# Patient Record
Sex: Male | Born: 1960 | Race: Black or African American | Hispanic: No | Smoking: Current every day smoker
Health system: Southern US, Community
[De-identification: ages and names within clinical notes are randomized; demographics above are authoritative.]

## PROBLEM LIST (undated history)

## (undated) DIAGNOSIS — J449 Chronic obstructive pulmonary disease, unspecified: Secondary | ICD-10-CM

## (undated) DIAGNOSIS — R000 Tachycardia, unspecified: Secondary | ICD-10-CM

## (undated) DIAGNOSIS — I10 Essential (primary) hypertension: Secondary | ICD-10-CM

## (undated) DIAGNOSIS — J189 Pneumonia, unspecified organism: Secondary | ICD-10-CM

## (undated) DIAGNOSIS — M539 Dorsopathy, unspecified: Secondary | ICD-10-CM

## (undated) DIAGNOSIS — K219 Gastro-esophageal reflux disease without esophagitis: Secondary | ICD-10-CM

## (undated) DIAGNOSIS — Z972 Presence of dental prosthetic device (complete) (partial): Secondary | ICD-10-CM

## (undated) DIAGNOSIS — G4730 Sleep apnea, unspecified: Secondary | ICD-10-CM

## (undated) DIAGNOSIS — C61 Malignant neoplasm of prostate: Secondary | ICD-10-CM

## (undated) DIAGNOSIS — N529 Male erectile dysfunction, unspecified: Secondary | ICD-10-CM

## (undated) DIAGNOSIS — M109 Gout, unspecified: Secondary | ICD-10-CM

## (undated) HISTORY — DX: Male erectile dysfunction, unspecified: N52.9

## (undated) HISTORY — DX: Malignant neoplasm of prostate: C61

## (undated) HISTORY — DX: Pneumonia, unspecified organism: J18.9

## (undated) HISTORY — DX: Chronic obstructive pulmonary disease, unspecified: J44.9

## (undated) MED FILL — Dexamethasone Sodium Phosphate Inj 100 MG/10ML: INTRAMUSCULAR | Qty: 1 | Status: AC

---

## 2007-07-24 HISTORY — PX: ABCESS DRAINAGE: SHX399

## 2008-05-04 ENCOUNTER — Emergency Department (HOSPITAL_COMMUNITY): Admission: EM | Admit: 2008-05-04 | Discharge: 2008-05-04 | Payer: Self-pay | Admitting: Emergency Medicine

## 2011-03-16 ENCOUNTER — Ambulatory Visit: Payer: Self-pay

## 2011-05-28 ENCOUNTER — Inpatient Hospital Stay: Payer: Self-pay | Admitting: Internal Medicine

## 2011-05-31 ENCOUNTER — Emergency Department: Payer: Self-pay | Admitting: *Deleted

## 2011-06-18 ENCOUNTER — Ambulatory Visit: Payer: Self-pay | Admitting: "Endocrinology

## 2011-09-06 ENCOUNTER — Emergency Department: Payer: Self-pay | Admitting: Emergency Medicine

## 2012-02-06 ENCOUNTER — Emergency Department: Payer: Self-pay | Admitting: Emergency Medicine

## 2012-10-03 ENCOUNTER — Emergency Department: Payer: Self-pay | Admitting: Emergency Medicine

## 2012-10-03 LAB — BASIC METABOLIC PANEL
Chloride: 106 mmol/L (ref 98–107)
Co2: 27 mmol/L (ref 21–32)
Osmolality: 277 (ref 275–301)
Sodium: 138 mmol/L (ref 136–145)

## 2012-10-03 LAB — URINALYSIS, COMPLETE
Bilirubin,UR: NEGATIVE
Nitrite: NEGATIVE
WBC UR: 13 /HPF (ref 0–5)

## 2012-10-03 LAB — CBC
HCT: 37.4 % — ABNORMAL LOW (ref 40.0–52.0)
HGB: 12.4 g/dL — ABNORMAL LOW (ref 13.0–18.0)
MCH: 25.9 pg — ABNORMAL LOW (ref 26.0–34.0)
RBC: 4.79 10*6/uL (ref 4.40–5.90)
RDW: 15.7 % — ABNORMAL HIGH (ref 11.5–14.5)

## 2012-10-03 LAB — LIPASE, BLOOD: Lipase: 149 U/L (ref 73–393)

## 2013-05-13 ENCOUNTER — Emergency Department: Payer: Self-pay | Admitting: Emergency Medicine

## 2013-05-13 LAB — URINALYSIS, COMPLETE
Bacteria: NONE SEEN
Glucose,UR: NEGATIVE mg/dL (ref 0–75)
Leukocyte Esterase: NEGATIVE
Ph: 5 (ref 4.5–8.0)
Protein: NEGATIVE
RBC,UR: 1 /HPF (ref 0–5)
WBC UR: NONE SEEN /HPF (ref 0–5)

## 2013-06-17 ENCOUNTER — Emergency Department: Payer: Self-pay | Admitting: Emergency Medicine

## 2013-06-17 LAB — CBC WITH DIFFERENTIAL/PLATELET
Eosinophil #: 0.2 10*3/uL (ref 0.0–0.7)
Eosinophil %: 1.9 %
HGB: 12.1 g/dL — ABNORMAL LOW (ref 13.0–18.0)
Lymphocyte #: 2.5 10*3/uL (ref 1.0–3.6)
Lymphocyte %: 25 %
MCV: 80 fL (ref 80–100)
Monocyte #: 0.7 x10 3/mm (ref 0.2–1.0)
Neutrophil #: 6.5 10*3/uL (ref 1.4–6.5)
RBC: 4.6 10*6/uL (ref 4.40–5.90)
WBC: 10 10*3/uL (ref 3.8–10.6)

## 2013-06-17 LAB — URIC ACID: Uric Acid: 6.2 mg/dL (ref 3.5–7.2)

## 2013-10-07 ENCOUNTER — Emergency Department: Payer: Self-pay | Admitting: Emergency Medicine

## 2013-10-09 ENCOUNTER — Emergency Department: Payer: Self-pay | Admitting: Emergency Medicine

## 2014-01-18 ENCOUNTER — Ambulatory Visit: Payer: Self-pay

## 2014-07-23 HISTORY — PX: SEPTOPLASTY: SUR1290

## 2016-12-27 DIAGNOSIS — I10 Essential (primary) hypertension: Secondary | ICD-10-CM | POA: Diagnosis present

## 2016-12-27 DIAGNOSIS — R000 Tachycardia, unspecified: Secondary | ICD-10-CM | POA: Insufficient documentation

## 2017-01-02 DIAGNOSIS — I369 Nonrheumatic tricuspid valve disorder, unspecified: Secondary | ICD-10-CM | POA: Insufficient documentation

## 2017-01-02 DIAGNOSIS — I059 Rheumatic mitral valve disease, unspecified: Secondary | ICD-10-CM | POA: Insufficient documentation

## 2017-01-02 DIAGNOSIS — I272 Pulmonary hypertension, unspecified: Secondary | ICD-10-CM | POA: Insufficient documentation

## 2017-03-13 DIAGNOSIS — G4733 Obstructive sleep apnea (adult) (pediatric): Secondary | ICD-10-CM | POA: Diagnosis present

## 2017-06-07 ENCOUNTER — Other Ambulatory Visit: Payer: Self-pay | Admitting: Otolaryngology

## 2017-06-07 DIAGNOSIS — L989 Disorder of the skin and subcutaneous tissue, unspecified: Secondary | ICD-10-CM

## 2017-06-14 ENCOUNTER — Ambulatory Visit: Payer: Self-pay

## 2017-06-17 ENCOUNTER — Other Ambulatory Visit: Payer: Self-pay | Admitting: Otolaryngology

## 2017-06-17 ENCOUNTER — Ambulatory Visit
Admission: RE | Admit: 2017-06-17 | Discharge: 2017-06-17 | Disposition: A | Discharge status: Home / Self Care | Payer: Medicaid Other | Source: Ambulatory Visit | Attending: Otolaryngology | Admitting: Otolaryngology

## 2017-06-17 ENCOUNTER — Inpatient Hospital Stay: Admission: RE | Admit: 2017-06-17 | Payer: Self-pay | Source: Ambulatory Visit | Admitting: Otolaryngology

## 2017-06-17 DIAGNOSIS — R221 Localized swelling, mass and lump, neck: Secondary | ICD-10-CM | POA: Insufficient documentation

## 2017-06-21 ENCOUNTER — Other Ambulatory Visit: Payer: Self-pay | Admitting: Otolaryngology

## 2017-06-21 ENCOUNTER — Ambulatory Visit
Admission: RE | Admit: 2017-06-21 | Discharge: 2017-06-21 | Disposition: A | Discharge status: Home / Self Care | Payer: Medicaid Other | Source: Ambulatory Visit | Attending: Otolaryngology | Admitting: Otolaryngology

## 2017-06-21 DIAGNOSIS — L728 Other follicular cysts of the skin and subcutaneous tissue: Secondary | ICD-10-CM | POA: Diagnosis not present

## 2017-06-21 DIAGNOSIS — L989 Disorder of the skin and subcutaneous tissue, unspecified: Secondary | ICD-10-CM | POA: Diagnosis not present

## 2017-06-21 MED ORDER — IOPAMIDOL (ISOVUE-300) INJECTION 61%
75.0000 mL | Freq: Once | INTRAVENOUS | Status: AC | PRN
Start: 2017-06-21 — End: 2017-06-21
  Administered 2017-06-21: 75 mL via INTRAVENOUS

## 2017-07-08 ENCOUNTER — Other Ambulatory Visit: Payer: Self-pay

## 2017-07-08 ENCOUNTER — Encounter: Payer: Self-pay | Admitting: Physical Therapy

## 2017-07-08 ENCOUNTER — Ambulatory Visit: Payer: Medicaid Other | Attending: Internal Medicine | Admitting: Physical Therapy

## 2017-07-08 DIAGNOSIS — M2560 Stiffness of unspecified joint, not elsewhere classified: Secondary | ICD-10-CM

## 2017-07-08 DIAGNOSIS — M542 Cervicalgia: Secondary | ICD-10-CM | POA: Diagnosis present

## 2017-07-08 DIAGNOSIS — R293 Abnormal posture: Secondary | ICD-10-CM

## 2017-07-08 DIAGNOSIS — M6281 Muscle weakness (generalized): Secondary | ICD-10-CM | POA: Diagnosis present
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
## 2017-07-15 ENCOUNTER — Encounter: Payer: Medicaid Other | Admitting: Physical Therapy

## 2017-07-18 ENCOUNTER — Other Ambulatory Visit: Payer: Self-pay | Admitting: Internal Medicine

## 2017-07-18 ENCOUNTER — Ambulatory Visit: Payer: Medicaid Other | Admitting: Physical Therapy

## 2017-07-18 ENCOUNTER — Encounter: Payer: Self-pay | Admitting: Physical Therapy

## 2017-07-18 DIAGNOSIS — M542 Cervicalgia: Secondary | ICD-10-CM | POA: Diagnosis not present

## 2017-07-18 DIAGNOSIS — R293 Abnormal posture: Secondary | ICD-10-CM

## 2017-07-18 DIAGNOSIS — M2560 Stiffness of unspecified joint, not elsewhere classified: Secondary | ICD-10-CM

## 2017-07-18 DIAGNOSIS — M6281 Muscle weakness (generalized): Secondary | ICD-10-CM

## 2017-07-21 NOTE — Therapy (Signed)
[facility] [address]. [place], [place], [address] Phone: [phone]   Fax:  [phone]  Physical Therapy Treatment  Patient Details  Name: Clarence Skinner MRN: [id number] Date of Birth: 19-Apr-1961 Referring Provider: Dr. Elijio Miles   Encounter Date: 07/18/2017    Treatment 2 of 4.  Recert 5/46/27.   History reviewed. No pertinent past medical history.  History reviewed. No pertinent surgical history.  There were no vitals filed for this visit.    Pt. reports no neck pain today.  Pt. returns back to MD next week (07/26/17) after having MRI on 07/25/17.          Treatment:  There.ex.:  Reviewed HEP.  Reassessed cervical AROM all planes and posture correction.   Manual tx.:  Supine UT/levator/ rotn. Stretches 4x20 sec. Each.  Prone cervical/thoracic UPA/CPA grade II-III mobs. 2x20 sec. Each level.  STM to upper thoracic/cervical paraspinals. MH to upper back/neck during prone tx. (alternating positions).     No changes to HEP at this time.       Pt. presents with marked increase in cervical ROM (all planes) since initial PT evaluation.  Pt. received an antiinflammatory injection the other day and is on Prednisone (reported per pt.).  Pt. is also scheduled to receive MRI next week and return to MD.  1 more PT visit scheduled prior to imaging.  Pt. instructed to continue with daily stretching/ HEP at this time.      PT Long Term Goals - 07/09/17 1303      PT LONG TERM GOAL #1   Title  Pt. will increase cervical AROM 25% in pain tolerable range to improve pain-free mobility/ upright posture.     Baseline  Pt. presents with decrease cervical AROM:  flexion (26 deg.), ext. (38 deg.), R rotn. (55 deg.), L rotn. (55 deg.), R lateral flex. (32 deg.)- increase R UT pain, L lat. flex. (40 deg.).     Time  4    Period  Weeks    Status  New    Target Date  08/05/17      PT LONG TERM GOAL #2   Title  Pt. will report <3/10  neck pain at work with cervical rotn./ lateral flexion to improve ability to look over shoulder while driving.      Baseline  Increase neck pain with rotn., esp. on L side of neck     Time  4    Period  Weeks    Status  New    Target Date  08/05/17      PT LONG TERM GOAL #3   Title  Pt. will decrease NDI to <30% to improve self-perceived disability.      Baseline  NDI on 12/17: 52% self-perceived severe disability.      Time  4    Period  Weeks    Status  New    Target Date  08/05/17      PT LONG TERM GOAL #4   Title  Pt. will be able to tolerate sitting upright for 10 minutes with no increase c/o neck pain.    Baseline  Pt. reports increase neck pain with sitting unsupported for >1 minute during evaluation    Time  4    Period  Weeks    Status  New    Target Date  08/05/17            Plan - 07/21/17 1347    Clinical Impression Statement  Pt. presents with marked increase in cervical ROM (all planes) since initial PT evaluation.  Pt. received an antiinflammatory injection the other day and is on Prednisone (reported per pt.).  Pt. is also scheduled to receive MRI next week and return to MD.  1 more PT visit scheduled prior to imaging.  Pt. instructed to continue with daily stretching/ HEP at this time.      Clinical Presentation  Evolving    Clinical Decision Making  Moderate    Rehab Potential  Fair    PT Frequency  1x / week    PT Duration  4 weeks    PT Treatment/Interventions  ADLs/Self Care Home Management;Cryotherapy;Electrical Stimulation;Moist Heat;Functional mobility training;Therapeutic activities;Therapeutic exercise;Patient/family education;Neuromuscular re-education;Manual techniques;Dry needling;Passive range of motion    PT Next Visit Plan  Progress HEP.  Send MD progress note.      PT Home Exercise Plan  see handouts       Patient will benefit from skilled therapeutic intervention in order to improve the following deficits and impairments:  Pain, Improper  body mechanics, Postural dysfunction, Decreased mobility, Decreased activity tolerance, Decreased range of motion, Decreased strength, Hypomobility, Impaired flexibility  Visit Diagnosis: Cervicalgia  Joint stiffness  Muscle weakness (generalized)  Abnormal posture     Problem List There are no active problems to display for this patient.  Pura Spice, PT, DPT # [phone] 07/21/2017, 1:53 PM  [place] [facility] [address] [place], [place], [address] Phone: [phone]   Fax:  [phone]  Name: Clarence Skinner MRN: [id number] Date of Birth: 07/29/1960

## 2017-07-22 ENCOUNTER — Encounter: Payer: Self-pay | Admitting: Physical Therapy

## 2017-07-22 ENCOUNTER — Ambulatory Visit: Payer: Medicaid Other | Admitting: Physical Therapy

## 2017-07-22 DIAGNOSIS — M542 Cervicalgia: Secondary | ICD-10-CM | POA: Diagnosis not present

## 2017-07-22 DIAGNOSIS — M2560 Stiffness of unspecified joint, not elsewhere classified: Secondary | ICD-10-CM

## 2017-07-22 DIAGNOSIS — M6281 Muscle weakness (generalized): Secondary | ICD-10-CM

## 2017-07-22 DIAGNOSIS — R293 Abnormal posture: Secondary | ICD-10-CM
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
## 2017-07-25 ENCOUNTER — Ambulatory Visit: Payer: Medicaid Other

## 2017-08-02 ENCOUNTER — Ambulatory Visit
Admission: RE | Admit: 2017-08-02 | Discharge: 2017-08-02 | Disposition: A | Discharge status: Home / Self Care | Payer: Medicaid Other | Source: Ambulatory Visit | Attending: Internal Medicine | Admitting: Internal Medicine

## 2017-08-02 DIAGNOSIS — M47812 Spondylosis without myelopathy or radiculopathy, cervical region: Secondary | ICD-10-CM | POA: Insufficient documentation

## 2017-08-02 DIAGNOSIS — M542 Cervicalgia: Secondary | ICD-10-CM | POA: Diagnosis present

## 2017-08-02 DIAGNOSIS — M50221 Other cervical disc displacement at C4-C5 level: Secondary | ICD-10-CM | POA: Insufficient documentation

## 2017-08-02 DIAGNOSIS — M4802 Spinal stenosis, cervical region: Secondary | ICD-10-CM | POA: Insufficient documentation

## 2017-08-07 ENCOUNTER — Other Ambulatory Visit: Payer: Self-pay

## 2017-08-07 ENCOUNTER — Encounter: Payer: Self-pay | Admitting: *Deleted

## 2017-08-13 NOTE — Discharge Instructions (Signed)
General Anesthesia, Adult, Care After °These instructions provide you with information about caring for yourself after your procedure. Your health care provider may also give you more specific instructions. Your treatment has been planned according to current medical practices, but problems sometimes occur. Call your health care provider if you have any problems or questions after your procedure. °What can I expect after the procedure? °After the procedure, it is common to have: °· Vomiting. °· A sore throat. °· Mental slowness. ° °It is common to feel: °· Nauseous. °· Cold or shivery. °· Sleepy. °· Tired. °· Sore or achy, even in parts of your body where you did not have surgery. ° °Follow these instructions at home: °For at least 24 hours after the procedure: °· Do not: °? Participate in activities where you could fall or become injured. °? Drive. °? Use heavy machinery. °? Drink alcohol. °? Take sleeping pills or medicines that cause drowsiness. °? Make important decisions or sign legal documents. °? Take care of children on your own. °· Rest. °Eating and drinking °· If you vomit, drink water, juice, or soup when you can drink without vomiting. °· Drink enough fluid to keep your urine clear or pale yellow. °· Make sure you have little or no nausea before eating solid foods. °· Follow the diet recommended by your health care provider. °General instructions °· Have a responsible adult stay with you until you are awake and alert. °· Return to your normal activities as told by your health care provider. Ask your health care provider what activities are safe for you. °· Take over-the-counter and prescription medicines only as told by your health care provider. °· If you smoke, do not smoke without supervision. °· Keep all follow-up visits as told by your health care provider. This is important. °Contact a health care provider if: °· You continue to have nausea or vomiting at home, and medicines are not helpful. °· You  cannot drink fluids or start eating again. °· You cannot urinate after 8-12 hours. °· You develop a skin rash. °· You have fever. °· You have increasing redness at the site of your procedure. °Get help right away if: °· You have difficulty breathing. °· You have chest pain. °· You have unexpected bleeding. °· You feel that you are having a life-threatening or urgent problem. °This information is not intended to replace advice given to you by your health care provider. Make sure you discuss any questions you have with your health care provider. °Document Released: 10/15/2000 Document Revised: 12/12/2015 Document Reviewed: 06/23/2015 °Elsevier Interactive Patient Education © 2018 [employer] ° °

## 2017-08-14 ENCOUNTER — Encounter
Admission: RE | Disposition: A | Discharge status: Home / Self Care | Payer: Self-pay | Source: Ambulatory Visit | Attending: Otolaryngology

## 2017-08-14 ENCOUNTER — Ambulatory Visit: Payer: Medicaid Other | Admitting: Anesthesiology

## 2017-08-14 ENCOUNTER — Ambulatory Visit
Admission: RE | Admit: 2017-08-14 | Discharge: 2017-08-14 | Disposition: A | Discharge status: Home / Self Care | Payer: Medicaid Other | Source: Ambulatory Visit | Attending: Otolaryngology | Admitting: Otolaryngology

## 2017-08-14 DIAGNOSIS — I10 Essential (primary) hypertension: Secondary | ICD-10-CM | POA: Insufficient documentation

## 2017-08-14 DIAGNOSIS — K219 Gastro-esophageal reflux disease without esophagitis: Secondary | ICD-10-CM | POA: Diagnosis not present

## 2017-08-14 DIAGNOSIS — F17200 Nicotine dependence, unspecified, uncomplicated: Secondary | ICD-10-CM | POA: Diagnosis not present

## 2017-08-14 DIAGNOSIS — D170 Benign lipomatous neoplasm of skin and subcutaneous tissue of head, face and neck: Secondary | ICD-10-CM | POA: Insufficient documentation

## 2017-08-14 DIAGNOSIS — M1990 Unspecified osteoarthritis, unspecified site: Secondary | ICD-10-CM | POA: Diagnosis not present

## 2017-08-14 DIAGNOSIS — J449 Chronic obstructive pulmonary disease, unspecified: Secondary | ICD-10-CM | POA: Diagnosis not present

## 2017-08-14 DIAGNOSIS — M109 Gout, unspecified: Secondary | ICD-10-CM | POA: Insufficient documentation

## 2017-08-14 DIAGNOSIS — R220 Localized swelling, mass and lump, head: Secondary | ICD-10-CM | POA: Diagnosis present

## 2017-08-14 DIAGNOSIS — G4733 Obstructive sleep apnea (adult) (pediatric): Secondary | ICD-10-CM | POA: Diagnosis not present

## 2017-08-14 HISTORY — DX: Presence of dental prosthetic device (complete) (partial): Z97.2

## 2017-08-14 HISTORY — DX: Gout, unspecified: M10.9

## 2017-08-14 HISTORY — DX: Gastro-esophageal reflux disease without esophagitis: K21.9

## 2017-08-14 HISTORY — PX: MASS EXCISION: SHX2000

## 2017-08-14 HISTORY — DX: Dorsopathy, unspecified: M53.9

## 2017-08-14 HISTORY — DX: Essential (primary) hypertension: I10

## 2017-08-14 HISTORY — DX: Tachycardia, unspecified: R00.0

## 2017-08-14 HISTORY — DX: Sleep apnea, unspecified: G47.30

## 2017-08-14 SURGERY — EXCISION MASS
Anesthesia: General | Laterality: Right | Wound class: Clean

## 2017-08-14 MED ORDER — LIDOCAINE HCL (PF) 1 % IJ SOLN
INTRAMUSCULAR | Status: DC | PRN
  Administered 2017-08-14: 3 mL

## 2017-08-14 MED ORDER — DEXAMETHASONE SODIUM PHOSPHATE 4 MG/ML IJ SOLN
INTRAMUSCULAR | Status: DC | PRN
  Administered 2017-08-14: 10 mg via INTRAVENOUS

## 2017-08-14 MED ORDER — LACTATED RINGERS IV SOLN
INTRAVENOUS | Status: DC
  Administered 2017-08-14: 09:00:00 via INTRAVENOUS

## 2017-08-14 MED ORDER — LACTATED RINGERS IV SOLN: 500.0000 mL | INTRAVENOUS | Status: DC

## 2017-08-14 MED ORDER — IBUPROFEN 100 MG/5ML PO SUSP: 200.0000 mg | Freq: Four times a day (QID) | ORAL | Status: DC | PRN

## 2017-08-14 MED ORDER — SUCCINYLCHOLINE CHLORIDE 20 MG/ML IJ SOLN
INTRAMUSCULAR | Status: DC | PRN
  Administered 2017-08-14: 100 mg via INTRAVENOUS

## 2017-08-14 MED ORDER — ONDANSETRON HCL 4 MG PO TABS: 4.0000 mg | ORAL_TABLET | Freq: Three times a day (TID) | ORAL | 0 refills | Status: DC | PRN

## 2017-08-14 MED ORDER — FENTANYL CITRATE (PF) 100 MCG/2ML IJ SOLN: 25.0000 ug | INTRAMUSCULAR | Status: DC | PRN

## 2017-08-14 MED ORDER — MIDAZOLAM HCL 5 MG/5ML IJ SOLN
INTRAMUSCULAR | Status: DC | PRN
  Administered 2017-08-14: 2 mg via INTRAVENOUS

## 2017-08-14 MED ORDER — FENTANYL CITRATE (PF) 100 MCG/2ML IJ SOLN
INTRAMUSCULAR | Status: DC | PRN
  Administered 2017-08-14: 100 ug via INTRAVENOUS

## 2017-08-14 MED ORDER — LIDOCAINE HCL (CARDIAC) 20 MG/ML IV SOLN
INTRAVENOUS | Status: DC | PRN
  Administered 2017-08-14: 40 mg via INTRAVENOUS

## 2017-08-14 MED ORDER — LIDOCAINE HCL 4 % MT SOLN
OROMUCOSAL | Status: DC | PRN
  Administered 2017-08-14: 4 mL via TOPICAL

## 2017-08-14 MED ORDER — GLYCOPYRROLATE 0.2 MG/ML IJ SOLN
INTRAMUSCULAR | Status: DC | PRN
  Administered 2017-08-14: 0.1 mg via INTRAVENOUS

## 2017-08-14 MED ORDER — ONDANSETRON HCL 4 MG/2ML IJ SOLN
INTRAMUSCULAR | Status: DC | PRN
  Administered 2017-08-14: 4 mg via INTRAVENOUS

## 2017-08-14 MED ORDER — HYDROCODONE-ACETAMINOPHEN 5-325 MG PO TABS: 2.0000 | ORAL_TABLET | Freq: Four times a day (QID) | ORAL | 0 refills | Status: DC | PRN

## 2017-08-14 MED ORDER — BACITRACIN 500 UNIT/GM EX OINT
TOPICAL_OINTMENT | CUTANEOUS | Status: DC | PRN
  Administered 2017-08-14: 1 via TOPICAL

## 2017-08-14 MED ORDER — OXYCODONE HCL 5 MG/5ML PO SOLN: 5.0000 mg | Freq: Once | ORAL | Status: DC | PRN

## 2017-08-14 MED ORDER — IBUPROFEN 200 MG PO TABS: 200.0000 mg | ORAL_TABLET | Freq: Four times a day (QID) | ORAL | Status: DC | PRN

## 2017-08-14 MED ORDER — ONDANSETRON HCL 4 MG/2ML IJ SOLN: 4.0000 mg | Freq: Once | INTRAMUSCULAR | Status: DC | PRN

## 2017-08-14 MED ORDER — OXYCODONE HCL 5 MG PO TABS: 5.0000 mg | ORAL_TABLET | Freq: Once | ORAL | Status: DC | PRN

## 2017-08-14 MED ORDER — ACETAMINOPHEN 10 MG/ML IV SOLN
1000.0000 mg | Freq: Once | INTRAVENOUS | Status: AC
  Administered 2017-08-14: 1000 mg via INTRAVENOUS

## 2017-08-14 MED ORDER — PROPOFOL 10 MG/ML IV BOLUS
INTRAVENOUS | Status: DC | PRN
  Administered 2017-08-14: 150 mg via INTRAVENOUS

## 2017-08-14 SURGICAL SUPPLY — 31 items
BLADE SURG 15 STRL LF DISP TIS (BLADE) IMPLANT
BLADE SURG 15 STRL SS (BLADE)
CORD BIP STRL DISP 12FT (MISCELLANEOUS) IMPLANT
DRAPE HEAD BAR (DRAPES) ×2 IMPLANT
DRESSING TELFA 4X3 1S ST N-ADH (GAUZE/BANDAGES/DRESSINGS) IMPLANT
ELECT CAUTERY BLADE TIP 2.5 (TIP)
ELECT CAUTERY NEEDLE 2.0 MIC (NEEDLE) ×2 IMPLANT
ELECT REM PT RETURN 9FT ADLT (ELECTROSURGICAL) ×2
ELECTRODE CAUTERY BLDE TIP 2.5 (TIP) IMPLANT
ELECTRODE REM PT RTRN 9FT ADLT (ELECTROSURGICAL) ×1 IMPLANT
GAUZE SPONGE 4X4 12PLY STRL (GAUZE/BANDAGES/DRESSINGS) IMPLANT
GLOVE BIO SURGEON STRL SZ7.5 (GLOVE) ×4 IMPLANT
KIT RM TURNOVER STRD PROC AR (KITS) ×2 IMPLANT
LIQUID BAND (GAUZE/BANDAGES/DRESSINGS) IMPLANT
NEEDLE HYPO 25GX1X1/2 BEV (NEEDLE) ×2 IMPLANT
NS IRRIG 500ML POUR BTL (IV SOLUTION) ×2 IMPLANT
PACK DRAPE NASAL/ENT (PACKS) ×2 IMPLANT
PENCIL ELECTRO HAND CTR (MISCELLANEOUS) ×2 IMPLANT
SOL PREP PVP 2OZ (MISCELLANEOUS) ×2
SOLUTION PREP PVP 2OZ (MISCELLANEOUS) ×1 IMPLANT
STRAP BODY AND KNEE 60X3 (MISCELLANEOUS) ×2 IMPLANT
SUCTION FRAZIER TIP 10 FR DISP (SUCTIONS) IMPLANT
SUT PROLENE 4 0 PS 2 18 (SUTURE) ×2 IMPLANT
SUT PROLENE 5 0 P 3 (SUTURE) IMPLANT
SUT SILK 2 0 (SUTURE) ×1
SUT SILK 2-0 18XBRD TIE 12 (SUTURE) ×1 IMPLANT
SUT SILK 3 0 (SUTURE) ×1
SUT SILK 3-0 18XBRD TIE 12 (SUTURE) ×1 IMPLANT
SUT VIC AB 4-0 RB1 27 (SUTURE) ×1
SUT VIC AB 4-0 RB1 27X BRD (SUTURE) ×1 IMPLANT
SYRINGE 10CC LL (SYRINGE) ×2 IMPLANT

## 2017-08-14 NOTE — Anesthesia Preprocedure Evaluation (Signed)
Anesthesia Evaluation  Patient identified by MRN, date of birth, ID band Patient awake    Reviewed: Allergy & Precautions, H&P , NPO status , Patient's Chart, lab work & pertinent test results, reviewed documented beta blocker date and time   Airway Mallampati: II  TM Distance: >3 FB Neck ROM: full    Dental no notable dental hx.    Pulmonary sleep apnea , Current Smoker,    Pulmonary exam normal breath sounds clear to auscultation       Cardiovascular Exercise Tolerance: Good hypertension, negative cardio ROS   Rhythm:regular Rate:Normal     Neuro/Psych negative neurological ROS  negative psych ROS   GI/Hepatic Neg liver ROS, GERD  Medicated and Controlled,  Endo/Other  negative endocrine ROS  Renal/GU negative Renal ROS  negative genitourinary   Musculoskeletal   Abdominal   Peds  Hematology negative hematology ROS (+)   Anesthesia Other Findings   Reproductive/Obstetrics negative OB ROS                             Anesthesia Physical Anesthesia Plan  ASA: II  Anesthesia Plan: General   Post-op Pain Management:    Induction:   PONV Risk Score and Plan: 1 and Ondansetron, Dexamethasone and Midazolam  Airway Management Planned:   Additional Equipment:   Intra-op Plan:   Post-operative Plan:   Informed Consent: I have reviewed the patients History and Physical, chart, labs and discussed the procedure including the risks, benefits and alternatives for the proposed anesthesia with the patient or authorized representative who has indicated his/her understanding and acceptance.     Plan Discussed with: CRNA  Anesthesia Plan Comments:         Anesthesia Quick Evaluation

## 2017-08-14 NOTE — Op Note (Signed)
..  08/14/2017  11:29 AM    Clarence Skinner  [id number]   Pre-Op Dx: right Fronto-tEMPORAL MASS, left SUBMENTAL MASS  Post-op Dx: same  Proc: 1)  Right Fronto-temporal mass excision  2)  Left submental mass excision  Surg: Clarence Skinner  Anes:  GOT  EBL: 24ml  Comp:  none  Findings:  Subgaleal lipoma of right fronto-temporal region, left submental epidermoid inclusion cyst  Procedure:  After the patient was identified in hold and the history and physical and consent was reviewed and updated. The patient was marked in the normal fashion in an existing skin crease of his right fronto-temporal region on top of the patient's mass.  The left submental mass was also marked on the skin pore in an elliptical fashion. The patient was next taken to the operating room and placed in a supine position. General endotracheal anesthesia was induced in the normal fashion. The patient's right fronto-temporal region was nexk injected with 2cc's of 1% lidocaine with 1:100,000 Epinephrine and 76ml in the submental region on the left. The patient was next prepped and draped in a sterile normal fashion.  At this time, a 15 blade scalpel was used to make a skin incision along the previously marked neck crease in the right fronto-temporal region after the proper time out was performed. Dissection was carefully performed through the subcutaneous tissues with combination of sharp and blunt dissection.  The frontalis muscle was encountered and divided bluntly in a vertical fashion.  A subgaleal mass was noted and the galea was incised sharply.  This demonstrated a lipomatous lesion that was circumferentially bluntly dissected with blunt scissors from the underlying periosteum.  This was removed enbloc and sent for permanent pathological evaluation.  Meticulous hemostasis was achieved with bipolar forceps.  The wound was then closed in a multilayered fashion with vicryl for subcutaneous tissues and 4.0 Prolene  in a running locking fashion the cutaneous skin and topped with bacitracin.  At this time, attention was directed to the left submental region.  An elliptical incision was made over the previously marked area with a 15 blade scalpel.  Dissection was carried down through scar tissue to the inclusion cyst.  A combination of scare and cytic structure was removed in conjunction with overlying attached skin with blunt and sharp techniques.  At this time, the wound was copiously irrigated with sterile saline. Meticulous hemostasis with bipolar was obtained. . The wound was then closed in a multilayered fashion with vicryl for subcutaneous tissues and 4.0 Prolene in a running locking fashion the cutaneous skin and topped with bacitracin.   At this time the patient was extubated and taken to PACU in good condition.    Dispo: PACU in good condition.  Plan:  Ice, elevation, analgesia.  Clarence Skinner 08/14/2017 11:29 AM

## 2017-08-14 NOTE — Anesthesia Procedure Notes (Signed)
Procedure Name: Intubation Date/Time: 08/14/2017 10:40 AM Performed by: Mayme Genta, CRNA Pre-anesthesia Checklist: Patient identified, Emergency Drugs available, Suction available, Patient being monitored and Timeout performed Patient Re-evaluated:Patient Re-evaluated prior to induction Oxygen Delivery Method: Circle system utilized Preoxygenation: Pre-oxygenation with 100% oxygen Induction Type: IV induction Ventilation: Mask ventilation without difficulty Laryngoscope Size: Miller and 3 Grade View: Grade I Tube type: Oral Rae Tube size: 7.5 mm Number of attempts: 1 Placement Confirmation: ETT inserted through vocal cords under direct vision,  positive ETCO2 and breath sounds checked- equal and bilateral Tube secured with: Tape Dental Injury: Teeth and Oropharynx as per pre-operative assessment

## 2017-08-14 NOTE — Transfer of Care (Signed)
Immediate Anesthesia Transfer of Care Note  Patient: Clarence Skinner  Procedure(s) Performed: EXCISION RIGHT TEMPORAL MASS SUBMENTAL MASS (Right )  Patient Location: PACU  Anesthesia Type: General  Level of Consciousness: awake, alert  and patient cooperative  Airway and Oxygen Therapy: Patient Spontanous Breathing and Patient connected to supplemental oxygen  Post-op Assessment: Post-op Vital signs reviewed, Patient's Cardiovascular Status Stable, Respiratory Function Stable, Patent Airway and No signs of Nausea or vomiting  Post-op Vital Signs: Reviewed and stable  Complications: No apparent anesthesia complications

## 2017-08-14 NOTE — Anesthesia Postprocedure Evaluation (Signed)
Anesthesia Post Note  Patient: Animal nutritionist  Procedure(s) Performed: EXCISION RIGHT TEMPORAL MASS SUBMENTAL MASS (Right )  Patient location during evaluation: PACU Anesthesia Type: General Level of consciousness: awake and alert Pain management: pain level controlled Vital Signs Assessment: post-procedure vital signs reviewed and stable Respiratory status: spontaneous breathing, nonlabored ventilation, respiratory function stable and patient connected to nasal cannula oxygen Cardiovascular status: blood pressure returned to baseline and stable Postop Assessment: no apparent nausea or vomiting Anesthetic complications: no    DANIEL D KOVACS

## 2017-08-14 NOTE — H&P (Signed)
..  History and Physical paper copy reviewed and updated date of procedure and will be scanned into system.  Patient seen and examined.  

## 2017-08-16 LAB — SURGICAL PATHOLOGY

## 2017-08-27 DIAGNOSIS — M47812 Spondylosis without myelopathy or radiculopathy, cervical region: Secondary | ICD-10-CM | POA: Insufficient documentation

## 2017-08-27 DIAGNOSIS — M542 Cervicalgia: Secondary | ICD-10-CM | POA: Diagnosis present

## 2017-11-06 DIAGNOSIS — N529 Male erectile dysfunction, unspecified: Secondary | ICD-10-CM | POA: Insufficient documentation

## 2017-11-06 DIAGNOSIS — F17200 Nicotine dependence, unspecified, uncomplicated: Secondary | ICD-10-CM | POA: Insufficient documentation

## 2017-11-29 ENCOUNTER — Emergency Department: Payer: Medicaid Other

## 2017-11-29 ENCOUNTER — Emergency Department
Admission: EM | Admit: 2017-11-29 | Discharge: 2017-11-29 | Disposition: A | Discharge status: Home / Self Care | Payer: Medicaid Other | Attending: Emergency Medicine | Admitting: Emergency Medicine

## 2017-11-29 ENCOUNTER — Encounter: Payer: Self-pay | Admitting: Emergency Medicine

## 2017-11-29 ENCOUNTER — Other Ambulatory Visit: Payer: Self-pay

## 2017-11-29 DIAGNOSIS — J189 Pneumonia, unspecified organism: Secondary | ICD-10-CM

## 2017-11-29 DIAGNOSIS — Z79899 Other long term (current) drug therapy: Secondary | ICD-10-CM | POA: Diagnosis not present

## 2017-11-29 DIAGNOSIS — F17210 Nicotine dependence, cigarettes, uncomplicated: Secondary | ICD-10-CM | POA: Insufficient documentation

## 2017-11-29 DIAGNOSIS — R918 Other nonspecific abnormal finding of lung field: Secondary | ICD-10-CM | POA: Diagnosis not present

## 2017-11-29 DIAGNOSIS — J449 Chronic obstructive pulmonary disease, unspecified: Secondary | ICD-10-CM | POA: Insufficient documentation

## 2017-11-29 DIAGNOSIS — I10 Essential (primary) hypertension: Secondary | ICD-10-CM | POA: Insufficient documentation

## 2017-11-29 DIAGNOSIS — J181 Lobar pneumonia, unspecified organism: Secondary | ICD-10-CM | POA: Insufficient documentation

## 2017-11-29 DIAGNOSIS — R0789 Other chest pain: Secondary | ICD-10-CM | POA: Diagnosis present

## 2017-11-29 HISTORY — DX: Pneumonia, unspecified organism: J18.9

## 2017-11-29 LAB — CBC WITH DIFFERENTIAL/PLATELET
Basophils Absolute: 0.1 10*3/uL (ref 0–0.1)
Basophils Relative: 1 %
EOS ABS: 0.2 10*3/uL (ref 0–0.7)
EOS PCT: 2 %
HCT: 40 % (ref 40.0–52.0)
Hemoglobin: 12.8 g/dL — ABNORMAL LOW (ref 13.0–18.0)
LYMPHS ABS: 2.4 10*3/uL (ref 1.0–3.6)
Lymphocytes Relative: 22 %
MCH: 24.2 pg — AB (ref 26.0–34.0)
MCHC: 32.1 g/dL (ref 32.0–36.0)
MCV: 75.6 fL — ABNORMAL LOW (ref 80.0–100.0)
MONOS PCT: 7 %
Monocytes Absolute: 0.8 10*3/uL (ref 0.2–1.0)
Neutro Abs: 7.4 10*3/uL — ABNORMAL HIGH (ref 1.4–6.5)
Neutrophils Relative %: 68 %
PLATELETS: 381 10*3/uL (ref 150–440)
RBC: 5.29 MIL/uL (ref 4.40–5.90)
RDW: 17.9 % — AB (ref 11.5–14.5)
WBC: 10.9 10*3/uL — ABNORMAL HIGH (ref 3.8–10.6)

## 2017-11-29 LAB — BASIC METABOLIC PANEL
Anion gap: 5 (ref 5–15)
BUN: 12 mg/dL (ref 6–20)
CALCIUM: 8.7 mg/dL — AB (ref 8.9–10.3)
CO2: 24 mmol/L (ref 22–32)
CREATININE: 0.97 mg/dL (ref 0.61–1.24)
Chloride: 106 mmol/L (ref 101–111)
GFR calc non Af Amer: >60 mL/min (ref >60)
Glucose, Bld: 101 mg/dL — ABNORMAL HIGH (ref 65–99)
Potassium: 4 mmol/L (ref 3.5–5.1)
Sodium: 135 mmol/L (ref 135–145)

## 2017-11-29 LAB — TROPONIN I: Troponin I: <0.03 ng/mL (ref <0.03)

## 2017-11-29 MED ORDER — IPRATROPIUM-ALBUTEROL 0.5-2.5 (3) MG/3ML IN SOLN
3.0000 mL | Freq: Once | RESPIRATORY_TRACT | Status: AC
  Administered 2017-11-29: 3 mL via RESPIRATORY_TRACT
  Filled 2017-11-29: qty 3

## 2017-11-29 MED ORDER — DOXYCYCLINE HYCLATE 100 MG PO TABS
100.0000 mg | ORAL_TABLET | Freq: Once | ORAL | Status: AC
  Administered 2017-11-29: 100 mg via ORAL

## 2017-11-29 MED ORDER — IOPAMIDOL (ISOVUE-370) INJECTION 76%
75.0000 mL | Freq: Once | INTRAVENOUS | Status: AC | PRN
  Administered 2017-11-29: 75 mL via INTRAVENOUS

## 2017-11-29 MED ORDER — ACETAMINOPHEN 500 MG PO TABS
1000.0000 mg | ORAL_TABLET | Freq: Once | ORAL | Status: AC
  Administered 2017-11-29: 1000 mg via ORAL
  Filled 2017-11-29: qty 2

## 2017-11-29 MED ORDER — METHYLPREDNISOLONE SODIUM SUCC 125 MG IJ SOLR
125.0000 mg | Freq: Once | INTRAMUSCULAR | Status: AC
  Administered 2017-11-29: 125 mg via INTRAVENOUS
  Filled 2017-11-29: qty 2

## 2017-11-29 MED ORDER — DOXYCYCLINE HYCLATE 100 MG PO CAPS: 100.0000 mg | ORAL_CAPSULE | Freq: Two times a day (BID) | ORAL | 0 refills | Status: DC

## 2017-11-29 MED ORDER — DOXYCYCLINE HYCLATE 100 MG PO TABS
ORAL_TABLET | ORAL | Status: AC
  Administered 2017-11-29: 100 mg via ORAL
  Filled 2017-11-29: qty 1

## 2017-11-29 MED ORDER — OXYCODONE-ACETAMINOPHEN 5-325 MG PO TABS
1.0000 | ORAL_TABLET | Freq: Once | ORAL | Status: AC
  Administered 2017-11-29: 1 via ORAL

## 2017-11-29 MED ORDER — OXYCODONE-ACETAMINOPHEN 5-325 MG PO TABS: 1.0000 | ORAL_TABLET | ORAL | 0 refills | Status: DC | PRN

## 2017-11-29 MED ORDER — OXYCODONE-ACETAMINOPHEN 5-325 MG PO TABS
ORAL_TABLET | ORAL | Status: AC
  Administered 2017-11-29: 1 via ORAL
  Filled 2017-11-29: qty 1

## 2017-11-29 NOTE — ED Notes (Signed)
Patient on stretcher in nad.  Says he has not gone to ct yet.  Family member at bedside.

## 2017-11-29 NOTE — ED Triage Notes (Signed)
Patient complaining of left sided chest pain "all the way to my back".  States pain began around midnight last night.  Has had similar pain on the right side in the past.  Patient has a hx of COPD and HTN.  Alert and oriented.  Skin warm and dry.  Ekg performed.

## 2017-11-29 NOTE — Discharge Instructions (Addendum)
As I explained to you, your CT scan showed a mass in your left lung which could be due to pneumonia but also there is a possibility that this might be lung cancer.  Therefore it is very important that you follow-up with the oncologist because if this is cancer you will need treatment before the cancer can spread and become too late to be treated.  Make sure to call Dr. Elroy Channel office today for an appointment within a week.  In the meantime, I will put you on antibiotics in case this is pneumonia.  Please take the antibiotics twice a day for 7 days as prescribed.

## 2017-11-29 NOTE — ED Provider Notes (Signed)
[facility] Emergency Department Provider Note  ____________________________________________  Time seen: Approximately 7:46 AM  I have reviewed the triage vital signs and the nursing notes.   HISTORY  Chief Complaint Chest Pain   HPI Clarence Skinner is a 57 y.o. male with a history of smoking, sleep apnea on CPAP, COPD, and hypertension who presents for evaluation of left-sided chest pain.  Patient reports that the pain started at 11:30 PM last night while he was laying in bed.  The pain is sharp, located in his left upper back radiating across his chest.  He reports that the pain goes away when he sits still with shallow breathing and not moving but it gets significantly worse with deep inspiration or with movement.  He reports no changes in his chronic cough.  No fever or chills.  Patient reports having similar pain in the past in the setting of pneumonia. Pain is associated with SOB. Inhalers have not improved the pain.  Patient denies weakness, numbness, paresthesias of his extremities.  He denies headache.  Patient denies personal family history of blood clots, recent travel immobilization, leg pain or swelling, hemoptysis, exogenous hormones, or history of cancer.  Past Medical History:  Diagnosis Date  . GERD (gastroesophageal reflux disease)   . Gout   . Hypertension   . Multilevel degenerative disc disease   . Sleep apnea    CPAP  . Tachycardia   . Wears dentures    partial upper and lower    Past Surgical History:  Procedure Laterality Date  . ABCESS DRAINAGE  2009   tonsil  . MASS EXCISION Right 08/14/2017   Procedure: EXCISION RIGHT TEMPORAL MASS SUBMENTAL MASS;  Surgeon: Carloyn Manner, MD;  Location: [place];  Service: ENT;  Laterality: Right;  sleep apnea  . SEPTOPLASTY  2016   [place], [place]    Prior to Admission medications   Medication Sig Start Date End Date Taking? Authorizing Provider  albuterol (PROVENTIL  HFA;VENTOLIN HFA) 108 (90 Base) MCG/ACT inhaler Inhale into the lungs every 6 (six) hours as needed for wheezing or shortness of breath.    [provider]  allopurinol (ZYLOPRIM) 300 MG tablet Take 300 mg by mouth daily.    [provider]  amLODipine-benazepril (LOTREL) 10-40 MG capsule Take 1 capsule by mouth daily.    [provider]  Azelastine HCl 137 MCG/SPRAY SOLN Place into the nose daily.    [provider]  baclofen (LIORESAL) 10 MG tablet Take 10 mg by mouth 2 (two) times daily.    [provider]  doxycycline (VIBRAMYCIN) 100 MG capsule Take 1 capsule (100 mg total) by mouth 2 (two) times daily for 7 days. 11/29/17 12/06/17  Rudene Re, MD  fluticasone [facility]) 50 MCG/ACT nasal spray Place into both nostrils daily as needed for allergies or rhinitis.    [provider]  fluticasone (FLOVENT HFA) 220 MCG/ACT inhaler Inhale 1 puff into the lungs daily.    [provider]  furosemide (LASIX) 20 MG tablet Take 20 mg by mouth daily as needed.    [provider]  HYDROcodone-acetaminophen (NORCO) 5-325 MG tablet Take 2 tablets by mouth every 6 (six) hours as needed for moderate pain. 08/14/17   Carloyn Manner, MD  Multiple Vitamin (MULTIVITAMIN) tablet Take 1 tablet by mouth daily.    [provider]  ondansetron (ZOFRAN) 4 MG tablet Take 1 tablet (4 mg total) by mouth every 8 (eight) hours as needed for up to  10 doses for nausea or vomiting. 08/14/17   Carloyn Manner, MD  oxyCODONE-acetaminophen (PERCOCET) 5-325 MG tablet Take 1 tablet by mouth every 4 (four) hours as needed for severe pain. 11/29/17   Alfred Levins, [place], MD  pantoprazole (PROTONIX) 40 MG tablet Take 40 mg by mouth daily.    [provider]  tiotropium (SPIRIVA) 18 MCG inhalation capsule Place 18 mcg into inhaler and inhale daily.    [provider]  tiZANidine (ZANAFLEX) 4 MG tablet Take 4 mg by mouth 3 (three) times  daily as needed for muscle spasms.    [provider]    Allergies Chocolate; Eggs or egg-derived products; and Milk-related compounds  No family history on file.  Social History Social History   Tobacco Use  . Smoking status: Current Every Day Smoker    Packs/day: 0.50    Years: 35.00    Pack years: 17.50    Types: Cigarettes  . Smokeless tobacco: Never Used  . Tobacco comment: since age 64  Substance Use Topics  . Alcohol use: No    Frequency: Never  . Drug use: Not on file    Review of Systems  Constitutional: Negative for fever. Eyes: Negative for visual changes. ENT: Negative for sore throat. Neck: No neck pain  Cardiovascular: + chest pain. Respiratory: + shortness of breath. Gastrointestinal: Negative for abdominal pain, vomiting or diarrhea. Genitourinary: Negative for dysuria. Musculoskeletal: Negative for back pain. Skin: Negative for rash. Neurological: Negative for headaches, weakness or numbness. Psych: No SI or HI  ____________________________________________   PHYSICAL EXAM:  VITAL SIGNS: ED Triage Vitals  Enc Vitals Group     BP 11/29/17 0709 136/77     Pulse Rate 11/29/17 0709 84     Resp 11/29/17 0709 20     Temp 11/29/17 0709 98.3 F (36.8 C)     Temp Source 11/29/17 0709 Oral     SpO2 11/29/17 0709 95 %     Weight 11/29/17 0711 192 lb (87.1 kg)     Height 11/29/17 0711 5' 11.5" (1.816 m)     Head Circumference --      Peak Flow --      Pain Score 11/29/17 0710 8     Pain Loc --      Pain Edu? --      Excl. in [place]? --     Constitutional: Alert and oriented. Well appearing and in no apparent distress. HEENT:      Head: Normocephalic and atraumatic.         Eyes: Conjunctivae are normal. Sclera is non-icteric.       Mouth/Throat: Mucous membranes are moist.       Neck: Supple with no signs of meningismus. Cardiovascular: Regular rate and rhythm. No murmurs, gallops, or rubs. 2+ symmetrical distal pulses are present in all  extremities. No JVD. Respiratory: Normal respiratory effort, patient is splinting, take shallow breaths, decrease air movement bilaterally, no wheezing or crackles.  Gastrointestinal: Soft, non tender, and non distended with positive bowel sounds. No rebound or guarding. Musculoskeletal: Nontender with normal range of motion in all extremities. No edema, cyanosis, or erythema of extremities. Neurologic: Normal speech and language. Face is symmetric. Moving all extremities. No gross focal neurologic deficits are appreciated. Skin: Skin is warm, dry and intact. No rash noted. Psychiatric: Mood and affect are normal. Speech and behavior are normal.  ____________________________________________   LABS (all labs ordered are listed, but only abnormal results are displayed)  Labs Reviewed  BASIC  METABOLIC PANEL - Abnormal; Notable for the following components:      Result Value   Glucose, Bld 101 (*)    Calcium 8.7 (*)    All other components within normal limits  CBC WITH DIFFERENTIAL/PLATELET - Abnormal; Notable for the following components:   WBC 10.9 (*)    Hemoglobin 12.8 (*)    MCV 75.6 (*)    MCH 24.2 (*)    RDW 17.9 (*)    Neutro Abs 7.4 (*)    All other components within normal limits  TROPONIN I   ____________________________________________  EKG  ED ECG REPORT I, Rudene Re, the attending physician, personally viewed and interpreted this ECG.  Normal sinus rhythm, rate of 84, normal intervals, normal axis, no ST elevations or depressions.  Normal EKG. ____________________________________________  RADIOLOGY  I have personally reviewed the images performed during this visit and I agree with the Radiologist's read.   Interpretation by Radiologist:  Dg Chest 2 View  Result Date: 11/29/2017 CLINICAL DATA:  Left-sided chest pain beginning last night. Smoking history. EXAM: CHEST - 2 VIEW COMPARISON:  01/18/2014 FINDINGS: Heart size is normal. There is a 2-2.3 cm  density in the lateral inferior left upper lobe at the same level as the hila. Whereas this could be pneumonia or pulmonary infarction, there is considerable concern for the potential of a mass lesion. Consider close follow-up or CT evaluation. The remainder the chest is clear. Hilar and mediastinal shadows are within normal limits. No effusions. No significant bone finding. IMPRESSION: 2-2.3 cm density in the inferolateral left upper lobe. This area looked normal in 2015. This could be pneumonia, infarction or a mass lesion and either close follow-up or chest CT would be suggested. Electronically Signed   By: Nelson Chimes M.D.   On: 11/29/2017 08:06   Ct Chest W Contrast  Result Date: 11/29/2017 CLINICAL DATA:  Chest pain. EXAM: CT CHEST WITH CONTRAST TECHNIQUE: Multidetector CT imaging of the chest was performed during intravenous contrast administration. CONTRAST:  20mL ISOVUE-370 IOPAMIDOL (ISOVUE-370) INJECTION 76% COMPARISON:  CT scan of October 03, 2012. FINDINGS: Cardiovascular: No significant vascular findings. Normal heart size. No pericardial effusion. Mediastinum/Nodes: No enlarged mediastinal, hilar, or axillary lymph nodes. Thyroid gland, trachea, and esophagus demonstrate no significant findings. Lungs/Pleura: No pneumothorax or pleural effusion is noted. Emphysematous disease is noted in the upper lobes bilaterally. 8 mm irregular density is noted in right upper lobe which was not present on prior exam. 2.6 x 2.0 cm irregular density is noted laterally in the left upper lobe. Parenchymal stranding is noted more posteriorly in the left upper lobe extending to the major fissure, some of which was present on prior exam and may represent scarring. Minimal left posterior basilar subsegmental atelectasis is noted. Upper Abdomen: No acute abnormality. Musculoskeletal: No chest wall abnormality. No acute or significant osseous findings. IMPRESSION: 2.6 x 2.0 cm irregular density is noted laterally in the  left upper lobe with some surrounding stranding. This is concerning for malignancy, although potentially may represent pneumonia. Correlation with clinical and laboratory findings is recommended. Also noted is 8 mm irregular nodule in right upper lobe which is new since prior exam and is concerning for possible malignancy. Based on the above findings, PET scan is recommended for further evaluation. Emphysema (ICD10-J43.9). Electronically Signed   By: Marijo Conception, M.D.   On: 11/29/2017 10:32      ____________________________________________   PROCEDURES  Procedure(s) performed: None Procedures Critical Care performed:  None ____________________________________________  INITIAL IMPRESSION / ASSESSMENT AND PLAN / ED COURSE  57 y.o. male with a history of smoking, sleep apnea on CPAP, COPD, and hypertension who presents for evaluation of sharp pleuritic left upper back/left-sided chest pain since yesterday. Pain is constant and worse with inspiration, movement, and coughing.  Patient is in no respiratory distress however he is splinting, normal work of breathing, normal sats, he does have decreased breath sounds bilaterally with no wheezing or crackles but part of it I believe is due to patient not taking deep breaths.  I do see a nodular infiltrate on the left upper lobe which could be due to pneumonia.  Reading from radiologist pending.  We will give duo nebs, Solu-Medrol, Tylenol.  No signs of sepsis. At this time low clinical suspicion for PE (no risk factors, no tachycardia, tachypnea, hypoxia) or dissection (normotensive, normal mediastinum, equal strong distal pulses x 4, neuro intact).     _________________________ 11:10 AM on 11/29/2017 -----------------------------------------  Chest x-ray was concerning for a 2.3 cm density in the left upper lobe.  Clinically does not seem to me like patient has pneumonia since he has no changes in his chronic cough, no fever or chills therefore  patient was sent for a CT chest with contrast which was concerning for a irregular density again seen on the left upper lobe with some surrounding stranding and also a irregular nodule in the right upper lobe which was new when compared to prior.  Per radiologist these findings are worrisome for malignancy however they could be seen with pneumonia as well.  I had a long discussion with patient and his wife and we decided to start him on antibiotics in case this is PNA and have him see an oncologist in 7 days for further evaluation for possible malignancy.  They both understand these recommendations and the concerning findings seen on CT scan.  Patient is going to be referred to Dr. Janese Banks.   As part of my medical decision making, I reviewed the following data within the [facility] notes reviewed and incorporated, Labs reviewed , EKG interpreted , Old chart reviewed, Radiograph reviewed , Notes from prior ED visits and [place] Controlled Substance Database    Pertinent labs & imaging results that were available during my care of the patient were reviewed by me and considered in my medical decision making (see chart for details).    ____________________________________________   FINAL CLINICAL IMPRESSION(S) / ED DIAGNOSES  Final diagnoses:  Lung mass  Community acquired pneumonia of left upper lobe of lung ([place])      NEW MEDICATIONS STARTED DURING THIS VISIT:  ED Discharge Orders        Ordered    doxycycline (VIBRAMYCIN) 100 MG capsule  2 times daily     11/29/17 1108    oxyCODONE-acetaminophen (PERCOCET) 5-325 MG tablet  Every 4 hours PRN     11/29/17 1108       Note:  This document was prepared using Dragon voice recognition software and may include unintentional dictation errors.    Alfred Levins, [place], MD 11/29/17 [phone]

## 2017-11-29 NOTE — ED Notes (Signed)
Pulse ox is staying at 89-90% while patient resting.  Placed on 2 liters oxygen and ox came right up.

## 2017-11-29 NOTE — ED Notes (Signed)
Pt reports awoke last pm with sharp pain from his upper left back radiating into his left chest. Pt reports pain is intermittent, eases off with rest but gets worse with movement or laying on his right side. Pt also reports painful to take deep breaths. Pt reports pain feels like it did when he had pneumonia before.

## 2017-12-02 DIAGNOSIS — R918 Other nonspecific abnormal finding of lung field: Secondary | ICD-10-CM | POA: Insufficient documentation

## 2017-12-02 NOTE — Progress Notes (Signed)
[facility]  Telephone:(336) [phone] Fax:(336) [phone]  ID: [name] OB: 1960-11-05  MR#: [id number]  DEY#:[id number]  Patient Care Team: Perrin Maltese, MD as PCP - General (Internal Medicine) Telford Nab, RN as Registered Nurse  CHIEF COMPLAINT: Left upper lobe lung mass  INTERVAL HISTORY: Patient is a 57 year old male who recently presented to the emergency room with pleuritic chest pain.  Subsequent CT scan revealed a suspicious left upper lobe lung mass.  He currently feels well and is asymptomatic.  He has no neurologic complaints.  He denies any recent fevers.  He has a good appetite and denies weight loss.  He has no chest pain, shortness of breath, cough, or hemoptysis.  He denies any nausea, vomiting, constipation, or diarrhea.  He has noted occasional blood in his stool.  He has no urinary complaints.  Patient offers no further specific complaints today.  REVIEW OF SYSTEMS:   Review of Systems  Constitutional: Negative.  Negative for fever, malaise/fatigue and weight loss.  Respiratory: Negative.  Negative for cough, hemoptysis and shortness of breath.   Cardiovascular: Negative.  Negative for chest pain and leg swelling.  Gastrointestinal: Positive for blood in stool. Negative for abdominal pain, nausea and vomiting.  Genitourinary: Negative.  Negative for dysuria and flank pain.  Musculoskeletal: Negative for back pain.  Skin: Negative.  Negative for rash.  Neurological: Negative.  Negative for sensory change, focal weakness and weakness.  Psychiatric/Behavioral: The patient is nervous/anxious.     As per HPI. Otherwise, a complete review of systems is negative.  PAST MEDICAL HISTORY: Past Medical History:  Diagnosis Date  . COPD (chronic obstructive pulmonary disease) ([place])   . GERD (gastroesophageal reflux disease)   . Gout   . Hypertension   . Multilevel degenerative disc disease   . Pneumonia 11/29/2017  . Sleep apnea    CPAP  .  Tachycardia   . Wears dentures    partial upper and lower    PAST SURGICAL HISTORY: Past Surgical History:  Procedure Laterality Date  . ABCESS DRAINAGE  2009   tonsil  . MASS EXCISION Right 08/14/2017   Procedure: EXCISION RIGHT TEMPORAL MASS SUBMENTAL MASS;  Surgeon: Carloyn Manner, MD;  Location: [facility];  Service: ENT;  Laterality: Right;  sleep apnea  . SEPTOPLASTY  2016   [place], [place]    FAMILY HISTORY: Family History  Problem Relation Age of Onset  . Anxiety disorder Mother   . Hypertension Sister   . Diabetes Sister   . Other Brother        mva  at age 1  . Asthma Sister   . Gout Brother   . Hypertension Brother     ADVANCED DIRECTIVES (Y/N):  N  HEALTH MAINTENANCE: Social History   Tobacco Use  . Smoking status: Current Every Day Smoker    Packs/day: 1.00    Years: 30.00    Pack years: 30.00    Types: Cigarettes  . Smokeless tobacco: Former Systems developer    Types: Snuff, Sarina Ser    Quit date: 12/05/1984  . Tobacco comment: since age 70  Substance Use Topics  . Alcohol use: No    Frequency: Never  . Drug use: Yes    Comment: recovering alcoholic x 2 y     Colonoscopy:  PAP:  Bone density:  Lipid panel:  Allergies  Allergen Reactions  . Chocolate Itching    (large amounts)  . Eggs Or Egg-Derived Products Itching and Nausea And Vomiting  .  Milk-Related Compounds Nausea And Vomiting    Current Outpatient Medications  Medication Sig Dispense Refill  . albuterol (PROVENTIL HFA;VENTOLIN HFA) 108 (90 Base) MCG/ACT inhaler Inhale into the lungs every 6 (six) hours as needed for wheezing or shortness of breath.    . allopurinol (ZYLOPRIM) 300 MG tablet Take 300 mg by mouth daily.    [name] amLODipine-benazepril (LOTREL) 10-40 MG capsule Take 1 capsule by mouth daily.    . Azelastine HCl 137 MCG/SPRAY SOLN Place into the nose daily.    . baclofen (LIORESAL) 10 MG tablet Take 10 mg by mouth 2 (two) times daily.     . ferrous sulfate 325 (65 FE) MG  tablet Take 325 mg by mouth daily.  3  . fluticasone (FLONASE) 50 MCG/ACT nasal spray Place into both nostrils daily as needed for allergies or rhinitis.    . fluticasone (FLOVENT HFA) 220 MCG/ACT inhaler Inhale 1 puff into the lungs daily.    . furosemide (LASIX) 20 MG tablet Take 20 mg by mouth daily as needed.    . Multiple Vitamin (MULTIVITAMIN) tablet Take 1 tablet by mouth daily.    [name] oxyCODONE-acetaminophen (PERCOCET) 5-325 MG tablet Take 1 tablet by mouth every 4 (four) hours as needed for severe pain. 20 tablet 0  . pantoprazole (PROTONIX) 40 MG tablet Take 40 mg by mouth daily.    [name] tiotropium (SPIRIVA) 18 MCG inhalation capsule Place 18 mcg into inhaler and inhale daily.    [name] doxycycline (VIBRAMYCIN) 100 MG capsule Take 1 capsule (100 mg total) by mouth 2 (two) times daily for 7 days. 14 capsule 0  . HYDROcodone-acetaminophen (NORCO) 5-325 MG tablet Take 2 tablets by mouth every 6 (six) hours as needed for moderate pain. (Patient not taking: Reported on 12/05/2017) 20 tablet 0  . naproxen (NAPROSYN) 500 MG tablet Take 500 mg by mouth daily as needed.  2  . ondansetron (ZOFRAN) 4 MG tablet Take 1 tablet (4 mg total) by mouth every 8 (eight) hours as needed for up to 10 doses for nausea or vomiting. (Patient not taking: Reported on 12/05/2017) 20 tablet 0  . sildenafil (VIAGRA) 50 MG tablet TAKE 1 TABLET 30MIN TO 4 HOURS PRIOR TO SEXUAL ACTIVITY.  1  . tiZANidine (ZANAFLEX) 4 MG tablet Take 4 mg by mouth 3 (three) times daily as needed for muscle spasms.     No current facility-administered medications for this visit.     OBJECTIVE: Vitals:   12/05/17 1129  BP: 116/69  Pulse: 81  Resp: 18  Temp: (!) 96.3 F (35.7 C)     Body mass index is 25.89 kg/m.    ECOG FS:0 - Asymptomatic  General: Well-developed, well-nourished, no acute distress. Eyes: Pink conjunctiva, anicteric sclera. HEENT: Normocephalic, moist mucous membranes, clear oropharnyx. Lungs: Clear to auscultation  bilaterally. Heart: Regular rate and rhythm. No rubs, murmurs, or gallops. Abdomen: Soft, nontender, nondistended. No organomegaly noted, normoactive bowel sounds. Musculoskeletal: No edema, cyanosis, or clubbing. Neuro: Alert, answering all questions appropriately. Cranial nerves grossly intact. Skin: No rashes or petechiae noted. Psych: Normal affect. Lymphatics: No cervical, calvicular, axillary or inguinal LAD.   LAB RESULTS:  Lab Results  Component Value Date   NA 135 11/29/2017   K 4.0 11/29/2017   CL 106 11/29/2017   CO2 24 11/29/2017   GLUCOSE 101 (H) 11/29/2017   BUN 12 11/29/2017   CREATININE 0.97 11/29/2017   CALCIUM 8.7 (L) 11/29/2017   GFRNONAA >60 11/29/2017   GFRAA >60 11/29/2017  Lab Results  Component Value Date   WBC 10.9 (H) 11/29/2017   NEUTROABS 7.4 (H) 11/29/2017   HGB 12.8 (L) 11/29/2017   HCT 40.0 11/29/2017   MCV 75.6 (L) 11/29/2017   PLT 381 11/29/2017     STUDIES: Dg Chest 2 View  Result Date: 11/29/2017 CLINICAL DATA:  Left-sided chest pain beginning last night. Smoking history. EXAM: CHEST - 2 VIEW COMPARISON:  01/18/2014 FINDINGS: Heart size is normal. There is a 2-2.3 cm density in the lateral inferior left upper lobe at the same level as the hila. Whereas this could be pneumonia or pulmonary infarction, there is considerable concern for the potential of a mass lesion. Consider close follow-up or CT evaluation. The remainder the chest is clear. Hilar and mediastinal shadows are within normal limits. No effusions. No significant bone finding. IMPRESSION: 2-2.3 cm density in the inferolateral left upper lobe. This area looked normal in 2015. This could be pneumonia, infarction or a mass lesion and either close follow-up or chest CT would be suggested. Electronically Signed   By: Nelson Chimes M.D.   On: 11/29/2017 08:06   Ct Chest W Contrast  Result Date: 11/29/2017 CLINICAL DATA:  Chest pain. EXAM: CT CHEST WITH CONTRAST TECHNIQUE:  Multidetector CT imaging of the chest was performed during intravenous contrast administration. CONTRAST:  74mL ISOVUE-370 IOPAMIDOL (ISOVUE-370) INJECTION 76% COMPARISON:  CT scan of October 03, 2012. FINDINGS: Cardiovascular: No significant vascular findings. Normal heart size. No pericardial effusion. Mediastinum/Nodes: No enlarged mediastinal, hilar, or axillary lymph nodes. Thyroid gland, trachea, and esophagus demonstrate no significant findings. Lungs/Pleura: No pneumothorax or pleural effusion is noted. Emphysematous disease is noted in the upper lobes bilaterally. 8 mm irregular density is noted in right upper lobe which was not present on prior exam. 2.6 x 2.0 cm irregular density is noted laterally in the left upper lobe. Parenchymal stranding is noted more posteriorly in the left upper lobe extending to the major fissure, some of which was present on prior exam and may represent scarring. Minimal left posterior basilar subsegmental atelectasis is noted. Upper Abdomen: No acute abnormality. Musculoskeletal: No chest wall abnormality. No acute or significant osseous findings. IMPRESSION: 2.6 x 2.0 cm irregular density is noted laterally in the left upper lobe with some surrounding stranding. This is concerning for malignancy, although potentially may represent pneumonia. Correlation with clinical and laboratory findings is recommended. Also noted is 8 mm irregular nodule in right upper lobe which is new since prior exam and is concerning for possible malignancy. Based on the above findings, PET scan is recommended for further evaluation. Emphysema (ICD10-J43.9). Electronically Signed   By: Marijo Conception, M.D.   On: 11/29/2017 10:32    ASSESSMENT: Left upper lobe lung mass  PLAN:   1. Left upper lobe lung mass: CT results from Nov 29, 2017 reviewed independently and reported as above.  Although mass may represent an underlying malignancy, it is suspicious for pneumonia as well.  Patient was given a  prescription of doxycycline for 7 days while in the hospital and will give him an additional 7 days treatment to see if there is resolution of the mass.  Repeat CT scan in 4 weeks.  If it is persistent, will pursue biopsy at that time.  Approximately 60 minutes was spent in discussion of which greater than 50% was consultation.  Patient expressed understanding and was in agreement with this plan. He also understands that He can call clinic at any time with any questions, concerns, or  complaints.   Cancer Staging No matching staging information was found for the patient.  Lloyd Huger, MD   12/07/2017 8:30 AM

## 2017-12-05 ENCOUNTER — Other Ambulatory Visit: Payer: Self-pay

## 2017-12-05 ENCOUNTER — Encounter: Payer: Self-pay | Admitting: Oncology

## 2017-12-05 ENCOUNTER — Inpatient Hospital Stay: Payer: Medicaid Other | Attending: Oncology | Admitting: Oncology

## 2017-12-05 ENCOUNTER — Encounter: Payer: Self-pay | Admitting: *Deleted

## 2017-12-05 DIAGNOSIS — Z79899 Other long term (current) drug therapy: Secondary | ICD-10-CM | POA: Diagnosis not present

## 2017-12-05 DIAGNOSIS — J449 Chronic obstructive pulmonary disease, unspecified: Secondary | ICD-10-CM | POA: Diagnosis not present

## 2017-12-05 DIAGNOSIS — I10 Essential (primary) hypertension: Secondary | ICD-10-CM | POA: Insufficient documentation

## 2017-12-05 DIAGNOSIS — F1011 Alcohol abuse, in remission: Secondary | ICD-10-CM | POA: Diagnosis not present

## 2017-12-05 DIAGNOSIS — Z792 Long term (current) use of antibiotics: Secondary | ICD-10-CM

## 2017-12-05 DIAGNOSIS — K921 Melena: Secondary | ICD-10-CM | POA: Insufficient documentation

## 2017-12-05 DIAGNOSIS — F17210 Nicotine dependence, cigarettes, uncomplicated: Secondary | ICD-10-CM | POA: Diagnosis not present

## 2017-12-05 DIAGNOSIS — R918 Other nonspecific abnormal finding of lung field: Secondary | ICD-10-CM | POA: Diagnosis not present

## 2017-12-05 DIAGNOSIS — K219 Gastro-esophageal reflux disease without esophagitis: Secondary | ICD-10-CM | POA: Diagnosis not present

## 2017-12-05 MED ORDER — DOXYCYCLINE HYCLATE 100 MG PO CAPS: 100.0000 mg | ORAL_CAPSULE | Freq: Two times a day (BID) | ORAL | 0 refills | Status: DC

## 2017-12-05 NOTE — Progress Notes (Signed)
Pt here for new pt evaluation. Pt dx w pneumonia 11/29/17-after going to ER . Stated that daily he has "mushy black stools " x 3 mo  Stated that this am "coughed up black phlegm " x 1

## 2017-12-06 ENCOUNTER — Telehealth: Payer: Self-pay | Admitting: *Deleted

## 2017-12-06 MED ORDER — DOXYCYCLINE HYCLATE 100 MG PO CAPS: 100.0000 mg | ORAL_CAPSULE | Freq: Two times a day (BID) | ORAL | 0 refills | Status: AC

## 2017-12-06 NOTE — Progress Notes (Signed)
  Oncology Nurse Navigator Documentation  Navigator Location: [facility]-Med Onc (12/06/17 0800)   )Navigator Encounter Type: Initial MedOnc (12/06/17 0800)   Abnormal Finding Date: 11/29/17 (12/06/17 0800)                   Treatment Phase: Abnormal Scans (12/06/17 0800) Barriers/Navigation Needs: No barriers at this time (12/06/17 0800)   Interventions: Coordination of Care (12/06/17 0800)   Coordination of Care: Appts;Radiology (12/06/17 0800)        Acuity: Level 1 (12/06/17 0800) Acuity Level 1: Initial guidance, education and coordination as needed;Minimal follow up required (12/06/17 0800)  met with patient during initial med-onc consultation with Dr. Grayland Ormond. All questions answered at the time of visit. Reviewed upcoming appts with patient. Contact info given and encouraged to call with any further questions or needs. Nothing further needed at this time. Pt verbalized understanding.     Time Spent with Patient: 30 (12/06/17 0800)

## 2017-12-06 NOTE — Telephone Encounter (Signed)
Pt's wife called in to report that prescription for doxycycline was not at pharmacy. Rx for doxycycline has been escribed again to [facility] in [place]. Pt's wife notified that prescription should be at pharmacy today. Nothing further needed.

## 2018-01-06 ENCOUNTER — Ambulatory Visit
Admission: RE | Admit: 2018-01-06 | Discharge: 2018-01-06 | Disposition: A | Discharge status: Home / Self Care | Payer: Medicaid Other | Source: Ambulatory Visit | Attending: Oncology | Admitting: Oncology

## 2018-01-06 DIAGNOSIS — R918 Other nonspecific abnormal finding of lung field: Secondary | ICD-10-CM | POA: Insufficient documentation

## 2018-01-06 DIAGNOSIS — J439 Emphysema, unspecified: Secondary | ICD-10-CM | POA: Diagnosis not present

## 2018-01-06 DIAGNOSIS — I700 Atherosclerosis of aorta: Secondary | ICD-10-CM | POA: Insufficient documentation

## 2018-01-06 DIAGNOSIS — Z87891 Personal history of nicotine dependence: Secondary | ICD-10-CM | POA: Diagnosis not present

## 2018-01-06 LAB — POCT I-STAT CREATININE: Creatinine, Ser: 1 mg/dL (ref 0.61–1.24)

## 2018-01-06 MED ORDER — IOPAMIDOL (ISOVUE-300) INJECTION 61%
75.0000 mL | Freq: Once | INTRAVENOUS | Status: AC | PRN
  Administered 2018-01-06: 75 mL via INTRAVENOUS

## 2018-01-06 NOTE — Progress Notes (Signed)
[facility]  Telephone:(336) [phone] Fax:(336) [phone]  ID: [name] OB: 1960-08-18  MR#: [id number]  BZJ#:[id number]  Patient Care Team: Perrin Maltese, MD as PCP - General (Internal Medicine) Telford Nab, RN as Registered Nurse  CHIEF COMPLAINT: Left upper lobe lung mass  INTERVAL HISTORY: Patient returns to clinic today for further evaluation and discussion of his imaging results.  He continues to feel well and remains asymptomatic.  He has no neurologic complaints.  He denies any recent fevers.  He has a good appetite and denies weight loss.  He has no chest pain, shortness of breath, cough, or hemoptysis.  He denies any nausea, vomiting, constipation, or diarrhea.  He denies any melena or hematochezia.  He has no urinary complaints.  Patient feels at his baseline offers no specific complaints today.  REVIEW OF SYSTEMS:   Review of Systems  Constitutional: Negative.  Negative for fever, malaise/fatigue and weight loss.  Respiratory: Negative.  Negative for cough, hemoptysis and shortness of breath.   Cardiovascular: Negative.  Negative for chest pain and leg swelling.  Gastrointestinal: Negative.  Negative for abdominal pain, blood in stool, nausea and vomiting.  Genitourinary: Negative.  Negative for dysuria and flank pain.  Musculoskeletal: Negative.  Negative for back pain.  Skin: Negative.  Negative for rash.  Neurological: Negative.  Negative for sensory change, focal weakness and weakness.  Psychiatric/Behavioral: The patient is nervous/anxious.     As per HPI. Otherwise, a complete review of systems is negative.  PAST MEDICAL HISTORY: Past Medical History:  Diagnosis Date  . COPD (chronic obstructive pulmonary disease) ([place])   . GERD (gastroesophageal reflux disease)   . Gout   . Hypertension   . Multilevel degenerative disc disease   . Pneumonia 11/29/2017  . Sleep apnea    CPAP  . Tachycardia   . Wears dentures    partial upper and  lower    PAST SURGICAL HISTORY: Past Surgical History:  Procedure Laterality Date  . ABCESS DRAINAGE  2009   tonsil  . MASS EXCISION Right 08/14/2017   Procedure: EXCISION RIGHT TEMPORAL MASS SUBMENTAL MASS;  Surgeon: Carloyn Manner, MD;  Location: [facility];  Service: ENT;  Laterality: Right;  sleep apnea  . SEPTOPLASTY  2016   [place], [place]    FAMILY HISTORY: Family History  Problem Relation Age of Onset  . Anxiety disorder Mother   . Hypertension Sister   . Diabetes Sister   . Other Brother        mva  at age 41  . Asthma Sister   . Gout Brother   . Hypertension Brother     ADVANCED DIRECTIVES (Y/N):  N  HEALTH MAINTENANCE: Social History   Tobacco Use  . Smoking status: Current Every Day Smoker    Packs/day: 1.00    Years: 30.00    Pack years: 30.00    Types: Cigarettes  . Smokeless tobacco: Former Systems developer    Types: Snuff, Sarina Ser    Quit date: 12/05/1984  . Tobacco comment: since age 57  Substance Use Topics  . Alcohol use: No    Frequency: Never  . Drug use: Yes    Comment: recovering alcoholic x 2 y     Colonoscopy:  PAP:  Bone density:  Lipid panel:  Allergies  Allergen Reactions  . Chocolate Itching    (large amounts)  . Eggs Or Egg-Derived Products Itching and Nausea And Vomiting  . Milk-Related Compounds Nausea And Vomiting    Current Outpatient  Medications  Medication Sig Dispense Refill  . albuterol (PROVENTIL HFA;VENTOLIN HFA) 108 (90 Base) MCG/ACT inhaler Inhale into the lungs every 6 (six) hours as needed for wheezing or shortness of breath.    . allopurinol (ZYLOPRIM) 300 MG tablet Take 300 mg by mouth daily.    [name] amLODipine-benazepril (LOTREL) 10-40 MG capsule Take 1 capsule by mouth daily.    . Azelastine HCl 137 MCG/SPRAY SOLN Place into the nose daily.    . baclofen (LIORESAL) 10 MG tablet Take 10 mg by mouth 2 (two) times daily.     . ferrous sulfate 325 (65 FE) MG tablet Take 325 mg by mouth daily.  3  . fluticasone  (FLONASE) 50 MCG/ACT nasal spray Place into both nostrils daily as needed for allergies or rhinitis.    . fluticasone (FLOVENT HFA) 220 MCG/ACT inhaler Inhale 1 puff into the lungs daily.    . furosemide (LASIX) 20 MG tablet Take 20 mg by mouth daily as needed.    . Multiple Vitamin (MULTIVITAMIN) tablet Take 1 tablet by mouth daily.    . naproxen (NAPROSYN) 500 MG tablet Take 500 mg by mouth daily as needed.  2  . ondansetron (ZOFRAN) 4 MG tablet Take 1 tablet (4 mg total) by mouth every 8 (eight) hours as needed for up to 10 doses for nausea or vomiting. 20 tablet 0  . oxyCODONE-acetaminophen (PERCOCET) 5-325 MG tablet Take 1 tablet by mouth every 4 (four) hours as needed for severe pain. 20 tablet 0  . pantoprazole (PROTONIX) 40 MG tablet Take 40 mg by mouth daily.    . sildenafil (VIAGRA) 50 MG tablet TAKE 1 TABLET 30MIN TO 4 HOURS PRIOR TO SEXUAL ACTIVITY.  1  . tiotropium (SPIRIVA) 18 MCG inhalation capsule Place 18 mcg into inhaler and inhale daily.    [name] tiZANidine (ZANAFLEX) 4 MG tablet Take 4 mg by mouth 3 (three) times daily as needed for muscle spasms.    [name] HYDROcodone-acetaminophen (NORCO) 5-325 MG tablet Take 2 tablets by mouth every 6 (six) hours as needed for moderate pain. (Patient not taking: Reported on 12/05/2017) 20 tablet 0   No current facility-administered medications for this visit.     OBJECTIVE: Vitals:   01/09/18 1122  BP: 109/74  Pulse: 96  Resp: 20  Temp: (!) 97 F (36.1 C)     Body mass index is 25.71 kg/m.    ECOG FS:0 - Asymptomatic  General: Well-developed, well-nourished, no acute distress. Eyes: Pink conjunctiva, anicteric sclera. Lungs: Clear to auscultation bilaterally. Heart: Regular rate and rhythm. No rubs, murmurs, or gallops. Abdomen: Soft, nontender, nondistended. No organomegaly noted, normoactive bowel sounds. Musculoskeletal: No edema, cyanosis, or clubbing. Neuro: Alert, answering all questions appropriately. Cranial nerves grossly  intact. Skin: No rashes or petechiae noted. Psych: Normal affect.  LAB RESULTS:  Lab Results  Component Value Date   NA 135 11/29/2017   K 4.0 11/29/2017   CL 106 11/29/2017   CO2 24 11/29/2017   GLUCOSE 101 (H) 11/29/2017   BUN 12 11/29/2017   CREATININE 1.00 01/06/2018   CALCIUM 8.7 (L) 11/29/2017   GFRNONAA >60 11/29/2017   GFRAA >60 11/29/2017    Lab Results  Component Value Date   WBC 9.7 01/09/2018   NEUTROABS 5.6 01/09/2018   HGB 12.6 (L) 01/09/2018   HCT 38.5 (L) 01/09/2018   MCV 74.9 (L) 01/09/2018   PLT 354 01/09/2018     STUDIES: Ct Chest W Contrast  Result Date: 01/06/2018 CLINICAL DATA:  Followup pulmonary lesions seen on recent chest CT. EXAM: CT CHEST WITH CONTRAST TECHNIQUE: Multidetector CT imaging of the chest was performed during intravenous contrast administration. CONTRAST:  16mL ISOVUE-300 IOPAMIDOL (ISOVUE-300) INJECTION 61% COMPARISON:  11/29/2017 FINDINGS: Cardiovascular: The heart is normal in size. No pericardial effusion. Stable mild tortuosity of the thoracic aorta. No significant atherosclerotic calcifications and no dissection. The branch vessels are patent. Scattered calcifications. Mediastinum/Nodes: Stable small scattered mediastinal and hilar lymph nodes. No significant interval change. The esophagus is grossly normal. Lungs/Pleura: The 8 mm right upper lobe pulmonary nodule has resolved. The left upper lobe bilobed nodular density has improved. Band of left lower lobe atelectasis has resolved. No acute infiltrates or pulmonary edema. Stable advanced emphysematous changes and pulmonary scarring. Upper Abdomen: No significant upper abdominal findings. Musculoskeletal: No significant findings. IMPRESSION: 1. Subsequent improved CT appearance of the chest. The 8 mm right upper lobe pulmonary nodule has resolved and the left upper lobe bilobed nodular density has improved. It still has some worrisome CT features but it is improvement suggests an  inflammatory or infectious process. Given the patient's underlying emphysema and smoking history I would recommend a followup noncontrast chest CT in 6-8 weeks to reassess this. 2. Stable scattered mediastinal and hilar lymph nodes. No new or progressive findings. Aortic Atherosclerosis (ICD10-I70.0) and Emphysema (ICD10-J43.9). Electronically Signed   By: Marijo Sanes M.D.   On: 01/06/2018 13:27    ASSESSMENT: Left upper lobe lung mass  PLAN:   1. Left upper lobe lung mass: CT scan results from January 06, 2018 reviewed independently and report as above with significant improvement of left upper lobe lesion.  No intervention is needed at this time.  CT scan remains slightly abnormal and recommendation is to continue observation, therefore will repeat CT in 3 months to ensure resolution.  Patient will follow-up 1 to 2 days later. 2.  Blood in stool: Patient does not complain of this today.  Unclear if he has had a colonoscopy.    I spent a total of 20 minutes face-to-face with the patient of which greater than 50% of the visit was spent in counseling and coordination of care as summarized above.  Patient expressed understanding and was in agreement with this plan. He also understands that He can call clinic at any time with any questions, concerns, or complaints.   Cancer Staging No matching staging information was found for the patient.  Lloyd Huger, MD   01/13/2018 7:43 AM

## 2018-01-09 ENCOUNTER — Encounter: Payer: Self-pay | Admitting: Oncology

## 2018-01-09 ENCOUNTER — Inpatient Hospital Stay: Payer: Medicaid Other | Attending: Oncology

## 2018-01-09 ENCOUNTER — Inpatient Hospital Stay (HOSPITAL_BASED_OUTPATIENT_CLINIC_OR_DEPARTMENT_OTHER): Payer: Medicaid Other | Admitting: Oncology

## 2018-01-09 VITALS — BP 109/74 | HR 96 | Temp 97.0°F | Resp 20 | Wt 185.7 lb

## 2018-01-09 DIAGNOSIS — J449 Chronic obstructive pulmonary disease, unspecified: Secondary | ICD-10-CM | POA: Insufficient documentation

## 2018-01-09 DIAGNOSIS — F17210 Nicotine dependence, cigarettes, uncomplicated: Secondary | ICD-10-CM | POA: Insufficient documentation

## 2018-01-09 DIAGNOSIS — F1011 Alcohol abuse, in remission: Secondary | ICD-10-CM | POA: Diagnosis not present

## 2018-01-09 DIAGNOSIS — I10 Essential (primary) hypertension: Secondary | ICD-10-CM | POA: Insufficient documentation

## 2018-01-09 DIAGNOSIS — K219 Gastro-esophageal reflux disease without esophagitis: Secondary | ICD-10-CM | POA: Insufficient documentation

## 2018-01-09 DIAGNOSIS — G4730 Sleep apnea, unspecified: Secondary | ICD-10-CM | POA: Insufficient documentation

## 2018-01-09 DIAGNOSIS — Z9989 Dependence on other enabling machines and devices: Secondary | ICD-10-CM | POA: Insufficient documentation

## 2018-01-09 DIAGNOSIS — R918 Other nonspecific abnormal finding of lung field: Secondary | ICD-10-CM

## 2018-01-09 DIAGNOSIS — Z79899 Other long term (current) drug therapy: Secondary | ICD-10-CM | POA: Insufficient documentation

## 2018-01-09 LAB — CBC WITH DIFFERENTIAL/PLATELET
Basophils Absolute: 0.1 10*3/uL (ref 0–0.1)
Basophils Relative: 1 %
EOS ABS: 0.3 10*3/uL (ref 0–0.7)
Eosinophils Relative: 3 %
HCT: 38.5 % — ABNORMAL LOW (ref 40.0–52.0)
HEMOGLOBIN: 12.6 g/dL — AB (ref 13.0–18.0)
LYMPHS ABS: 3 10*3/uL (ref 1.0–3.6)
LYMPHS PCT: 32 %
MCH: 24.5 pg — AB (ref 26.0–34.0)
MCHC: 32.7 g/dL (ref 32.0–36.0)
MCV: 74.9 fL — AB (ref 80.0–100.0)
MONOS PCT: 7 %
Monocytes Absolute: 0.6 10*3/uL (ref 0.2–1.0)
NEUTROS PCT: 57 %
Neutro Abs: 5.6 10*3/uL (ref 1.4–6.5)
Platelets: 354 10*3/uL (ref 150–440)
RBC: 5.14 MIL/uL (ref 4.40–5.90)
RDW: 17.4 % — ABNORMAL HIGH (ref 11.5–14.5)
WBC: 9.7 10*3/uL (ref 3.8–10.6)

## 2018-01-09 NOTE — Progress Notes (Signed)
Patient here today for CT results. 

## 2018-02-10 ENCOUNTER — Encounter: Payer: Self-pay | Admitting: *Deleted

## 2018-02-11 ENCOUNTER — Encounter
Admission: RE | Disposition: A | Discharge status: Home / Self Care | Payer: Self-pay | Source: Ambulatory Visit | Attending: Internal Medicine

## 2018-02-11 ENCOUNTER — Ambulatory Visit: Payer: Medicaid Other | Admitting: Anesthesiology

## 2018-02-11 ENCOUNTER — Encounter: Payer: Self-pay | Admitting: *Deleted

## 2018-02-11 ENCOUNTER — Ambulatory Visit
Admission: RE | Admit: 2018-02-11 | Discharge: 2018-02-11 | Disposition: A | Discharge status: Home / Self Care | Payer: Medicaid Other | Source: Ambulatory Visit | Attending: Internal Medicine | Admitting: Internal Medicine

## 2018-02-11 DIAGNOSIS — F17200 Nicotine dependence, unspecified, uncomplicated: Secondary | ICD-10-CM | POA: Insufficient documentation

## 2018-02-11 DIAGNOSIS — K449 Diaphragmatic hernia without obstruction or gangrene: Secondary | ICD-10-CM | POA: Diagnosis not present

## 2018-02-11 DIAGNOSIS — K2950 Unspecified chronic gastritis without bleeding: Secondary | ICD-10-CM | POA: Insufficient documentation

## 2018-02-11 DIAGNOSIS — K317 Polyp of stomach and duodenum: Secondary | ICD-10-CM | POA: Insufficient documentation

## 2018-02-11 DIAGNOSIS — G4730 Sleep apnea, unspecified: Secondary | ICD-10-CM | POA: Diagnosis not present

## 2018-02-11 DIAGNOSIS — M109 Gout, unspecified: Secondary | ICD-10-CM | POA: Insufficient documentation

## 2018-02-11 DIAGNOSIS — K921 Melena: Secondary | ICD-10-CM | POA: Diagnosis not present

## 2018-02-11 DIAGNOSIS — Z79899 Other long term (current) drug therapy: Secondary | ICD-10-CM | POA: Diagnosis not present

## 2018-02-11 DIAGNOSIS — K641 Second degree hemorrhoids: Secondary | ICD-10-CM | POA: Diagnosis not present

## 2018-02-11 DIAGNOSIS — D509 Iron deficiency anemia, unspecified: Secondary | ICD-10-CM | POA: Diagnosis present

## 2018-02-11 DIAGNOSIS — K2980 Duodenitis without bleeding: Secondary | ICD-10-CM | POA: Diagnosis not present

## 2018-02-11 DIAGNOSIS — I10 Essential (primary) hypertension: Secondary | ICD-10-CM | POA: Insufficient documentation

## 2018-02-11 DIAGNOSIS — K210 Gastro-esophageal reflux disease with esophagitis: Secondary | ICD-10-CM | POA: Diagnosis not present

## 2018-02-11 HISTORY — PX: ESOPHAGOGASTRODUODENOSCOPY (EGD) WITH PROPOFOL: SHX5813

## 2018-02-11 HISTORY — PX: COLONOSCOPY WITH PROPOFOL: SHX5780

## 2018-02-11 SURGERY — ESOPHAGOGASTRODUODENOSCOPY (EGD) WITH PROPOFOL
Anesthesia: General

## 2018-02-11 MED ORDER — SODIUM CHLORIDE 0.9 % IV SOLN
INTRAVENOUS | Status: DC
  Administered 2018-02-11: 1000 mL via INTRAVENOUS

## 2018-02-11 MED ORDER — LIDOCAINE HCL (PF) 2 % IJ SOLN
INTRAMUSCULAR | Status: DC | PRN
  Administered 2018-02-11: 100 mg via INTRADERMAL

## 2018-02-11 MED ORDER — LIDOCAINE HCL (PF) 1 % IJ SOLN
INTRAMUSCULAR | Status: AC
  Administered 2018-02-11: 0.3 mL
  Filled 2018-02-11: qty 2

## 2018-02-11 MED ORDER — PROPOFOL 500 MG/50ML IV EMUL
INTRAVENOUS | Status: DC | PRN
  Administered 2018-02-11: 175 ug/kg/min via INTRAVENOUS

## 2018-02-11 MED ORDER — PROPOFOL 10 MG/ML IV BOLUS
INTRAVENOUS | Status: DC | PRN
  Administered 2018-02-11: 100 mg via INTRAVENOUS

## 2018-02-11 MED ORDER — PHENYLEPHRINE HCL 10 MG/ML IJ SOLN
INTRAMUSCULAR | Status: DC | PRN
  Administered 2018-02-11 (×3): 100 ug via INTRAVENOUS

## 2018-02-11 NOTE — Op Note (Signed)
[facility] Gastroenterology Patient Name: Clarence Skinner Procedure Date: 02/11/2018 9:53 AM MRN: [id number] Account #: [id number] Date of Birth: [birth year] Admit Type: Outpatient Age: 57 Room: [facility] ENDO ROOM 2 Gender: Male Note Status: Finalized Procedure:            Upper GI endoscopy Indications:          Iron deficiency anemia, Melena Providers:            Benay Pike. Alice Reichert MD, MD Referring MD:         Perrin Maltese, MD (Referring MD) Medicines:            Propofol per Anesthesia Complications:        No immediate complications. Procedure:            Pre-Anesthesia Assessment:                       - The risks and benefits of the procedure and the                        sedation options and risks were discussed with the                        patient. All questions were answered and informed                        consent was obtained.                       - Patient identification and proposed procedure were                        verified prior to the procedure by the nurse. The                        procedure was verified in the procedure room.                       - ASA Grade Assessment: III - A patient with severe                        systemic disease.                       - After reviewing the risks and benefits, the patient                        was deemed in satisfactory condition to undergo the                        procedure.                       After obtaining informed consent, the endoscope was                        passed under direct vision. Throughout the procedure,                        the patient's blood pressure, pulse, and oxygen  saturations were monitored continuously. The Endoscope                        was introduced through the mouth, and advanced to the                        third part of duodenum. The upper GI endoscopy was                        accomplished without difficulty. The patient  tolerated                        the procedure well. Findings:      The Z-line was irregular and was found at the gastroesophageal junction.       Mucosa was biopsied with a cold forceps for histology. One specimen       bottle was sent to pathology.      A 1 cm hiatal hernia was present.      Diffuse moderately erythematous mucosa without bleeding was found in the       gastric antrum. Biopsies were taken with a cold forceps for Helicobacter       pylori testing.      A few 2 to 3 mm sessile polyps with no bleeding were found in the first       portion of the duodenum. Biopsies were taken with a cold forceps for       histology.      Scattered mild mucosal variance characterized by altered texture and       white specks was found in the third portion of the duodenum. Biopsies       were taken with a cold forceps for histology. Impression:           - Z-line irregular, at the gastroesophageal junction.                        Biopsied.                       - 1 cm hiatal hernia.                       - Erythematous mucosa in the antrum. Biopsied.                       - A few duodenal polyps. Biopsied.                       - Mucosal variant in the duodenum. Biopsied. Recommendation:       - Await pathology results.                       - Proceed with colonoscopy Procedure Code(s):    --- Professional ---                       [phone], Esophagogastroduodenoscopy, flexible, transoral;                        with biopsy, single or multiple Diagnosis Code(s):    --- Professional ---                       K92.1,  Melena (includes Hematochezia)                       D50.9, Iron deficiency anemia, unspecified                       K31.7, Polyp of stomach and duodenum                       K31.89, Other diseases of stomach and duodenum                       K44.9, Diaphragmatic hernia without obstruction or                        gangrene                       K22.8, Other specified diseases  of esophagus CPT copyright 2017 American Medical Association. All rights reserved. The codes documented in this report are preliminary and upon coder review may  be revised to meet current compliance requirements. Efrain Sella MD, MD 02/11/2018 10:15:30 AM This report has been signed electronically. Number of Addenda: 0 Note Initiated On: 02/11/2018 9:53 AM      [facility]

## 2018-02-11 NOTE — Anesthesia Post-op Follow-up Note (Signed)
Anesthesia QCDR form completed.        

## 2018-02-11 NOTE — Op Note (Signed)
[facility] Gastroenterology Patient Name: Clarence Skinner Procedure Date: 02/11/2018 9:51 AM MRN: [id number] Account #: [id number] Date of Birth: 09/18/1960 Admit Type: Outpatient Age: 57 Room: [facility] ENDO ROOM 2 Gender: Male Note Status: Finalized Procedure:            Colonoscopy Indications:          Melena, Iron deficiency anemia Providers:            Benay Pike. Alice Reichert MD, MD Referring MD:         Perrin Maltese, MD (Referring MD) Medicines:            Propofol per Anesthesia Complications:        No immediate complications. Procedure:            Pre-Anesthesia Assessment:                       - The risks and benefits of the procedure and the                        sedation options and risks were discussed with the                        patient. All questions were answered and informed                        consent was obtained.                       - Patient identification and proposed procedure were                        verified prior to the procedure by the nurse. The                        procedure was verified in the procedure room.                       - ASA Grade Assessment: III - A patient with severe                        systemic disease.                       - After reviewing the risks and benefits, the patient                        was deemed in satisfactory condition to undergo the                        procedure.                       After obtaining informed consent, the colonoscope was                        passed under direct vision. Throughout the procedure,                        the patient's blood pressure, pulse, and oxygen  saturations were monitored continuously. The                        Colonoscope was introduced through the anus and                        advanced to the the cecum, identified by appendiceal                        orifice and ileocecal valve. The colonoscopy was     performed without difficulty. The patient tolerated the                        procedure well. The quality of the bowel preparation                        was good. The ileocecal valve, appendiceal orifice, and                        rectum were photographed. Findings:      The perianal and digital rectal examinations were normal. Pertinent       negatives include normal sphincter tone and no palpable rectal lesions.      The colon (entire examined portion) appeared normal.      Non-bleeding internal hemorrhoids were found during retroflexion. The       hemorrhoids were Grade II (internal hemorrhoids that prolapse but reduce       spontaneously).      The exam was otherwise without abnormality. Impression:           - The entire examined colon is normal.                       - Non-bleeding internal hemorrhoids.                       - The examination was otherwise normal.                       - No specimens collected. Recommendation:       - Patient has a contact number available for                        emergencies. The signs and symptoms of potential                        delayed complications were discussed with the patient.                        Return to normal activities tomorrow. Written discharge                        instructions were provided to the patient.                       - Await pathology results from EGD, also performed                        today.                       - Patient has a contact number available  for                        emergencies. The signs and symptoms of potential                        delayed complications were discussed with the patient.                        Return to normal activities tomorrow. Written discharge                        instructions were provided to the patient.                       - Resume previous diet.                       - Continue present medications.                       - Repeat colonoscopy in 10 years  for screening purposes.                       - Return to GI office PRN.                       - The findings and recommendations were discussed with                        the patient and their family. Procedure Code(s):    --- Professional ---                       [phone], Colonoscopy, flexible; diagnostic, including                        collection of specimen(s) by brushing or washing, when                        performed (separate procedure) Diagnosis Code(s):    --- Professional ---                       D50.9, Iron deficiency anemia, unspecified                       K92.1, Melena (includes Hematochezia)                       K64.1, Second degree hemorrhoids CPT copyright 2017 American Medical Association. All rights reserved. The codes documented in this report are preliminary and upon coder review may  be revised to meet current compliance requirements. Efrain Sella MD, MD 02/11/2018 10:29:15 AM This report has been signed electronically. Number of Addenda: 0 Note Initiated On: 02/11/2018 9:51 AM Scope Withdrawal Time: 0 hours 4 minutes 15 seconds  Total Procedure Duration: 0 hours 8 minutes 37 seconds       [facility]

## 2018-02-11 NOTE — Transfer of Care (Signed)
Immediate Anesthesia Transfer of Care Note  Patient: Clarence Skinner  Procedure(s) Performed: ESOPHAGOGASTRODUODENOSCOPY (EGD) WITH PROPOFOL (N/A ) COLONOSCOPY WITH PROPOFOL (N/A )  Patient Location: PACU  Anesthesia Type:General  Level of Consciousness: sedated  Airway & Oxygen Therapy: Patient Spontanous Breathing and Patient connected to nasal cannula oxygen  Post-op Assessment: Report given to RN and Post -op Vital signs reviewed and stable  Post vital signs: Reviewed and stable  Last Vitals:  Vitals Value Taken Time  BP 93/68 02/11/2018 10:33 AM  Temp 36.2 C 02/11/2018 10:30 AM  Pulse 68 02/11/2018 10:34 AM  Resp 22 02/11/2018 10:34 AM  SpO2 93 % 02/11/2018 10:34 AM  Vitals shown include unvalidated device data.  Last Pain:  Vitals:   02/11/18 1030  TempSrc: Tympanic  PainSc: 0-No pain      Patients Stated Pain Goal: 0 (30/73/54 3014)  Complications: No apparent anesthesia complications

## 2018-02-11 NOTE — Interval H&P Note (Signed)
History and Physical Interval Note:  02/11/2018 9:51 AM  Clarence Skinner  has presented today for surgery, with the diagnosis of Hx of microcytic hypchronic anemia Melena  The various methods of treatment have been discussed with the patient and family. After consideration of risks, benefits and other options for treatment, the patient has consented to  Procedure(s): ESOPHAGOGASTRODUODENOSCOPY (EGD) WITH PROPOFOL (N/A) COLONOSCOPY WITH PROPOFOL (N/A) as a surgical intervention .  The patient's history has been reviewed, patient examined, no change in status, stable for surgery.  I have reviewed the patient's chart and labs.  Questions were answered to the patient's satisfaction.     [place], [place]

## 2018-02-11 NOTE — Anesthesia Preprocedure Evaluation (Signed)
Anesthesia Evaluation  Patient identified by MRN, date of birth, ID band Patient awake    Reviewed: Allergy & Precautions, NPO status , Patient's Chart, lab work & pertinent test results, reviewed documented beta blocker date and time   Airway Mallampati: III  TM Distance: >3 FB     Dental  (+) Chipped   Pulmonary sleep apnea , pneumonia, resolved, COPD, Current Smoker,           Cardiovascular hypertension, Pt. on medications      Neuro/Psych    GI/Hepatic GERD  ,  Endo/Other    Renal/GU      Musculoskeletal   Abdominal   Peds  Hematology   Anesthesia Other Findings Gout. Lung mass.  Reproductive/Obstetrics                             Anesthesia Physical Anesthesia Plan  ASA: III  Anesthesia Plan: General   Post-op Pain Management:    Induction: Intravenous  PONV Risk Score and Plan:   Airway Management Planned:   Additional Equipment:   Intra-op Plan:   Post-operative Plan:   Informed Consent: I have reviewed the patients History and Physical, chart, labs and discussed the procedure including the risks, benefits and alternatives for the proposed anesthesia with the patient or authorized representative who has indicated his/her understanding and acceptance.     Plan Discussed with: CRNA  Anesthesia Plan Comments:         Anesthesia Quick Evaluation

## 2018-02-11 NOTE — Anesthesia Postprocedure Evaluation (Signed)
Anesthesia Post Note  Patient: Clarence Skinner  Procedure(s) Performed: ESOPHAGOGASTRODUODENOSCOPY (EGD) WITH PROPOFOL (N/A ) COLONOSCOPY WITH PROPOFOL (N/A )  Patient location during evaluation: Endoscopy Anesthesia Type: General Level of consciousness: awake and alert Pain management: pain level controlled Vital Signs Assessment: post-procedure vital signs reviewed and stable Respiratory status: spontaneous breathing, nonlabored ventilation, respiratory function stable and patient connected to nasal cannula oxygen Cardiovascular status: blood pressure returned to baseline and stable Postop Assessment: no apparent nausea or vomiting Anesthetic complications: no     Last Vitals:  Vitals:   02/11/18 1050 02/11/18 1100  BP: 111/71 114/72  Pulse: 77 75  Resp: (!) 27 19  Temp:    SpO2: 99% 97%    Last Pain:  Vitals:   02/11/18 1050  TempSrc:   PainSc: 0-No pain                 Jarl Sellitto S

## 2018-02-11 NOTE — H&P (Signed)
Outpatient short stay form Pre-procedure 02/11/2018 9:48 AM Clarence Kanner K. Clarence Skinner, M.D.  Primary Physician: Lamonte Sakai, M.D.  Reason for visit:  Melena, anemia microcytic  History of present illness:  57 year old male with a history of COPD presents for hyperchromic microcytic anemia. He also has history of melena in his stool. He does take iron supplements, howevever.    Current Facility-Administered Medications:  .  0.9 %  sodium chloride infusion, , Intravenous, Continuous, [place], Benay Pike, MD, Last Rate: 20 mL/hr at 02/11/18 0946, 1,000 mL at 02/11/18 0946  Medications Prior to Admission  Medication Sig Dispense Refill Last Dose  . albuterol (PROVENTIL HFA;VENTOLIN HFA) 108 (90 Base) MCG/ACT inhaler Inhale into the lungs every 6 (six) hours as needed for wheezing or shortness of breath.   02/11/2018 at 600  . allopurinol (ZYLOPRIM) 300 MG tablet Take 300 mg by mouth daily.   02/11/2018 at Unknown time  . amLODipine-benazepril (LOTREL) 10-40 MG capsule Take 1 capsule by mouth daily.   02/11/2018 at Unknown time  . Azelastine HCl 137 MCG/SPRAY SOLN Place into the nose daily.   02/10/2018 at Unknown time  . baclofen (LIORESAL) 10 MG tablet Take 10 mg by mouth 2 (two) times daily.    02/11/2018 at Unknown time  . ferrous sulfate 325 (65 FE) MG tablet Take 325 mg by mouth daily.  3 Past Week at Unknown time  . fluticasone (FLOVENT HFA) 220 MCG/ACT inhaler Inhale 1 puff into the lungs daily.   02/10/2018 at Unknown time  . furosemide (LASIX) 20 MG tablet Take 20 mg by mouth daily as needed.   Past Week at Unknown time  . HYDROcodone-acetaminophen (NORCO) 5-325 MG tablet Take 2 tablets by mouth every 6 (six) hours as needed for moderate pain. 20 tablet 0 Past Week at Unknown time  . Multiple Vitamin (MULTIVITAMIN) tablet Take 1 tablet by mouth daily.   Past Week at Unknown time  . naproxen (NAPROSYN) 500 MG tablet Take 500 mg by mouth daily as needed.  2 02/10/2018 at Unknown time  . ondansetron  (ZOFRAN) 4 MG tablet Take 1 tablet (4 mg total) by mouth every 8 (eight) hours as needed for up to 10 doses for nausea or vomiting. 20 tablet 0 Past Week at Unknown time  . oxyCODONE-acetaminophen (PERCOCET) 5-325 MG tablet Take 1 tablet by mouth every 4 (four) hours as needed for severe pain. 20 tablet 0 Past Week at Unknown time  . pantoprazole (PROTONIX) 40 MG tablet Take 40 mg by mouth daily.   02/11/2018 at Unknown time  . tiotropium (SPIRIVA) 18 MCG inhalation capsule Place 18 mcg into inhaler and inhale daily.   02/11/2018 at Unknown time  . tiZANidine (ZANAFLEX) 4 MG tablet Take 4 mg by mouth 3 (three) times daily as needed for muscle spasms.   Past Week at Unknown time  . fluticasone (FLONASE) 50 MCG/ACT nasal spray Place into both nostrils daily as needed for allergies or rhinitis.   Taking  . sildenafil (VIAGRA) 50 MG tablet TAKE 1 TABLET 30MIN TO 4 HOURS PRIOR TO SEXUAL ACTIVITY.  1 Taking     Allergies  Allergen Reactions  . Chocolate Itching    (large amounts)  . Eggs Or Egg-Derived Products Itching and Nausea And Vomiting  . Milk-Related Compounds Nausea And Vomiting     Past Medical History:  Diagnosis Date  . COPD (chronic obstructive pulmonary disease) ([place])   . GERD (gastroesophageal reflux disease)   . Gout   . Hypertension   .  Multilevel degenerative disc disease   . Pneumonia 11/29/2017  . Sleep apnea    CPAP  . Tachycardia   . Wears dentures    partial upper and lower    Review of systems:  Otherwise negative.    Physical Exam  Gen: Alert, oriented. Appears stated age.  HEENT: [place]/AT. PERRLA. Lungs: CTA, no wheezes. CV: RR nl S1, S2. Abd: soft, benign, no masses. BS+ Ext: No edema. Pulses 2+    Planned procedures: Proceed with EGD and colonoscopy. The patient understands the nature of the planned procedure, indications, risks, alternatives and potential complications including but not limited to bleeding, infection, perforation, damage to internal  organs and possible oversedation/side effects from anesthesia. The patient agrees and gives consent to proceed.  Please refer to procedure notes for findings, recommendations and patient disposition/instructions.     Clarence Skinner, M.D. Gastroenterology 02/11/2018  9:48 AM

## 2018-02-11 NOTE — Anesthesia Procedure Notes (Signed)
Date/Time: 02/11/2018 10:00 AM Performed by: Nelda Marseille, CRNA Pre-anesthesia Checklist: Patient identified, Emergency Drugs available, Suction available, Patient being monitored and Timeout performed Oxygen Delivery Method: Nasal cannula

## 2018-02-12 LAB — SURGICAL PATHOLOGY

## 2018-02-13 ENCOUNTER — Encounter: Payer: Self-pay | Admitting: Internal Medicine

## 2018-04-06 NOTE — Progress Notes (Signed)
[facility]  [phone] [phone]  ID: [name] OB: [birth year]  MR#: [id number]  GNO#:[id number]  Patient Care Team: Perrin Maltese, MD as PCP - General (Internal Medicine)  CHIEF COMPLAINT: Left upper lobe lung mass  INTERVAL HISTORY: Patient returns to clinic today for further evaluation and discussion of his imaging results.  He continues to feel well and remains asymptomatic.  He admits to occasional loose stools.  He has no neurologic complaints.  He denies any recent fevers.  He has a good appetite and denies weight loss.  He has no chest pain, shortness of breath, cough, or hemoptysis.  He denies any nausea, vomiting, constipation, or diarrhea.  He denies any melena or hematochezia.  He has no urinary complaints. Patient feels at his baseline offers no specific complaints today.  REVIEW OF SYSTEMS:   Review of Systems  Constitutional: Negative.  Negative for fever, malaise/fatigue and weight loss.  Respiratory: Negative.  Negative for cough, hemoptysis and shortness of breath.   Cardiovascular: Negative.  Negative for chest pain and leg swelling.  Gastrointestinal: Negative.  Negative for abdominal pain, blood in stool, nausea and vomiting.  Genitourinary: Negative.  Negative for dysuria and flank pain.  Musculoskeletal: Negative.  Negative for back pain.  Skin: Negative.  Negative for rash.  Neurological: Negative.  Negative for sensory change, focal weakness and weakness.  Psychiatric/Behavioral: The patient is nervous/anxious.     As per HPI. Otherwise, a complete review of systems is negative.  PAST MEDICAL HISTORY: Past Medical History:  Diagnosis Date  . COPD (chronic obstructive pulmonary disease) ([place])   . GERD (gastroesophageal reflux disease)   . Gout   . Hypertension   . Multilevel degenerative disc disease   . Pneumonia 11/29/2017  . Sleep apnea    CPAP  . Tachycardia   . Wears dentures    partial upper and  lower    PAST SURGICAL HISTORY: Past Surgical History:  Procedure Laterality Date  . ABCESS DRAINAGE  2009   tonsil  . COLONOSCOPY WITH PROPOFOL N/A 02/11/2018   Procedure: COLONOSCOPY WITH PROPOFOL;  Surgeon: Toledo, Benay Pike, MD;  Location: [facility];  Service: Endoscopy;  Laterality: N/A;  . ESOPHAGOGASTRODUODENOSCOPY (EGD) WITH PROPOFOL N/A 02/11/2018   Procedure: ESOPHAGOGASTRODUODENOSCOPY (EGD) WITH PROPOFOL;  Surgeon: Toledo, Benay Pike, MD;  Location: [facility];  Service: Endoscopy;  Laterality: N/A;  . MASS EXCISION Right 08/14/2017   Procedure: EXCISION RIGHT TEMPORAL MASS SUBMENTAL MASS;  Surgeon: Carloyn Manner, MD;  Location: [facility];  Service: ENT;  Laterality: Right;  sleep apnea  . SEPTOPLASTY  2016   [place]    FAMILY HISTORY: Family History  Problem Relation Age of Onset  . Anxiety disorder Mother   . Hypertension Sister   . Diabetes Sister   . Other Brother        mva  at age 68  . Asthma Sister   . Gout Brother   . Hypertension Brother     ADVANCED DIRECTIVES (Y/N):  N  HEALTH MAINTENANCE: Social History   Tobacco Use  . Smoking status: Current Every Day Smoker    Packs/day: 1.00    Years: 30.00    Pack years: 30.00    Types: Cigarettes  . Smokeless tobacco: Former Systems developer    Types: Snuff, Sarina Ser    Quit date: 12/05/1984  . Tobacco comment: since age 28  Substance Use Topics  . Alcohol use: No    Frequency: Never    Comment:  recovering alcoholic x 2 years  . Drug use: Yes     Colonoscopy:  PAP:  Bone density:  Lipid panel:  Allergies  Allergen Reactions  . Chocolate Itching    (large amounts)  . Eggs Or Egg-Derived Products Itching and Nausea And Vomiting  . Milk-Related Compounds Nausea And Vomiting    Current Outpatient Medications  Medication Sig Dispense Refill  . albuterol (PROVENTIL HFA;VENTOLIN HFA) 108 (90 Base) MCG/ACT inhaler Inhale into the lungs every 6 (six) hours as needed for wheezing or  shortness of breath.    . allopurinol (ZYLOPRIM) 300 MG tablet Take 300 mg by mouth daily.    [name] amLODipine-benazepril (LOTREL) 10-40 MG capsule Take 1 capsule by mouth daily.    . Azelastine HCl 137 MCG/SPRAY SOLN Place into the nose daily.    . baclofen (LIORESAL) 10 MG tablet Take 10 mg by mouth 2 (two) times daily.     . ferrous sulfate 325 (65 FE) MG tablet Take 325 mg by mouth daily.  3  . fluticasone (FLONASE) 50 MCG/ACT nasal spray Place into both nostrils daily as needed for allergies or rhinitis.    . fluticasone (FLOVENT HFA) 220 MCG/ACT inhaler Inhale 1 puff into the lungs daily.    . furosemide (LASIX) 20 MG tablet Take 20 mg by mouth daily as needed.    . Multiple Vitamin (MULTIVITAMIN) tablet Take 1 tablet by mouth daily.    . naproxen (NAPROSYN) 500 MG tablet Take 500 mg by mouth daily as needed.  2  . ondansetron (ZOFRAN) 4 MG tablet Take 1 tablet (4 mg total) by mouth every 8 (eight) hours as needed for up to 10 doses for nausea or vomiting. 20 tablet 0  . pantoprazole (PROTONIX) 40 MG tablet Take 40 mg by mouth daily.    . sildenafil (VIAGRA) 50 MG tablet TAKE 1 TABLET 30MIN TO 4 HOURS PRIOR TO SEXUAL ACTIVITY.  1  . tiotropium (SPIRIVA) 18 MCG inhalation capsule Place 18 mcg into inhaler and inhale daily.    [name] tiZANidine (ZANAFLEX) 4 MG tablet Take 4 mg by mouth 3 (three) times daily as needed for muscle spasms.     No current facility-administered medications for this visit.     OBJECTIVE: There were no vitals filed for this visit.   There is no height or weight on file to calculate BMI.    ECOG FS:0 - Asymptomatic  General: Well-developed, well-nourished, no acute distress. Eyes: Pink conjunctiva, anicteric sclera. HEENT: Normocephalic, moist mucous membranes. Lungs: Clear to auscultation bilaterally. Heart: Regular rate and rhythm. No rubs, murmurs, or gallops. Abdomen: Soft, nontender, nondistended. No organomegaly noted, normoactive bowel sounds. Musculoskeletal:  No edema, cyanosis, or clubbing. Neuro: Alert, answering all questions appropriately. Cranial nerves grossly intact. Skin: No rashes or petechiae noted. Psych: Normal affect.  LAB RESULTS:  Lab Results  Component Value Date   NA 135 11/29/2017   K 4.0 11/29/2017   CL 106 11/29/2017   CO2 24 11/29/2017   GLUCOSE 101 (H) 11/29/2017   BUN 12 11/29/2017   CREATININE 1.00 01/06/2018   CALCIUM 8.7 (L) 11/29/2017   GFRNONAA >60 11/29/2017   GFRAA >60 11/29/2017    Lab Results  Component Value Date   WBC 9.7 01/09/2018   NEUTROABS 5.6 01/09/2018   HGB 12.6 (L) 01/09/2018   HCT 38.5 (L) 01/09/2018   MCV 74.9 (L) 01/09/2018   PLT 354 01/09/2018     STUDIES: Ct Chest Wo Contrast  Result Date: 04/07/2018 CLINICAL DATA:  Follow-up pulmonary nodules. EXAM: CT CHEST WITHOUT CONTRAST TECHNIQUE: Multidetector CT imaging of the chest was performed following the standard protocol without IV contrast. COMPARISON:  01/06/2018 FINDINGS: Cardiovascular: The heart size appears within normal limits. Mild aortic atherosclerosis. Calcification in the LAD coronary artery identified. Mediastinum/Nodes: Normal appearance of the thyroid gland. The trachea appears patent and is midline. Normal appearance of the esophagus. There is no axillary or mediastinal adenopathy. Lungs/Pleura: Moderate changes of emphysema identified. There is been near complete resolution of left upper lobe subpleural nodule with surrounding ground-glass attenuation and scarring. On today's study this measures 3 mm, image 82/3. Previously 11 mm. Focal area of ground-glass attenuation within the right middle lobe corresponds to the previously noted 8 mm solid nodule compatible with resolving inflammation/infection. No new pulmonary nodules identified. Upper Abdomen: No acute findings identified within the upper abdomen. Musculoskeletal: No suspicious bone lesions. IMPRESSION: 1. Continued resolution of right upper lobe and right middle lobe  pulmonary nodules compatible with inflammatory/infectious process. No suspicious pulmonary nodules identified at this time. 2. Aortic Atherosclerosis (ICD10-I70.0) and Emphysema (ICD10-J43.9). 3. Lad coronary artery atherosclerotic calcifications. Electronically Signed   By: Kerby Moors M.D.   On: 04/07/2018 12:26    ASSESSMENT: Left upper lobe lung mass  PLAN:   1. Left upper lobe lung mass: CT scan results from April 07, 2018 reviewed independently and reported as above with no mention of left upper lobe mass as seen on CT scans previously.  Right upper and middle lobe nodules have also improved.  No further intervention is needed at this time.  No further imaging is necessary.  No follow-up has been scheduled.   2.  Blood in stool: Patient had colonoscopy and EGD on February 11, 2018 that were reported as normal.  Continue follow-up with primary care as indicated.    I spent a total of 20 minutes face-to-face with the patient of which greater than 50% of the visit was spent in counseling and coordination of care as detailed above.  Patient expressed understanding and was in agreement with this plan. He also understands that He can call clinic at any time with any questions, concerns, or complaints.    Lloyd Huger, MD   04/10/2018 2:39 PM

## 2018-04-07 ENCOUNTER — Ambulatory Visit
Admission: RE | Admit: 2018-04-07 | Discharge: 2018-04-07 | Disposition: A | Discharge status: Home / Self Care | Payer: Medicaid Other | Source: Ambulatory Visit | Attending: Oncology | Admitting: Oncology

## 2018-04-07 DIAGNOSIS — J439 Emphysema, unspecified: Secondary | ICD-10-CM | POA: Diagnosis not present

## 2018-04-07 DIAGNOSIS — I700 Atherosclerosis of aorta: Secondary | ICD-10-CM | POA: Diagnosis not present

## 2018-04-07 DIAGNOSIS — R918 Other nonspecific abnormal finding of lung field: Secondary | ICD-10-CM | POA: Insufficient documentation

## 2018-04-07 DIAGNOSIS — I2510 Atherosclerotic heart disease of native coronary artery without angina pectoris: Secondary | ICD-10-CM | POA: Diagnosis not present

## 2018-04-10 ENCOUNTER — Encounter: Payer: Self-pay | Admitting: Oncology

## 2018-04-10 ENCOUNTER — Inpatient Hospital Stay: Payer: Medicaid Other | Attending: Oncology | Admitting: Oncology

## 2018-04-10 VITALS — BP 120/75 | HR 97 | Temp 97.5°F | Resp 18 | Wt 183.4 lb

## 2018-04-10 DIAGNOSIS — K921 Melena: Secondary | ICD-10-CM | POA: Insufficient documentation

## 2018-04-10 DIAGNOSIS — R918 Other nonspecific abnormal finding of lung field: Secondary | ICD-10-CM | POA: Diagnosis present

## 2018-04-10 NOTE — Progress Notes (Signed)
Pt in for follow up and results. Denies any concerns.

## 2018-08-11 ENCOUNTER — Ambulatory Visit: Payer: Medicaid Other | Admitting: Urology

## 2018-08-11 ENCOUNTER — Encounter: Payer: Self-pay | Admitting: Urology

## 2018-08-11 VITALS — BP 115/62 | HR 114 | Ht 68.0 in | Wt 175.0 lb

## 2018-08-11 DIAGNOSIS — R972 Elevated prostate specific antigen [PSA]: Secondary | ICD-10-CM

## 2018-08-11 MED ORDER — TADALAFIL 20 MG PO TABS: 20.0000 mg | ORAL_TABLET | Freq: Every day | ORAL | 11 refills | Status: DC | PRN

## 2018-08-11 MED ORDER — DIAZEPAM 5 MG PO TABS: 5.0000 mg | ORAL_TABLET | Freq: Once | ORAL | 0 refills | Status: DC | PRN

## 2018-08-11 NOTE — Patient Instructions (Signed)

## 2018-08-11 NOTE — Progress Notes (Signed)
08/11/2018 2:40 PM   Clarence Skinner May 30, 1961 [id number]  Referring provider: Perrin Maltese, MD [place], [place] [address]  CC: Elevated PSA  HPI: I saw Mr. Clarence Skinner in consultation for elevated PSA from Georgian Co, NP.  He is a 58 year old African-American male with a past medical history notable for COPD, sleep apnea, and hypertension who was recently found to have an elevated PSA of 5.5 in November 2019.  This was rechecked in December 2019 and remained elevated at 8.0.  He is unsure if he has a family history of prostate cancer.  He is noncompliant with his CPAP machine and reports nocturia 2-3 times per night.  He also reports significant erectile dysfunction that has not improved with low-dose sildenafil.  He does take amlodipine for hypertension.  He denies any gross hematuria, flank pain, weight loss, or bone pain.  He has never undergone prior prostate biopsy.  Denies any urinary symptoms during the day.  There are no aggravating or alleviating factors.  Severity is moderate.  He denies prior abdominal surgeries.   PMH: Past Medical History:  Diagnosis Date  . COPD (chronic obstructive pulmonary disease) ([place])   . GERD (gastroesophageal reflux disease)   . Gout   . Hypertension   . Multilevel degenerative disc disease   . Pneumonia 11/29/2017  . Sleep apnea    CPAP  . Tachycardia   . Wears dentures    partial upper and lower    Surgical History: Past Surgical History:  Procedure Laterality Date  . ABCESS DRAINAGE  2009   tonsil  . COLONOSCOPY WITH PROPOFOL N/A 02/11/2018   Procedure: COLONOSCOPY WITH PROPOFOL;  Surgeon: Toledo, Benay Pike, MD;  Location: [facility];  Service: Endoscopy;  Laterality: N/A;  . ESOPHAGOGASTRODUODENOSCOPY (EGD) WITH PROPOFOL N/A 02/11/2018   Procedure: ESOPHAGOGASTRODUODENOSCOPY (EGD) WITH PROPOFOL;  Surgeon: Toledo, Benay Pike, MD;  Location: [facility];  Service: Endoscopy;  Laterality: N/A;  . MASS EXCISION  Right 08/14/2017   Procedure: EXCISION RIGHT TEMPORAL MASS SUBMENTAL MASS;  Surgeon: Carloyn Manner, MD;  Location: [facility];  Service: ENT;  Laterality: Right;  sleep apnea  . SEPTOPLASTY  2016   [place]    Allergies:  Allergies  Allergen Reactions  . Chocolate Itching    (large amounts)  . Eggs Or Egg-Derived Products Itching and Nausea And Vomiting  . Milk-Related Compounds Nausea And Vomiting    Family History: Family History  Problem Relation Age of Onset  . Anxiety disorder Mother   . Hypertension Sister   . Diabetes Sister   . Other Brother        mva  at age 39  . Asthma Sister   . Gout Brother   . Hypertension Brother     Social History:  reports that he has been smoking cigarettes. He has a 30.00 pack-year smoking history. He quit smokeless tobacco use about 33 years ago.  His smokeless tobacco use included snuff and chew. He reports current drug use. He reports that he does not drink alcohol.  ROS: Please see flowsheet from today's date for complete review of systems.  Physical Exam: BP 115/62   Pulse (!) 114   Ht 5\' 8"  (1.727 m)   Wt 175 lb (79.4 kg)   BMI 26.61 kg/m    Constitutional:  Alert and oriented, No acute distress. Cardiovascular: No clubbing, cyanosis, or edema. Respiratory: Normal respiratory effort, no increased work of breathing. GI: Abdomen is soft, nontender, nondistended, no abdominal masses GU:  No CVA tenderness DRE: Deferred to time of biopsy Lymph: No cervical or inguinal lymphadenopathy. Skin: No rashes, bruises or suspicious lesions. Neurologic: Grossly intact, no focal deficits, moving all 4 extremities. Psychiatric: Normal mood and affect.  Laboratory Data: PSA history 06/2018: 8.0 05/2018: 5.5  Pertinent Imaging: None to review  Assessment & Plan:   In summary, the patient is a 58 year old African-American male with unclear family history of prostate cancer found to have an elevated PSA of 8.0.  He  also has erectile dysfunction.  We reviewed the implications of an elevated PSA and the uncertainty surrounding it. In general, a man's PSA increases with age and is produced by both normal and cancerous prostate tissue. The differential diagnosis for elevated PSA includes BPH, prostate cancer, infection, recent intercourse/ejaculation, recent urethroscopic manipulation (foley placement/cystoscopy) or trauma, and prostatitis.   Management of an elevated PSA can include observation or prostate biopsy and we discussed this in detail. Our goal is to detect clinically significant prostate cancers, and manage with either active surveillance, surgery, or radiation for localized disease. Risks of prostate biopsy include bleeding, infection (including life threatening sepsis), pain, and lower urinary symptoms. Hematuria, hematospermia, and blood in the stool are all common after biopsy and can persist up to 4 weeks.   -Trial of tadalafil 20 mg as needed for ED, risks and benefits discussed, good [email or web address] coupon provided -RTC for prostate biopsy  Billey Co, MD  [facility] [address], [place], [place] [address] [phone]

## 2018-08-12 LAB — PSA, TOTAL AND FREE
PSA FREE PCT: 14.2 %
PSA FREE: 1.11 ng/mL
Prostate Specific Ag, Serum: 7.8 ng/mL — ABNORMAL HIGH (ref 0.0–4.0)

## 2018-08-20 ENCOUNTER — Ambulatory Visit (INDEPENDENT_AMBULATORY_CARE_PROVIDER_SITE_OTHER): Payer: Medicaid Other | Admitting: Urology

## 2018-08-20 ENCOUNTER — Other Ambulatory Visit: Payer: Medicaid Other | Admitting: Urology

## 2018-08-20 ENCOUNTER — Other Ambulatory Visit: Payer: Self-pay | Admitting: Urology

## 2018-08-20 ENCOUNTER — Encounter: Payer: Self-pay | Admitting: Urology

## 2018-08-20 VITALS — BP 115/72 | HR 103 | Ht 71.0 in | Wt 173.0 lb

## 2018-08-20 DIAGNOSIS — R972 Elevated prostate specific antigen [PSA]: Secondary | ICD-10-CM

## 2018-08-20 MED ORDER — LEVOFLOXACIN 500 MG PO TABS
500.0000 mg | ORAL_TABLET | Freq: Once | ORAL | Status: AC
  Administered 2018-08-20: 500 mg via ORAL

## 2018-08-20 MED ORDER — GENTAMICIN SULFATE 40 MG/ML IJ SOLN
80.0000 mg | Freq: Once | INTRAMUSCULAR | Status: AC
  Administered 2018-08-20: 80 mg via INTRAMUSCULAR

## 2018-08-20 NOTE — Progress Notes (Signed)
   08/20/18  Indication: Elevated PSA, 8.0  Prostate Biopsy Procedure   Informed consent was obtained, and we discussed the risks of bleeding and infection/sepsis. A time out was performed to ensure correct patient identity.  Pre-Procedure: - Last PSA Level: 8.0 - Gentamicin and levaquin given for antibiotic prophylaxis - DRE with prominent sulcus - Transrectal Ultrasound performed revealing a 27 gm prostate, PSA density 0.30 - Hypoechoic region right base  Procedure: - Prostate block performed using 10 cc 1% lidocaine and biopsies taken from sextant areas, a total of 12 under ultrasound guidance.  Post-Procedure: - Patient tolerated the procedure well - He was counseled to seek immediate medical attention if experiences significant bleeding, fevers, or severe pain - Return in one week to discuss biopsy results  Assessment/ Plan: Will follow up in 1-2 weeks to discuss pathology  Nickolas Madrid, MD 08/20/2018

## 2018-08-26 ENCOUNTER — Other Ambulatory Visit: Payer: Self-pay | Admitting: Urology

## 2018-08-26 LAB — PATHOLOGY REPORT

## 2018-09-08 ENCOUNTER — Ambulatory Visit: Payer: Medicaid Other | Admitting: Urology

## 2018-09-08 ENCOUNTER — Encounter: Payer: Self-pay | Admitting: Urology

## 2018-09-08 VITALS — BP 95/60 | HR 109 | Ht 71.0 in | Wt 172.9 lb

## 2018-09-08 DIAGNOSIS — C61 Malignant neoplasm of prostate: Secondary | ICD-10-CM | POA: Diagnosis not present

## 2018-09-08 NOTE — Patient Instructions (Signed)
Brachytherapy for Prostate Cancer Brachytherapy for prostate cancer is radiation treatment that is placed inside of the prostate (prostate gland). There are several types of brachytherapy:  Low-dose rate (LDR) therapy. This may involve temporary implants or permanent radioactive seed or pellet implants. The radiation does not travel far from the prostate, which means that healthy, noncancerous tissues around the prostate receive only a small dose of radiation. This helps to protect those tissues from injury. This type of treatment may be followed by a course of external beam radiation. ? Temporary low-dose implants are left in the prostate for 1-7 days. The implants are needles, applicators, or thin, plastic tubes (catheters) that contain radioactive material. You will need to stay in the hospital while the implant is in place. ? Permanent low-dose implants (seeds or pellets) are injected into the prostate, and they work for up to one year after they are inserted. They are left in place and are not removed.  High-dose rate (HDR) therapy. This is given through needles, applicators, or catheters that contain radioactive material. The tubes are removed after treatment, and no radiation is left in the prostate. This type of treatment may be followed by a course of external beam radiation. Tell a health care provider about:  Any allergies you have.  All medicines you are taking, including vitamins, herbs, eye drops, creams, and over-the-counter medicines.  Any problems you or family members have had with anesthetic medicines.  Any surgeries you have had.  Any blood disorders you have.  Any medical conditions you have. What are the risks? Generally, this is a safe procedure. However, problems may occur, including:  Inflammation of the rectum.  Problems getting or keeping an erection (erectile dysfunction).  Trouble urinating.  Diarrhea.  Bleeding.  Loss of bowel control. What happens  before the procedure? Staying hydrated Follow instructions from your health care provider about hydration, which may include:  Up to 2 hours before the procedure - you may continue to drink clear liquids, such as water, clear fruit juice, black coffee, and plain tea. Eating and drinking Follow instructions from your health care provider about eating and drinking, which may include:  8 hours before the procedure - stop eating heavy meals or foods such as meat, fried foods, or fatty foods.  6 hours before the procedure - stop eating light meals or foods, such as toast or cereal.  6 hours before the procedure - stop drinking milk or drinks that contain milk.  2 hours before the procedure - stop drinking clear liquids. Medicines  Ask your health care provider about: ? Changing or stopping your regular medicines. This is especially important if you are taking diabetes medicines or blood thinners. ? Taking medicines such as aspirin and ibuprofen. These medicines can thin your blood. Do not take these medicines before your procedure if your health care provider instructs you not to.  You may be given antibiotic medicine to help prevent infection. General instructions  Plan to have someone take you home from the hospital or clinic.  If you will be going home right after the procedure, plan to have someone with you for 24 hours.  You may have imaging tests done, including an ultrasound, CT scan, or MRI.  You may have blood tests done.  You may have a test to check the electrical signals in your heart (electrocardiogram).  You may need to take medicine to clean out your bowel (bowel prep). What happens during the procedure?  To lower your risk of  infection: ? Your health care team will wash or sanitize their hands. ? Your skin will be washed with soap. ? Hair may be removed from the surgical area.  An IV will be inserted into one of your veins.  You will be given one or more of the  following: ? A medicine to help you relax (sedative). ? A medicine to numb the area (local anesthetic). ? A medicine to make you fall asleep (general anesthetic).  You may have a thin, plastic tube (catheter) inserted to drain your bladder.  If you are receiving brachytherapy with implants: ? A needle, applicator, or catheter will be inserted into the prostate. It will be inserted through a body cavity, such as the rectum, or through the tissue between the testicles and the anus (perineum). ? An X-ray, ultrasound, MRI, or CT scan will be used to guide the catheter or applicator toward the prostate. ? Radioactive seeds, wires, or ribbons will be fed through the catheter or applicator. ? If the high-dose method is used: ? The radioactive wires or ribbons will be left in for a few minutes and then removed. ? Once the treatment is finished, the catheter or applicator will be removed. ? If the low-dose method is used, the implant will stay in place for 1-7 days. ? You will remain in the hospital while the implant is in place. ? Once the treatment is finished, the radioactive material and catheter will be removed.  If you are receiving permanent, low-dose brachytherapy: ? Small, radioactive seeds or pellets will be injected into your prostate. This may be done through a catheter, needle, or applicator. ? The catheter or applicator will be removed, leaving the seeds in the prostate. The procedure may vary among health care providers and hospitals. What happens after the procedure?  Your blood pressure, heart rate, breathing rate, and blood oxygen level will be monitored until the medicines you were given have worn off.  Do not drive for 24 hours if you were given a sedative. Summary  Brachytherapy for prostate cancer is radiation treatment placed inside of the prostate (prostate gland).  There are several types of brachytherapy for prostate cancer, including low-dose temporary treatment,  low-dose permanent treatment, and high-dose temporary treatment.  Temporary low-dose implants are left in the prostate for 1-7 days.  Permanent low-dose implants are injected into the prostate and left in place. They work for up to one year after they are inserted.  Permanent high-dose therapy is given through tubes that contain radioactive material. The tubes are removed after treatment, and no radiation is left in the prostate. This information is not intended to replace advice given to you by your health care provider. Make sure you discuss any questions you have with your health care provider. Document Released: 12/17/2005 Document Revised: 07/18/2016 Document Reviewed: 07/18/2016 Elsevier Interactive Patient Education  2019 [employer].   External Beam Radiation Therapy External beam radiation therapy is a type of radiation treatment. This type of radiation therapy can deliver radiation to a fairly large area. This is the most common type of radiation therapy for cancer. This therapy may be done to:  Treat cancer by: ? Destroying cancer cells. Radiation delivered during the treatment damages cancer cells. It may also damage normal cells, but normal cells have the DNA to repair themselves while cancer cells do not. ? Helping with symptoms of your cancer. ? Stopping the growth of any remaining cancer cells after surgery. ? Preventing cancer cells from growing in areas  that do not have cancer (prophylactic radiation therapy).  Treat or shrink a tumor.  Reduce pain (palliative therapy). The amount of radiation you will receive and the length of therapy depend on your medical condition. You should not feel the radiation being delivered or any pain during your therapy. Let your health care provider know about:  Any allergies you have.  All medicines you are taking, including vitamins, herbs, eye drops, creams, and over-the-counter medicines.  Any problems you or family members have  had with anesthetic medicines.  Any blood disorders you have.  Any surgeries you have had.  Any medical conditions you have.  Whether you are pregnant or may be pregnant. What are the risks? Generally, this is a safe procedure. However, radiation therapy can place a person at a higher risk for a second cancer later in life. Most people experience side effects from the therapy. Side effects depend on the amount of radiation and the part of the body that was exposed to radiation. The most common side effects include:  Skin changes.  Hair loss.  Fatigue.  Nausea and vomiting. What happens before the procedure?  There will be a planning session (simulation). During the session: ? Your health care provider will plan exactly where the radiation will be delivered (treatment field). ? You will be positioned for your therapy. The goal is to have a position that can be reproduced for each therapy session. ? Temporary marks may be drawn on your body. Permanent marks may also be drawn on your body in order for you to be positioned the same way for each therapy session. ? A tool that holds a body part in place (immobilization device) may be used to keep the area of treatment in the correct position.  Follow instructions from your health care provider about eating or drinking restrictions.  Ask your health care provider about changing or stopping your regular medicines. This is especially important if you are taking diabetes medicines or blood thinners. What happens during the procedure?   You will either lie on a table or sit in a chair in the position determined for your therapy.  You may have a heavy shield placed on you to protect tissues and organs that are not being treated.  The radiation machine (linear accelerator) will move around you to deliver the radiation in exact doses from different angles. The machine will not touch you. The procedure may vary among health care providers and  hospitals. What happens after the procedure?  You may return to your normal routine including diet, activities, and medicines as told by your health care provider.  You may want to have someone take you home from the hospital or clinic. Summary  External beam radiation therapy is a type of radiation treatment for cancer.  The amount of radiation you will receive and the length of therapy depend on your medical condition.  Most people experience side effects from the therapy. Side effects depend on the amount of radiation and the part of the body that was exposed to radiation. This information is not intended to replace advice given to you by your health care provider. Make sure you discuss any questions you have with your health care provider. Document Released: 11/25/2008 Document Revised: 07/09/2016 Document Reviewed: 07/09/2016 Elsevier Interactive Patient Education  2019 [employer].

## 2018-09-08 NOTE — Progress Notes (Signed)
   09/08/2018 11:43 AM   [name] [id number]  Reason for visit: Discuss prostate biopsy results  HPI: I saw Mr. Devilla back in urology clinic to discuss his prostate biopsy results.  Briefly he is a healthy 58 year old African-American male who underwent prostate biopsy on 08/20/2018 for an elevated PSA of 8.0.  This showed a 27 g prostate with a PSA density of 0.30.  Pathology showed favorable intermediate risk prostate cancer, pathology report attached below.      He denies any significant urinary symptoms.  He does have erectile dysfunction that is responsive to Cialis.  We had a lengthy conversation today about the patient's new diagnosis of prostate cancer.  We reviewed the risk classifications per the AUA guidelines including very low risk, low risk, intermediate risk, and high risk disease, and the need for additional staging imaging with CT and bone scan in patients with unfavorable intermediate risk and high risk disease.  I explained that his life expectancy, clinical stage, Gleason score, PSA, and other co-morbidities influence treatment strategies.  We discussed the roles of active surveillance, radiation therapy, surgical therapy with robotic prostatectomy, and hormone therapy with androgen deprivation.  We discussed that patients urinary symptoms also impact treatment strategy, as patients with severe lower urinary tract symptoms may have significant worsening or even develop urinary retention after undergoing radiation.  In regards to surgery, we discussed robotic prostatectomy +/- lymphadenectomy at length.  The procedure takes 3 to 4 hours, and patient's typically discharge home on post-op day #1.  A Foley catheter is left in place for 7 to 10 days to allow for healing of the vesicourethral anastomosis.  There is a small risk of bleeding, infection, damage to surrounding structures or bowel, hernia, DVT/PE, or serious cardiac or pulmonary complications.  We  discussed at length post-op side effects including erectile dysfunction, and the importance of pre-operative erectile function on long-term outcomes.  Even with a nerve sparing approach, there is an approximately 25% rate of permanent erectile dysfunction.  We also discussed postop urinary incontinence at length.  We expect patients to have stress incontinence post-operatively that will improve over period of weeks to months.  Less than 10% of men will require a pad at 1 year after surgery.  Patients will need to avoid heavy lifting and strenuous activity for 3 to 4 weeks, but most men return to their baseline activity status by 6 weeks.  In summary, Thurlow Gallaga is a 58 y.o. man with newly diagnosed favorable intermediate risk prostate cancer.  I do feel he would be an excellent candidate for robotic prostatectomy, however the patient is adamantly opposed to any surgical intervention.  I did discuss that he may need to undergo minor procedures in the future in anticipation of radiation in terms of gold seed placement or prostate volume measurement for brachytherapy.  He understands this.  Referral to radiation oncology placed RTC 9 months for PSA  A total of 25 minutes were spent face-to-face with the patient, greater than 50% was spent in patient education, counseling, and coordination of care regarding new diagnosis of favorable intermediate risk prostate cancer and treatment options.  Billey Co, [facility] [address], [place], [place] [address] [phone]

## 2018-09-17 ENCOUNTER — Ambulatory Visit
Admission: RE | Admit: 2018-09-17 | Discharge: 2018-09-17 | Disposition: A | Discharge status: Home / Self Care | Payer: Medicaid Other | Source: Ambulatory Visit | Attending: Radiation Oncology | Admitting: Radiation Oncology

## 2018-09-17 ENCOUNTER — Encounter: Payer: Self-pay | Admitting: Radiation Oncology

## 2018-09-17 ENCOUNTER — Other Ambulatory Visit: Payer: Self-pay

## 2018-09-17 VITALS — BP 119/73 | HR 113 | Temp 96.4°F | Resp 18 | Wt 172.7 lb

## 2018-09-17 DIAGNOSIS — G4730 Sleep apnea, unspecified: Secondary | ICD-10-CM | POA: Insufficient documentation

## 2018-09-17 DIAGNOSIS — J449 Chronic obstructive pulmonary disease, unspecified: Secondary | ICD-10-CM | POA: Insufficient documentation

## 2018-09-17 DIAGNOSIS — I10 Essential (primary) hypertension: Secondary | ICD-10-CM | POA: Diagnosis not present

## 2018-09-17 DIAGNOSIS — F419 Anxiety disorder, unspecified: Secondary | ICD-10-CM | POA: Insufficient documentation

## 2018-09-17 DIAGNOSIS — F17210 Nicotine dependence, cigarettes, uncomplicated: Secondary | ICD-10-CM | POA: Insufficient documentation

## 2018-09-17 DIAGNOSIS — M109 Gout, unspecified: Secondary | ICD-10-CM | POA: Insufficient documentation

## 2018-09-17 DIAGNOSIS — C61 Malignant neoplasm of prostate: Secondary | ICD-10-CM | POA: Diagnosis not present

## 2018-09-17 DIAGNOSIS — R000 Tachycardia, unspecified: Secondary | ICD-10-CM | POA: Diagnosis not present

## 2018-09-17 DIAGNOSIS — N529 Male erectile dysfunction, unspecified: Secondary | ICD-10-CM | POA: Insufficient documentation

## 2018-09-17 DIAGNOSIS — Z79899 Other long term (current) drug therapy: Secondary | ICD-10-CM | POA: Insufficient documentation

## 2018-09-17 DIAGNOSIS — K219 Gastro-esophageal reflux disease without esophagitis: Secondary | ICD-10-CM | POA: Insufficient documentation

## 2018-09-17 NOTE — Consult Note (Signed)
NEW PATIENT EVALUATION  Name: Clarence Skinner  MRN: [id number]  Date:   09/17/2018     DOB: July 19, 1961   This 58 y.o. 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  REFERRING PHYSICIAN: Perrin Maltese, MD  CHIEF COMPLAINT:  Chief Complaint  Patient presents with  . Prostate Cancer    Initial consultation of prostate cancer    DIAGNOSIS: The encounter diagnosis was Malignant neoplasm of prostate ([place]).   PREVIOUS INVESTIGATIONS:  Pathology reports reviewed CT scan of the chest reviewed Clinical notes reviewed   HPI: patient is a 58 year old male who presented with an elevated PSA in the 8 range.Rectal exam was unremarkable he had a 27 g prostate. He underwent transrectal ultrasound-guided biopsy . Pathology showed mostly confined to the left lateral lobe 5 out of 12 cores positive for mostly Gleason 7 (3+4) adenocarcinoma. Patient does have erectile dysfunction. He has no significant frequency urgency has nocturia 2-3. He's been seen by urology and treatment recommended she does have been reviewed.. Patient is adamant about refusing surgery.He is seen today for radiation oncology consultation.  PLANNED TREATMENT REGIMEN: I-125 interstitial implant  PAST MEDICAL HISTORY:  has a past medical history of COPD (chronic obstructive pulmonary disease) ([place]), GERD (gastroesophageal reflux disease), Gout, Hypertension, Multilevel degenerative disc disease, Pneumonia (11/29/2017), Sleep apnea, Tachycardia, and Wears dentures.    PAST SURGICAL HISTORY:  Past Surgical History:  Procedure Laterality Date  . ABCESS DRAINAGE  2009   tonsil  . COLONOSCOPY WITH PROPOFOL N/A 02/11/2018   Procedure: COLONOSCOPY WITH PROPOFOL;  Surgeon: Toledo, Benay Pike, MD;  Location: [facility] ENDOSCOPY;  Service: Endoscopy;  Laterality: N/A;  . ESOPHAGOGASTRODUODENOSCOPY (EGD) WITH  PROPOFOL N/A 02/11/2018   Procedure: ESOPHAGOGASTRODUODENOSCOPY (EGD) WITH PROPOFOL;  Surgeon: Toledo, Benay Pike, MD;  Location: [facility] ENDOSCOPY;  Service: Endoscopy;  Laterality: N/A;  . MASS EXCISION Right 08/14/2017   Procedure: EXCISION RIGHT TEMPORAL MASS SUBMENTAL MASS;  Surgeon: Carloyn Manner, MD;  Location: [facility];  Service: ENT;  Laterality: Right;  sleep apnea  . SEPTOPLASTY  2016   [place], [place]    FAMILY HISTORY: family history includes Anxiety disorder in his mother; Asthma in his sister; Diabetes in his sister; Gout in his brother; Hypertension in his brother and sister; Other in his brother.  SOCIAL HISTORY:  reports that he has been smoking cigarettes. He has a 30.00 pack-year smoking history. He quit smokeless tobacco use about 33 years ago.  His smokeless tobacco use included snuff and chew. He reports current drug use. He reports that he does not drink alcohol.  ALLERGIES: Chocolate; Eggs or egg-derived products; and Milk-related compounds  MEDICATIONS:  Current Outpatient Medications  Medication Sig Dispense Refill  . albuterol (PROVENTIL HFA;VENTOLIN HFA) 108 (90 Base) MCG/ACT inhaler Inhale into the lungs every 6 (six) hours as needed for wheezing or shortness of breath.    . allopurinol (ZYLOPRIM) 300 MG tablet Take 300 mg by mouth daily.    [name] amLODipine-benazepril (LOTREL) 10-40 MG capsule Take 1 capsule by mouth daily.    . Azelastine HCl 137 MCG/SPRAY SOLN Place into the nose daily.    . baclofen (LIORESAL) 10 MG tablet Take 10 mg by mouth 2 (two) times daily.     . diazepam (VALIUM) 5 MG tablet Take 1 tablet (5 mg total) by mouth once as needed for up to 1 dose  for anxiety. Take 30 minutes prior to prostate biopsy 1 tablet 0  . ferrous sulfate 325 (65 FE) MG tablet Take 325 mg by mouth daily.  3  . fluticasone (FLONASE) 50 MCG/ACT nasal spray Place into both nostrils daily as needed for allergies or rhinitis.    . fluticasone (FLOVENT HFA) 220  MCG/ACT inhaler Inhale 1 puff into the lungs daily.    . Multiple Vitamin (MULTIVITAMIN) tablet Take 1 tablet by mouth daily.    . pantoprazole (PROTONIX) 40 MG tablet Take 40 mg by mouth daily.    [name] tiotropium (SPIRIVA) 18 MCG inhalation capsule Place 18 mcg into inhaler and inhale daily.    [name] tiZANidine (ZANAFLEX) 4 MG tablet Take 4 mg by mouth 3 (three) times daily as needed for muscle spasms.     No current facility-administered medications for this encounter.     ECOG PERFORMANCE STATUS:  0 - Asymptomatic  REVIEW OF SYSTEMS:  Patient denies any weight loss, fatigue, weakness, fever, chills or night sweats. Patient denies any loss of vision, blurred vision. Patient denies any ringing  of the ears or hearing loss. No irregular heartbeat. Patient denies heart murmur or history of fainting. Patient denies any chest pain or pain radiating to her upper extremities. Patient denies any shortness of breath, difficulty breathing at night, cough or hemoptysis. Patient denies any swelling in the lower legs. Patient denies any nausea vomiting, vomiting of blood, or coffee ground material in the vomitus. Patient denies any stomach pain. Patient states has had normal bowel movements no significant constipation or diarrhea. Patient denies any dysuria, hematuria or significant nocturia. Patient denies any problems walking, swelling in the joints or loss of balance. Patient denies any skin changes, loss of hair or loss of weight. Patient denies any excessive worrying or anxiety or significant depression. Patient denies any problems with insomnia. Patient denies excessive thirst, polyuria, polydipsia. Patient denies any swollen glands, patient denies easy bruising or easy bleeding. Patient denies any recent infections, allergies or URI. Patient "s visual fields have not changed significantly in recent time.    PHYSICAL EXAM: BP 119/73 (BP Location: Left Arm, Patient Position: Sitting)   Pulse (!) 113   Temp (!)  96.4 F (35.8 C) (Tympanic)   Resp 18   Wt 172 lb 11.7 oz (78.4 kg)   BMI 24.09 kg/m  On rectal exam rectal sphincter tone is good. Prostate is smooth contracted without evidence of nodularity or mass sulcus appears reserved bilaterally.Well-developed well-nourished patient in NAD. HEENT reveals PERLA, EOMI, discs not visualized.  Oral cavity is clear. No oral mucosal lesions are identified. Neck is clear without evidence of cervical or supraclavicular adenopathy. Lungs are clear to A&P. Cardiac examination is essentially unremarkable with regular rate and rhythm without murmur rub or thrill. Abdomen is benign with no organomegaly or masses noted. Motor sensory and DTR levels are equal and symmetric in the upper and lower extremities. Cranial nerves II through XII are grossly intact. Proprioception is intact. No peripheral adenopathy or edema is identified. No motor or sensory levels are noted. Crude visual fields are within normal range.  LABORATORY DATA: pathology reports reviewed    RADIOLOGY RESULTS:no bone scan is indicated based on his PSA   IMPRESSION: stage IIa adenocarcinoma the prostate in14 year old male who refuses surgical intervention.  PLAN: at this time again gone over treatment recommendations including robotic prostatectomy versus external beam radiation therapy as well as prostate implant. Patient is adamant about refusing surgical intervention. I believe his  next best option would be I-125 interstitial implant. Risks and benefits of implant including risks of general anesthesia fatigue increase of lower urinary tract symptoms possible diarrhea and radiation safety precautions all were reviewed with the patient in detail. I've explained to him the process of going through volume study and will make arrangements through urology to have that scheduled. Patient Has my treatment plan well. I would like to take this opportunity to thank you for allowing me to participate in the care of  your patient.Noreene Filbert, MD

## 2018-09-29 ENCOUNTER — Other Ambulatory Visit: Payer: Self-pay | Admitting: Radiology

## 2018-09-29 DIAGNOSIS — C61 Malignant neoplasm of prostate: Secondary | ICD-10-CM

## 2018-10-23 ENCOUNTER — Other Ambulatory Visit: Payer: Self-pay | Admitting: *Deleted

## 2018-10-23 DIAGNOSIS — C61 Malignant neoplasm of prostate: Secondary | ICD-10-CM

## 2018-10-23 MED ORDER — LEUPROLIDE ACETATE (6 MONTH) 45 MG IM KIT: 45.0000 mg | PACK | Freq: Once | INTRAMUSCULAR | Status: DC

## 2018-10-24 ENCOUNTER — Other Ambulatory Visit: Payer: Self-pay

## 2018-10-27 ENCOUNTER — Other Ambulatory Visit: Payer: Self-pay

## 2018-10-27 ENCOUNTER — Inpatient Hospital Stay: Payer: Medicaid Other | Attending: Radiation Oncology

## 2018-10-27 DIAGNOSIS — C61 Malignant neoplasm of prostate: Secondary | ICD-10-CM | POA: Diagnosis present

## 2018-10-27 DIAGNOSIS — R918 Other nonspecific abnormal finding of lung field: Secondary | ICD-10-CM | POA: Insufficient documentation

## 2018-10-27 MED ORDER — LEUPROLIDE ACETATE (6 MONTH) 45 MG IM KIT
45.0000 mg | PACK | Freq: Once | INTRAMUSCULAR | Status: AC
  Administered 2018-10-27: 45 mg via INTRAMUSCULAR
  Filled 2018-10-27: qty 45

## 2018-11-17 ENCOUNTER — Ambulatory Visit: Payer: Medicaid Other | Admitting: Radiation Oncology

## 2018-11-17 ENCOUNTER — Ambulatory Visit: Admit: 2018-11-17 | Payer: Medicaid Other | Admitting: Urology

## 2018-11-17 SURGERY — ULTRASOUND, PROSTATE, FOR VOLUME DETERMINATION
Anesthesia: Choice

## 2018-11-27 ENCOUNTER — Inpatient Hospital Stay: Admission: RE | Admit: 2018-11-27 | Payer: Medicaid Other | Source: Ambulatory Visit

## 2018-11-30 IMAGING — MR MR CERVICAL SPINE W/O CM
5 series · 34 of 48 positions shown · non-contrast
Comparison: Plain film cervical spine 10/09/2013.

CLINICAL DATA: Neck pain and stiffness. Bilateral hand numbness and
weakness. Symptoms are chronic. No known injury.

EXAM:
MRI CERVICAL SPINE WITHOUT CONTRAST
TECHNIQUE: Multiplanar, multisequence MR imaging of the cervical spine was
performed. No intravenous contrast was administered.

[Series 2: T2 · sagittal · 3.0mm · 0.56mm/px · 8 of 13 slices shown (1 of 2)]
[im 1/13]
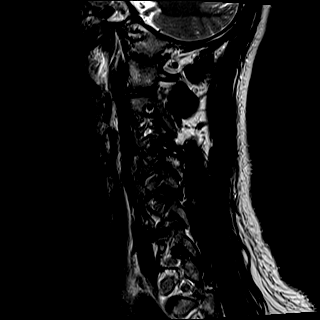
[im 2/13]
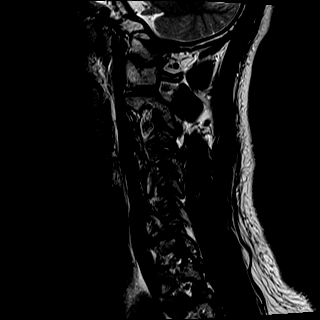
[im 4/13]
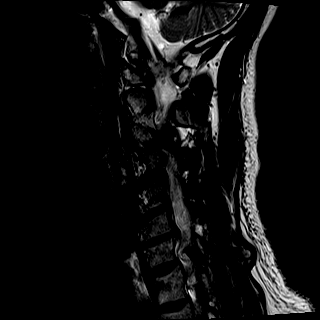
[im 6/13]
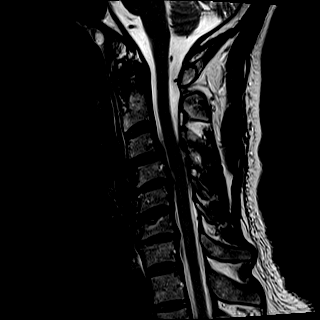
[im 7/13]
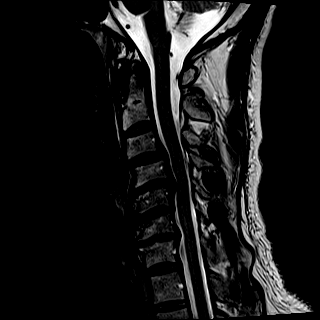
[im 9/13]
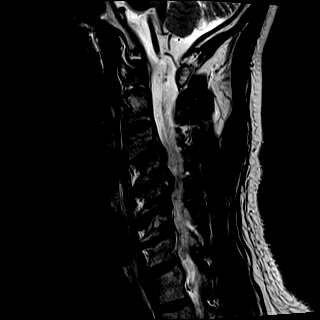
[im 11/13]
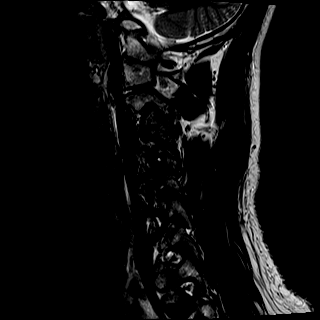
[im 13/13]
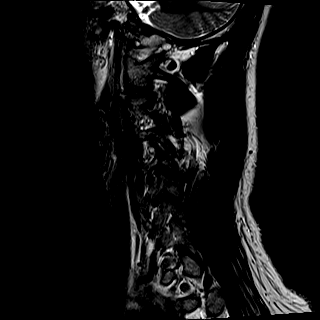

[Series 4: STIR · sagittal · 3.0mm · 0.35mm/px · 7 of 13 slices shown]
[im 1/13]
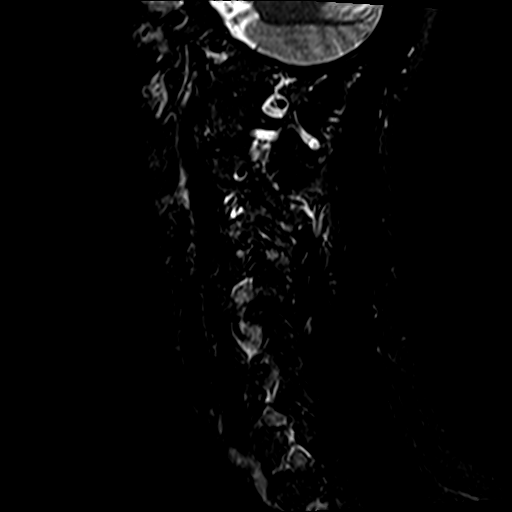
[im 3/13]
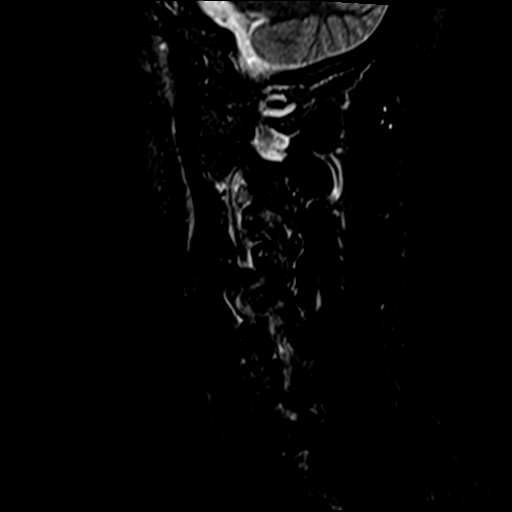
[im 5/13]
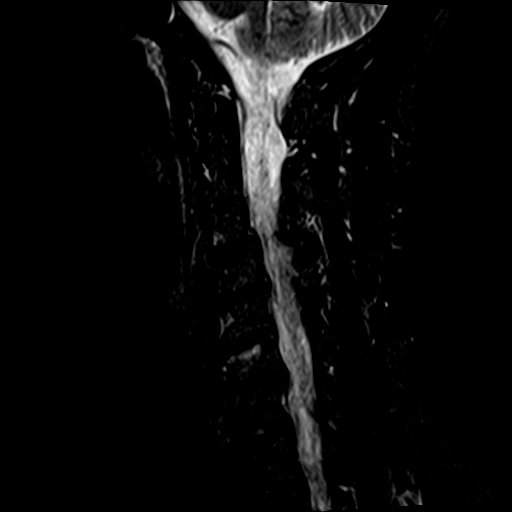
[im 7/13]
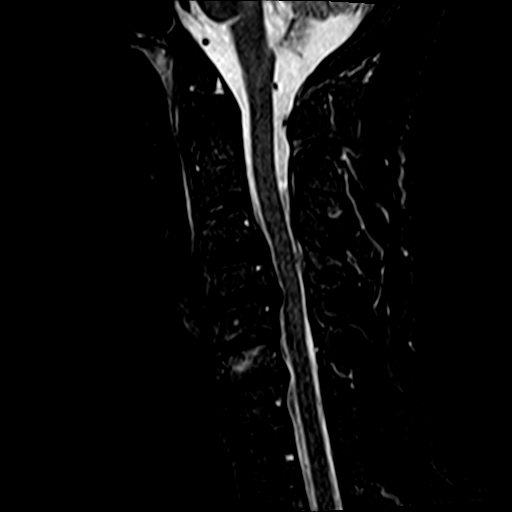
[im 9/13]
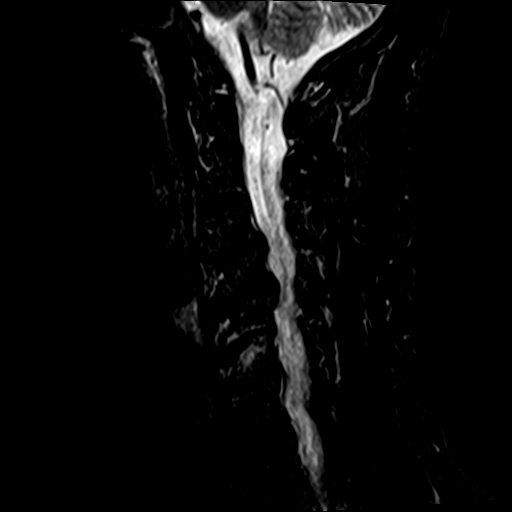
[im 11/13]
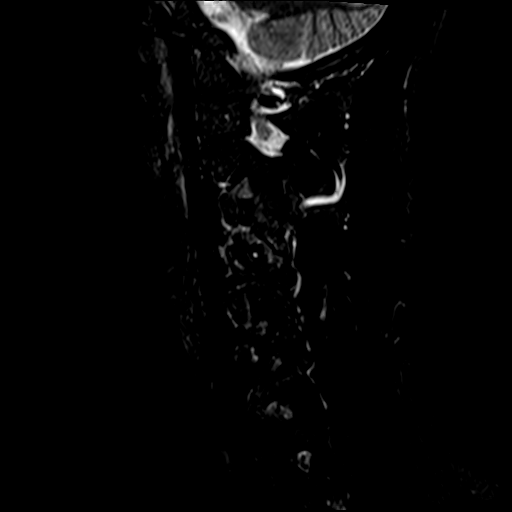
[im 13/13]
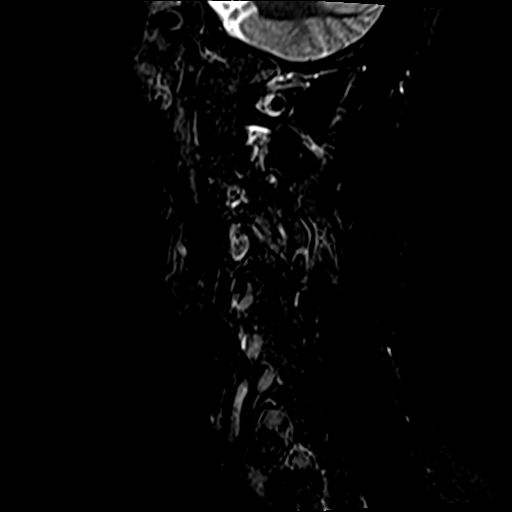

[Series 5: T2 · axial · 3.0mm · 0.62mm/px · z∈[-87,+3]mm · 9 of 25 slices shown (2 of 2)]
[im 1/25]
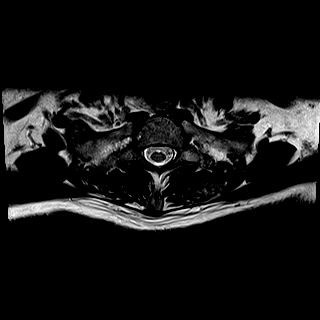
[im 5/25]
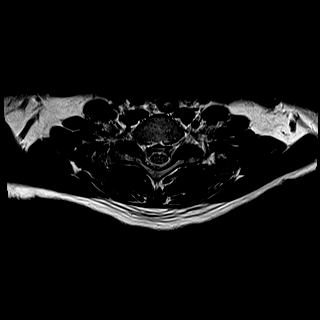
[im 9/25]
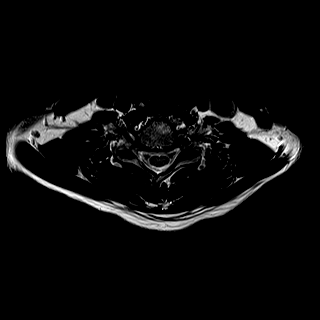
[im 11/25]
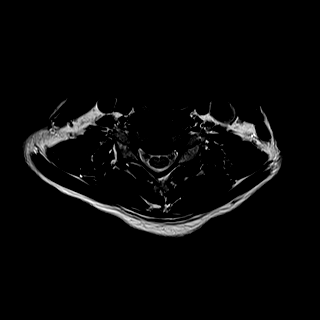
[im 13/25]
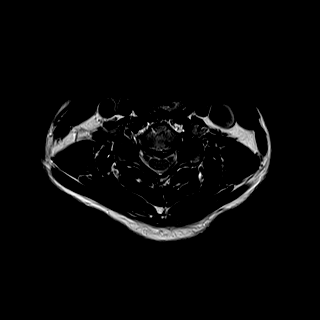
[im 15/25]
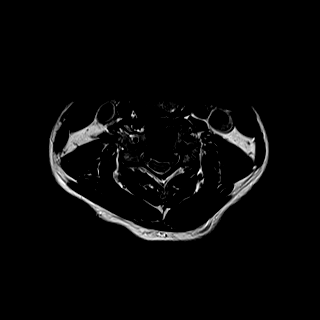
[im 17/25]
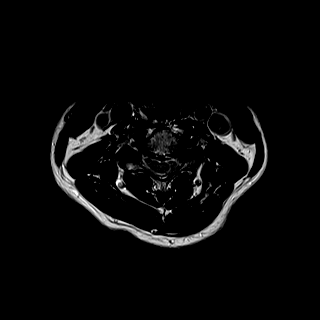
[im 21/25]
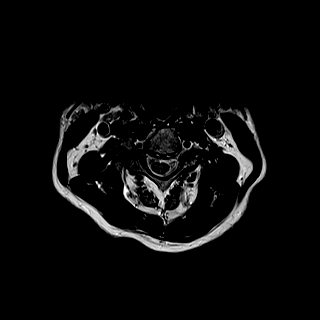
[im 25/25]
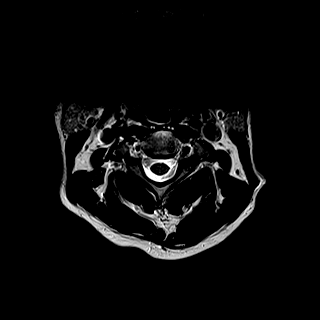

[Series 6: mpgr ax · axial · 3.0mm · 0.39mm/px · z∈[-81,-50]mm · 3 of 25 slices shown]
[im 1/25]
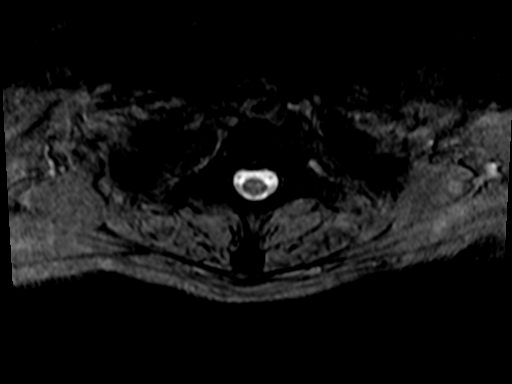
[im 5/25]
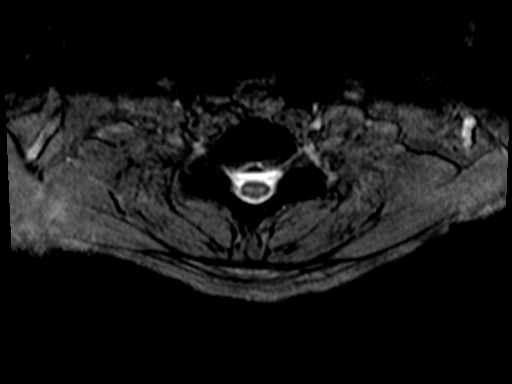
[im 9/25]
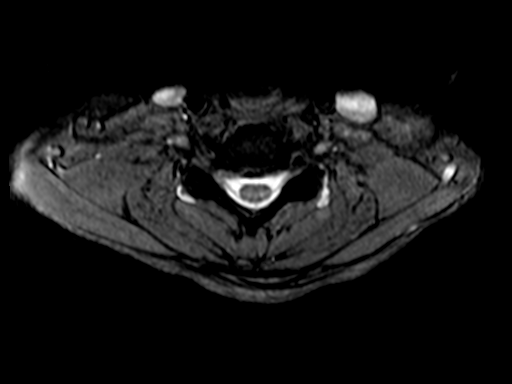

[Series 100: T1 · sagittal · 3.0mm · 0.70mm/px · 7 of 13 slices shown]
[im 1/13]
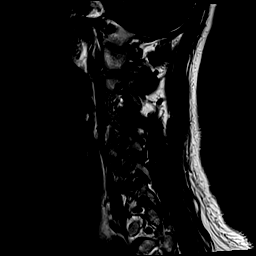
[im 3/13]
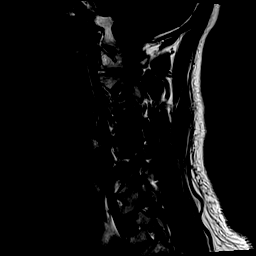
[im 5/13]
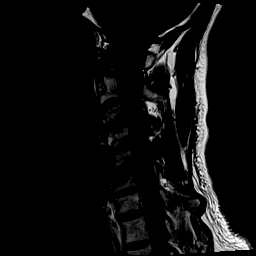
[im 7/13]
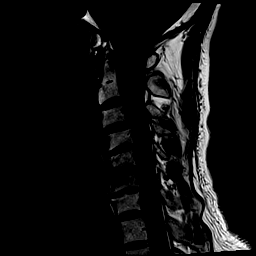
[im 9/13]
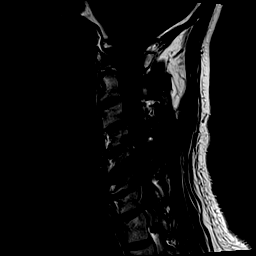
[im 11/13]
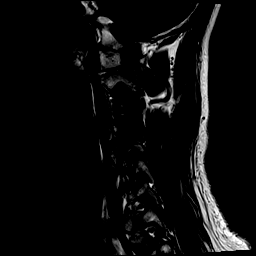
[im 13/13]
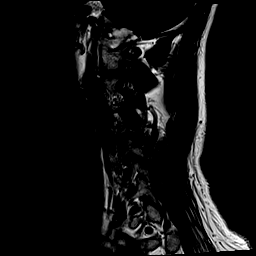

[34 of 48 positions shown; findings below may reference images not displayed]

FINDINGS: Alignment: C4 vertebral body is 0.2 cm retrolisthesis relative to C3
and C5. There straightening of the normal cervical lordosis.

Vertebrae: No fracture or worrisome lesion. Degenerative endplate
signal change is present at C5-6.

Cord: Normal signal throughout.

Posterior Fossa, vertebral arteries, paraspinal tissues: Negative.

Disc levels:

C2-3: Negative.

C3-4: Right worse than left facet degenerative change is present.
The patient has a disc bulge slightly more prominent to the left.
The cord is slightly deflected to the right. Uncovertebral and facet
degeneration cause moderately severe bilateral foraminal narrowing.

C4-5: The patient has a broad-based left paracentral protrusion. The
disc flattens the cord and deflects the cord to the right.
Uncovertebral disease causes severe left and moderately severe right
foraminal narrowing.

C5-6: There is a shallow disc osteophyte complex and some
uncovertebral spurring. The central canal is open. Mild to moderate
foraminal narrowing is worse on the left.

C6-7: The patient has a disc bulge and uncovertebral disease. The
ventral thecal sac is narrowed but not effaced. The central canal is
open. Moderate to moderately severe foraminal narrowing is worse on
the right.

C7-T1: Minimal disc bulge without central canal or foraminal
stenosis. Facet arthropathy on the right noted.
IMPRESSION: Multilevel spondylosis appears worst at C4-5 where there is
flattening of the cord due to a broad-based left paracentral
protrusion. Severe left and moderately severe right foraminal
narrowing is also seen at this level.

Disc bulge to the left at C3-4 slightly deflects the cord to the
right. Uncovertebral and facet degeneration cause moderately severe
bilateral foraminal narrowing at this level.

Moderate to moderately severe bilateral foraminal narrowing at C6-7
is worse on the right. The central canal remains open at this level.

## 2018-12-08 ENCOUNTER — Ambulatory Visit: Admit: 2018-12-08 | Payer: Medicaid Other | Admitting: Urology

## 2018-12-08 SURGERY — INSERTION, RADIATION SOURCE, PROSTATE
Anesthesia: Choice

## 2018-12-09 ENCOUNTER — Other Ambulatory Visit: Payer: Self-pay | Admitting: Radiology

## 2018-12-09 DIAGNOSIS — C61 Malignant neoplasm of prostate: Secondary | ICD-10-CM

## 2018-12-22 ENCOUNTER — Telehealth: Payer: Self-pay | Admitting: Radiology

## 2018-12-22 ENCOUNTER — Other Ambulatory Visit: Payer: Self-pay | Admitting: Radiology

## 2018-12-22 NOTE — Telephone Encounter (Signed)
Notified patient of prostate volume study scheduled 12/29/2018 with Dr Diamantina Providence with arrival to [facility] at 7:00. Advised patient to have nothing to eat or drink after midnight on the previous night. Notified patient of COVID-19 testing to be done in [facility] on 12/25/2018 at 11:30. Notified patient of Prostate Seed Implant scheduled 01/26/2019 with Pre-admission testing appointment followed by COVID-19 testing on 01/22/2019 at 8:00. Instructed patient to have nothing to eat or drink after midnight on the night prior to Seed Implant procedure. Questions answered. Patient expresses understanding of instructions.

## 2018-12-25 ENCOUNTER — Other Ambulatory Visit
Admission: RE | Admit: 2018-12-25 | Discharge: 2018-12-25 | Disposition: A | Discharge status: Home / Self Care | Payer: Medicaid Other | Source: Ambulatory Visit | Attending: Urology | Admitting: Urology

## 2018-12-25 ENCOUNTER — Other Ambulatory Visit: Payer: Self-pay

## 2018-12-25 DIAGNOSIS — Z1159 Encounter for screening for other viral diseases: Secondary | ICD-10-CM | POA: Insufficient documentation

## 2018-12-26 LAB — NOVEL CORONAVIRUS, NAA (HOSP ORDER, SEND-OUT TO REF LAB; TAT 18-24 HRS): SARS-CoV-2, NAA: NOT DETECTED

## 2018-12-28 MED ORDER — CIPROFLOXACIN IN D5W 400 MG/200ML IV SOLN: 400.0000 mg | INTRAVENOUS | Status: DC

## 2018-12-29 ENCOUNTER — Ambulatory Visit
Admission: RE | Admit: 2018-12-29 | Discharge: 2018-12-29 | Disposition: A | Discharge status: Home / Self Care | Payer: Medicaid Other | Source: Ambulatory Visit | Attending: Radiation Oncology | Admitting: Radiation Oncology

## 2018-12-29 ENCOUNTER — Other Ambulatory Visit: Payer: Self-pay

## 2018-12-29 ENCOUNTER — Ambulatory Visit
Admission: RE | Admit: 2018-12-29 | Discharge: 2018-12-29 | Disposition: A | Discharge status: Home / Self Care | Payer: Medicaid Other | Attending: Urology | Admitting: Urology

## 2018-12-29 ENCOUNTER — Encounter
Admission: RE | Disposition: A | Discharge status: Home / Self Care | Payer: Self-pay | Source: Home / Self Care | Attending: Urology

## 2018-12-29 ENCOUNTER — Encounter: Payer: Self-pay | Admitting: Radiation Oncology

## 2018-12-29 VITALS — BP 129/79 | HR 98 | Temp 96.0°F | Resp 18 | Wt 165.5 lb

## 2018-12-29 DIAGNOSIS — C61 Malignant neoplasm of prostate: Secondary | ICD-10-CM | POA: Insufficient documentation

## 2018-12-29 DIAGNOSIS — Z91012 Allergy to eggs: Secondary | ICD-10-CM | POA: Insufficient documentation

## 2018-12-29 DIAGNOSIS — Z79899 Other long term (current) drug therapy: Secondary | ICD-10-CM | POA: Diagnosis not present

## 2018-12-29 DIAGNOSIS — G4730 Sleep apnea, unspecified: Secondary | ICD-10-CM | POA: Diagnosis not present

## 2018-12-29 DIAGNOSIS — J449 Chronic obstructive pulmonary disease, unspecified: Secondary | ICD-10-CM | POA: Diagnosis not present

## 2018-12-29 DIAGNOSIS — I10 Essential (primary) hypertension: Secondary | ICD-10-CM | POA: Insufficient documentation

## 2018-12-29 DIAGNOSIS — Z91018 Allergy to other foods: Secondary | ICD-10-CM | POA: Insufficient documentation

## 2018-12-29 DIAGNOSIS — Z91011 Allergy to milk products: Secondary | ICD-10-CM | POA: Diagnosis not present

## 2018-12-29 DIAGNOSIS — F17210 Nicotine dependence, cigarettes, uncomplicated: Secondary | ICD-10-CM | POA: Diagnosis not present

## 2018-12-29 DIAGNOSIS — Z8249 Family history of ischemic heart disease and other diseases of the circulatory system: Secondary | ICD-10-CM | POA: Insufficient documentation

## 2018-12-29 HISTORY — PX: VOLUME STUDY: SHX6646

## 2018-12-29 SURGERY — ULTRASOUND, PROSTATE, FOR VOLUME DETERMINATION
Anesthesia: Choice

## 2018-12-29 NOTE — H&P (Deleted)
  The note originally documented on this encounter has been moved the the encounter in which it belongs.  

## 2018-12-29 NOTE — Progress Notes (Signed)
Radiation Oncology Volume study note  Name: Clarence Skinner   Date:   12/29/2018 MRN:  [id number] DOB: 12/08/1960    This 58 y.o. male presents to the hospital today for volume study in anticipation of an I-125 interstitial implant.  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  He has been on androgen deprivation therapy with Lupron 6 in anticipation of a volume study and anticipation of an I-125 interstitial implant.  He was seen today and taken to the OR for volume study..  COMPLICATIONS OF TREATMENT: none  FOLLOW UP COMPLIANCE: keeps appointments   PHYSICAL EXAM:  There were no vitals taken for this visit. Well-developed well-nourished patient in NAD. HEENT reveals PERLA, EOMI, discs not visualized.  Oral cavity is clear. No oral mucosal lesions are identified. Neck is clear without evidence of cervical or supraclavicular adenopathy. Lungs are clear to A&P. Cardiac examination is essentially unremarkable with regular rate and rhythm without murmur rub or thrill. Abdomen is benign with no organomegaly or masses noted. Motor sensory and DTR levels are equal and symmetric in the upper and lower extremities. Cranial nerves II through XII are grossly intact. Proprioception is intact. No peripheral adenopathy or edema is identified. No motor or sensory levels are noted. Crude visual fields are within normal range.  RADIOLOGY RESULTS: Ultrasound used for volume study  PLAN: Patient was taken to the cystoscopy suite in the OR. Patient was placed in the low lithotomy position. Foley catheter was placed. Trans-rectal ultrasound probe was inserted into the rectum and prostate seminal vesicles were visualized as well as bladder base. stepping images were performed on a 5 mm increments. Images will be placed in [facility]  treatment planning system to determine seed placement coordinates for eventual I-125 interstitial implant. Images will be reviewed with the physics and dosimetry staff for final quality approval. I personally was present for the volume study and assisted in delineation of contour volumes.  At the end of the procedure Foley catheter was removed, rectal ultrasound probe was removed. Patient tolerated his procedures extremely well with no side effects or complaints. Patient has given appointment for interstitial implant date. Consent was signed today as well as history and physical performed in preparation for his outpatient surgical implant.     Noreene Filbert, MD

## 2018-12-29 NOTE — H&P (Signed)
History and physical in anticipation of I-125 interstitial implant  Name: Clarence Skinner  MRN: [id number]  Date:   12/29/2018     DOB: 21-Nov-1960   This 58 y.o. male patient presents to the clinic for i volume study and history and physical in anticipation of I-125 interstitial implant for adenocarcinoma the prostate  REFERRING PHYSICIAN: Perrin Maltese, MD  CHIEF COMPLAINT:  Chief Complaint  Patient presents with  . Prostate Cancer    Post volume study    DIAGNOSIS: The encounter diagnosis was Malignant neoplasm of prostate ([place]).   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  Patient has been on Lupron therapy in anticipation of a volume study as well as a I-125 interstitial implant with curative intent.  He has been doing well with very little frequency urgency or nocturia.  He was counseled on surgery although has adamantly refused surgery and has opted for I-125 interstitial implant.  Seen today for volume study and history and physical anticipation of implant. PLANNED TREATMENT REGIMEN: I-125 interstitial implant  PAST MEDICAL HISTORY:  has a past medical history of COPD (chronic obstructive pulmonary disease) ([place]), GERD (gastroesophageal reflux disease), Gout, Hypertension, Multilevel degenerative disc disease, Pneumonia (11/29/2017), Sleep apnea, Tachycardia, and Wears dentures.    PAST SURGICAL HISTORY:  Past Surgical History:  Procedure Laterality Date  . ABCESS DRAINAGE  2009   tonsil  . COLONOSCOPY WITH PROPOFOL N/A 02/11/2018   Procedure: COLONOSCOPY WITH PROPOFOL;  Surgeon: Toledo, Benay Pike, MD;  Location: [facility];  Service: Endoscopy;  Laterality: N/A;  . ESOPHAGOGASTRODUODENOSCOPY (EGD) WITH PROPOFOL N/A 02/11/2018   Procedure: ESOPHAGOGASTRODUODENOSCOPY (EGD) WITH PROPOFOL;  Surgeon: Toledo, Benay Pike, MD;  Location:  [facility];  Service: Endoscopy;  Laterality: N/A;  . MASS EXCISION Right 08/14/2017   Procedure: EXCISION RIGHT TEMPORAL MASS SUBMENTAL MASS;  Surgeon: Carloyn Manner, MD;  Location: [employer];  Service: ENT;  Laterality: Right;  sleep apnea  . SEPTOPLASTY  2016   [place]    FAMILY HISTORY: family history includes Anxiety disorder in his mother; Asthma in his sister; Diabetes in his sister; Gout in his brother; Hypertension in his brother and sister; Other in his brother.  SOCIAL HISTORY:  reports that he has been smoking cigarettes. He has a 30.00 pack-year smoking history. He quit smokeless tobacco use about 34 years ago.  His smokeless tobacco use included snuff and chew. He reports current drug use. He reports that he does not drink alcohol.  ALLERGIES: Chocolate; Eggs or egg-derived products; and Milk-related compounds  MEDICATIONS:  No current facility-administered medications for this encounter.    No current outpatient medications on file.   Facility-Administered Medications Ordered in Other Encounters  Medication Dose Route Frequency Provider Last Rate Last Dose  . ciprofloxacin (CIPRO) IVPB 400 mg  400 mg Intravenous 60 min Pre-Op Billey Co, MD        ECOG PERFORMANCE STATUS:  0 - Asymptomatic  REVIEW OF SYSTEMS: Patient denies any weight loss, fatigue, weakness, fever, chills or night sweats. Patient denies any loss of vision, blurred vision. Patient denies any ringing  of the ears or hearing loss. No irregular heartbeat. Patient denies heart murmur or history of fainting. Patient denies any chest pain or pain radiating to her upper extremities. Patient denies any shortness of breath, difficulty breathing at night, cough or hemoptysis. Patient  denies any swelling in the lower legs. Patient denies any nausea vomiting, vomiting of blood, or coffee ground material in the vomitus. Patient denies any stomach pain. Patient states has had normal bowel  movements no significant constipation or diarrhea. Patient denies any dysuria, hematuria or significant nocturia. Patient denies any problems walking, swelling in the joints or loss of balance. Patient denies any skin changes, loss of hair or loss of weight. Patient denies any excessive worrying or anxiety or significant depression. Patient denies any problems with insomnia. Patient denies excessive thirst, polyuria, polydipsia. Patient denies any swollen glands, patient denies easy bruising or easy bleeding. Patient denies any recent infections, allergies or URI. Patient "s visual fields have not changed significantly in recent time.   PHYSICAL EXAM: BP 129/79   Pulse 98   Temp (!) 96 F (35.6 C) (Tympanic)   Resp 18   Wt 165 lb 7.3 oz (75 kg)   BMI 23.08 kg/m  Well-developed well-nourished patient in NAD. HEENT reveals PERLA, EOMI, discs not visualized.  Oral cavity is clear. No oral mucosal lesions are identified. Neck is clear without evidence of cervical or supraclavicular adenopathy. Lungs are clear to A&P. Cardiac examination is essentially unremarkable with regular rate and rhythm without murmur rub or thrill. Abdomen is benign with no organomegaly or masses noted. Motor sensory and DTR levels are equal and symmetric in the upper and lower extremities. Cranial nerves II through XII are grossly intact. Proprioception is intact. No peripheral adenopathy or edema is identified. No motor or sensory levels are noted. Crude visual fields are within normal range.  LABORATORY DATA: Pathology report reviewed    RADIOLOGY RESULTS: Ultrasound use for volume study today   IMPRESSION: Stage IIa adenocarcinoma the prostate  PLAN: Present time patient is doing well in anticipation of I-125 interstitial implant.  Volume study was performed today without significant side effect or complaint.  He is already been scheduled for his implant in approximately 1 month.  We went over risks and benefits of  treatment today including increased lower urinary tract symptoms diarrhea fatigue risks of general anesthesia as well as radiation safety precautions.  He  comprehends my treatment plan well.  I would like to take this opportunity to thank you for allowing me to participate in the care of your patient.Noreene Filbert, MD

## 2018-12-31 ENCOUNTER — Encounter: Payer: Self-pay | Admitting: Urology

## 2019-01-01 ENCOUNTER — Ambulatory Visit: Payer: Medicaid Other | Attending: Radiation Oncology | Admitting: Radiation Oncology

## 2019-01-01 DIAGNOSIS — Z510 Encounter for antineoplastic radiation therapy: Secondary | ICD-10-CM | POA: Insufficient documentation

## 2019-01-01 DIAGNOSIS — C61 Malignant neoplasm of prostate: Secondary | ICD-10-CM | POA: Insufficient documentation

## 2019-01-05 ENCOUNTER — Ambulatory Visit: Payer: Medicaid Other

## 2019-01-05 ENCOUNTER — Ambulatory Visit: Payer: Medicaid Other | Admitting: Radiation Oncology

## 2019-01-22 ENCOUNTER — Other Ambulatory Visit
Admission: RE | Admit: 2019-01-22 | Discharge: 2019-01-22 | Disposition: A | Discharge status: Home / Self Care | Payer: Medicaid Other | Source: Ambulatory Visit | Attending: Urology | Admitting: Urology

## 2019-01-22 ENCOUNTER — Other Ambulatory Visit: Payer: Self-pay

## 2019-01-22 DIAGNOSIS — Z01812 Encounter for preprocedural laboratory examination: Secondary | ICD-10-CM | POA: Insufficient documentation

## 2019-01-22 DIAGNOSIS — Z1159 Encounter for screening for other viral diseases: Secondary | ICD-10-CM | POA: Diagnosis not present

## 2019-01-23 ENCOUNTER — Encounter
Admission: RE | Admit: 2019-01-23 | Discharge: 2019-01-23 | Disposition: A | Discharge status: Home / Self Care | Payer: Medicaid Other | Source: Ambulatory Visit | Attending: Urology | Admitting: Urology

## 2019-01-23 ENCOUNTER — Other Ambulatory Visit: Payer: Self-pay

## 2019-01-23 LAB — SARS CORONAVIRUS 2 (TAT 6-24 HRS): SARS Coronavirus 2: NEGATIVE

## 2019-01-23 NOTE — Patient Instructions (Addendum)
Your procedure is scheduled on: Monday 01/26/19 Report to [facility]. To find out your arrival time please call [phone] between 1PM - 3PM on . (Pt aware of arrival time, 6 am)  Remember: Instructions that are not followed completely may result in serious medical risk, up to and including death, or upon the discretion of your surgeon and anesthesiologist your surgery may need to be rescheduled.     _X__ 1. Do not eat food after midnight the night before your procedure.                 No gum chewing or hard candies. You may drink clear liquids up to 2 hours                 before you are scheduled to arrive for your surgery- DO not drink clear                 liquids within 2 hours of the start of your surgery.                 Clear Liquids include:  water, apple juice without pulp, clear carbohydrate                 drink such as Clearfast or Gatorade, Black Coffee or Tea (Do not add                 anything to coffee or tea). Diabetics water only  __X__2.  On the morning of surgery brush your teeth with toothpaste and water, you                 may rinse your mouth with mouthwash if you wish.  Do not swallow any              toothpaste of mouthwash.     _X__ 3.  No Alcohol for 24 hours before or after surgery.   _X__ 4.  Do Not Smoke or use e-cigarettes For 24 Hours Prior to Your Surgery.                 Do not use any chewable tobacco products for at least 6 hours prior to                 surgery.  ____  5.  Bring all medications with you on the day of surgery if instructed.   __X__  6.  Notify your doctor if there is any change in your medical condition      (cold, fever, infections).     Do not wear jewelry, make-up, hairpins, clips or nail polish. Do not wear lotions, powders, or perfumes.  Do not shave 48 hours prior to surgery. Men may shave face and neck. Do not bring valuables to the hospital.    [facility] is not  responsible for any belongings or valuables.  Contacts, dentures/partials or body piercings may not be worn into surgery. Bring a case for your contacts, glasses or hearing aids, a denture cup will be supplied. Leave your suitcase in the car. After surgery it may be brought to your room. For patients admitted to the hospital, discharge time is determined by your treatment team.   Patients discharged the day of surgery will not be allowed to drive home.   Please read over the following fact sheets that you were given:   MRSA Information  __X__ Take these medicines the morning of  surgery with A SIP OF WATER:    1. allopurinol (ZYLOPRIM)  2. pantoprazole (PROTONIX)   3.   4.  5.  6.  ____ Fleet Enema (as directed)   ____ Use CHG Soap/SAGE wipes as directed  __X__ Use inhalers on the day of surgery  ____ Stop metformin/Janumet/Farxiga 2 days prior to surgery    ____ Take 1/2 of usual insulin dose the night before surgery. No insulin the morning          of surgery.   ____ Stop Blood Thinners Coumadin/Plavix/Xarelto/Pleta/Pradaxa/Eliquis/Effient/Aspirin  on   Or contact your Surgeon, Cardiologist or Medical Doctor regarding  ability to stop your blood thinners  __X__ Stop Anti-inflammatories 7 days before surgery such as Advil, Ibuprofen, Motrin,  BC or Goodies Powder, Naprosyn, Naproxen, Aleve, Aspirin    __X__ Stop all herbal supplements, fish oil or vitamin E until after surgery.    ____ Bring C-Pap to the hospital.     Telephone interview with patient. Instructions given, patient verbalized understanding.

## 2019-01-26 ENCOUNTER — Other Ambulatory Visit: Payer: Self-pay

## 2019-01-26 ENCOUNTER — Ambulatory Visit: Payer: Medicaid Other | Admitting: Anesthesiology

## 2019-01-26 ENCOUNTER — Ambulatory Visit
Admission: RE | Admit: 2019-01-26 | Discharge: 2019-01-26 | Disposition: A | Discharge status: Home / Self Care | Payer: Medicaid Other | Source: Ambulatory Visit | Attending: Radiation Oncology | Admitting: Radiation Oncology

## 2019-01-26 ENCOUNTER — Ambulatory Visit
Admission: RE | Admit: 2019-01-26 | Discharge: 2019-01-26 | Disposition: A | Discharge status: Home / Self Care | Payer: Medicaid Other | Attending: Urology | Admitting: Urology

## 2019-01-26 ENCOUNTER — Encounter: Payer: Self-pay | Admitting: *Deleted

## 2019-01-26 ENCOUNTER — Encounter
Admission: RE | Disposition: A | Discharge status: Home / Self Care | Payer: Self-pay | Source: Home / Self Care | Attending: Urology

## 2019-01-26 DIAGNOSIS — Z8349 Family history of other endocrine, nutritional and metabolic diseases: Secondary | ICD-10-CM | POA: Insufficient documentation

## 2019-01-26 DIAGNOSIS — Z91012 Allergy to eggs: Secondary | ICD-10-CM | POA: Diagnosis not present

## 2019-01-26 DIAGNOSIS — Z8249 Family history of ischemic heart disease and other diseases of the circulatory system: Secondary | ICD-10-CM | POA: Insufficient documentation

## 2019-01-26 DIAGNOSIS — Z91018 Allergy to other foods: Secondary | ICD-10-CM | POA: Diagnosis not present

## 2019-01-26 DIAGNOSIS — G4730 Sleep apnea, unspecified: Secondary | ICD-10-CM | POA: Diagnosis not present

## 2019-01-26 DIAGNOSIS — Z91011 Allergy to milk products: Secondary | ICD-10-CM | POA: Insufficient documentation

## 2019-01-26 DIAGNOSIS — J449 Chronic obstructive pulmonary disease, unspecified: Secondary | ICD-10-CM | POA: Diagnosis not present

## 2019-01-26 DIAGNOSIS — Z818 Family history of other mental and behavioral disorders: Secondary | ICD-10-CM | POA: Diagnosis not present

## 2019-01-26 DIAGNOSIS — M109 Gout, unspecified: Secondary | ICD-10-CM | POA: Insufficient documentation

## 2019-01-26 DIAGNOSIS — K219 Gastro-esophageal reflux disease without esophagitis: Secondary | ICD-10-CM | POA: Insufficient documentation

## 2019-01-26 DIAGNOSIS — I10 Essential (primary) hypertension: Secondary | ICD-10-CM | POA: Insufficient documentation

## 2019-01-26 DIAGNOSIS — Z833 Family history of diabetes mellitus: Secondary | ICD-10-CM | POA: Insufficient documentation

## 2019-01-26 DIAGNOSIS — F17200 Nicotine dependence, unspecified, uncomplicated: Secondary | ICD-10-CM | POA: Insufficient documentation

## 2019-01-26 DIAGNOSIS — Z825 Family history of asthma and other chronic lower respiratory diseases: Secondary | ICD-10-CM | POA: Insufficient documentation

## 2019-01-26 DIAGNOSIS — C61 Malignant neoplasm of prostate: Secondary | ICD-10-CM | POA: Diagnosis present

## 2019-01-26 HISTORY — PX: RADIOACTIVE SEED IMPLANT: SHX5150

## 2019-01-26 LAB — URINE DRUG SCREEN, QUALITATIVE (ARMC ONLY)
Amphetamines, Ur Screen: NOT DETECTED
Barbiturates, Ur Screen: NOT DETECTED
Benzodiazepine, Ur Scrn: NOT DETECTED
Cannabinoid 50 Ng, Ur ~~LOC~~: NOT DETECTED
Cocaine Metabolite,Ur ~~LOC~~: NOT DETECTED
MDMA (Ecstasy)Ur Screen: NOT DETECTED
Methadone Scn, Ur: NOT DETECTED
Opiate, Ur Screen: NOT DETECTED
Phencyclidine (PCP) Ur S: NOT DETECTED
Tricyclic, Ur Screen: NOT DETECTED

## 2019-01-26 SURGERY — INSERTION, RADIATION SOURCE, PROSTATE
Anesthesia: General

## 2019-01-26 MED ORDER — SUGAMMADEX SODIUM 200 MG/2ML IV SOLN
INTRAVENOUS | Status: DC | PRN
  Administered 2019-01-26: 200 mg via INTRAVENOUS

## 2019-01-26 MED ORDER — HYDROCODONE-ACETAMINOPHEN 5-325 MG PO TABS
1.0000 | ORAL_TABLET | ORAL | Status: DC | PRN
  Administered 2019-01-26: 1 via ORAL

## 2019-01-26 MED ORDER — PHENYLEPHRINE HCL (PRESSORS) 10 MG/ML IV SOLN
INTRAVENOUS | Status: AC
  Filled 2019-01-26: qty 1

## 2019-01-26 MED ORDER — FENTANYL CITRATE (PF) 100 MCG/2ML IJ SOLN
INTRAMUSCULAR | Status: AC
  Administered 2019-01-26: 09:00:00 25 ug via INTRAVENOUS
  Filled 2019-01-26: qty 2

## 2019-01-26 MED ORDER — ONDANSETRON HCL 4 MG/2ML IJ SOLN
INTRAMUSCULAR | Status: AC
  Filled 2019-01-26: qty 2

## 2019-01-26 MED ORDER — LACTATED RINGERS IV SOLN
INTRAVENOUS | Status: DC
  Administered 2019-01-26 (×2): via INTRAVENOUS

## 2019-01-26 MED ORDER — MIDAZOLAM HCL 2 MG/2ML IJ SOLN
INTRAMUSCULAR | Status: DC | PRN
  Administered 2019-01-26: 2 mg via INTRAVENOUS

## 2019-01-26 MED ORDER — SUCCINYLCHOLINE CHLORIDE 20 MG/ML IJ SOLN
INTRAMUSCULAR | Status: DC | PRN
  Administered 2019-01-26: 120 mg via INTRAVENOUS

## 2019-01-26 MED ORDER — PROPOFOL 10 MG/ML IV BOLUS
INTRAVENOUS | Status: AC
  Filled 2019-01-26: qty 20

## 2019-01-26 MED ORDER — LIDOCAINE HCL (CARDIAC) PF 100 MG/5ML IV SOSY
PREFILLED_SYRINGE | INTRAVENOUS | Status: DC | PRN
  Administered 2019-01-26: 100 mg via INTRAVENOUS

## 2019-01-26 MED ORDER — PROPOFOL 10 MG/ML IV BOLUS
INTRAVENOUS | Status: DC | PRN
  Administered 2019-01-26: 170 mg via INTRAVENOUS

## 2019-01-26 MED ORDER — CEFAZOLIN SODIUM-DEXTROSE 2-3 GM-%(50ML) IV SOLR
INTRAVENOUS | Status: DC | PRN
  Administered 2019-01-26: 2 g via INTRAVENOUS

## 2019-01-26 MED ORDER — LIDOCAINE HCL (PF) 1 % IJ SOLN
INTRAMUSCULAR | Status: AC
  Filled 2019-01-26: qty 5

## 2019-01-26 MED ORDER — FENTANYL CITRATE (PF) 100 MCG/2ML IJ SOLN
INTRAMUSCULAR | Status: DC | PRN
  Administered 2019-01-26 (×2): 50 ug via INTRAVENOUS
  Administered 2019-01-26: 100 ug via INTRAVENOUS

## 2019-01-26 MED ORDER — MIDAZOLAM HCL 2 MG/2ML IJ SOLN
INTRAMUSCULAR | Status: AC
  Filled 2019-01-26: qty 2

## 2019-01-26 MED ORDER — FLEET ENEMA 7-19 GM/118ML RE ENEM: 1.0000 | ENEMA | Freq: Once | RECTAL | Status: DC

## 2019-01-26 MED ORDER — HYDROCODONE-ACETAMINOPHEN 5-325 MG PO TABS: 1.0000 | ORAL_TABLET | ORAL | 0 refills | Status: AC | PRN

## 2019-01-26 MED ORDER — SUCCINYLCHOLINE CHLORIDE 20 MG/ML IJ SOLN
INTRAMUSCULAR | Status: AC
  Filled 2019-01-26: qty 1

## 2019-01-26 MED ORDER — TAMSULOSIN HCL 0.4 MG PO CAPS: 0.4000 mg | ORAL_CAPSULE | Freq: Every day | ORAL | 6 refills | Status: DC

## 2019-01-26 MED ORDER — ROCURONIUM BROMIDE 100 MG/10ML IV SOLN
INTRAVENOUS | Status: DC | PRN
  Administered 2019-01-26: 30 mg via INTRAVENOUS
  Administered 2019-01-26 (×2): 10 mg via INTRAVENOUS

## 2019-01-26 MED ORDER — ONDANSETRON HCL 4 MG/2ML IJ SOLN: 4.0000 mg | Freq: Once | INTRAMUSCULAR | Status: DC | PRN

## 2019-01-26 MED ORDER — FENTANYL CITRATE (PF) 100 MCG/2ML IJ SOLN
INTRAMUSCULAR | Status: AC
  Filled 2019-01-26: qty 2

## 2019-01-26 MED ORDER — ONDANSETRON HCL 4 MG/2ML IJ SOLN
INTRAMUSCULAR | Status: DC | PRN
  Administered 2019-01-26: 4 mg via INTRAVENOUS

## 2019-01-26 MED ORDER — FENTANYL CITRATE (PF) 100 MCG/2ML IJ SOLN
25.0000 ug | INTRAMUSCULAR | Status: AC | PRN
  Administered 2019-01-26 (×6): 25 ug via INTRAVENOUS

## 2019-01-26 MED ORDER — BACITRACIN 500 UNIT/GM EX OINT
TOPICAL_OINTMENT | CUTANEOUS | Status: DC | PRN
  Administered 2019-01-26: 1 via TOPICAL

## 2019-01-26 MED ORDER — HYDROCODONE-ACETAMINOPHEN 5-325 MG PO TABS
ORAL_TABLET | ORAL | Status: AC
  Filled 2019-01-26: qty 1

## 2019-01-26 MED ORDER — BACITRACIN ZINC 500 UNIT/GM EX OINT
TOPICAL_OINTMENT | CUTANEOUS | Status: AC
  Filled 2019-01-26: qty 28.35

## 2019-01-26 MED ORDER — ROCURONIUM BROMIDE 50 MG/5ML IV SOLN
INTRAVENOUS | Status: AC
  Filled 2019-01-26: qty 1

## 2019-01-26 MED ORDER — CIPROFLOXACIN HCL 500 MG PO TABS: 500.0000 mg | ORAL_TABLET | Freq: Two times a day (BID) | ORAL | 0 refills | Status: AC

## 2019-01-26 MED ORDER — FENTANYL CITRATE (PF) 100 MCG/2ML IJ SOLN
INTRAMUSCULAR | Status: AC
  Administered 2019-01-26: 25 ug via INTRAVENOUS
  Filled 2019-01-26: qty 2

## 2019-01-26 SURGICAL SUPPLY — 23 items
BAG URINE DRAINAGE (UROLOGICAL SUPPLIES) ×3 IMPLANT
BLADE CLIPPER SURG (BLADE) ×3 IMPLANT
CATH FOL 2WAY LX 16X5 (CATHETERS) ×3 IMPLANT
DRAPE INCISE 23X17 IOBAN STRL (DRAPES) ×2
DRAPE INCISE IOBAN 23X17 STRL (DRAPES) ×1 IMPLANT
DRAPE TABLE BACK 80X90 (DRAPES) ×3 IMPLANT
DRAPE UNDER BUTTOCK W/FLU (DRAPES) ×3 IMPLANT
DRSG TELFA 3X8 NADH (GAUZE/BANDAGES/DRESSINGS) ×3 IMPLANT
GLOVE BIO SURGEON STRL SZ 6.5 (GLOVE) ×4 IMPLANT
GLOVE BIO SURGEON STRL SZ7.5 (GLOVE) ×6 IMPLANT
GLOVE BIO SURGEONS STRL SZ 6.5 (GLOVE) ×2
GOWN STRL REUS W/ TWL LRG LVL3 (GOWN DISPOSABLE) ×2 IMPLANT
GOWN STRL REUS W/ TWL XL LVL3 (GOWN DISPOSABLE) ×1 IMPLANT
GOWN STRL REUS W/TWL LRG LVL3 (GOWN DISPOSABLE) ×4
GOWN STRL REUS W/TWL XL LVL3 (GOWN DISPOSABLE) ×2
IV NS 1000ML (IV SOLUTION) ×2
IV NS 1000ML BAXH (IV SOLUTION) ×1 IMPLANT
KIT TURNOVER CYSTO (KITS) ×3 IMPLANT
PACK CYSTO AR (MISCELLANEOUS) ×3 IMPLANT
SET CYSTO W/LG BORE CLAMP LF (SET/KITS/TRAYS/PACK) ×3 IMPLANT
SURGILUBE 2OZ TUBE FLIPTOP (MISCELLANEOUS) ×3 IMPLANT
SYR 10ML LL (SYRINGE) ×3 IMPLANT
WATER STERILE IRR 1000ML POUR (IV SOLUTION) ×3 IMPLANT

## 2019-01-26 NOTE — Op Note (Signed)
Preoperative diagnosis: Adenocarcinoma of the prostate    Postoperative diagnosis: Same    Procedure: I-125 prostate seed implantation, cystoscopy   Surgeon: Nickolas Madrid, MD   Radiation Oncologist: Noreene Filbert, M.D.    Anesthesia: General   Drains: none   Complications: none   Indications: Prostate cancer   Procedure: The patient was brought to operating suite and placement table in the supine position. At this time, a universal timeout protocol was performed, all team members were identified, Venodyne boots are placed, and he was administered IV Ancef in the preoperative period. He was placed in lithotomy position and prepped and draped in usual fashion. The radiation oncology department placed a transrectal ultrasound probe anchoring stand/ grid and aligned with previous imaging from the volume study. Foley catheter was inserted without difficulty.  All needle passage was done with real-time transrectal ultrasound guidance in both the transverse and sagittal plains in order to achieve the desired preplanned position. A total of 25 needles were placed.  74 active seeds were implanted. The Foley catheter was removed and a rigid cystoscopy failed to show any seeds outside the prostate without evidence of trauma to the urethral, prostatic fossa, or bladder.  The bladder was drained.  A fluoroscopic image was then obtained showing excellent distrubution of the brachytherapy seeds.  Each seed was counted and counts were correct.     The patient was then repositioned in the supine position, reversed from anesthesia, and taken to the PACU in stable condition.  Nickolas Madrid, MD 01/26/2019

## 2019-01-26 NOTE — OR Nursing (Signed)
Void 150cc clear amber urine.  PVR 7 ml.

## 2019-01-26 NOTE — Addendum Note (Signed)
Addendum  created 01/26/19 1012 by Rona Ravens, CRNA   Child order released for a procedure order, Clinical Note Signed, Intraprocedure Blocks edited

## 2019-01-26 NOTE — Anesthesia Postprocedure Evaluation (Signed)
Anesthesia Post Note  Patient: Animal nutritionist  Procedure(s) Performed: RADIOACTIVE SEED IMPLANT/BRACHYTHERAPY IMPLANT (N/A )  Patient location during evaluation: PACU Anesthesia Type: General Level of consciousness: awake and alert Pain management: pain level controlled Vital Signs Assessment: post-procedure vital signs reviewed and stable Respiratory status: spontaneous breathing and respiratory function stable Cardiovascular status: stable Anesthetic complications: no     Last Vitals:  Vitals:   01/26/19 0920 01/26/19 0925  BP:  137/84  Pulse: 90 90  Resp: (!) 22 20  Temp:    SpO2: 100% 91%    Last Pain:  Vitals:   01/26/19 0925  TempSrc:   PainSc: 8                  KEPHART,WILLIAM K

## 2019-01-26 NOTE — Discharge Instructions (Signed)

## 2019-01-26 NOTE — H&P (Signed)
UROLOGY H&P UPDATE  Agree with prior H&P dated 12/29/18. 58yo M with intermediate risk prostate cancer, here today for radioactive seed implantation.  Cardiac: RRR Lungs: CTA bilaterally  Laterality: N/A Procedure: Radioactive seed implant  Informed consent obtained, we specifically discussed the risks of bleeding, infection, post-operative pain, LUTS, need for additional procedures, and need for ongoing PSA monitoring.  Billey Co, MD 01/26/2019

## 2019-01-26 NOTE — Progress Notes (Signed)
Radiation Oncology I-125 interstitial implant note  Name: Clarence Skinner   Date:   12/09/2018 MRN:  [id number] DOB: 1961/04/21    This 58 y.o. 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 provider found  HPI: Patient is a 58 year old male presented with an elevated PSA of 8 status post transrectal ultrasound-guided biopsy showing Gleason 7 (3+4) adenocarcinoma the prostate.  He was taken to the OR today for I-125 interstitial implant.  Patient tolerated his procedure well..  COMPLICATIONS OF TREATMENT: none  FOLLOW UP COMPLIANCE: keeps appointments   PHYSICAL EXAM:  BP 135/69   Pulse 87   Temp (!) 97.3 F (36.3 C) (Temporal)   Resp 16   Ht 5\' 10"  (1.778 m)   Wt 175 lb (79.4 kg)   SpO2 97%   BMI 25.11 kg/m  Well-developed well-nourished patient in NAD. HEENT reveals PERLA, EOMI, discs not visualized.  Oral cavity is clear. No oral mucosal lesions are identified. Neck is clear without evidence of cervical or supraclavicular adenopathy. Lungs are clear to A&P. Cardiac examination is essentially unremarkable with regular rate and rhythm without murmur rub or thrill. Abdomen is benign with no organomegaly or masses noted. Motor sensory and DTR levels are equal and symmetric in the upper and lower extremities. Cranial nerves II through XII are grossly intact. Proprioception is intact. No peripheral adenopathy or edema is identified. No motor or sensory levels are noted. Crude visual fields are within normal range.  RADIOLOGY RESULTS: Ultrasound and plain films used for treatment planning and source placement  PLAN: Patient was taken to the operating room and general anesthesia was administered. Legs were immobilized in stirrups and patient was positioned in the exact same proportions as original volume study. Patient was prepped and Foley catheter was  placed. Ultrasound guidance identified the prostate and recreated the original set up as per treatment planning volume study. 25 needles were placed under ultrasound guidance with PVCs delivered to the prostate volume. After completion of procedure cystoscopy was performed by urology and no evidence of seeds in the bladder were noted. Patient tolerated the procedure extremely well. Initial plain film as doublecheck identified 80 seeds in the prostate. Patient has followup appointment in one month for CT scan for quality assurance will be performed.    Noreene Filbert, MD

## 2019-01-26 NOTE — Anesthesia Procedure Notes (Signed)
Procedure Name: Intubation Date/Time: 01/26/2019 8:03 AM Performed by: Rona Ravens, CRNA Pre-anesthesia Checklist: Patient identified, Emergency Drugs available, Suction available, Patient being monitored and Timeout performed Patient Re-evaluated:Patient Re-evaluated prior to induction Oxygen Delivery Method: Circle system utilized Preoxygenation: Pre-oxygenation with 100% oxygen Induction Type: Rapid sequence and IV induction Laryngoscope Size: Mac and 4 Grade View: Grade II Tube type: Oral Tube size: 7.5 mm Number of attempts: 1 Placement Confirmation: ETT inserted through vocal cords under direct vision,  positive ETCO2,  CO2 detector and breath sounds checked- equal and bilateral Secured at: 23 cm Tube secured with: Tape Dental Injury: Teeth and Oropharynx as per pre-operative assessment

## 2019-01-26 NOTE — Anesthesia Post-op Follow-up Note (Signed)
Anesthesia QCDR form completed.        

## 2019-01-26 NOTE — Anesthesia Preprocedure Evaluation (Signed)
Anesthesia Evaluation  Patient identified by MRN, date of birth, ID band Patient awake    Reviewed: Allergy & Precautions, NPO status , Patient's Chart, lab work & pertinent test results  History of Anesthesia Complications Negative for: history of anesthetic complications  Airway Mallampati: II       Dental  (+) Partial Lower, Partial Upper   Pulmonary sleep apnea (not currently using CPAP) , COPD,  COPD inhaler, Current Smoker,           Cardiovascular hypertension, Pt. on medications (-) Past MI and (-) CHF + dysrhythmias (tachycardia) (-) Valvular Problems/Murmurs     Neuro/Psych neg Seizures    GI/Hepatic Neg liver ROS, GERD  Medicated and Controlled,  Endo/Other  neg diabetes  Renal/GU negative Renal ROS     Musculoskeletal   Abdominal   Peds  Hematology   Anesthesia Other Findings   Reproductive/Obstetrics                             Anesthesia Physical Anesthesia Plan  ASA: III  Anesthesia Plan: General   Post-op Pain Management:    Induction: Intravenous  PONV Risk Score and Plan: 1 and Ondansetron  Airway Management Planned: Oral ETT  Additional Equipment:   Intra-op Plan:   Post-operative Plan:   Informed Consent: I have reviewed the patients History and Physical, chart, labs and discussed the procedure including the risks, benefits and alternatives for the proposed anesthesia with the patient or authorized representative who has indicated his/her understanding and acceptance.       Plan Discussed with:   Anesthesia Plan Comments:         Anesthesia Quick Evaluation

## 2019-01-26 NOTE — Transfer of Care (Signed)
Immediate Anesthesia Transfer of Care Note  Patient: Clarence Skinner  Procedure(s) Performed: RADIOACTIVE SEED IMPLANT/BRACHYTHERAPY IMPLANT (N/A )  Patient Location: PACU  Anesthesia Type:General  Level of Consciousness: awake, alert  and oriented  Airway & Oxygen Therapy: Patient Spontanous Breathing and Patient connected to face mask oxygen  Post-op Assessment: Report given to RN and Post -op Vital signs reviewed and stable  Post vital signs: Reviewed and stable  Last Vitals:  Vitals Value Taken Time  BP 139/86 01/26/19 0911  Temp 36.1 C 01/26/19 0911  Pulse 87 01/26/19 0914  Resp 26 01/26/19 0914  SpO2 96 % 01/26/19 0914  Vitals shown include unvalidated device data.  Last Pain:  Vitals:   01/26/19 0911  TempSrc:   PainSc: 0-No pain         Complications: No apparent anesthesia complications

## 2019-01-28 ENCOUNTER — Telehealth: Payer: Self-pay | Admitting: Urology

## 2019-01-28 NOTE — Telephone Encounter (Signed)
Returned call to patient. He just had a bowel movement after calling and leaving message. Advised patient ok to take something by mouth but do not use suppository or enema. Patient verbalized understanding.   Patient reported bumps on his tongue post surgery but states they have gone away. Will advise provider.

## 2019-01-28 NOTE — Telephone Encounter (Signed)
Pt called and states that he has not had a bowel movement since his surgery, he wants to know if he can take a laxative. Please advise.

## 2019-02-09 ENCOUNTER — Telehealth: Payer: Self-pay | Admitting: Urology

## 2019-02-09 NOTE — Telephone Encounter (Signed)
Patient notified and will contact his dentist.

## 2019-02-09 NOTE — Telephone Encounter (Signed)
Pt Clarence Skinner and states that his gums started swelling, he was asking if this was a side effect from the Seed Implant. Please advise.

## 2019-02-20 ENCOUNTER — Other Ambulatory Visit: Payer: Self-pay

## 2019-02-23 ENCOUNTER — Encounter: Payer: Self-pay | Admitting: Radiation Oncology

## 2019-02-23 ENCOUNTER — Ambulatory Visit
Admission: RE | Admit: 2019-02-23 | Discharge: 2019-02-23 | Disposition: A | Discharge status: Home / Self Care | Payer: Medicaid Other | Source: Ambulatory Visit | Attending: Radiation Oncology | Admitting: Radiation Oncology

## 2019-02-23 ENCOUNTER — Other Ambulatory Visit: Payer: Self-pay

## 2019-02-23 VITALS — BP 93/69 | HR 122 | Temp 96.9°F | Resp 20 | Wt 165.7 lb

## 2019-02-23 DIAGNOSIS — C61 Malignant neoplasm of prostate: Secondary | ICD-10-CM | POA: Diagnosis not present

## 2019-02-23 DIAGNOSIS — Z923 Personal history of irradiation: Secondary | ICD-10-CM | POA: Diagnosis not present

## 2019-02-23 DIAGNOSIS — Z510 Encounter for antineoplastic radiation therapy: Secondary | ICD-10-CM | POA: Insufficient documentation

## 2019-02-23 NOTE — Progress Notes (Signed)
Radiation Oncology Follow up Note  Name: Clarence Skinner   Date:   02/23/2019 MRN:  [id number] DOB: 04-Nov-1960    This 58 y.o. 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  REFERRING PROVIDER: Perrin Maltese, MD  HPI: Patient is a 58 year old male now at 1 month having completed I-125 interstitial implant for a Gleason 7 (3+4) adenocarcinoma the prostate presenting with a PSA of 8 seen today in routine follow-up he is doing well.  He specifically denies any increased lower urinary tract symptoms urgency frequency or diarrhea.  He had a CT scan on preliminary review shows excellent source placement..  COMPLICATIONS OF TREATMENT: none  FOLLOW UP COMPLIANCE: keeps appointments   PHYSICAL EXAM:  BP 93/69 (BP Location: Left Arm, Patient Position: Sitting)   Pulse (!) 122   Temp (!) 96.9 F (36.1 C) (Tympanic)   Resp 20   Wt 165 lb 11.2 oz (75.2 kg)   BMI 23.78 kg/m  Well-developed well-nourished patient in NAD. HEENT reveals PERLA, EOMI, discs not visualized.  Oral cavity is clear. No oral mucosal lesions are identified. Neck is clear without evidence of cervical or supraclavicular adenopathy. Lungs are clear to A&P. Cardiac examination is essentially unremarkable with regular rate and rhythm without murmur rub or thrill. Abdomen is benign with no organomegaly or masses noted. Motor sensory and DTR levels are equal and symmetric in the upper and lower extremities. Cranial nerves II through XII are grossly intact. Proprioception is intact. No peripheral adenopathy or edema is identified. No motor or sensory levels are noted. Crude visual fields are within normal range.  RADIOLOGY RESULTS: CT scan for QA of seed placement reviewed  PLAN: Present time patient is doing well very low side effect profile from his I-125 interstitial implant.  I have asked to see him  back in 3 months and we will check a PSA prior to that visit.  Patient comprehends my treatment plan well.  I would like to take this opportunity to thank you for allowing me to participate in the care of your patient.Noreene Filbert, MD

## 2019-03-09 DIAGNOSIS — C61 Malignant neoplasm of prostate: Secondary | ICD-10-CM | POA: Diagnosis not present

## 2019-03-09 DIAGNOSIS — Z510 Encounter for antineoplastic radiation therapy: Secondary | ICD-10-CM | POA: Diagnosis not present

## 2019-03-10 DIAGNOSIS — Z510 Encounter for antineoplastic radiation therapy: Secondary | ICD-10-CM | POA: Diagnosis not present

## 2019-03-31 DIAGNOSIS — C61 Malignant neoplasm of prostate: Secondary | ICD-10-CM | POA: Insufficient documentation

## 2019-05-06 IMAGING — CT CT CHEST W/ CM
2 of 4 series · 15 of 36 positions shown, 18 images · IV contrast (iopamidol)
Comparison: 11/29/2017

CLINICAL DATA: Followup pulmonary lesions seen on recent chest CT.

EXAM:
CT CHEST WITH CONTRAST
TECHNIQUE: Multidetector CT imaging of the chest was performed during
intravenous contrast administration.
CONTRAST:  75mL [id number] IOPAMIDOL (EEWH70-9TT) INJECTION 61%

[Series 2: axial chest · axial · 0.64mm/px · z∈[-1253,-945]mm · 12 of 182 slices shown, 15 images]
[im 14/182  mediastinal]
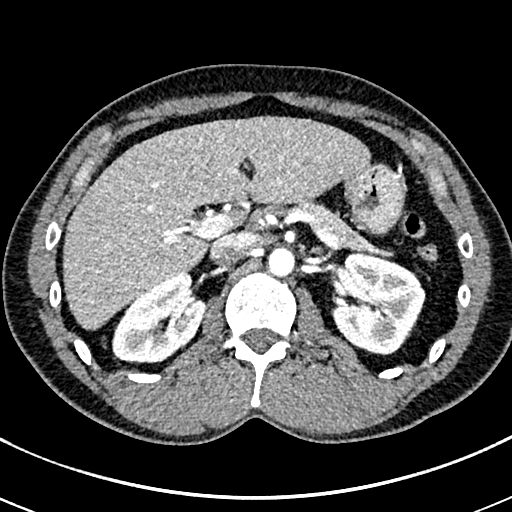
[im 14/182  lung]
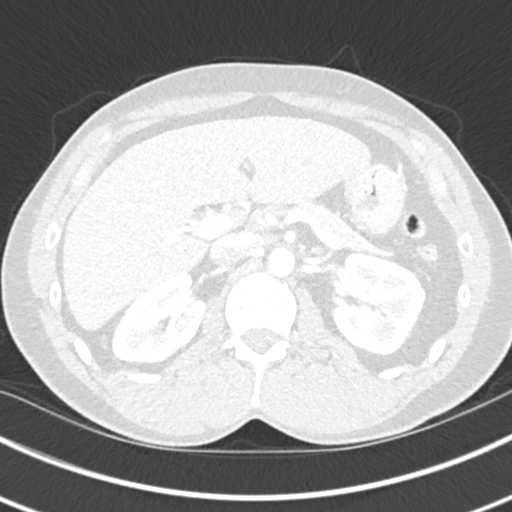
[im 28/182  lung]
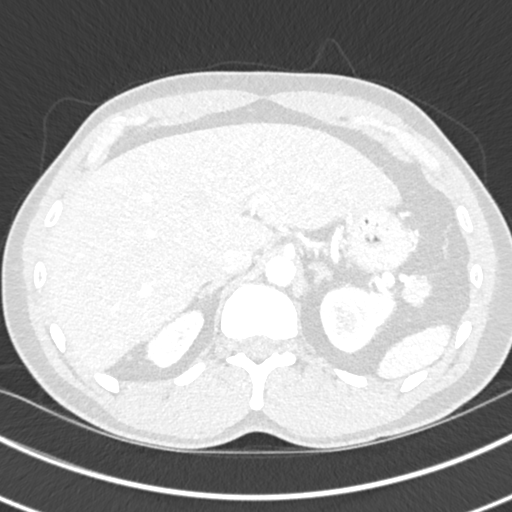
[im 42/182  lung]
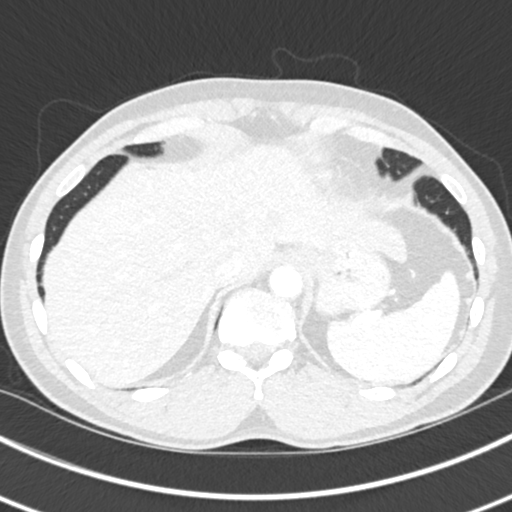
[im 56/182  lung]
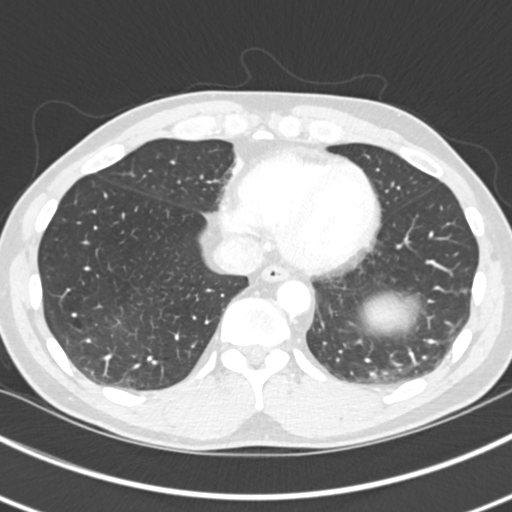
[im 70/182  mediastinal]
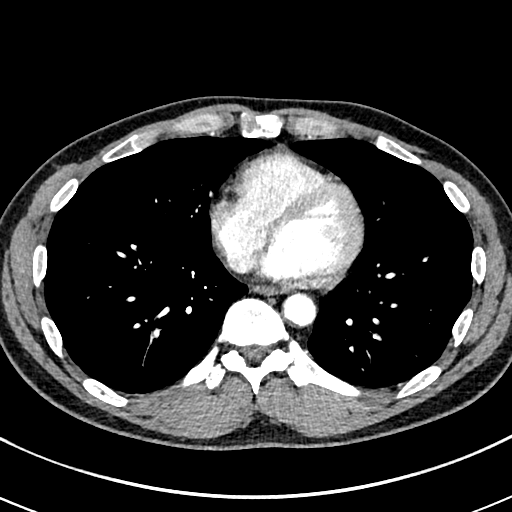
[im 70/182  lung]
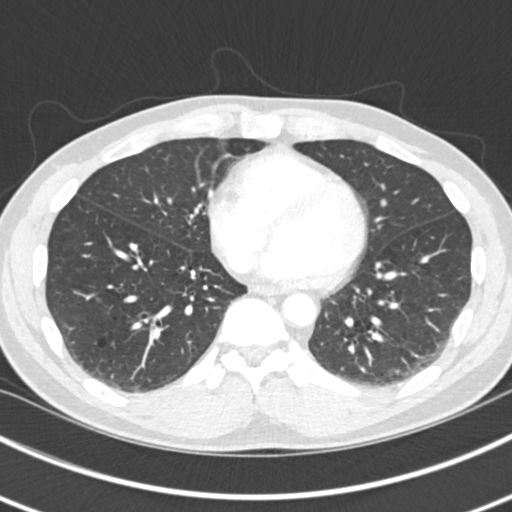
[im 84/182  lung]
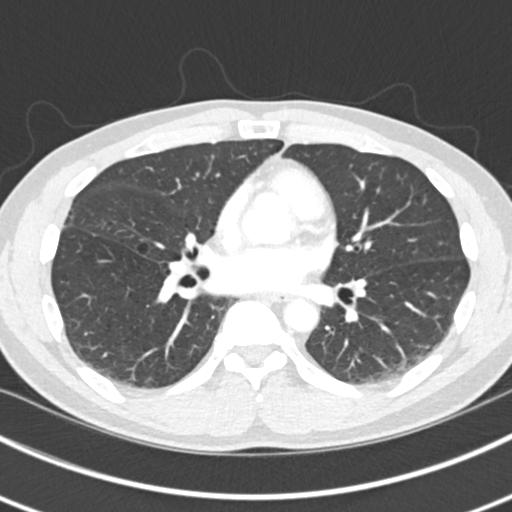
[im 98/182  lung]
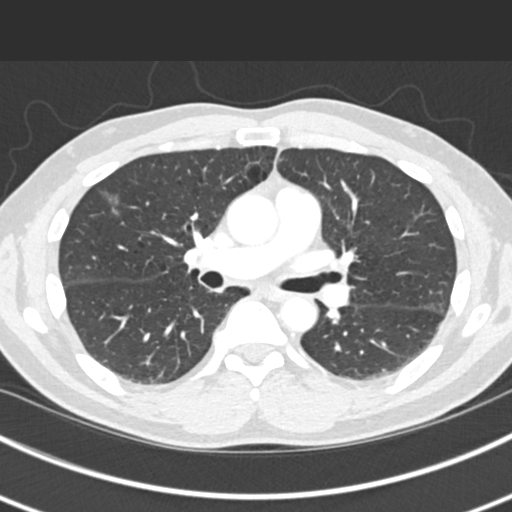
[im 112/182  lung]
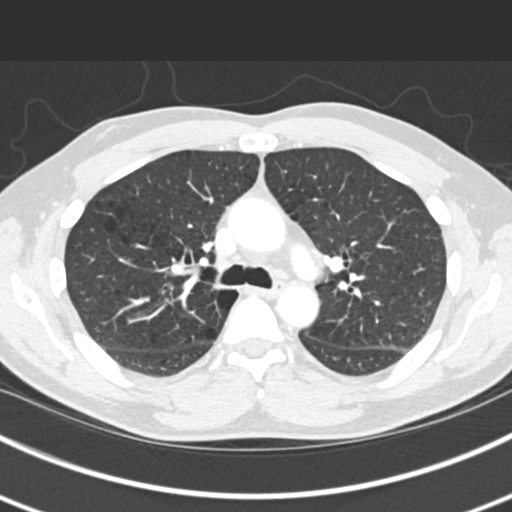
[im 126/182  mediastinal]
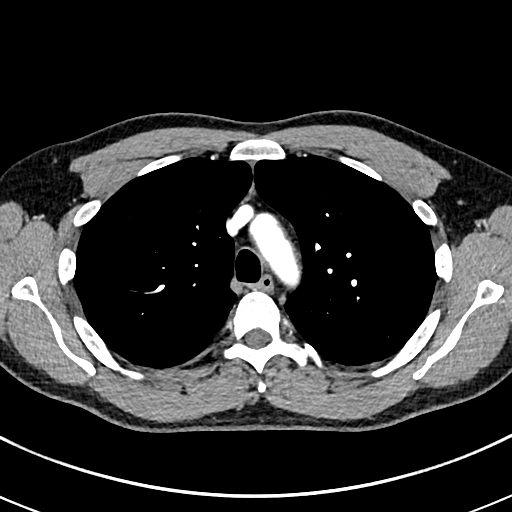
[im 126/182  lung]
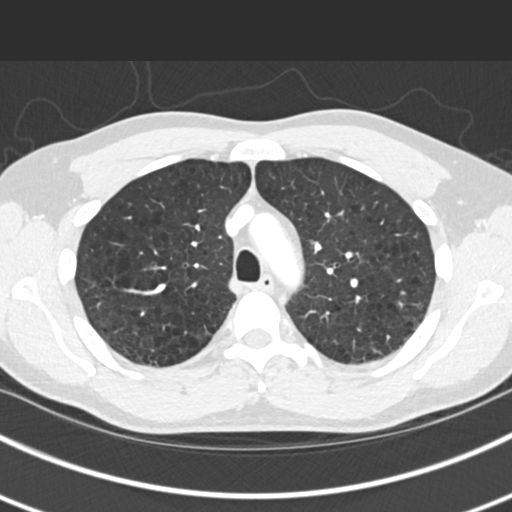
[im 140/182  lung]
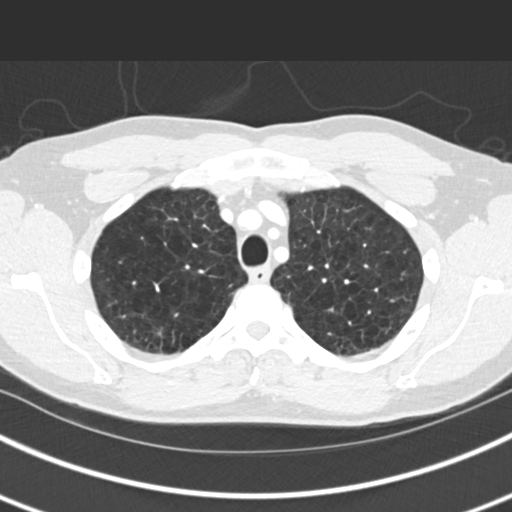
[im 154/182  lung]
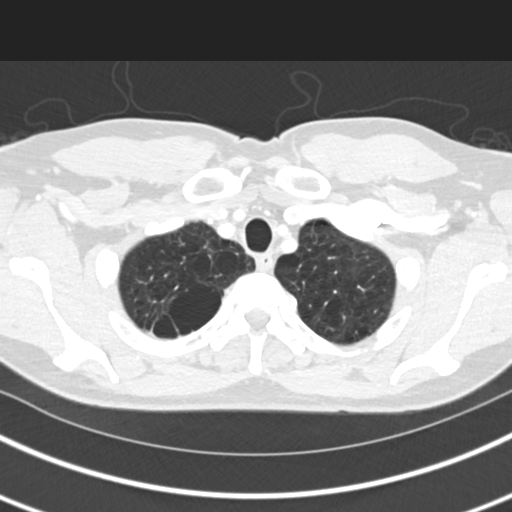
[im 168/182  lung]
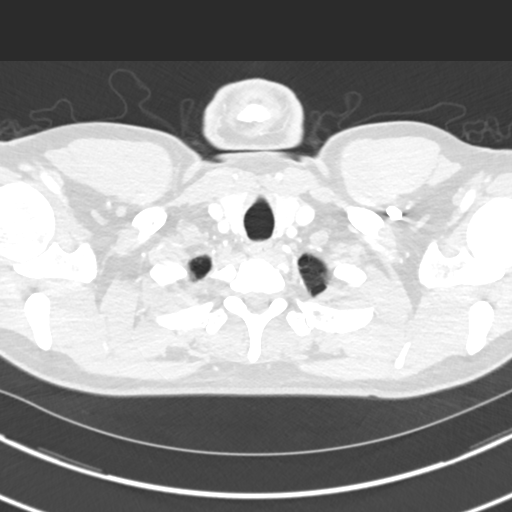

[Series 4: coronal chest · coronal · 0.68mm/px · 3 of 126 slices shown]
[im 26/126  lung]
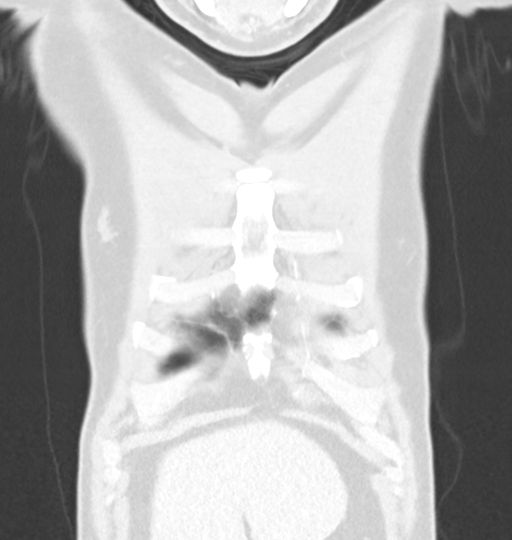
[im 51/126  lung]
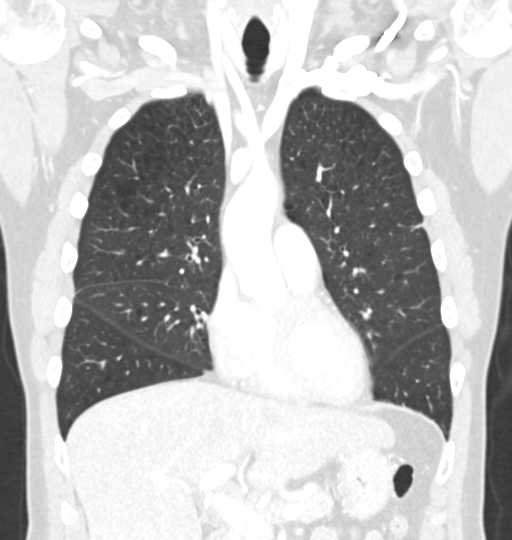
[im 76/126  lung]
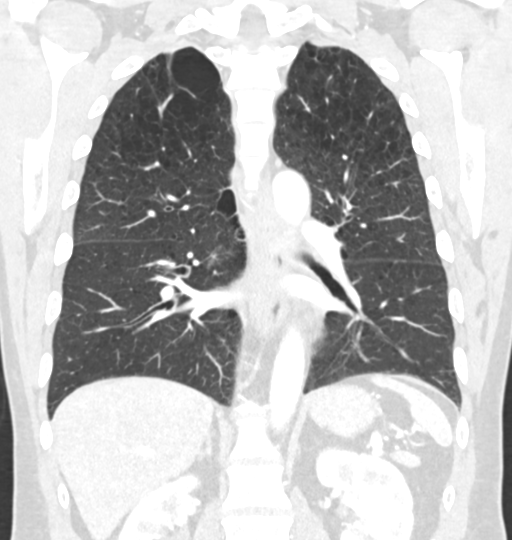

[15 of 36 positions shown; findings below may reference images not displayed]

FINDINGS: Cardiovascular: The heart is normal in size. No pericardial
effusion. Stable mild tortuosity of the thoracic aorta. No
significant atherosclerotic calcifications and no dissection. The
branch vessels are patent. Scattered calcifications.

Mediastinum/Nodes: Stable small scattered mediastinal and hilar
lymph nodes. No significant interval change. The esophagus is
grossly normal.

Lungs/Pleura: The 8 mm right upper lobe pulmonary nodule has
resolved. The left upper lobe bilobed nodular density has improved.
Band of left lower lobe atelectasis has resolved. No acute
infiltrates or pulmonary edema. Stable advanced emphysematous
changes and pulmonary scarring.

Upper Abdomen: No significant upper abdominal findings.

Musculoskeletal: No significant findings.
IMPRESSION: 1. Subsequent improved CT appearance of the chest. The 8 mm right
upper lobe pulmonary nodule has resolved and the left upper lobe
bilobed nodular density has improved. It still has some worrisome CT
features but it is improvement suggests an inflammatory or
infectious process. Given the patient's underlying emphysema and
smoking history I would recommend a followup noncontrast chest CT in
6-8 weeks to reassess this.
2. Stable scattered mediastinal and hilar lymph nodes. No new or
progressive findings.

Aortic Atherosclerosis (JARDF-1QA.A) and Emphysema (JARDF-Y6E.R).

## 2019-06-04 ENCOUNTER — Other Ambulatory Visit: Payer: Self-pay

## 2019-06-04 DIAGNOSIS — C61 Malignant neoplasm of prostate: Secondary | ICD-10-CM

## 2019-06-05 ENCOUNTER — Other Ambulatory Visit: Payer: Medicaid Other

## 2019-06-05 ENCOUNTER — Other Ambulatory Visit: Payer: Self-pay

## 2019-06-05 DIAGNOSIS — C61 Malignant neoplasm of prostate: Secondary | ICD-10-CM

## 2019-06-06 LAB — PSA: Prostate Specific Ag, Serum: <0.1 ng/mL (ref 0.0–4.0)

## 2019-06-08 ENCOUNTER — Telehealth: Payer: Self-pay

## 2019-06-08 NOTE — Telephone Encounter (Signed)
-----   Message from Billey Co, MD sent at 06/08/2019  8:18 AM EST ----- Doristine Devoid news, PSA 0 after undergoing radiation for prostate cancer. Keep follow up as scheduled  Nickolas Madrid, MD 06/08/2019

## 2019-06-08 NOTE — Telephone Encounter (Signed)
Patient notified

## 2019-06-10 ENCOUNTER — Ambulatory Visit: Payer: Medicaid Other | Admitting: Urology

## 2019-07-01 ENCOUNTER — Encounter: Payer: Self-pay | Admitting: Urology

## 2019-07-01 ENCOUNTER — Ambulatory Visit: Payer: Medicaid Other | Admitting: Urology

## 2019-07-01 ENCOUNTER — Other Ambulatory Visit: Payer: Self-pay

## 2019-07-01 VITALS — BP 131/81 | HR 99 | Ht 70.0 in | Wt 165.0 lb

## 2019-07-01 DIAGNOSIS — C61 Malignant neoplasm of prostate: Secondary | ICD-10-CM | POA: Diagnosis not present

## 2019-07-01 MED ORDER — TADALAFIL 20 MG PO TABS: 20.0000 mg | ORAL_TABLET | Freq: Every day | ORAL | 11 refills | Status: DC | PRN

## 2019-07-01 MED ORDER — TADALAFIL 20 MG PO TABS: 20.0000 mg | ORAL_TABLET | Freq: Every day | ORAL | 0 refills | Status: DC | PRN

## 2019-07-01 NOTE — Progress Notes (Signed)
   07/01/2019 9:59 AM   Clarence Skinner 1961-05-26 [id number]  Reason for visit: Follow up prostate cancer  HPI: I saw Clarence Skinner for follow-up of prostate cancer.  He is a 58 year old African-American male who was diagnosed with favorable intermediate risk prostate cancer in January 2020 for an elevated PSA of 8.0.  His brachytherapy was slightly delayed secondary to the Covid pandemic, but he ultimately underwent brachytherapy seed implant with myself and Dr. Donella Stade on 01/26/2019.  PSA on 06/05/2019 was undetectable.  He reports he is doing well since his procedure.  He reports he is having some hot flashes.  He never received ADT, so these are of unclear etiology.  He has persistent erectile dysfunction, and has not had medication for this recently.  He was on sildenafil pre-op with only slight improvement in his erections.  He denies any urinary symptoms of weak stream, urgency, frequency, or incontinence.  We had a long conversation about the need for ongoing PSA surveillance at least 10 years after treatment for his favorable intermediate risk prostate cancer.  I am not sure the etiology of his hot flashes, as he did not receive ADT, and I have not seen this after brachytherapy alone.  RTC 6 months with PSA prior Trial of Cialis 20 mg on demand for ED  A total of 25 minutes were spent face-to-face with the patient, greater than 50% was spent in patient education, counseling, and coordination of care regarding prostate cancer, urinary symptoms, ED, and need for long-term surveillance.  Billey Co, [facility] [address], [place], [place] [address] [phone]

## 2019-07-03 ENCOUNTER — Ambulatory Visit: Payer: Medicaid Other | Attending: Radiation Oncology | Admitting: Radiation Oncology

## 2019-09-30 ENCOUNTER — Telehealth: Payer: Self-pay

## 2019-09-30 NOTE — Telephone Encounter (Signed)
Patient called stating that the Cialis 20mg  is not working, he states he has tried even taking daily and it is "not getting him to were he wants to be". Patient was informed that Cialis is 36hours and should not take daily but 1-2 hours prior to intercourse. Patient wants another drug to try since Tadaladil and Sidinafil is not working.

## 2019-11-05 ENCOUNTER — Other Ambulatory Visit: Payer: Self-pay

## 2019-11-05 ENCOUNTER — Ambulatory Visit (INDEPENDENT_AMBULATORY_CARE_PROVIDER_SITE_OTHER): Payer: Medicaid Other | Admitting: Urology

## 2019-11-05 VITALS — BP 112/69 | HR 106 | Ht 71.0 in | Wt 179.0 lb

## 2019-11-05 DIAGNOSIS — C61 Malignant neoplasm of prostate: Secondary | ICD-10-CM

## 2019-11-05 DIAGNOSIS — N529 Male erectile dysfunction, unspecified: Secondary | ICD-10-CM

## 2019-11-05 MED ORDER — TADALAFIL 20 MG PO TABS: 20.0000 mg | ORAL_TABLET | ORAL | 6 refills | Status: DC

## 2019-11-05 NOTE — Patient Instructions (Signed)
Erectile Dysfunction Erectile dysfunction (ED) is the inability to get or keep an erection in order to have sexual intercourse. Erectile dysfunction may include:  Inability to get an erection.  Lack of enough hardness of the erection to allow penetration.  Loss of the erection before sex is finished. What are the causes? This condition may be caused by:  Certain medicines, such as: ? Pain relievers. ? Antihistamines. ? Antidepressants. ? Blood pressure medicines. ? Water pills (diuretics). ? Ulcer medicines. ? Muscle relaxants. ? Drugs.  Excessive drinking.  Psychological causes, such as: ? Anxiety. ? Depression. ? Sadness. ? Exhaustion. ? Performance fear. ? Stress.  Physical causes, such as: ? Artery problems. This may include diabetes, smoking, liver disease, or atherosclerosis. ? High blood pressure. ? Hormonal problems, such as low testosterone. ? Obesity. ? Nerve problems. This may include back or pelvic injuries, diabetes mellitus, multiple sclerosis, or Parkinson disease. What are the signs or symptoms? Symptoms of this condition include:  Inability to get an erection.  Lack of enough hardness of the erection to allow penetration.  Loss of the erection before sex is finished.  Normal erections at some times, but with frequent unsatisfactory episodes.  Low sexual satisfaction in either partner due to erection problems.  A curved penis occurring with erection. The curve may cause pain or the penis may be too curved to allow for intercourse.  Never having nighttime erections. How is this diagnosed? This condition is often diagnosed by:  Performing a physical exam to find other diseases or specific problems with the penis.  Asking you detailed questions about the problem.  Performing blood tests to check for diabetes mellitus or to measure hormone levels.  Performing other tests to check for underlying health conditions.  Performing an ultrasound  exam to check for scarring.  Performing a test to check blood flow to the penis.  Doing a sleep study at home to measure nighttime erections. How is this treated? This condition may be treated by:  Medicine taken by mouth to help you achieve an erection (oral medicine).  Hormone replacement therapy to replace low testosterone levels.  Medicine that is injected into the penis. Your health care provider may instruct you how to give yourself these injections at home.  Vacuum pump. This is a pump with a ring on it. The pump and ring are placed on the penis and used to create pressure that helps the penis become erect.  Penile implant surgery. In this procedure, you may receive: ? An inflatable implant. This consists of cylinders, a pump, and a reservoir. The cylinders can be inflated with a fluid that helps to create an erection, and they can be deflated after intercourse. ? A semi-rigid implant. This consists of two silicone rubber rods. The rods provide some rigidity. They are also flexible, so the penis can both curve downward in its normal position and become straight for sexual intercourse.  Blood vessel surgery, to improve blood flow to the penis. During this procedure, a blood vessel from a different part of the body is placed into the penis to allow blood to flow around (bypass) damaged or blocked blood vessels.  Lifestyle changes, such as exercising more, losing weight, and quitting smoking. Follow these instructions at home: Medicines   Take over-the-counter and prescription medicines only as told by your health care provider. Do not increase the dosage without first discussing it with your health care provider.  If you are using self-injections, perform injections as directed by your   health care provider. Make sure to avoid any veins that are on the surface of the penis. After giving an injection, apply pressure to the injection site for 5 minutes. General  instructions  Exercise regularly, as directed by your health care provider. Work with your health care provider to lose weight, if needed.  Do not use any products that contain nicotine or tobacco, such as cigarettes and e-cigarettes. If you need help quitting, ask your health care provider.  Before using a vacuum pump, read the instructions that come with the pump and discuss any questions with your health care provider.  Keep all follow-up visits as told by your health care provider. This is important. Contact a health care provider if:  You feel nauseous.  You vomit. Get help right away if:  You are taking oral or injectable medicines and you have an erection that lasts longer than 4 hours. If your health care provider is unavailable, go to the nearest emergency room for evaluation. An erection that lasts much longer than 4 hours can result in permanent damage to your penis.  You have severe pain in your groin or abdomen.  You develop redness or severe swelling of your penis.  You have redness spreading up into your groin or lower abdomen.  You are unable to urinate.  You experience chest pain or a rapid heart beat (palpitations) after taking oral medicines. Summary  Erectile dysfunction (ED) is the inability to get or keep an erection during sexual intercourse. This problem can usually be treated successfully.  This condition is diagnosed based on a physical exam, your symptoms, and tests to determine the cause. Treatment varies depending on the cause, and may include medicines, hormone therapy, surgery, or vacuum pump.  You may need follow-up visits to make sure that you are using your medicines or devices correctly.  Get help right away if you are taking or injecting medicines and you have an erection that lasts longer than 4 hours. This information is not intended to replace advice given to you by your health care provider. Make sure you discuss any questions you have with  your health care provider. Document Revised: 06/21/2017 Document Reviewed: 07/25/2016 Elsevier Patient Education  2020 Elsevier Inc.  Tadalafil tablets (Cialis) What is this medicine? TADALAFIL (tah DA la fil) is used to treat erection problems in men. It is also used for enlargement of the prostate gland in men, a condition called benign prostatic hyperplasia or BPH. This medicine improves urine flow and reduces BPH symptoms. This medicine can also treat both erection problems and BPH when they occur together. This medicine may be used for other purposes; ask your health care provider or pharmacist if you have questions. COMMON BRAND NAME(S): Adcirca, ALYQ, Cialis What should I tell my health care provider before I take this medicine? They need to know if you have any of these conditions: bleeding disorders eye or vision problems, including a rare inherited eye disease called retinitis pigmentosa anatomical deformation of the penis, Peyronie's disease, or history of priapism (painful and prolonged erection) heart disease, angina, a history of heart attack, irregular heart beats, or other heart problems high or low blood pressure history of blood diseases, like sickle cell anemia or leukemia history of stomach bleeding kidney disease liver disease stroke an unusual or allergic reaction to tadalafil, other medicines, foods, dyes, or preservatives pregnant or trying to get pregnant breast-feeding How should I use this medicine? Take this medicine by mouth with a glass of water.   Follow the directions on the prescription label. You may take this medicine with or without meals. When this medicine is used for erection problems, your doctor may prescribe it to be taken once daily or as needed. If you are taking the medicine as needed, you may be able to have sexual activity 30 minutes after taking it and for up to 36 hours after taking it. Whether you are taking the medicine as needed or once  daily, you should not take more than one dose per day. If you are taking this medicine for symptoms of benign prostatic hyperplasia (BPH) or to treat both BPH and an erection problem, take the dose once daily at about the same time each day. Do not take your medicine more often than directed. Talk to your pediatrician regarding the use of this medicine in children. Special care may be needed. Overdosage: If you think you have taken too much of this medicine contact a poison control center or emergency room at once. NOTE: This medicine is only for you. Do not share this medicine with others. What if I miss a dose? If you are taking this medicine as needed for erection problems, this does not apply. If you miss a dose while taking this medicine once daily for an erection problem, benign prostatic hyperplasia, or both, take it as soon as you remember, but do not take more than one dose per day. What may interact with this medicine? Do not take this medicine with any of the following medications: nitrates like amyl nitrite, isosorbide dinitrate, isosorbide mononitrate, nitroglycerin other medicines for erectile dysfunction like avanafil, sildenafil, vardenafil other tadalafil products (Adcirca) riociguat This medicine may also interact with the following medications: certain drugs for high blood pressure certain drugs for the treatment of HIV infection or AIDS certain drugs used for fungal or yeast infections, like fluconazole, itraconazole, ketoconazole, and voriconazole certain drugs used for seizures like carbamazepine, phenytoin, and phenobarbital grapefruit juice macrolide antibiotics like clarithromycin, erythromycin, troleandomycin medicines for prostate problems rifabutin, rifampin or rifapentine This list may not describe all possible interactions. Give your health care provider a list of all the medicines, herbs, non-prescription drugs, or dietary supplements you use. Also tell them if you  smoke, drink alcohol, or use illegal drugs. Some items may interact with your medicine. What should I watch for while using this medicine? If you notice any changes in your vision while taking this drug, call your doctor or health care professional as soon as possible. Stop using this medicine and call your health care provider right away if you have a loss of sight in one or both eyes. Contact your doctor or health care professional right away if the erection lasts longer than 4 hours or if it becomes painful. This may be a sign of serious problem and must be treated right away to prevent permanent damage. If you experience symptoms of nausea, dizziness, chest pain or arm pain upon initiation of sexual activity after taking this medicine, you should refrain from further activity and call your doctor or health care professional as soon as possible. Do not drink alcohol to excess (examples, 5 glasses of wine or 5 shots of whiskey) when taking this medicine. When taken in excess, alcohol can increase your chances of getting a headache or getting dizzy, increasing your heart rate or lowering your blood pressure. Using this medicine does not protect you or your partner against HIV infection (the virus that causes AIDS) or other sexually transmitted diseases. What side   effects may I notice from receiving this medicine? Side effects that you should report to your doctor or health care professional as soon as possible: allergic reactions like skin rash, itching or hives, swelling of the face, lips, or tongue breathing problems changes in hearing changes in vision chest pain fast, irregular heartbeat prolonged or painful erection seizures Side effects that usually do not require medical attention (report to your doctor or health care professional if they continue or are bothersome): back pain dizziness flushing headache indigestion muscle aches nausea stuffy or runny nose This list may not describe  all possible side effects. Call your doctor for medical advice about side effects. You may report side effects to FDA at 1-800-FDA-1088. Where should I keep my medicine? Keep out of the reach of children. Store at room temperature between 15 and 30 degrees C (59 and 86 degrees F). Throw away any unused medicine after the expiration date. NOTE: This sheet is a summary. It may not cover all possible information. If you have questions about this medicine, talk to your doctor, pharmacist, or health care provider.  2020 Elsevier/Gold Standard (2013-11-27 13:15:49)  

## 2019-11-05 NOTE — Progress Notes (Signed)
   11/05/2019 4:46 PM   Clarence Skinner 17-Jan-1961 [id number]  Reason for visit: Follow up prostate cancer, ED  HPI: I saw [name] for follow-up of prostate cancer and erectile dysfunction.  He is a 59 year old African-American male who was diagnosed with favorable intermediate risk prostate cancer in January 2020 for an elevated PSA of 8.0.  His brachytherapy was slightly delayed secondary to the Covid pandemic, but he ultimately underwent brachytherapy seed implant with myself and Dr. Donella Stade on 01/26/2019.  PSA on 06/05/2019 was undetectable.  He was scheduled with me originally in June 2021 for next PSA check, however he moved up his appointment to further discuss his erectile dysfunction.  He was having ED before being diagnosed with prostate cancer and undergoing brachytherapy that was refractory to sildenafil.  At our last visit I had recommended a trial of Cialis 20 mg either on demand or every other day.  He has been taking it on demand intermittently, but he thinks he has been taking 10 mg instead of 20.  He does notice some difference in terms of the duration of his erections, but he states he is still unable to maintain the erection as long as he would like.  We had another very long conversation about erectile dysfunction, especially in the setting of brachytherapy.  We again reviewed the treatment algorithm for erectile dysfunction including PDE 5 inhibitors, vacuum erection device, penile injections, or penile prosthesis.  He he would like to trial the 20 mg dose of Cialis every other day to see if this improves his erections.  He is unsure if he would be interested in penile injections in the future.  He would like to have his PSA drawn today so that he does not to come back in 2 months for his scheduled visit which I think is reasonable.  PSA today for prostate cancer surveillance, call with results Trial of Cialis 20 mg every other day for ED RTC 6 months for PSA and symptom  check  Billey Co, MD  [facility] [address], [place], [place] [address] [phone]

## 2019-11-06 ENCOUNTER — Telehealth: Payer: Self-pay

## 2019-11-06 LAB — PSA: Prostate Specific Ag, Serum: 0.3 ng/mL (ref 0.0–4.0)

## 2019-11-06 NOTE — Telephone Encounter (Signed)
-----   Message from Billey Co, MD sent at 11/06/2019  9:26 AM EDT ----- PSA remains very low at 0.3, will continue to follow PSA closely. Keep follow up with Dr. Baruch Gouty soon and  then with me in October  Nickolas Madrid, MD 11/06/2019

## 2019-11-06 NOTE — Telephone Encounter (Signed)
Called pt informed him of the information below. Pt gave verbal understanding. Pt questions if he needs to see Dr. Baruch Gouty as he has not seen him in sometime since his surgery and he would prefer to only see you. He has no appointments schedule with Dr. Baruch Gouty at this time. Please advise.

## 2019-11-07 NOTE — Telephone Encounter (Signed)
That's fine, keep October follow up with me with PSA prior, thanks  Nickolas Madrid, MD 11/07/2019

## 2019-11-09 NOTE — Telephone Encounter (Signed)
Called pt informed him of the information below. Pt gave verbal understanding.  

## 2019-12-17 ENCOUNTER — Other Ambulatory Visit: Payer: Medicaid Other

## 2019-12-24 ENCOUNTER — Ambulatory Visit: Payer: Medicaid Other | Admitting: Urology

## 2020-04-25 ENCOUNTER — Other Ambulatory Visit: Payer: Medicaid Other

## 2020-04-25 ENCOUNTER — Other Ambulatory Visit: Payer: Self-pay

## 2020-04-25 DIAGNOSIS — C61 Malignant neoplasm of prostate: Secondary | ICD-10-CM

## 2020-04-26 LAB — PSA: Prostate Specific Ag, Serum: 0.6 ng/mL (ref 0.0–4.0)

## 2020-05-11 ENCOUNTER — Encounter: Payer: Self-pay | Admitting: Urology

## 2020-05-11 ENCOUNTER — Ambulatory Visit (INDEPENDENT_AMBULATORY_CARE_PROVIDER_SITE_OTHER): Payer: Medicaid Other | Admitting: Urology

## 2020-05-11 ENCOUNTER — Other Ambulatory Visit: Payer: Self-pay

## 2020-05-11 VITALS — BP 120/79 | HR 106 | Ht 71.0 in | Wt 167.0 lb

## 2020-05-11 DIAGNOSIS — C61 Malignant neoplasm of prostate: Secondary | ICD-10-CM

## 2020-05-11 DIAGNOSIS — N529 Male erectile dysfunction, unspecified: Secondary | ICD-10-CM

## 2020-05-11 MED ORDER — TADALAFIL 20 MG PO TABS: 20.0000 mg | ORAL_TABLET | Freq: Every day | ORAL | 11 refills | Status: DC

## 2020-05-11 NOTE — Progress Notes (Signed)
   05/11/2020 10:59 AM   Clarence Skinner 06/26/1961 [id number]  Reason for visit: Follow up prostate cancer, ED, urinary symptoms  HPI: I saw [name] in urology clinic for the above issues.  He is a 59 year old African-American male who was diagnosed with favorable intermediate risk prostate cancer in January 2020 for an elevated PSA of 8.  His brachytherapy was slightly delayed secondary to the Covid pandemic, but he ultimately underwent brachytherapy seed implant with myself and Dr. Baruch Gouty on 01/26/2019.  PSA on 06/05/2019 was undetectable.  PSA in April 2021 was 0.3, and earlier this month was 0.6.  We discussed the need for ongoing close monitoring of the PSA moving forward, and the concept of a PSA bounce after radiation.  He denies any bone pain or weight loss.  He denies any significant urinary symptoms currently aside from occasional urgency if he holds it too long.  He is no longer taking Flomax.  He denies any significant nocturia unless he drinks multiple beers before bed.  He also continues to smoke.  He has had erectile dysfunction even before brachytherapy, and currently is taking Cialis 20 mg every other day.  He is able to achieve erection sufficient for intercourse, but he feels he loses his erection sooner than he would like.  I recommended taking the Cialis 20 mg daily moving forward to see if this helps improve his duration of erections.  We also reviewed penile injections, and he is not interested in trying this in the future.  Finally, we reviewed the relationship between smoking and erectile dysfunction.  Cialis 20 mg daily for ED Continue close PSA monitoring, RTC 6 months with PSA prior   Billey Co, MD  [facility] [address] [place], [place] [address] [phone]

## 2020-06-17 DIAGNOSIS — J449 Chronic obstructive pulmonary disease, unspecified: Secondary | ICD-10-CM | POA: Diagnosis present

## 2020-11-07 ENCOUNTER — Other Ambulatory Visit: Payer: Medicaid Other

## 2020-11-07 ENCOUNTER — Other Ambulatory Visit: Payer: Self-pay

## 2020-11-07 DIAGNOSIS — C61 Malignant neoplasm of prostate: Secondary | ICD-10-CM

## 2020-11-08 LAB — PSA: Prostate Specific Ag, Serum: 0.5 ng/mL (ref 0.0–4.0)

## 2020-11-10 ENCOUNTER — Ambulatory Visit: Payer: Self-pay | Admitting: Urology

## 2020-12-06 DIAGNOSIS — R799 Abnormal finding of blood chemistry, unspecified: Secondary | ICD-10-CM | POA: Diagnosis not present

## 2020-12-06 DIAGNOSIS — E785 Hyperlipidemia, unspecified: Secondary | ICD-10-CM | POA: Diagnosis not present

## 2020-12-06 DIAGNOSIS — I10 Essential (primary) hypertension: Secondary | ICD-10-CM | POA: Diagnosis not present

## 2020-12-06 DIAGNOSIS — J449 Chronic obstructive pulmonary disease, unspecified: Secondary | ICD-10-CM | POA: Diagnosis not present

## 2020-12-06 DIAGNOSIS — E559 Vitamin D deficiency, unspecified: Secondary | ICD-10-CM | POA: Diagnosis not present

## 2020-12-06 DIAGNOSIS — E039 Hypothyroidism, unspecified: Secondary | ICD-10-CM | POA: Diagnosis not present

## 2020-12-08 ENCOUNTER — Encounter: Payer: Self-pay | Admitting: Urology

## 2020-12-08 ENCOUNTER — Other Ambulatory Visit: Payer: Self-pay

## 2020-12-08 ENCOUNTER — Ambulatory Visit (INDEPENDENT_AMBULATORY_CARE_PROVIDER_SITE_OTHER): Payer: Medicaid Other | Admitting: Urology

## 2020-12-08 VITALS — BP 117/66 | HR 88 | Ht 71.0 in | Wt 168.0 lb

## 2020-12-08 DIAGNOSIS — N529 Male erectile dysfunction, unspecified: Secondary | ICD-10-CM

## 2020-12-08 DIAGNOSIS — C61 Malignant neoplasm of prostate: Secondary | ICD-10-CM

## 2020-12-08 MED ORDER — TADALAFIL 20 MG PO TABS: 20.0000 mg | ORAL_TABLET | Freq: Every day | ORAL | 11 refills | Status: DC

## 2020-12-08 NOTE — Progress Notes (Signed)
   12/08/2020 10:19 AM   [name] November 08, 1960 [id number]  Reason for visit: Follow up prostate cancer, ED, urinary symptoms  HPI: I saw Mr. Clarence Skinner in urology clinic for the above issues.  He is a 60 year old African-American male who was diagnosed with favorable intermediate risk prostate cancer in January 2020 for an elevated PSA of 8.  His brachytherapy was slightly delayed secondary to the Covid pandemic, but he ultimately underwent brachytherapy seed implant with myself and Dr. Baruch Gouty on 01/26/2019.  PSA on 06/05/2019 was undetectable.  PSA in April 2021 was 0.3, and 0.6 in October 2021.  We reviewed the concept of a PSA bounce previously, and fortunately PSA is stabilized and is most recently 0.5 in April 2022.  He denies any bone pain or weight loss.  His urinary symptoms have stabilized and he really denies any urinary complaints today and is no longer taking Flomax.  He is taking Cialis 20 mg daily for ED with good results.  Prescription changed to [facility] to continue good Rx price  Cialis 20 mg daily for ED Continue PSA surveillance after prostate cancer, RTC 1 year with PSA prior   Billey Co, MD  [facility] [address] [place], [place] [address] [phone]

## 2020-12-26 ENCOUNTER — Inpatient Hospital Stay: Payer: Medicaid Other

## 2020-12-26 ENCOUNTER — Inpatient Hospital Stay: Payer: Medicaid Other | Admitting: Nurse Practitioner

## 2020-12-27 ENCOUNTER — Inpatient Hospital Stay: Payer: Medicaid Other | Attending: Nurse Practitioner | Admitting: Oncology

## 2020-12-27 ENCOUNTER — Inpatient Hospital Stay: Payer: Medicaid Other

## 2021-03-06 DIAGNOSIS — G4730 Sleep apnea, unspecified: Secondary | ICD-10-CM | POA: Diagnosis not present

## 2021-03-06 DIAGNOSIS — J449 Chronic obstructive pulmonary disease, unspecified: Secondary | ICD-10-CM | POA: Diagnosis not present

## 2021-03-06 DIAGNOSIS — F17210 Nicotine dependence, cigarettes, uncomplicated: Secondary | ICD-10-CM | POA: Diagnosis not present

## 2021-03-06 DIAGNOSIS — I340 Nonrheumatic mitral (valve) insufficiency: Secondary | ICD-10-CM | POA: Diagnosis not present

## 2021-03-06 DIAGNOSIS — R0602 Shortness of breath: Secondary | ICD-10-CM | POA: Diagnosis not present

## 2021-03-06 DIAGNOSIS — E782 Mixed hyperlipidemia: Secondary | ICD-10-CM | POA: Diagnosis not present

## 2021-03-06 DIAGNOSIS — I2510 Atherosclerotic heart disease of native coronary artery without angina pectoris: Secondary | ICD-10-CM | POA: Diagnosis not present

## 2021-03-06 DIAGNOSIS — I10 Essential (primary) hypertension: Secondary | ICD-10-CM | POA: Diagnosis not present

## 2021-04-06 DIAGNOSIS — R7302 Impaired glucose tolerance (oral): Secondary | ICD-10-CM | POA: Diagnosis not present

## 2021-04-06 DIAGNOSIS — E785 Hyperlipidemia, unspecified: Secondary | ICD-10-CM | POA: Diagnosis not present

## 2021-04-06 DIAGNOSIS — Z23 Encounter for immunization: Secondary | ICD-10-CM | POA: Diagnosis not present

## 2021-04-06 DIAGNOSIS — I10 Essential (primary) hypertension: Secondary | ICD-10-CM | POA: Diagnosis not present

## 2021-04-06 DIAGNOSIS — E559 Vitamin D deficiency, unspecified: Secondary | ICD-10-CM | POA: Diagnosis not present

## 2021-04-17 DIAGNOSIS — Z23 Encounter for immunization: Secondary | ICD-10-CM | POA: Diagnosis not present

## 2021-05-08 DIAGNOSIS — Z23 Encounter for immunization: Secondary | ICD-10-CM | POA: Diagnosis not present

## 2021-05-22 DIAGNOSIS — Z23 Encounter for immunization: Secondary | ICD-10-CM | POA: Diagnosis not present

## 2021-06-22 DIAGNOSIS — Z419 Encounter for procedure for purposes other than remedying health state, unspecified: Secondary | ICD-10-CM | POA: Diagnosis not present

## 2021-07-23 DIAGNOSIS — Z419 Encounter for procedure for purposes other than remedying health state, unspecified: Secondary | ICD-10-CM | POA: Diagnosis not present

## 2021-08-07 DIAGNOSIS — J449 Chronic obstructive pulmonary disease, unspecified: Secondary | ICD-10-CM | POA: Diagnosis not present

## 2021-08-07 DIAGNOSIS — I10 Essential (primary) hypertension: Secondary | ICD-10-CM | POA: Diagnosis not present

## 2021-08-07 DIAGNOSIS — E782 Mixed hyperlipidemia: Secondary | ICD-10-CM | POA: Diagnosis not present

## 2021-08-07 DIAGNOSIS — E559 Vitamin D deficiency, unspecified: Secondary | ICD-10-CM | POA: Diagnosis not present

## 2021-08-07 DIAGNOSIS — E785 Hyperlipidemia, unspecified: Secondary | ICD-10-CM | POA: Diagnosis not present

## 2021-08-07 DIAGNOSIS — R7303 Prediabetes: Secondary | ICD-10-CM | POA: Diagnosis not present

## 2021-08-23 DIAGNOSIS — Z419 Encounter for procedure for purposes other than remedying health state, unspecified: Secondary | ICD-10-CM | POA: Diagnosis not present

## 2021-09-20 DIAGNOSIS — Z419 Encounter for procedure for purposes other than remedying health state, unspecified: Secondary | ICD-10-CM | POA: Diagnosis not present

## 2021-10-21 DIAGNOSIS — Z419 Encounter for procedure for purposes other than remedying health state, unspecified: Secondary | ICD-10-CM | POA: Diagnosis not present

## 2021-11-20 DIAGNOSIS — Z419 Encounter for procedure for purposes other than remedying health state, unspecified: Secondary | ICD-10-CM | POA: Diagnosis not present

## 2021-12-07 DIAGNOSIS — E7800 Pure hypercholesterolemia, unspecified: Secondary | ICD-10-CM | POA: Diagnosis not present

## 2021-12-07 DIAGNOSIS — E559 Vitamin D deficiency, unspecified: Secondary | ICD-10-CM | POA: Diagnosis not present

## 2021-12-07 DIAGNOSIS — C61 Malignant neoplasm of prostate: Secondary | ICD-10-CM | POA: Diagnosis not present

## 2021-12-07 DIAGNOSIS — J069 Acute upper respiratory infection, unspecified: Secondary | ICD-10-CM | POA: Diagnosis not present

## 2021-12-07 DIAGNOSIS — R7303 Prediabetes: Secondary | ICD-10-CM | POA: Diagnosis not present

## 2021-12-07 DIAGNOSIS — J302 Other seasonal allergic rhinitis: Secondary | ICD-10-CM | POA: Diagnosis not present

## 2021-12-07 DIAGNOSIS — E785 Hyperlipidemia, unspecified: Secondary | ICD-10-CM | POA: Diagnosis not present

## 2021-12-07 DIAGNOSIS — G4733 Obstructive sleep apnea (adult) (pediatric): Secondary | ICD-10-CM | POA: Diagnosis not present

## 2021-12-07 DIAGNOSIS — I10 Essential (primary) hypertension: Secondary | ICD-10-CM | POA: Diagnosis not present

## 2021-12-07 DIAGNOSIS — J449 Chronic obstructive pulmonary disease, unspecified: Secondary | ICD-10-CM | POA: Diagnosis not present

## 2021-12-08 ENCOUNTER — Other Ambulatory Visit: Payer: Medicaid Other

## 2021-12-08 DIAGNOSIS — C61 Malignant neoplasm of prostate: Secondary | ICD-10-CM

## 2021-12-09 LAB — PSA: Prostate Specific Ag, Serum: 0.3 ng/mL (ref 0.0–4.0)

## 2021-12-14 ENCOUNTER — Ambulatory Visit: Payer: Self-pay | Admitting: Urology

## 2021-12-20 ENCOUNTER — Telehealth: Payer: Self-pay | Admitting: Urology

## 2021-12-20 ENCOUNTER — Other Ambulatory Visit: Payer: Self-pay | Admitting: Urology

## 2021-12-20 DIAGNOSIS — N529 Male erectile dysfunction, unspecified: Secondary | ICD-10-CM

## 2021-12-20 NOTE — Telephone Encounter (Signed)
tadalafil (CIALIS) 20 MG tablet  [facility]

## 2021-12-20 NOTE — Telephone Encounter (Signed)
tadalafil (CIALIS) 20 MG tablet  [facility] on [facility] [address]

## 2021-12-21 DIAGNOSIS — Z419 Encounter for procedure for purposes other than remedying health state, unspecified: Secondary | ICD-10-CM | POA: Diagnosis not present

## 2021-12-27 ENCOUNTER — Ambulatory Visit (INDEPENDENT_AMBULATORY_CARE_PROVIDER_SITE_OTHER): Payer: Medicaid Other | Admitting: Urology

## 2021-12-27 ENCOUNTER — Encounter: Payer: Self-pay | Admitting: Urology

## 2021-12-27 VITALS — BP 122/75 | HR 94 | Ht 71.0 in | Wt 171.0 lb

## 2021-12-27 DIAGNOSIS — Z8546 Personal history of malignant neoplasm of prostate: Secondary | ICD-10-CM | POA: Diagnosis not present

## 2021-12-27 DIAGNOSIS — N529 Male erectile dysfunction, unspecified: Secondary | ICD-10-CM

## 2021-12-27 DIAGNOSIS — C61 Malignant neoplasm of prostate: Secondary | ICD-10-CM

## 2021-12-27 MED ORDER — TADALAFIL 20 MG PO TABS: 20.0000 mg | ORAL_TABLET | Freq: Every day | ORAL | 11 refills | Status: DC

## 2021-12-27 NOTE — Patient Instructions (Signed)
You can try a penile ring along with the Cialis to help with erections.  This was placed over the base of the penis after an erection is obtained, to keep blood flow in the penis and maintain the erection.  This should be removed immediately after sexual activity.  This can be purchased online at [email or web address], or at [employer] stores locally.  Erectile Dysfunction Erectile dysfunction (ED) is the inability to get or keep an erection in order to have sexual intercourse. ED is considered a symptom of an underlying disorder and is not considered a disease. ED may include: Inability to get an erection. Lack of enough hardness of the erection to allow penetration. Loss of erection before sex is finished. What are the causes? This condition may be caused by: Physical causes, such as: Artery problems. This may include heart disease, high blood pressure, atherosclerosis, and diabetes. Hormonal problems, such as low testosterone. Obesity. Nerve problems. This may include back or pelvic injuries, multiple sclerosis, Parkinson's disease, spinal cord injury, and stroke. Certain medicines, such as: Pain relievers. Antidepressants. Blood pressure medicines and water pills (diuretics). Cancer medicines. Antihistamines. Muscle relaxants. Lifestyle factors, such as: Use of drugs such as marijuana, cocaine, or opioids. Excessive use of alcohol. Smoking. Lack of physical activity or exercise. Psychological causes, such as: Anxiety or stress. Sadness or depression. Exhaustion. Fear about sexual performance. Guilt. What are the signs or symptoms? Symptoms of this condition include: Inability to get an erection. Lack of enough hardness of the erection to allow penetration. Loss of the erection before sex is finished. Sometimes having normal erections, but with frequent unsatisfactory episodes. Low sexual satisfaction in either partner due to erection problems. A curved penis occurring with erection.  The curve may cause pain, or the penis may be too curved to allow for intercourse. Never having nighttime or morning erections. How is this diagnosed? This condition is often diagnosed by: Performing a physical exam to find other diseases or specific problems with the penis. Asking you detailed questions about the problem. Doing tests, such as: Blood tests to check for diabetes mellitus or high cholesterol, or to measure hormone levels. Other tests to check for underlying health conditions. An ultrasound exam to check for scarring. A test to check blood flow to the penis. Doing a sleep study at home to measure nighttime erections. How is this treated? This condition may be treated by: Medicines, such as: Medicine taken by mouth to help you achieve an erection (oral medicine). Hormone replacement therapy to replace low testosterone levels. Medicine that is injected into the penis. Your health care provider may instruct you how to give yourself these injections at home. Medicine that is delivered with a short applicator tube. The tube is inserted into the opening at the tip of the penis, which is the opening of the urethra. A tiny pellet of medicine is put in the urethra. The pellet dissolves and enhances erectile function. This is also called MUSE (medicated urethral system for erections) therapy. Vacuum pump. This is a pump with a ring on it. The pump and ring are placed on the penis and used to create pressure that helps the penis become erect. Penile implant surgery. In this procedure, you may receive: An inflatable implant. This consists of cylinders, a pump, and a reservoir. The cylinders can be inflated with a fluid that helps to create an erection, and they can be deflated after intercourse. A semi-rigid implant. This consists of two silicone rubber rods. The rods provide  some rigidity. They are also flexible, so the penis can both curve downward in its normal position and become straight  for sexual intercourse. Blood vessel surgery to improve blood flow to the penis. During this procedure, a blood vessel from a different part of the body is placed into the penis to allow blood to flow around (bypass) damaged or blocked blood vessels. Lifestyle changes, such as exercising more, losing weight, and quitting smoking. Follow these instructions at home: Medicines  Take over-the-counter and prescription medicines only as told by your health care provider. Do not increase the dosage without first discussing it with your health care provider. If you are using self-injections, do injections as directed by your health care provider. Make sure you avoid any veins that are on the surface of the penis. After giving an injection, apply pressure to the injection site for 5 minutes. Talk to your health care provider about how to prevent headaches while taking ED medicines. These medicines may cause a sudden headache due to the increase in blood flow in your body. General instructions Exercise regularly, as directed by your health care provider. Work with your health care provider to lose weight, if needed. Do not use any products that contain nicotine or tobacco. These products include cigarettes, chewing tobacco, and vaping devices, such as e-cigarettes. If you need help quitting, ask your health care provider. Before using a vacuum pump, read the instructions that come with the pump and discuss any questions with your health care provider. Keep all follow-up visits. This is important. Contact a health care provider if: You feel nauseous. You are vomiting. You get sudden headaches while taking ED medicines. You have any concerns about your sexual health. Get help right away if: You are taking oral or injectable medicines and you have an erection that lasts longer than 4 hours. If your health care provider is unavailable, go to the nearest emergency room for evaluation. An erection that lasts  much longer than 4 hours can result in permanent damage to your penis. You have severe pain in your groin or abdomen. You develop redness or severe swelling of your penis. You have redness spreading at your groin or lower abdomen. You are unable to urinate. You experience chest pain or a rapid heartbeat (palpitations) after taking oral medicines. These symptoms may represent a serious problem that is an emergency. Do not wait to see if the symptoms will go away. Get medical help right away. Call your local emergency services (911 in the [place] Do not drive yourself to the hospital. Summary Erectile dysfunction (ED) is the inability to get or keep an erection during sexual intercourse. This condition is diagnosed based on a physical exam, your symptoms, and tests to determine the cause. Treatment varies depending on the cause and may include medicines, hormone therapy, surgery, or a vacuum pump. You may need follow-up visits to make sure that you are using your medicines or devices correctly. Get help right away if you are taking or injecting medicines and you have an erection that lasts longer than 4 hours. This information is not intended to replace advice given to you by your health care provider. Make sure you discuss any questions you have with your health care provider. Document Revised: 10/05/2020 Document Reviewed: 10/05/2020 Elsevier Patient Education  [employer].

## 2021-12-27 NOTE — Progress Notes (Signed)
   12/27/2021 3:21 PM   Clarence Skinner Dec 20, 1960 [id number]  Reason for visit: Follow up prostate cancer, ED   HPI: I saw Clarence Skinner in urology clinic for the above issues.  He is a 61 year old African-American male who was diagnosed with favorable intermediate risk prostate cancer in January 2020 for an elevated PSA of 8.  His brachytherapy was slightly delayed secondary to the Covid pandemic, but he ultimately underwent brachytherapy seed implant with myself and Dr. Baruch Gouty on 01/26/2019.  PSA on 06/05/2019 was undetectable.  PSA in April 2021 was 0.3, and 0.6 in October 2021.  We reviewed the concept of a PSA bounce previously, and PSA is stabilized at 0.5 in April 2022.  Most recent PSA remains stable and low at 0.3.   He was previously on Flomax for urinary symptoms, but those have resolved for about 18 months and no longer needing any medications.  Denies any urinary complaints today.  He is taking Cialis 20 mg daily for ED with moderate results, and some persistent difficulty maintaining erections.  I recommended trying a penile ring with erections to help with maintaining.  I also offered him a trial of sildenafil, but he would like to stay with the Cialis.  He is not interested in penile injections.  Could consider checking testosterone in the future, but with his young age and prostate cancer within the last few years would ideally avoid testosterone replacement.  Cialis 20 mg daily for ED, refilled, consider penile ring to help with maintaining erections Continue PSA surveillance after prostate cancer, RTC 1 year with PSA prior   Billey Co, MD  [facility] [address] [place], [place] [address] [phone]

## 2022-01-02 DIAGNOSIS — M25522 Pain in left elbow: Secondary | ICD-10-CM | POA: Diagnosis not present

## 2022-01-02 DIAGNOSIS — E782 Mixed hyperlipidemia: Secondary | ICD-10-CM | POA: Diagnosis not present

## 2022-01-02 DIAGNOSIS — M19022 Primary osteoarthritis, left elbow: Secondary | ICD-10-CM | POA: Diagnosis not present

## 2022-01-02 DIAGNOSIS — J449 Chronic obstructive pulmonary disease, unspecified: Secondary | ICD-10-CM | POA: Diagnosis not present

## 2022-01-02 DIAGNOSIS — M24122 Other articular cartilage disorders, left elbow: Secondary | ICD-10-CM | POA: Diagnosis not present

## 2022-01-02 DIAGNOSIS — M25422 Effusion, left elbow: Secondary | ICD-10-CM | POA: Diagnosis not present

## 2022-01-02 DIAGNOSIS — M109 Gout, unspecified: Secondary | ICD-10-CM | POA: Diagnosis not present

## 2022-01-10 DIAGNOSIS — M25522 Pain in left elbow: Secondary | ICD-10-CM | POA: Diagnosis not present

## 2022-01-20 DIAGNOSIS — Z419 Encounter for procedure for purposes other than remedying health state, unspecified: Secondary | ICD-10-CM | POA: Diagnosis not present

## 2022-01-26 DIAGNOSIS — M25522 Pain in left elbow: Secondary | ICD-10-CM | POA: Diagnosis not present

## 2022-02-05 DIAGNOSIS — J449 Chronic obstructive pulmonary disease, unspecified: Secondary | ICD-10-CM | POA: Diagnosis not present

## 2022-02-05 DIAGNOSIS — E782 Mixed hyperlipidemia: Secondary | ICD-10-CM | POA: Diagnosis not present

## 2022-02-05 DIAGNOSIS — I341 Nonrheumatic mitral (valve) prolapse: Secondary | ICD-10-CM | POA: Diagnosis not present

## 2022-02-05 DIAGNOSIS — E860 Dehydration: Secondary | ICD-10-CM | POA: Diagnosis not present

## 2022-02-05 DIAGNOSIS — I10 Essential (primary) hypertension: Secondary | ICD-10-CM | POA: Diagnosis not present

## 2022-02-05 DIAGNOSIS — R002 Palpitations: Secondary | ICD-10-CM | POA: Diagnosis not present

## 2022-02-05 DIAGNOSIS — R55 Syncope and collapse: Secondary | ICD-10-CM | POA: Diagnosis not present

## 2022-02-05 DIAGNOSIS — G4733 Obstructive sleep apnea (adult) (pediatric): Secondary | ICD-10-CM | POA: Diagnosis not present

## 2022-02-13 ENCOUNTER — Emergency Department
Admission: EM | Admit: 2022-02-13 | Discharge: 2022-02-13 | Disposition: A | Discharge status: Home / Self Care | Payer: Medicaid Other | Attending: Emergency Medicine | Admitting: Emergency Medicine

## 2022-02-13 ENCOUNTER — Other Ambulatory Visit: Payer: Self-pay

## 2022-02-13 ENCOUNTER — Emergency Department: Payer: Medicaid Other

## 2022-02-13 DIAGNOSIS — Z743 Need for continuous supervision: Secondary | ICD-10-CM | POA: Diagnosis not present

## 2022-02-13 DIAGNOSIS — J449 Chronic obstructive pulmonary disease, unspecified: Secondary | ICD-10-CM | POA: Insufficient documentation

## 2022-02-13 DIAGNOSIS — R42 Dizziness and giddiness: Secondary | ICD-10-CM | POA: Insufficient documentation

## 2022-02-13 DIAGNOSIS — I10 Essential (primary) hypertension: Secondary | ICD-10-CM | POA: Diagnosis not present

## 2022-02-13 DIAGNOSIS — I499 Cardiac arrhythmia, unspecified: Secondary | ICD-10-CM | POA: Diagnosis not present

## 2022-02-13 DIAGNOSIS — R079 Chest pain, unspecified: Secondary | ICD-10-CM | POA: Diagnosis not present

## 2022-02-13 DIAGNOSIS — R001 Bradycardia, unspecified: Secondary | ICD-10-CM

## 2022-02-13 DIAGNOSIS — H9202 Otalgia, left ear: Secondary | ICD-10-CM | POA: Diagnosis not present

## 2022-02-13 DIAGNOSIS — Z8546 Personal history of malignant neoplasm of prostate: Secondary | ICD-10-CM | POA: Diagnosis not present

## 2022-02-13 LAB — CBC WITH DIFFERENTIAL/PLATELET
Abs Immature Granulocytes: 0.03 10*3/uL (ref 0.00–0.07)
Basophils Absolute: 0.1 10*3/uL (ref 0.0–0.1)
Basophils Relative: 1 %
Eosinophils Absolute: 0.1 10*3/uL (ref 0.0–0.5)
Eosinophils Relative: 2 %
HCT: 38.1 % — ABNORMAL LOW (ref 39.0–52.0)
Hemoglobin: 12.2 g/dL — ABNORMAL LOW (ref 13.0–17.0)
Immature Granulocytes: 0 %
Lymphocytes Relative: 33 %
Lymphs Abs: 2.7 10*3/uL (ref 0.7–4.0)
MCH: 24.7 pg — ABNORMAL LOW (ref 26.0–34.0)
MCHC: 32 g/dL (ref 30.0–36.0)
MCV: 77.3 fL — ABNORMAL LOW (ref 80.0–100.0)
Monocytes Absolute: 0.6 10*3/uL (ref 0.1–1.0)
Monocytes Relative: 8 %
Neutro Abs: 4.5 10*3/uL (ref 1.7–7.7)
Neutrophils Relative %: 56 %
Platelets: 260 10*3/uL (ref 150–400)
RBC: 4.93 MIL/uL (ref 4.22–5.81)
RDW: 15.2 % (ref 11.5–15.5)
WBC: 8.1 10*3/uL (ref 4.0–10.5)
nRBC: 0 % (ref 0.0–0.2)

## 2022-02-13 LAB — TROPONIN I (HIGH SENSITIVITY): Troponin I (High Sensitivity): 3 ng/L (ref <18)

## 2022-02-13 LAB — COMPREHENSIVE METABOLIC PANEL
ALT: 11 U/L (ref 0–44)
AST: 17 U/L (ref 15–41)
Albumin: 3.8 g/dL (ref 3.5–5.0)
Alkaline Phosphatase: 68 U/L (ref 38–126)
Anion gap: 6 (ref 5–15)
BUN: 13 mg/dL (ref 6–20)
CO2: 20 mmol/L — ABNORMAL LOW (ref 22–32)
Calcium: 8.5 mg/dL — ABNORMAL LOW (ref 8.9–10.3)
Chloride: 109 mmol/L (ref 98–111)
Creatinine, Ser: 1.09 mg/dL (ref 0.61–1.24)
GFR, Estimated: >60 mL/min (ref >60)
Glucose, Bld: 96 mg/dL (ref 70–99)
Potassium: 3.6 mmol/L (ref 3.5–5.1)
Sodium: 135 mmol/L (ref 135–145)
Total Bilirubin: 0.3 mg/dL (ref 0.3–1.2)
Total Protein: 6.9 g/dL (ref 6.5–8.1)

## 2022-02-13 LAB — BRAIN NATRIURETIC PEPTIDE: B Natriuretic Peptide: 18.4 pg/mL (ref 0.0–100.0)

## 2022-02-13 MED ORDER — GUAIFENESIN 100 MG/5ML PO LIQD
5.0000 mL | Freq: Once | ORAL | Status: AC
  Administered 2022-02-13: 5 mL via ORAL
  Filled 2022-02-13: qty 10

## 2022-02-13 MED ORDER — CIPROFLOXACIN-DEXAMETHASONE 0.3-0.1 % OT SUSP
4.0000 [drp] | Freq: Once | OTIC | Status: AC
  Administered 2022-02-13: 4 [drp] via OTIC
  Filled 2022-02-13: qty 7.5

## 2022-02-13 NOTE — ED Provider Notes (Signed)
[facility] Provider Note   Event Date/Time   First MD Initiated Contact with Patient 02/13/22 2011     (approximate) History  Dizziness (Pt presents to the ED due to dizziness. Pt has hx of bradycardia. Has a follow-up with cardiology next week.  Pt states "its just getting bad". A&Ox4. Denies N/V/D)  HPI Clarence Skinner is a 61 y.o. male with a history of prostate cancer, COPD, and hypertension who presents via EMS due to multiple episodes of the lightheadedness over the past few days.  Patient also states that he has been having bilateral sore throat with swelling underneath the right mandible as well as pain in the left ear.  EMS found patient to be bradycardic upon their arrival with sinus rhythm in the 30s.  Patient states that these lightheadedness episodes usually start after a coughing episode and resolved spontaneously in the next 5-10 minutes. ROS: Patient currently denies any vision changes, tinnitus, difficulty speaking, facial droop, chest pain, shortness of breath, abdominal pain, nausea/vomiting/diarrhea, dysuria, or weakness/numbness/paresthesias in any extremity   Physical Exam  Triage Vital Signs: ED Triage Vitals  Enc Vitals Group     BP 02/13/22 2008 104/82     Pulse Rate 02/13/22 2008 (!) 57     Resp 02/13/22 2008 16     Temp 02/13/22 2008 98.2 F (36.8 C)     Temp Source 02/13/22 2008 Oral     SpO2 02/13/22 2008 98 %     Weight 02/13/22 2011 173 lb (78.5 kg)     Height 02/13/22 2011 '5\' 11"'$  (1.803 m)     Head Circumference --      Peak Flow --      Pain Score 02/13/22 2010 0     Pain Loc --      Pain Edu? --      Excl. in [place]? --    Most recent vital signs: Vitals:   02/13/22 2200 02/13/22 2230  BP: 109/62 108/66  Pulse: (!) 52 (!) 55  Resp: (!) 21 13  Temp:  97.9 F (36.6 C)  SpO2: 96% 99%   General: Awake, oriented x4. CV:  Good peripheral perfusion.  Resp:  Normal effort.  Abd:  No distention.  Other:  Middle-aged  African-American male laying in bed in no acute distress.  Left tympanic membrane mildly erythematous and erythematous posterior oropharynx. ED Results / Procedures / Treatments  Labs (all labs ordered are listed, but only abnormal results are displayed) Labs Reviewed  COMPREHENSIVE METABOLIC PANEL - Abnormal; Notable for the following components:      Result Value   CO2 20 (*)    Calcium 8.5 (*)    All other components within normal limits  CBC WITH DIFFERENTIAL/PLATELET - Abnormal; Notable for the following components:   Hemoglobin 12.2 (*)    HCT 38.1 (*)    MCV 77.3 (*)    MCH 24.7 (*)    All other components within normal limits  BRAIN NATRIURETIC PEPTIDE  TROPONIN I (HIGH SENSITIVITY)  TROPONIN I (HIGH SENSITIVITY)   EKG ED ECG REPORT I, Naaman Plummer, the attending physician, personally viewed and interpreted this ECG. Date: 02/13/2022 EKG Time: 2023 Rate: 61 Rhythm: normal sinus rhythm QRS Axis: normal Intervals: normal ST/T Wave abnormalities: normal Narrative Interpretation: no evidence of acute ischemia RADIOLOGY ED MD interpretation: One-view portable chest x-ray interpreted by me shows no evidence of acute abnormalities including no pneumonia, pneumothorax, or widened mediastinum -Agree with radiology assessment Official radiology  report(s): DG Chest Port 1 View  Result Date: 02/13/2022 CLINICAL DATA:  Chest pain bradycardia EXAM: PORTABLE CHEST 1 VIEW COMPARISON:  05/28/2011, 11/29/2017 FINDINGS: The heart size and mediastinal contours are within normal limits. Both lungs are clear. The visualized skeletal structures are unremarkable. IMPRESSION: No active disease. Electronically Signed   By: Donavan Foil M.D.   On: 02/13/2022 20:41   PROCEDURES: Critical Care performed: No Procedures MEDICATIONS ORDERED IN ED: Medications  guaiFENesin (ROBITUSSIN) 100 MG/5ML liquid 5 mL (5 mLs Oral Given 02/13/22 2252)  ciprofloxacin-dexamethasone (CIPRODEX) 0.3-0.1 % OTIC  (EAR) suspension 4 drop (4 drops Left EAR Given 02/13/22 2253)   IMPRESSION / MDM / ASSESSMENT AND PLAN / ED COURSE  I reviewed the triage vital signs and the nursing notes.                             The patient is on the cardiac monitor to evaluate for evidence of arrhythmia and/or significant heart rate changes. Patient's presentation is most consistent with acute presentation with potential threat to life or bodily function. The patient is suffering from bradycardia without concerning signs of instability on exam such as altered mental status, hypotension, evidence of cardiac end organ dysfunction, or acute heart failure.  Ddx: sick sinus syndrome, vasovagal, unstable heart block (ekg with no signs of Mobitz II, complete heart block), right coronary artery myocardial infarction (neg trop, non STEMI, no chest pain), infection (afebrile, no leukocytosis, no recent illness), hypothyroidism, hyperkalemia, hypoglycemia, dehydration, or intoxication (beta blockade, calcium channel blockade, clonidine, digoxin, opiates, alcohol or other). Workup: CBC, CMP, Trop, EKG, telemetry Results: CBC negative for any acute abnormalities CMP no evidence of acute abnormalities Trop Neg x1 EKG interpreted by me and shows sinus bradycardia  Ptient remained asymptomatic throughout ED course.  Consults: I spoke to Dr. Harrell Gave on-call in cardiology who did not see any concerning signs on EKG for ischemia at this time as well as no evidence of third-degree heart block.  Patient is not on any beta blocking medications. The patient has been reexamined and is ready to be discharged.  All diagnostic results have been reviewed and discussed with the patient/family.  Care plan has been outlined and the patient/family understands all current diagnoses, results, and treatment plans.  There are no new complaints, changes, or physical findings at this time.  All questions have been addressed and answered.  Patient was  instructed to, and agrees to follow-up with their primary care physician as well as return to the emergency department if any new or worsening symptoms develop.  Dispo: Discharge home with PCP/cardiology follow-up   FINAL CLINICAL IMPRESSION(S) / ED DIAGNOSES   Final diagnoses:  Bradycardia  Vagal bradycardia  Left ear pain   Rx / DC Orders   ED Discharge Orders     None      Note:  This document was prepared using Dragon voice recognition software and may include unintentional dictation errors.   Naaman Plummer, MD 02/13/22 [phone]

## 2022-02-13 NOTE — ED Notes (Signed)
Pharmacy contacted requesting they send ordered drops prior to pt discharge

## 2022-02-14 DIAGNOSIS — I10 Essential (primary) hypertension: Secondary | ICD-10-CM | POA: Diagnosis not present

## 2022-02-15 DIAGNOSIS — E782 Mixed hyperlipidemia: Secondary | ICD-10-CM | POA: Diagnosis not present

## 2022-02-15 DIAGNOSIS — I10 Essential (primary) hypertension: Secondary | ICD-10-CM | POA: Diagnosis not present

## 2022-02-15 DIAGNOSIS — R55 Syncope and collapse: Secondary | ICD-10-CM | POA: Diagnosis not present

## 2022-02-15 DIAGNOSIS — R002 Palpitations: Secondary | ICD-10-CM | POA: Diagnosis not present

## 2022-02-20 DIAGNOSIS — Z419 Encounter for procedure for purposes other than remedying health state, unspecified: Secondary | ICD-10-CM | POA: Diagnosis not present

## 2022-02-21 DIAGNOSIS — R55 Syncope and collapse: Secondary | ICD-10-CM | POA: Diagnosis not present

## 2022-02-23 ENCOUNTER — Ambulatory Visit (INDEPENDENT_AMBULATORY_CARE_PROVIDER_SITE_OTHER): Payer: Medicaid Other | Admitting: Cardiology

## 2022-02-23 ENCOUNTER — Encounter: Payer: Self-pay | Admitting: Cardiology

## 2022-02-23 VITALS — BP 156/64 | HR 60 | Ht 71.0 in | Wt 174.0 lb

## 2022-02-23 DIAGNOSIS — I10 Essential (primary) hypertension: Secondary | ICD-10-CM

## 2022-02-23 DIAGNOSIS — R55 Syncope and collapse: Secondary | ICD-10-CM

## 2022-02-23 DIAGNOSIS — I2510 Atherosclerotic heart disease of native coronary artery without angina pectoris: Secondary | ICD-10-CM | POA: Diagnosis not present

## 2022-02-23 DIAGNOSIS — I2584 Coronary atherosclerosis due to calcified coronary lesion: Secondary | ICD-10-CM

## 2022-02-23 DIAGNOSIS — F17200 Nicotine dependence, unspecified, uncomplicated: Secondary | ICD-10-CM | POA: Diagnosis not present

## 2022-02-23 MED ORDER — ASPIRIN 81 MG PO TBEC: 81.0000 mg | DELAYED_RELEASE_TABLET | Freq: Every day | ORAL | 3 refills | Status: DC

## 2022-02-23 MED ORDER — ROSUVASTATIN CALCIUM 20 MG PO TABS: 20.0000 mg | ORAL_TABLET | Freq: Every day | ORAL | 3 refills | Status: DC

## 2022-02-23 NOTE — Progress Notes (Signed)
Cardiology Office Note:    Date:  02/23/2022   ID:  Clarence Skinner, DOB [birth year], MRN [id number]  PCP:  Perrin Maltese, MD   [facility] Providers Cardiologist:  None     Referring MD: Perrin Maltese, MD   Chief Complaint  Patient presents with   New Patient (Initial Visit)    Referred by PCP for a recent ED visit. Patient reports that he had a vasovagal episode and his HR went down to the 30s. Meds reviewed verbally with patient.      History of Present Illness:    Clarence Skinner is a 61 y.o. male with a hx of hypertension, current smoker x30+ years, COPD, OSA who presents due to bradycardia and presyncope  Patient's endorses coughing spells ongoing over the past 3 weeks.  Coughing spells usually lead to headaches, dizziness, presyncope and syncope once.  Denies chest pain, current smoker x30+ years.  Last episode occurred a week ago, patient called EMS, noted to be bradycardic with heart rate in the 30s per EMS personnel.  He was taken to the ED where work-up was unrevealing, heart rates improved in the ED to 50s.  Has appointment with ENT upcoming.  Has a cardiac monitor x7 days placed by primary care provider.  Past Medical History:  Diagnosis Date   COPD (chronic obstructive pulmonary disease) (HCC)    GERD (gastroesophageal reflux disease)    Gout    Hypertension    Multilevel degenerative disc disease    Pneumonia 11/29/2017   Sleep apnea    CPAP   Tachycardia    Wears dentures    partial upper and lower    Past Surgical History:  Procedure Laterality Date   ABCESS DRAINAGE  2009   tonsil   COLONOSCOPY WITH PROPOFOL N/A 02/11/2018   Procedure: COLONOSCOPY WITH PROPOFOL;  Surgeon: Toledo, Benay Pike, MD;  Location: [facility];  Service: Endoscopy;  Laterality: N/A;   ESOPHAGOGASTRODUODENOSCOPY (EGD) WITH PROPOFOL N/A 02/11/2018   Procedure: ESOPHAGOGASTRODUODENOSCOPY (EGD) WITH PROPOFOL;  Surgeon: Toledo, Benay Pike, MD;  Location: [facility];   Service: Endoscopy;  Laterality: N/A;   MASS EXCISION Right 08/14/2017   Procedure: EXCISION RIGHT TEMPORAL MASS SUBMENTAL MASS;  Surgeon: Carloyn Manner, MD;  Location: [place];  Service: ENT;  Laterality: Right;  sleep apnea   RADIOACTIVE SEED IMPLANT N/A 01/26/2019   Procedure: RADIOACTIVE SEED IMPLANT/BRACHYTHERAPY IMPLANT;  Surgeon: Billey Co, MD;  Location: [facility];  Service: Urology;  Laterality: N/A;   SEPTOPLASTY  2016   [place]   VOLUME STUDY N/A 12/29/2018   Procedure: VOLUME STUDY;  Surgeon: Billey Co, MD;  Location: [facility];  Service: Urology;  Laterality: N/A;    Current Medications: Current Meds  Medication Sig   albuterol (PROVENTIL HFA;VENTOLIN HFA) 108 (90 Base) MCG/ACT inhaler Inhale into the lungs every 6 (six) hours as needed for wheezing or shortness of breath.   allopurinol (ZYLOPRIM) 300 MG tablet Take 300 mg by mouth daily.   amLODipine-benazepril (LOTREL) 10-40 MG capsule Take 1 capsule by mouth daily.   aspirin EC 81 MG tablet Take 1 tablet (81 mg total) by mouth daily. Swallow whole.   Azelastine HCl 137 MCG/SPRAY SOLN Place 1 spray into the nose daily as needed (allergies).    ferrous sulfate 325 (65 FE) MG tablet Take 325 mg by mouth daily.   fluticasone (FLONASE) 50 MCG/ACT nasal spray Place into both nostrils daily as needed for allergies or rhinitis.   Fluticasone-Umeclidin-Vilant (  TRELEGY ELLIPTA) 100-62.5-25 MCG/ACT AEPB Trelegy Ellipta 100-62.5-25 MCG/INH Inhalation Aerosol Powder Breath Activated QTY: 90 each Days: 90 Refills: 1  Written: 11/02/20 Patient Instructions: One puff once a day   ibuprofen (ADVIL) 800 MG tablet SMARTSIG:1 Tablet(s) By Mouth Every 12 Hours PRN   loratadine (CLARITIN) 10 MG tablet Take 10 mg by mouth daily.   meloxicam (MOBIC) 7.5 MG tablet Take 7.5 mg by mouth daily as needed for pain.   pantoprazole (PROTONIX) 40 MG tablet Take 40 mg by mouth daily.   rosuvastatin (CRESTOR) 20 MG tablet Take 1  tablet (20 mg total) by mouth daily.   tadalafil (CIALIS) 20 MG tablet Take 1 tablet (20 mg total) by mouth daily.   Vitamin D, Ergocalciferol, (DRISDOL) 1.25 MG (50000 UNIT) CAPS capsule Take 50,000 Units by mouth once a week.     Allergies:   Chocolate, Eggs or egg-derived products, Milk (cow), Benadryl [diphenhydramine], and Milk-related compounds   Social History   Socioeconomic History   Marital status: Significant Other    Spouse name: Not on file   Number of children: Not on file   Years of education: Not on file   Highest education level: Not on file  Occupational History   Not on file  Tobacco Use   Smoking status: Every Day    Packs/day: 1.00    Years: 30.00    Total pack years: 30.00    Types: Cigarettes   Smokeless tobacco: Former    Types: Snuff, Chew    Quit date: 12/05/1984   Tobacco comments:    since age 66  Vaping Use   Vaping Use: Never used  Substance and Sexual Activity   Alcohol use: Yes    Alcohol/week: 12.0 standard drinks of alcohol    Types: 12 Cans of beer per week    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  ROS:   Please see the history of present illness.     All other systems reviewed and are negative.  EKGs/Labs/Other Studies Reviewed:    The following studies were reviewed today:   EKG:  EKG is  ordered today.  The ekg ordered today demonstrates normal  sinus rhythm, normal ECG  Recent Labs: 02/13/2022: ALT 11; B Natriuretic Peptide 18.4; BUN 13; Creatinine, Ser 1.09; Hemoglobin 12.2; Platelets 260; Potassium 3.6; Sodium 135  Recent Lipid Panel No results found for: "CHOL", "TRIG", "HDL", "CHOLHDL", "VLDL", "LDLCALC", "LDLDIRECT"   Risk Assessment/Calculations:          Physical Exam:    VS:  BP (!) 156/64 (BP Location: Left Arm, Patient Position: Sitting, Cuff Size: Normal)   Pulse 60   Ht '5\' 11"'$  (1.803 m)   Wt 174 lb (78.9 kg)   SpO2 95%   BMI 24.27 kg/m     Wt Readings from Last 3 Encounters:  02/23/22 174  lb (78.9 kg)  02/13/22 173 lb (78.5 kg)  12/27/21 171 lb (77.6 kg)     GEN:  Well nourished, well developed in no acute distress HEENT: Normal NECK: No JVD; No carotid bruits LYMPHATICS: No lymphadenopathy CARDIAC: RRR, no murmurs, rubs, gallops RESPIRATORY: Decreased breath sounds bilaterally ABDOMEN: Soft, non-tender, non-distended MUSCULOSKELETAL:  No edema; No deformity  SKIN: Warm and dry NEUROLOGIC:  Alert and oriented x 3 PSYCHIATRIC:  Normal affect   ASSESSMENT:    1. Syncope and collapse   2. Coronary artery calcification   3. Primary hypertension   4. Smoking    PLAN:    In order of problems listed above:  Bradycardia, presyncope, possible syncope.  Symptoms appear to be cough induced leading to bradycardia, dizziness.  Likely has cough induced syncope.  We will reach out to primary care provider to obtain cardiac monitor results.  Get echo to evaluate any structural abnormalities.  Smoking cessation advised as this will help reduce cough. LAD calcifications on chest CT.  Start aspirin 81 mg, Crestor 20.  Obtain fasting lipid profile. Hypertension, continue amlodipine, benazepril.  Consider switching benazepril to losartan at follow-up visit if cough persists.  I still think cough is likely secondary to patient smoking/COPD Current smoker, smoking cessation advised.  Follow-up after echo in 6  weeks       Medication Adjustments/Labs and Tests Ordered: Current medicines are reviewed at length with the patient today.  Concerns regarding medicines are outlined above.  Orders Placed This Encounter  Procedures   EKG 12-Lead   ECHOCARDIOGRAM COMPLETE   Meds ordered this encounter  Medications   aspirin EC 81 MG tablet    Sig: Take 1 tablet (81 mg total) by mouth daily. Swallow whole.    Dispense:  90 tablet    Refill:  3   rosuvastatin (CRESTOR) 20 MG tablet    Sig: Take 1 tablet (20 mg total) by mouth daily.    Dispense:  30 tablet    Refill:  3    Patient Instructions  Medication Instructions:   Your physician has recommended you make the following change in your medication:    START taking Rosuvastatin 20 MG once a day.  2.    START taking Aspirin 81 MG once a day. You can buy this over the counter.  *If you need a refill on your cardiac medications before your next appointment, please call your pharmacy*    Testing/Procedures:   Your physician has requested that you have an echocardiogram. Echocardiography is a painless test that uses sound waves to create images of your heart. It provides your doctor with information about the size and shape of your heart and how well your heart's chambers and valves are working. This procedure takes approximately one hour. There are no restrictions for this procedure.   Follow-Up: At [facility], you and your health needs are our priority.  As part of our continuing mission to provide you with exceptional heart care, we have created designated Provider Care Teams.  These Care Teams include your primary Cardiologist (physician) and Advanced Practice Providers (APPs -  Physician Assistants and Nurse Practitioners) who all work together to provide you with the care you need, when you need it.  We recommend signing up for the patient portal called "MyChart".  Sign up information is provided on this After Visit Summary.  MyChart  is used to connect with patients for Virtual Visits (Telemedicine).  Patients are able to view lab/test results, encounter notes,  upcoming appointments, etc.  Non-urgent messages can be sent to your provider as well.   To learn more about what you can do with MyChart, go to [email or web address].    Your next appointment:   Follow up after Echo   The format for your next appointment:   In Person  Provider:   You may see Kate Sable, MD or one of the following Advanced Practice Providers on your designated Care Team:   Murray Hodgkins, NP Christell Faith, PA-C Cadence Kathlen Mody, [place]     Important Information About Sugar         Signed, Kate Sable, MD  02/23/2022 12:38 PM    [place]

## 2022-02-23 NOTE — Patient Instructions (Signed)
Medication Instructions:   Your physician has recommended you make the following change in your medication:    START taking Rosuvastatin 20 MG once a day.  2.    START taking Aspirin 81 MG once a day. You can buy this over the counter.  *If you need a refill on your cardiac medications before your next appointment, please call your pharmacy*    Testing/Procedures:   Your physician has requested that you have an echocardiogram. Echocardiography is a painless test that uses sound waves to create images of your heart. It provides your doctor with information about the size and shape of your heart and how well your heart's chambers and valves are working. This procedure takes approximately one hour. There are no restrictions for this procedure.   Follow-Up: At [facility], you and your health needs are our priority.  As part of our continuing mission to provide you with exceptional heart care, we have created designated Provider Care Teams.  These Care Teams include your primary Cardiologist (physician) and Advanced Practice Providers (APPs -  Physician Assistants and Nurse Practitioners) who all work together to provide you with the care you need, when you need it.  We recommend signing up for the patient portal called "MyChart".  Sign up information is provided on this After Visit Summary.  MyChart is used to connect with patients for Virtual Visits (Telemedicine).  Patients are able to view lab/test results, encounter notes, upcoming appointments, etc.  Non-urgent messages can be sent to your provider as well.   To learn more about what you can do with MyChart, go to [email or web address].    Your next appointment:   Follow up after Echo   The format for your next appointment:   In Person  Provider:   You may see Kate Sable, MD or one of the following Advanced Practice Providers on your designated Care Team:   Murray Hodgkins, NP Christell Faith, PA-C Cadence Kathlen Mody,  [place]     Important Information About Sugar

## 2022-02-26 ENCOUNTER — Emergency Department: Payer: Medicaid Other

## 2022-02-26 ENCOUNTER — Other Ambulatory Visit: Payer: Self-pay

## 2022-02-26 ENCOUNTER — Inpatient Hospital Stay
Admission: EM | Admit: 2022-02-26 | Discharge: 2022-03-01 | DRG: 144 | Disposition: A | Discharge status: Home / Self Care | Payer: Medicaid Other | Attending: Internal Medicine | Admitting: Internal Medicine

## 2022-02-26 DIAGNOSIS — D649 Anemia, unspecified: Secondary | ICD-10-CM | POA: Diagnosis present

## 2022-02-26 DIAGNOSIS — C770 Secondary and unspecified malignant neoplasm of lymph nodes of head, face and neck: Secondary | ICD-10-CM | POA: Diagnosis present

## 2022-02-26 DIAGNOSIS — I959 Hypotension, unspecified: Secondary | ICD-10-CM | POA: Diagnosis not present

## 2022-02-26 DIAGNOSIS — R55 Syncope and collapse: Secondary | ICD-10-CM | POA: Diagnosis not present

## 2022-02-26 DIAGNOSIS — R531 Weakness: Secondary | ICD-10-CM | POA: Diagnosis not present

## 2022-02-26 DIAGNOSIS — Z818 Family history of other mental and behavioral disorders: Secondary | ICD-10-CM

## 2022-02-26 DIAGNOSIS — Z743 Need for continuous supervision: Secondary | ICD-10-CM | POA: Diagnosis not present

## 2022-02-26 DIAGNOSIS — Z888 Allergy status to other drugs, medicaments and biological substances status: Secondary | ICD-10-CM

## 2022-02-26 DIAGNOSIS — M542 Cervicalgia: Secondary | ICD-10-CM | POA: Diagnosis present

## 2022-02-26 DIAGNOSIS — R221 Localized swelling, mass and lump, neck: Secondary | ICD-10-CM

## 2022-02-26 DIAGNOSIS — E876 Hypokalemia: Secondary | ICD-10-CM | POA: Diagnosis present

## 2022-02-26 DIAGNOSIS — J449 Chronic obstructive pulmonary disease, unspecified: Secondary | ICD-10-CM | POA: Diagnosis present

## 2022-02-26 DIAGNOSIS — M109 Gout, unspecified: Secondary | ICD-10-CM | POA: Diagnosis present

## 2022-02-26 DIAGNOSIS — K219 Gastro-esophageal reflux disease without esophagitis: Secondary | ICD-10-CM | POA: Diagnosis present

## 2022-02-26 DIAGNOSIS — D509 Iron deficiency anemia, unspecified: Secondary | ICD-10-CM | POA: Diagnosis present

## 2022-02-26 DIAGNOSIS — Z7982 Long term (current) use of aspirin: Secondary | ICD-10-CM

## 2022-02-26 DIAGNOSIS — I10 Essential (primary) hypertension: Secondary | ICD-10-CM | POA: Diagnosis present

## 2022-02-26 DIAGNOSIS — I6523 Occlusion and stenosis of bilateral carotid arteries: Secondary | ICD-10-CM | POA: Diagnosis not present

## 2022-02-26 DIAGNOSIS — Z7951 Long term (current) use of inhaled steroids: Secondary | ICD-10-CM

## 2022-02-26 DIAGNOSIS — Z825 Family history of asthma and other chronic lower respiratory diseases: Secondary | ICD-10-CM

## 2022-02-26 DIAGNOSIS — I499 Cardiac arrhythmia, unspecified: Secondary | ICD-10-CM | POA: Diagnosis not present

## 2022-02-26 DIAGNOSIS — J358 Other chronic diseases of tonsils and adenoids: Secondary | ICD-10-CM | POA: Diagnosis present

## 2022-02-26 DIAGNOSIS — Z833 Family history of diabetes mellitus: Secondary | ICD-10-CM

## 2022-02-26 DIAGNOSIS — Z79899 Other long term (current) drug therapy: Secondary | ICD-10-CM

## 2022-02-26 DIAGNOSIS — Z8249 Family history of ischemic heart disease and other diseases of the circulatory system: Secondary | ICD-10-CM

## 2022-02-26 DIAGNOSIS — Z8546 Personal history of malignant neoplasm of prostate: Secondary | ICD-10-CM

## 2022-02-26 DIAGNOSIS — R001 Bradycardia, unspecified: Secondary | ICD-10-CM | POA: Diagnosis present

## 2022-02-26 DIAGNOSIS — R112 Nausea with vomiting, unspecified: Secondary | ICD-10-CM | POA: Diagnosis present

## 2022-02-26 DIAGNOSIS — E663 Overweight: Secondary | ICD-10-CM | POA: Diagnosis present

## 2022-02-26 DIAGNOSIS — M47812 Spondylosis without myelopathy or radiculopathy, cervical region: Secondary | ICD-10-CM | POA: Diagnosis not present

## 2022-02-26 DIAGNOSIS — I672 Cerebral atherosclerosis: Secondary | ICD-10-CM | POA: Diagnosis not present

## 2022-02-26 DIAGNOSIS — G4733 Obstructive sleep apnea (adult) (pediatric): Secondary | ICD-10-CM | POA: Diagnosis present

## 2022-02-26 DIAGNOSIS — J3489 Other specified disorders of nose and nasal sinuses: Secondary | ICD-10-CM | POA: Diagnosis not present

## 2022-02-26 DIAGNOSIS — C099 Malignant neoplasm of tonsil, unspecified: Principal | ICD-10-CM | POA: Diagnosis present

## 2022-02-26 DIAGNOSIS — F17210 Nicotine dependence, cigarettes, uncomplicated: Secondary | ICD-10-CM | POA: Diagnosis present

## 2022-02-26 LAB — CBC WITH DIFFERENTIAL/PLATELET
Abs Immature Granulocytes: 0.03 10*3/uL (ref 0.00–0.07)
Basophils Absolute: 0.1 10*3/uL (ref 0.0–0.1)
Basophils Relative: 2 %
Eosinophils Absolute: 0.1 10*3/uL (ref 0.0–0.5)
Eosinophils Relative: 2 %
HCT: 39.2 % (ref 39.0–52.0)
Hemoglobin: 12.7 g/dL — ABNORMAL LOW (ref 13.0–17.0)
Immature Granulocytes: 0 %
Lymphocytes Relative: 38 %
Lymphs Abs: 2.6 10*3/uL (ref 0.7–4.0)
MCH: 24.3 pg — ABNORMAL LOW (ref 26.0–34.0)
MCHC: 32.4 g/dL (ref 30.0–36.0)
MCV: 75 fL — ABNORMAL LOW (ref 80.0–100.0)
Monocytes Absolute: 0.5 10*3/uL (ref 0.1–1.0)
Monocytes Relative: 7 %
Neutro Abs: 3.6 10*3/uL (ref 1.7–7.7)
Neutrophils Relative %: 51 %
Platelets: 316 10*3/uL (ref 150–400)
RBC: 5.23 MIL/uL (ref 4.22–5.81)
RDW: 15.2 % (ref 11.5–15.5)
WBC: 6.9 10*3/uL (ref 4.0–10.5)
nRBC: 0 % (ref 0.0–0.2)

## 2022-02-26 LAB — LACTIC ACID, PLASMA
Lactic Acid, Venous: 3.2 mmol/L (ref 0.5–1.9)
Lactic Acid, Venous: 1.9 mmol/L (ref 0.5–1.9)

## 2022-02-26 LAB — COMPREHENSIVE METABOLIC PANEL
ALT: 9 U/L (ref 0–44)
AST: 15 U/L (ref 15–41)
Albumin: 3.7 g/dL (ref 3.5–5.0)
Alkaline Phosphatase: 54 U/L (ref 38–126)
Anion gap: 10 (ref 5–15)
BUN: 12 mg/dL (ref 6–20)
CO2: 20 mmol/L — ABNORMAL LOW (ref 22–32)
Calcium: 8.1 mg/dL — ABNORMAL LOW (ref 8.9–10.3)
Chloride: 108 mmol/L (ref 98–111)
Creatinine, Ser: 1.26 mg/dL — ABNORMAL HIGH (ref 0.61–1.24)
GFR, Estimated: >60 mL/min (ref >60)
Glucose, Bld: 90 mg/dL (ref 70–99)
Potassium: 3.3 mmol/L — ABNORMAL LOW (ref 3.5–5.1)
Sodium: 138 mmol/L (ref 135–145)
Total Bilirubin: 0.5 mg/dL (ref 0.3–1.2)
Total Protein: 6.6 g/dL (ref 6.5–8.1)

## 2022-02-26 LAB — BRAIN NATRIURETIC PEPTIDE: B Natriuretic Peptide: 14.2 pg/mL (ref 0.0–100.0)

## 2022-02-26 LAB — MAGNESIUM: Magnesium: 1.9 mg/dL (ref 1.7–2.4)

## 2022-02-26 LAB — TROPONIN I (HIGH SENSITIVITY)
Troponin I (High Sensitivity): 3 ng/L (ref <18)
Troponin I (High Sensitivity): 4 ng/L (ref <18)

## 2022-02-26 MED ORDER — SODIUM CHLORIDE 0.9 % IV BOLUS
1000.0000 mL | Freq: Once | INTRAVENOUS | Status: AC
  Administered 2022-02-26: 1000 mL via INTRAVENOUS

## 2022-02-26 MED ORDER — ASPIRIN 81 MG PO TBEC
81.0000 mg | DELAYED_RELEASE_TABLET | Freq: Every day | ORAL | Status: DC
  Administered 2022-02-26 – 2022-03-01 (×4): 81 mg via ORAL
  Filled 2022-02-26 (×4): qty 1

## 2022-02-26 MED ORDER — THIAMINE HCL 100 MG/ML IJ SOLN
100.0000 mg | Freq: Every day | INTRAMUSCULAR | Status: DC
  Administered 2022-02-27 (×2): 100 mg via INTRAVENOUS
  Filled 2022-02-26 (×2): qty 2

## 2022-02-26 MED ORDER — ONDANSETRON HCL 4 MG/2ML IJ SOLN
4.0000 mg | Freq: Once | INTRAMUSCULAR | Status: AC
  Administered 2022-02-26: 4 mg via INTRAVENOUS
  Filled 2022-02-26: qty 2

## 2022-02-26 MED ORDER — SODIUM CHLORIDE 0.9 % IV SOLN: INTRAVENOUS | Status: AC

## 2022-02-26 MED ORDER — ALLOPURINOL 100 MG PO TABS
300.0000 mg | ORAL_TABLET | Freq: Every day | ORAL | Status: DC
  Administered 2022-02-27 – 2022-03-01 (×3): 300 mg via ORAL
  Filled 2022-02-26 (×3): qty 3

## 2022-02-26 MED ORDER — KETOROLAC TROMETHAMINE 30 MG/ML IJ SOLN
15.0000 mg | Freq: Once | INTRAMUSCULAR | Status: AC
  Administered 2022-02-26: 15 mg via INTRAVENOUS
  Filled 2022-02-26: qty 1

## 2022-02-26 MED ORDER — PANTOPRAZOLE SODIUM 40 MG PO TBEC
40.0000 mg | DELAYED_RELEASE_TABLET | Freq: Every day | ORAL | Status: DC
  Administered 2022-02-27 – 2022-03-01 (×3): 40 mg via ORAL
  Filled 2022-02-26 (×3): qty 1

## 2022-02-26 MED ORDER — ALBUTEROL SULFATE (2.5 MG/3ML) 0.083% IN NEBU: 2.5000 mg | INHALATION_SOLUTION | Freq: Four times a day (QID) | RESPIRATORY_TRACT | Status: DC | PRN

## 2022-02-26 MED ORDER — SODIUM CHLORIDE 0.9% FLUSH
3.0000 mL | Freq: Two times a day (BID) | INTRAVENOUS | Status: DC
  Administered 2022-02-27 – 2022-03-01 (×6): 3 mL via INTRAVENOUS

## 2022-02-26 MED ORDER — HYDROCODONE-ACETAMINOPHEN 5-325 MG PO TABS
1.0000 | ORAL_TABLET | ORAL | Status: DC | PRN
  Administered 2022-02-26: 1 via ORAL
  Filled 2022-02-26: qty 1

## 2022-02-26 MED ORDER — MORPHINE SULFATE (PF) 2 MG/ML IV SOLN
2.0000 mg | INTRAVENOUS | Status: DC | PRN
  Administered 2022-02-27 – 2022-03-01 (×6): 2 mg via INTRAVENOUS
  Filled 2022-02-26 (×6): qty 1

## 2022-02-26 MED ORDER — AMLODIPINE BESYLATE 5 MG PO TABS: 5.0000 mg | ORAL_TABLET | Freq: Every day | ORAL | Status: DC

## 2022-02-26 MED ORDER — DEXAMETHASONE SODIUM PHOSPHATE 10 MG/ML IJ SOLN
10.0000 mg | Freq: Once | INTRAMUSCULAR | Status: AC
  Administered 2022-02-26: 10 mg via INTRAVENOUS
  Filled 2022-02-26: qty 1

## 2022-02-26 MED ORDER — FENTANYL CITRATE PF 50 MCG/ML IJ SOSY
50.0000 ug | PREFILLED_SYRINGE | Freq: Once | INTRAMUSCULAR | Status: AC
  Administered 2022-02-26: 50 ug via INTRAVENOUS
  Filled 2022-02-26: qty 1

## 2022-02-26 MED ORDER — IOHEXOL 350 MG/ML SOLN
75.0000 mL | Freq: Once | INTRAVENOUS | Status: AC | PRN
  Administered 2022-02-26: 75 mL via INTRAVENOUS

## 2022-02-26 MED ORDER — ROSUVASTATIN CALCIUM 20 MG PO TABS
20.0000 mg | ORAL_TABLET | Freq: Every day | ORAL | Status: DC
  Administered 2022-02-27 – 2022-03-01 (×3): 20 mg via ORAL
  Filled 2022-02-26 (×3): qty 1

## 2022-02-26 MED ORDER — NICOTINE 21 MG/24HR TD PT24
21.0000 mg | MEDICATED_PATCH | Freq: Every day | TRANSDERMAL | Status: DC
  Filled 2022-02-26 (×2): qty 1

## 2022-02-26 MED ORDER — ACETAMINOPHEN 325 MG PO TABS: 650.0000 mg | ORAL_TABLET | Freq: Four times a day (QID) | ORAL | Status: DC | PRN

## 2022-02-26 MED ORDER — ACETAMINOPHEN 650 MG RE SUPP: 650.0000 mg | Freq: Four times a day (QID) | RECTAL | Status: DC | PRN

## 2022-02-26 NOTE — Progress Notes (Signed)
Pt rude to this RN and Haynes Hoehn, RN primary nurse.  When patient asked admission questions he told this RN it was none of my business and that this RN was being rude.  Pt non-complaint with safety precautions and refusing full orthostatic vitals.  No further needs voiced from patient nor primary RN Pia at this time.

## 2022-02-26 NOTE — ED Triage Notes (Signed)
X 2 syncopal episodes , initially 70/30 bp, heart rate 1010's down to 20's

## 2022-02-26 NOTE — Hospital Course (Addendum)
Pt coming for passing out . Pt was in courtroom , he felt nauseated, diaphoresis and had to vomit in courthouse. He went out and sat down and felt like he was passing out. Pt is poor historian.  This episode  duration unknown. Has been having similar episodes in past - last week similar episodes. Pt gets irate when providing details of episode.  First time it happened was in July 2023 when he was [facility].  Pt has monitor for one week.  Pt has seen Dr.Agbor-Etang. - [facility].  Pt has not seen anybody else for this episode.  Pt denies any other symptoms except for neck pain started about for several years - left side.  ENT appt on next Wednesday for neck LAD.

## 2022-02-26 NOTE — Progress Notes (Signed)
02/26/2022 at 2242:  Pt refused standing orthostatic BP.

## 2022-02-26 NOTE — ED Triage Notes (Signed)
Near syncopal x 2 episodes, no loc,

## 2022-02-26 NOTE — ED Provider Notes (Signed)
[facility] Provider Note    Event Date/Time   First MD Initiated Contact with Patient 02/26/22 1504     (approximate)   History   Loss of Consciousness (X 2 episodes of syncope, brady cardia )   HPI  Clarence Skinner is a 61 y.o. male with past medical history of hypertension, recurrent syncope, here with multiple syncopal episodes today.  The patient is currently being worked up as an outpatient for this with cardiology.  He states that for the last month and a half, he has had increasing right neck pain, as well as recurrent episodes in which he feels lightheaded, dizzy, has some tunnel vision, then passes out.  He feels nauseous and breaks out in a sweat during these.  He has no chest pain during these.  He currently is wearing a cardiac monitor for this prescribed by cardiology.  No history of similar symptoms.  Of note, he states that this all began when he began having right-sided neck pain.  Denies any recent infectious symptoms but he does feel like he may have had an ear infection at the beginning of his symptoms.  No other alleviating factors.  Today, he has had multiple episodes of passing out and reportedly with EMS, had heart rate as low as the 20s.     Physical Exam   Triage Vital Signs: ED Triage Vitals  Enc Vitals Group     BP 02/26/22 1438 104/63     Pulse Rate 02/26/22 1434 85     Resp 02/26/22 1434 17     Temp 02/26/22 1434 98.5 F (36.9 C)     Temp Source 02/26/22 1434 Oral     SpO2 02/26/22 1434 98 %     Weight 02/26/22 1434 187 lb 6.3 oz (85 kg)     Height 02/26/22 1434 '5\' 9"'$  (1.753 m)     Head Circumference --      Peak Flow --      Pain Score --      Pain Loc --      Pain Edu? --      Excl. in [place]? --     Most recent vital signs: Vitals:   02/26/22 2208 02/26/22 2238  BP:  117/63  Pulse:  62  Resp:    Temp: 98.8 F (37.1 C) 98.2 F (36.8 C)  SpO2:  94%     General: Awake, no distress.  CV:  Good peripheral  perfusion.  Bradycardic.  No murmurs Resp:  Normal effort.  Lungs clear bilaterally. Abd:  No distention.  No tenderness Other:  No neurological deficits.  On ENT exam, patient has what appears to be a soft, but enlarged lymph node or mass in the right anterior chain/submandibular area, with surrounding tenderness.  Right tympanic membrane is normal.  Oropharynx is clear without appreciable mass or tonsillar swelling or asymmetry.  Moist mucous membranes.   ED Results / Procedures / Treatments   Labs (all labs ordered are listed, but only abnormal results are displayed) Labs Reviewed  CBC WITH DIFFERENTIAL/PLATELET - Abnormal; Notable for the following components:      Result Value   Hemoglobin 12.7 (*)    MCV 75.0 (*)    MCH 24.3 (*)    All other components within normal limits  COMPREHENSIVE METABOLIC PANEL - Abnormal; Notable for the following components:   Potassium 3.3 (*)    CO2 20 (*)    Creatinine, Ser 1.26 (*)    Calcium  8.1 (*)    All other components within normal limits  LACTIC ACID, PLASMA - Abnormal; Notable for the following components:   Lactic Acid, Venous 3.2 (*)    All other components within normal limits  MAGNESIUM  BRAIN NATRIURETIC PEPTIDE  LACTIC ACID, PLASMA  COMPREHENSIVE METABOLIC PANEL  CBC  HIV ANTIBODY (ROUTINE TESTING W REFLEX)  TROPONIN I (HIGH SENSITIVITY)  TROPONIN I (HIGH SENSITIVITY)     EKG Normal sinus rhythm, ventricular at 77.  PR 147, QRS 94, QTc 457.  No acute ST elevations or depressions.  Likely J-point elevation in the anterior leads.   RADIOLOGY CT Angio Head/Neck: Large soft tissue focus R proximal ICA/carotid bifurcation, soft tissue surrounding mid-to-distal cervical R ICA CXR: Clear   I also independently reviewed and agree with radiologist interpretations.   PROCEDURES:  Critical Care performed: No  .1-3 Lead EKG Interpretation  Performed by: Duffy Bruce, MD Authorized by: Duffy Bruce, MD      Interpretation: abnormal     ECG rate:  50-60   ECG rate assessment: bradycardic     Rhythm: sinus tachycardia     Ectopy: none     Conduction: normal   Comments:     Indication: syncope     MEDICATIONS ORDERED IN ED: Medications  albuterol (PROVENTIL) (2.5 MG/3ML) 0.083% nebulizer solution 2.5 mg (has no administration in time range)  allopurinol (ZYLOPRIM) tablet 300 mg (has no administration in time range)  amLODipine (NORVASC) tablet 5 mg (has no administration in time range)  aspirin EC tablet 81 mg (81 mg Oral Given 02/26/22 2335)  rosuvastatin (CRESTOR) tablet 20 mg (has no administration in time range)  pantoprazole (PROTONIX) EC tablet 40 mg (has no administration in time range)  sodium chloride flush (NS) 0.9 % injection 3 mL (3 mLs Intravenous Given 02/27/22 0105)  0.9 %  sodium chloride infusion ( Intravenous New Bag/Given 02/27/22 0110)  acetaminophen (TYLENOL) tablet 650 mg (has no administration in time range)    Or  acetaminophen (TYLENOL) suppository 650 mg (has no administration in time range)  HYDROcodone-acetaminophen (NORCO/VICODIN) 5-325 MG per tablet 1 tablet (1 tablet Oral Given 02/26/22 2342)  morphine (PF) 2 MG/ML injection 2 mg (has no administration in time range)  nicotine (NICODERM CQ - dosed in mg/24 hours) patch 21 mg (21 mg Transdermal Patient Refused/Not Given 02/26/22 2335)  thiamine (VITAMIN B1) injection 100 mg (100 mg Intravenous Given 02/27/22 0106)  sodium chloride 0.9 % bolus 1,000 mL (0 mLs Intravenous Stopped 02/26/22 1632)  ondansetron (ZOFRAN) injection 4 mg (4 mg Intravenous Given 02/26/22 1535)  fentaNYL (SUBLIMAZE) injection 50 mcg (50 mcg Intravenous Given 02/26/22 1535)  ketorolac (TORADOL) 30 MG/ML injection 15 mg (15 mg Intravenous Given 02/26/22 1535)  iohexol (OMNIPAQUE) 350 MG/ML injection 75 mL (75 mLs Intravenous Contrast Given 02/26/22 1615)  dexamethasone (DECADRON) injection 10 mg (10 mg Intravenous Given 02/26/22 1823)  sodium chloride 0.9 %  bolus 1,000 mL (0 mLs Intravenous Stopped 02/26/22 2208)     IMPRESSION / MDM / ASSESSMENT AND PLAN / ED COURSE  I reviewed the triage vital signs and the nursing notes.                               The patient is on the cardiac monitor to evaluate for evidence of arrhythmia and/or significant heart rate changes.   Ddx:  Differential includes the following, with pertinent life- or limb-threatening emergencies considered:  Recurrent  vasovagal syncope, orthostasis, cardiac arrhythmia/AV block, dysautonomia.   Patient's presentation is most consistent with acute presentation with potential threat to life or bodily function.  MDM:  61 yo M with PMHx COPD here with recurrent syncope. Syncopal episodes are highly c/w vasovagal episodes, and interestingly these correlate with worsening R neck pain that pt states has not been imaged yet. Work-up, imaging reviewed by me. CBC without significant leukocytosis. Renal function with mild elevation in Cr. CT angio head/neck reviewed and is concerning for possible enlarged lymph node vs soft tissue mass over the R internal carotid, which could theoretically exert mass effect on vagal nerve/sympathetic fibers and thus cause his recurrent syncope. He has a smoking history so ENT CA would be a concern. Discussed with Dr. Tami Ribas of ENT who can see pt, but suspect this will more so need radiology and additional imaging for further clarification. Pt updated and in agreement. Will admit for further work-up and monitoring.    MEDICATIONS GIVEN IN ED: Medications  albuterol (PROVENTIL) (2.5 MG/3ML) 0.083% nebulizer solution 2.5 mg (has no administration in time range)  allopurinol (ZYLOPRIM) tablet 300 mg (has no administration in time range)  amLODipine (NORVASC) tablet 5 mg (has no administration in time range)  aspirin EC tablet 81 mg (81 mg Oral Given 02/26/22 2335)  rosuvastatin (CRESTOR) tablet 20 mg (has no administration in time range)  pantoprazole  (PROTONIX) EC tablet 40 mg (has no administration in time range)  sodium chloride flush (NS) 0.9 % injection 3 mL (3 mLs Intravenous Given 02/27/22 0105)  0.9 %  sodium chloride infusion ( Intravenous New Bag/Given 02/27/22 0110)  acetaminophen (TYLENOL) tablet 650 mg (has no administration in time range)    Or  acetaminophen (TYLENOL) suppository 650 mg (has no administration in time range)  HYDROcodone-acetaminophen (NORCO/VICODIN) 5-325 MG per tablet 1 tablet (1 tablet Oral Given 02/26/22 2342)  morphine (PF) 2 MG/ML injection 2 mg (has no administration in time range)  nicotine (NICODERM CQ - dosed in mg/24 hours) patch 21 mg (21 mg Transdermal Patient Refused/Not Given 02/26/22 2335)  thiamine (VITAMIN B1) injection 100 mg (100 mg Intravenous Given 02/27/22 0106)  sodium chloride 0.9 % bolus 1,000 mL (0 mLs Intravenous Stopped 02/26/22 1632)  ondansetron (ZOFRAN) injection 4 mg (4 mg Intravenous Given 02/26/22 1535)  fentaNYL (SUBLIMAZE) injection 50 mcg (50 mcg Intravenous Given 02/26/22 1535)  ketorolac (TORADOL) 30 MG/ML injection 15 mg (15 mg Intravenous Given 02/26/22 1535)  iohexol (OMNIPAQUE) 350 MG/ML injection 75 mL (75 mLs Intravenous Contrast Given 02/26/22 1615)  dexamethasone (DECADRON) injection 10 mg (10 mg Intravenous Given 02/26/22 1823)  sodium chloride 0.9 % bolus 1,000 mL (0 mLs Intravenous Stopped 02/26/22 2208)     Consults:  Discussed case with Dr. Tami Ribas via telephone Hospitalist   EMR reviewed  Reviewed prior ED visits, Cardiology notes, and imaging     FINAL CLINICAL IMPRESSION(S) / ED DIAGNOSES   Final diagnoses:  None     Rx / DC Orders   ED Discharge Orders     None        Note:  This document was prepared using Dragon voice recognition software and may include unintentional dictation errors.   Duffy Bruce, MD 02/27/22 [phone]

## 2022-02-26 NOTE — H&P (Signed)
History and Physical    [name] OVF:[id number] DOB: 11-14-1960 DOA: 02/26/2022  PCP: Mechele Claude, FNP    Patient coming from:  Home    Chief Complaint:  Syncope    HPI:  Clarence Skinner is a 61 y.o. male seen in ed with complaints of : Pt coming for passing out . Pt was in courtroom , he felt nauseated, diaphoresis and had to vomit in courthouse. He went out and sat down and felt like he was passing out. Pt is poor historian.  This episode  duration unknown. Has been having similar episodes in past - last week similar episodes. Pt gets irate when providing details of episode.  First time it happened was in July 2023 when [facility] was [facility].  Pt has monitor for one week.  Pt has seen Dr.Agbor-Etang. - [facility].  Pt has not seen anybody else for this episode.  Pt denies any other symptoms except for neck pain started about for several years - left side.  ENT appt on next Wednesday for neck LAD. Pt has past medical history of tobacco abuse, alcohol use ? ,  COPD, GERD, hypertension, sleep apnea. HPI is difficult to obtain but patient does report melena intermittently when he drinks heavy and when asked about the last time he drank he says he does not remember. EDMD discussed with neurology on-call about imaging and recommendations and was told to obtain an MRI of the neck.  ED Course:   Vitals:   02/26/22 2200 02/26/22 2208 02/26/22 2237 02/26/22 2238  BP: 110/78   117/63  Pulse: (!) 56   62  Resp:      Temp:  98.8 F (37.1 C)  98.2 F (36.8 C)  TempSrc:  Oral  Oral  SpO2: 93%   94%  Weight:   78.7 kg   Height:   '5\' 9"'$  (1.753 m)   In the emergency room patient is alert awake oriented afebrile O2 sats of 98% on 2 L. On initial presentation patient was found to be hypotensive and bradycardic. EKG today shows sinus rhythm 77 with a PR of 147 and QTc of 457 with no ST-T wave changes. Blood work shows hemoglobin of 12.7  MCV of 75, hypokalemia with a potassium of 3.0 creatinine of 1.26 lactic acid of 3.2. CBC shows normal white count of 6.9 hemoglobin 12.7 and platelet counts of 316. >Neuroimaging shows: IMPRESSION: CT head:  1. No evidence of acute intracranial abnormality. 2. Mild patchy and ill-defined hypoattenuation within the cerebral white matter, nonspecific but most often secondary to chronic small vessel ischemia. 3. Mild paranasal sinus mucosal thickening.   CTA neck:  1. 4.3 x 2.5 cm ovoid soft tissue focus at the right level II/III stations (along the posterior aspect of the proximal ICA, carotid bifurcation and distal CCA). This likely reflects an enlarged lymph node. Given the size, this is concerning for nodal metastatic disease, although a reactive/inflammatory lymph node is possible. 2. Abnormal soft tissue surrounding the mid-to-distal cervical right ICA with resultant mild vessel narrowing. Findings are nonspecific, but may reflect a nodal mass or sequela of a vasculopathy. A contrast-enhanced neck CT is recommended for initial further evaluation of this finding, and for initial further evaluation of the finding described in impression #1. 3. Atherosclerotic plaque within the bilateral common carotid and cervical internal carotid arteries without hemodynamically significant stenosis. 4. Vertebral arteries patent within the neck. Mild-to-moderate atherosclerotic narrowing at the origin of  the right vertebral artery. No more than mild atherosclerotic stenosis at the origin of the left vertebral artery. 5. Aortic Atherosclerosis (ICD10-I70.0) and Emphysema (ICD10-J43.9). 6. Cervical spondylosis.   CTA head:  Intracranial atherosclerotic disease without intracranial large vessel occlusion or proximal high-grade arterial stenosis.   Review of Systems:  Review of Systems  All other systems reviewed and are negative.  Past Medical History:  Diagnosis Date   COPD (chronic  obstructive pulmonary disease) (HCC)    GERD (gastroesophageal reflux disease)    Gout    Hypertension    Multilevel degenerative disc disease    Pneumonia 11/29/2017   Sleep apnea    CPAP   Tachycardia    Wears dentures    partial upper and lower    Past Surgical History:  Procedure Laterality Date   ABCESS DRAINAGE  2009   tonsil   COLONOSCOPY WITH PROPOFOL N/A 02/11/2018   Procedure: COLONOSCOPY WITH PROPOFOL;  Surgeon: Toledo, Benay Pike, MD;  Location: [facility];  Service: Endoscopy;  Laterality: N/A;   ESOPHAGOGASTRODUODENOSCOPY (EGD) WITH PROPOFOL N/A 02/11/2018   Procedure: ESOPHAGOGASTRODUODENOSCOPY (EGD) WITH PROPOFOL;  Surgeon: Toledo, Benay Pike, MD;  Location: [facility];  Service: Endoscopy;  Laterality: N/A;   MASS EXCISION Right 08/14/2017   Procedure: EXCISION RIGHT TEMPORAL MASS SUBMENTAL MASS;  Surgeon: Carloyn Manner, MD;  Location: [place];  Service: ENT;  Laterality: Right;  sleep apnea   RADIOACTIVE SEED IMPLANT N/A 01/26/2019   Procedure: RADIOACTIVE SEED IMPLANT/BRACHYTHERAPY IMPLANT;  Surgeon: Billey Co, MD;  Location: [facility];  Service: Urology;  Laterality: N/A;   SEPTOPLASTY  2016   [place], [place]   VOLUME STUDY N/A 12/29/2018   Procedure: VOLUME STUDY;  Surgeon: Billey Co, MD;  Location: [facility];  Service: Urology;  Laterality: N/A;     reports that he has been smoking cigarettes. He has a 30.00 pack-year smoking history. He quit smokeless tobacco use about 37 years ago.  His smokeless tobacco use included snuff and chew. He reports current alcohol use of about 12.0 standard drinks of alcohol per week. He reports that he does not currently use drugs.  Allergies  Allergen Reactions   Chocolate Itching, Nausea Only and Shortness Of Breath    (large amounts) Other reaction(s): Nausea, Vomiting, Shortness of Breath / Dyspnea   Eggs Or Egg-Derived Products Itching, Nausea And Vomiting, Nausea Only and Shortness Of Breath    Milk (Cow) Shortness Of Breath   Benadryl [Diphenhydramine] Other (See Comments)    Altered mental status "makes me crazy and figety"   Milk-Related Compounds Nausea And Vomiting    Family History  Problem Relation Age of Onset   Anxiety disorder Mother    Hypertension Sister    Diabetes Sister    Other Brother        mva  at age 3   Asthma Sister    Gout Brother    Hypertension Brother     Prior to Admission medications   Medication Sig Start Date End Date Taking? Authorizing Provider  albuterol (PROVENTIL HFA;VENTOLIN HFA) 108 (90 Base) MCG/ACT inhaler Inhale into the lungs every 6 (six) hours as needed for wheezing or shortness of breath.    [provider]  allopurinol (ZYLOPRIM) 300 MG tablet Take 300 mg by mouth daily.    [provider]  amLODipine-benazepril (LOTREL) 10-40 MG capsule Take 1 capsule by mouth daily.    [provider]  aspirin EC 81 MG tablet Take 1 tablet (81 mg  total) by mouth daily. Swallow whole. 02/23/22   Kate Sable, MD  Azelastine HCl 137 MCG/SPRAY SOLN Place 1 spray into the nose daily as needed (allergies).     [provider]  ferrous sulfate 325 (65 FE) MG tablet Take 325 mg by mouth daily. 09/30/17   [provider]  fluticasone (FLONASE) 50 MCG/ACT nasal spray Place into both nostrils daily as needed for allergies or rhinitis.    [provider]  Fluticasone-Umeclidin-Vilant (TRELEGY ELLIPTA) 100-62.5-25 MCG/ACT AEPB Trelegy Ellipta 100-62.5-25 MCG/INH Inhalation Aerosol Powder Breath Activated QTY: 90 each Days: 90 Refills: 1  Written: 11/02/20 Patient Instructions: One puff once a day 11/02/20   [provider]  ibuprofen (ADVIL) 800 MG tablet SMARTSIG:1 Tablet(s) By Mouth Every 12 Hours PRN 12/07/21   [provider]  loratadine (CLARITIN) 10 MG tablet Take 10 mg by mouth daily. 12/07/21   [provider]  meloxicam (MOBIC) 7.5 MG tablet Take 7.5 mg by mouth daily  as needed for pain.    [provider]  pantoprazole (PROTONIX) 40 MG tablet Take 40 mg by mouth daily.    [provider]  rosuvastatin (CRESTOR) 20 MG tablet Take 1 tablet (20 mg total) by mouth daily. 02/23/22 05/25/23  Kate Sable, MD  tadalafil (CIALIS) 20 MG tablet Take 1 tablet (20 mg total) by mouth daily. 12/27/21   Billey Co, MD  Vitamin D, Ergocalciferol, (DRISDOL) 1.25 MG (50000 UNIT) CAPS capsule Take 50,000 Units by mouth once a week. 10/11/21   [provider]    Physical Exam: Vitals:   02/26/22 2200 02/26/22 2208 02/26/22 2237 02/26/22 2238  BP: 110/78   117/63  Pulse: (!) 56   62  Resp:      Temp:  98.8 F (37.1 C)  98.2 F (36.8 C)  TempSrc:  Oral  Oral  SpO2: 93%   94%  Weight:   78.7 kg   Height:   '5\' 9"'$  (1.753 m)    Physical Exam Vitals and nursing note reviewed.  Constitutional:      General: He is not in acute distress.    Appearance: Normal appearance. He is not ill-appearing, toxic-appearing or diaphoretic.  HENT:     Head: Normocephalic and atraumatic.     Right Ear: Hearing and external ear normal.     Left Ear: Hearing and external ear normal.     Nose: Nose normal. No nasal deformity.     Mouth/Throat:     Lips: Pink.     Mouth: Mucous membranes are moist.     Tongue: No lesions.     Pharynx: Oropharynx is clear.  Eyes:     Extraocular Movements: Extraocular movements intact.     Pupils: Pupils are equal, round, and reactive to light.  Neck:     Thyroid: No thyroid mass.     Vascular: No carotid bruit.   Cardiovascular:     Rate and Rhythm: Normal rate and regular rhythm.     Pulses: Normal pulses.     Heart sounds: Normal heart sounds.  Pulmonary:     Effort: Pulmonary effort is normal.     Breath sounds: Normal breath sounds.  Abdominal:     General: Bowel sounds are normal. There is no distension.     Palpations: Abdomen is soft. There is no mass.     Tenderness: There is no abdominal tenderness.  There is no guarding.     Hernia: No hernia is present.  Musculoskeletal:  Cervical back: No edema. No pain with movement.     Right lower leg: No edema.     Left lower leg: No edema.  Skin:    General: Skin is warm.  Neurological:     General: No focal deficit present.     Mental Status: He is alert and oriented to person, place, and time.     Cranial Nerves: Cranial nerves 2-12 are intact.     Motor: Motor function is intact.  Psychiatric:        Attention and Perception: Attention normal.        Mood and Affect: Mood normal.        Speech: Speech normal.        Behavior: Behavior normal. Behavior is cooperative.        Cognition and Memory: Cognition normal.      Labs on Admission: I have personally reviewed following labs and imaging studies BMET Recent Labs  Lab 02/26/22 1504  NA 138  K 3.3*  CL 108  CO2 20*  BUN 12  CREATININE 1.26*  GLUCOSE 90   Electrolytes Recent Labs  Lab 02/26/22 1504  CALCIUM 8.1*  MG 1.9   Sepsis Markers Recent Labs  Lab 02/26/22 1505 02/26/22 1700  LATICACIDVEN 3.2* 1.9   ABG No results for input(s): "PHART", "PCO2ART", "PO2ART" in the last 168 hours. Liver Enzymes Recent Labs  Lab 02/26/22 1504  AST 15  ALT 9  ALKPHOS 54  BILITOT 0.5  ALBUMIN 3.7   Cardiac Enzymes No results for input(s): "TROPONINI", "PROBNP" in the last 168 hours. No results found for: "DDIMER" Coag's No results for input(s): "APTT", "INR" in the last 168 hours.  No results found for this or any previous visit (from the past 240 hour(s)).   Current Facility-Administered Medications:    0.9 %  sodium chloride infusion, , Intravenous, Continuous, Para Skeans, MD, Last Rate: 50 mL/hr at 02/27/22 0110, New Bag at 02/27/22 0110   acetaminophen (TYLENOL) tablet 650 mg, 650 mg, Oral, Q6H PRN **OR** acetaminophen (TYLENOL) suppository 650 mg, 650 mg, Rectal, Q6H PRN, Para Skeans, MD   albuterol (PROVENTIL) (2.5 MG/3ML) 0.083% nebulizer solution  2.5 mg, 2.5 mg, Inhalation, Q6H PRN, Para Skeans, MD   allopurinol (ZYLOPRIM) tablet 300 mg, 300 mg, Oral, Daily, Florina Ou V, MD   aspirin EC tablet 81 mg, 81 mg, Oral, Daily, Florina Ou V, MD, 81 mg at 02/26/22 2335   heparin injection 5,000 Units, 5,000 Units, Subcutaneous, Q8H, Rodrick Payson V, MD   morphine (PF) 2 MG/ML injection 2 mg, 2 mg, Intravenous, Q4H PRN, Para Skeans, MD   nicotine (NICODERM CQ - dosed in mg/24 hours) patch 21 mg, 21 mg, Transdermal, Daily, Edward Trevino V, MD   pantoprazole (PROTONIX) EC tablet 40 mg, 40 mg, Oral, Daily, Addi Pak V, MD   rosuvastatin (CRESTOR) tablet 20 mg, 20 mg, Oral, Daily, Carolyn Sylvia V, MD   sodium chloride flush (NS) 0.9 % injection 3 mL, 3 mL, Intravenous, Q12H, Florina Ou V, MD, 3 mL at 02/27/22 0105   thiamine (VITAMIN B1) injection 100 mg, 100 mg, Intravenous, Daily, Florina Ou V, MD, 100 mg at 02/27/22 0106  COVID-19 Labs No results for input(s): "DDIMER", "FERRITIN", "LDH", "CRP" in the last 72 hours. Lab Results  Component Value Date   [place] NEGATIVE 01/22/2019   SARSCOV2NAA NOT DETECTED 12/25/2018    Radiological Exams on Admission: CT ANGIO HEAD NECK W WO CM  Result Date: 02/26/2022  CLINICAL DATA:  Provided history: Dizziness, persistent/recurrent, cardiac or vascular cause suspected; recurrent syncope, neck pain, severe. EXAM: CT ANGIOGRAPHY HEAD AND NECK TECHNIQUE: Multidetector CT imaging of the head and neck was performed using the standard protocol during bolus administration of intravenous contrast. Multiplanar CT image reconstructions and MIPs were obtained to evaluate the vascular anatomy. Carotid stenosis measurements (when applicable) are obtained utilizing NASCET criteria, using the distal internal carotid diameter as the denominator. RADIATION DOSE REDUCTION: This exam was performed according to the departmental dose-optimization program which includes automated exposure control, adjustment of the mA and/or  kV according to patient size and/or use of iterative reconstruction technique. CONTRAST:  52m OMNIPAQUE IOHEXOL 350 MG/ML SOLN COMPARISON:  Head CT 06/18/2011. Report from brain MRI 05/12/1996 (images unavailable). FINDINGS: CT HEAD FINDINGS Brain: Mild generalized parenchymal atrophy. Mild patchy and ill-defined hypoattenuation within the cerebral white matter, nonspecific but most often secondary to chronic small vessel ischemia. There is no acute intracranial hemorrhage. No demarcated cortical infarct. No extra-axial fluid collection. No evidence of an intracranial mass. No midline shift. Vascular: No hyperdense vessel. Atherosclerotic calcifications. Skull: No fracture or aggressive osseous lesion. Sinuses/Orbits: No orbital mass or acute orbital finding. Mild mucosal thickening within the right frontal, bilateral ethmoid and bilateral maxillary sinuses. Review of the MIP images confirms the above findings CTA NECK FINDINGS Aortic arch: Standard aortic branching. Atherosclerotic plaque within the visualized aortic arch and proximal major branch vessels of the neck. Streak and beam hardening artifact arising from a dense left-sided contrast bolus partially obscures the left subclavian artery. Within this limitation, there is no appreciable hemodynamically significant innominate or proximal subclavian artery stenosis. Right carotid system: CCA and ICA patent within the neck. Atherosclerotic plaque within the proximal ICA, resulting in less than 50% stenosis. There is abnormal soft tissue surrounding the mid-to-distal cervical ICA with associated mild narrowing of the ICA at these levels. Left carotid system: CCA and ICA patent within the neck without stenosis. Mild atherosclerotic plaque at the CCA origin and about the carotid bifurcation. Vertebral arteries: Vertebral arteries patent within the neck. Mild to moderate atherosclerotic narrowing at the origin of the right vertebral artery. Calcified plaque at the  origin of the left vertebral artery with no more than mild stenosis. Skeleton: Reversal of the expected cervical lordosis. Cervical spondylosis. No acute fracture or aggressive osseous lesion. Other neck: 4.3 x 2.5 cm ovoid soft tissue focus at the right level II/level III stations (along the posterior aspect of the proximal ICA, carotid bifurcation and distal CCA). This likely reflects an enlarged lymph node (for instance as seen on series 10, image 223) (series 13, image 90). Subcentimeter thyroid nodules, not meeting consensus criteria for ultrasound follow-up based on size. Reference: J Am Coll Radiol. 2015 Feb;12(2): 143-50. Upper chest: Centrilobular and paraseptal emphysema. No consolidation within the imaged lung apices. Review of the MIP images confirms the above findings CTA HEAD FINDINGS Anterior circulation: The intracranial internal carotid arteries are patent. The M1 middle cerebral arteries are patent. No M2 proximal branch occlusion or high-grade proximal stenosis is identified. Atherosclerotic irregularity of the M2 and more distal MCA vessels, bilaterally. The anterior cerebral arteries are patent. No intracranial aneurysm is identified. Posterior circulation: The intracranial vertebral arteries are patent. The basilar artery is patent. The posterior cerebral arteries are patent. A left posterior communicating artery is present. The right posterior communicating artery is diminutive or absent. Venous sinuses: Within the limitations of contrast timing, no convincing thrombus. Anatomic variants: As described. Review of the  MIP images confirms the above findings IMPRESSION: CT head: 1. No evidence of acute intracranial abnormality. 2. Mild patchy and ill-defined hypoattenuation within the cerebral white matter, nonspecific but most often secondary to chronic small vessel ischemia. 3. Mild paranasal sinus mucosal thickening. CTA neck: 1. 4.3 x 2.5 cm ovoid soft tissue focus at the right level II/III  stations (along the posterior aspect of the proximal ICA, carotid bifurcation and distal CCA). This likely reflects an enlarged lymph node. Given the size, this is concerning for nodal metastatic disease, although a reactive/inflammatory lymph node is possible. 2. Abnormal soft tissue surrounding the mid-to-distal cervical right ICA with resultant mild vessel narrowing. Findings are nonspecific, but may reflect a nodal mass or sequela of a vasculopathy. A contrast-enhanced neck CT is recommended for initial further evaluation of this finding, and for initial further evaluation of the finding described in impression #1. 3. Atherosclerotic plaque within the bilateral common carotid and cervical internal carotid arteries without hemodynamically significant stenosis. 4. Vertebral arteries patent within the neck. Mild-to-moderate atherosclerotic narrowing at the origin of the right vertebral artery. No more than mild atherosclerotic stenosis at the origin of the left vertebral artery. 5. Aortic Atherosclerosis (ICD10-I70.0) and Emphysema (ICD10-J43.9). 6. Cervical spondylosis. CTA head: Intracranial atherosclerotic disease without intracranial large vessel occlusion or proximal high-grade arterial stenosis. Electronically Signed   By: Kellie Simmering D.O.   On: 02/26/2022 17:13   DG Chest Portable 1 View  Result Date: 02/26/2022 CLINICAL DATA:  Syncope EXAM: PORTABLE CHEST 1 VIEW COMPARISON:  February 13, 2022, April 07, 2018 FINDINGS: The heart size and mediastinal contours are within normal limits. Both lungs are clear. The visualized skeletal structures are unremarkable. IMPRESSION: No acute cardiopulmonary abnormality. Electronically Signed   By: Beryle Flock M.D.   On: 02/26/2022 15:37    EKG: Independently reviewed.  SR and same a before from august.    Assessment and Plan: * Syncope Unclear if this syncope passing out along with hypotension and bradycardia is secondary to cardiovascular issue.Or  neurological issue .[name] Neurochecks. MRI of the brain noncontrast. 2D echocardiogram with bubble study. Carotid Dopplers.    Anemia    Latest Ref Rng & Units 02/26/2022    3:04 PM 02/13/2022    8:35 PM 01/09/2018   10:58 AM  CBC  WBC 4.0 - 10.5 K/uL 6.9  8.1  9.7   Hemoglobin 13.0 - 17.0 g/dL 12.7  12.2  12.6   Hematocrit 39.0 - 52.0 % 39.2  38.1  38.5   Platelets 150 - 400 K/uL 316  260  354   Patient has mild anemia, and does take NSAIDs. Not certain if there is a component of blood loss or chronic blood loss anemia. We will obtain stool occult type and screen IV PPIs.   Hypotension Vitals:   02/26/22 1438 02/26/22 1529 02/26/22 1606 02/26/22 1714  BP: 104/63 (!) 117/95 108/70 110/72   02/26/22 1824 02/26/22 1900 02/26/22 1930 02/26/22 2000  BP: 115/65 111/71 123/78 122/70   02/26/22 2030 02/26/22 2100 02/26/22 2200 02/26/22 2238  BP: 129/76 125/73 110/78 117/63    Intake/Output Summary (Last 24 hours) at 02/27/2022 0248 Last data filed at 02/26/2022 1632 Gross per 24 hour  Intake 1000 ml  Output --  Net 1000 ml  we will monitor.    Sinus bradycardia Resolved. 2 d echo. Cardiology consult per am team.   Essential hypertension Vitals:   02/26/22 1438 02/26/22 1529 02/26/22 1606 02/26/22 1714  BP: 104/63 (!) 117/95 108/70  110/72   02/26/22 1824 02/26/22 1900 02/26/22 1930 02/26/22 2000  BP: 115/65 111/71 123/78 122/70   02/26/22 2030 02/26/22 2100 02/26/22 2200 02/26/22 2238  BP: 129/76 125/73 110/78 117/63  we will hold amlodipine.    Melena Intermittent melena per patient history with mild anemia, type and cross, IV PPI. GI consult per a.m. team.   Neck pain MRI soft tissue neck with contrast.   Chronic obstructive pulmonary disease (HCC) Stable, as needed albuterol.   Obstructive sleep apnea CPAP per home settings.    DVT prophylaxis:  Heparin   Code Status:  Full code    Family Communication:  Crawford Givens (Significant other)   [phone] (Mobile)   Disposition Plan:  Home    Consults called:  None   Admission status: Inpatient.     Para Skeans MD [facility]  6 PM- 2 AM. Please contact me via secure Chat 6 PM-2 AM. [phone] ( Pager ) To contact the [facility] Attending or Consulting provider [place] or covering provider during after hours [place], for this patient.   Check the care team in [facility] and look for a) attending/consulting [facility] provider listed and b) the [facility] team listed Log into [email or web address] and use [place]'s universal password to access. If you do not have the password, please contact the hospital operator. Locate the [facility] provider you are looking for under [facility] and page to a number that you can be directly reached. If you still have difficulty reaching the provider, please page the [facility] (Director on Call) for the Hospitalists listed on amion for assistance. [email or web address] 02/27/2022, 2:53 AM

## 2022-02-27 ENCOUNTER — Observation Stay: Payer: Medicaid Other

## 2022-02-27 DIAGNOSIS — C770 Secondary and unspecified malignant neoplasm of lymph nodes of head, face and neck: Secondary | ICD-10-CM | POA: Diagnosis not present

## 2022-02-27 DIAGNOSIS — E663 Overweight: Secondary | ICD-10-CM | POA: Diagnosis not present

## 2022-02-27 DIAGNOSIS — R590 Localized enlarged lymph nodes: Secondary | ICD-10-CM | POA: Diagnosis not present

## 2022-02-27 DIAGNOSIS — F17210 Nicotine dependence, cigarettes, uncomplicated: Secondary | ICD-10-CM | POA: Diagnosis not present

## 2022-02-27 DIAGNOSIS — M47812 Spondylosis without myelopathy or radiculopathy, cervical region: Secondary | ICD-10-CM | POA: Diagnosis not present

## 2022-02-27 DIAGNOSIS — I6523 Occlusion and stenosis of bilateral carotid arteries: Secondary | ICD-10-CM | POA: Diagnosis not present

## 2022-02-27 DIAGNOSIS — I10 Essential (primary) hypertension: Secondary | ICD-10-CM | POA: Diagnosis not present

## 2022-02-27 DIAGNOSIS — E876 Hypokalemia: Secondary | ICD-10-CM | POA: Diagnosis not present

## 2022-02-27 DIAGNOSIS — G4733 Obstructive sleep apnea (adult) (pediatric): Secondary | ICD-10-CM | POA: Diagnosis not present

## 2022-02-27 DIAGNOSIS — G9001 Carotid sinus syncope: Secondary | ICD-10-CM | POA: Diagnosis not present

## 2022-02-27 DIAGNOSIS — I959 Hypotension, unspecified: Secondary | ICD-10-CM | POA: Diagnosis not present

## 2022-02-27 DIAGNOSIS — R42 Dizziness and giddiness: Secondary | ICD-10-CM | POA: Diagnosis not present

## 2022-02-27 DIAGNOSIS — R221 Localized swelling, mass and lump, neck: Secondary | ICD-10-CM | POA: Diagnosis not present

## 2022-02-27 DIAGNOSIS — R112 Nausea with vomiting, unspecified: Secondary | ICD-10-CM | POA: Diagnosis present

## 2022-02-27 DIAGNOSIS — Z833 Family history of diabetes mellitus: Secondary | ICD-10-CM | POA: Diagnosis not present

## 2022-02-27 DIAGNOSIS — D649 Anemia, unspecified: Secondary | ICD-10-CM | POA: Diagnosis present

## 2022-02-27 DIAGNOSIS — Z8546 Personal history of malignant neoplasm of prostate: Secondary | ICD-10-CM | POA: Diagnosis not present

## 2022-02-27 DIAGNOSIS — Z8249 Family history of ischemic heart disease and other diseases of the circulatory system: Secondary | ICD-10-CM | POA: Diagnosis not present

## 2022-02-27 DIAGNOSIS — M542 Cervicalgia: Secondary | ICD-10-CM | POA: Diagnosis not present

## 2022-02-27 DIAGNOSIS — C099 Malignant neoplasm of tonsil, unspecified: Secondary | ICD-10-CM | POA: Diagnosis not present

## 2022-02-27 DIAGNOSIS — Z818 Family history of other mental and behavioral disorders: Secondary | ICD-10-CM | POA: Diagnosis not present

## 2022-02-27 DIAGNOSIS — Z79899 Other long term (current) drug therapy: Secondary | ICD-10-CM | POA: Diagnosis not present

## 2022-02-27 DIAGNOSIS — Z888 Allergy status to other drugs, medicaments and biological substances status: Secondary | ICD-10-CM | POA: Diagnosis not present

## 2022-02-27 DIAGNOSIS — J449 Chronic obstructive pulmonary disease, unspecified: Secondary | ICD-10-CM | POA: Diagnosis not present

## 2022-02-27 DIAGNOSIS — J3489 Other specified disorders of nose and nasal sinuses: Secondary | ICD-10-CM | POA: Diagnosis not present

## 2022-02-27 DIAGNOSIS — R55 Syncope and collapse: Secondary | ICD-10-CM | POA: Diagnosis not present

## 2022-02-27 DIAGNOSIS — F17200 Nicotine dependence, unspecified, uncomplicated: Secondary | ICD-10-CM | POA: Diagnosis not present

## 2022-02-27 DIAGNOSIS — D509 Iron deficiency anemia, unspecified: Secondary | ICD-10-CM | POA: Diagnosis not present

## 2022-02-27 DIAGNOSIS — Z7982 Long term (current) use of aspirin: Secondary | ICD-10-CM | POA: Diagnosis not present

## 2022-02-27 DIAGNOSIS — Z825 Family history of asthma and other chronic lower respiratory diseases: Secondary | ICD-10-CM | POA: Diagnosis not present

## 2022-02-27 DIAGNOSIS — Z7951 Long term (current) use of inhaled steroids: Secondary | ICD-10-CM | POA: Diagnosis not present

## 2022-02-27 DIAGNOSIS — M109 Gout, unspecified: Secondary | ICD-10-CM | POA: Diagnosis not present

## 2022-02-27 DIAGNOSIS — R001 Bradycardia, unspecified: Secondary | ICD-10-CM | POA: Diagnosis not present

## 2022-02-27 DIAGNOSIS — K219 Gastro-esophageal reflux disease without esophagitis: Secondary | ICD-10-CM | POA: Diagnosis not present

## 2022-02-27 DIAGNOSIS — I672 Cerebral atherosclerosis: Secondary | ICD-10-CM | POA: Diagnosis not present

## 2022-02-27 DIAGNOSIS — J351 Hypertrophy of tonsils: Secondary | ICD-10-CM | POA: Diagnosis not present

## 2022-02-27 DIAGNOSIS — J358 Other chronic diseases of tonsils and adenoids: Secondary | ICD-10-CM

## 2022-02-27 DIAGNOSIS — C090 Malignant neoplasm of tonsillar fossa: Secondary | ICD-10-CM | POA: Diagnosis not present

## 2022-02-27 LAB — URINALYSIS, COMPLETE (UACMP) WITH MICROSCOPIC
Bacteria, UA: NONE SEEN
Bilirubin Urine: NEGATIVE
Glucose, UA: NEGATIVE mg/dL
Ketones, ur: NEGATIVE mg/dL
Leukocytes,Ua: NEGATIVE
Nitrite: NEGATIVE
Protein, ur: NEGATIVE mg/dL
Specific Gravity, Urine: 1.027 (ref 1.005–1.030)
Squamous Epithelial / HPF: NONE SEEN (ref 0–5)
pH: 5 (ref 5.0–8.0)

## 2022-02-27 LAB — CBC
HCT: 37.9 % — ABNORMAL LOW (ref 39.0–52.0)
Hemoglobin: 12.1 g/dL — ABNORMAL LOW (ref 13.0–17.0)
MCH: 24.1 pg — ABNORMAL LOW (ref 26.0–34.0)
MCHC: 31.9 g/dL (ref 30.0–36.0)
MCV: 75.5 fL — ABNORMAL LOW (ref 80.0–100.0)
Platelets: 263 10*3/uL (ref 150–400)
RBC: 5.02 MIL/uL (ref 4.22–5.81)
RDW: 15 % (ref 11.5–15.5)
WBC: 6.4 10*3/uL (ref 4.0–10.5)
nRBC: 0 % (ref 0.0–0.2)

## 2022-02-27 LAB — URINE DRUG SCREEN, QUALITATIVE (ARMC ONLY)
Amphetamines, Ur Screen: NOT DETECTED
Barbiturates, Ur Screen: NOT DETECTED
Benzodiazepine, Ur Scrn: NOT DETECTED
Cannabinoid 50 Ng, Ur ~~LOC~~: NOT DETECTED
Cocaine Metabolite,Ur ~~LOC~~: NOT DETECTED
MDMA (Ecstasy)Ur Screen: NOT DETECTED
Methadone Scn, Ur: NOT DETECTED
Opiate, Ur Screen: NOT DETECTED
Phencyclidine (PCP) Ur S: NOT DETECTED
Tricyclic, Ur Screen: NOT DETECTED

## 2022-02-27 LAB — COMPREHENSIVE METABOLIC PANEL
ALT: 10 U/L (ref 0–44)
AST: 11 U/L — ABNORMAL LOW (ref 15–41)
Albumin: 3.4 g/dL — ABNORMAL LOW (ref 3.5–5.0)
Alkaline Phosphatase: 56 U/L (ref 38–126)
Anion gap: 6 (ref 5–15)
BUN: 11 mg/dL (ref 6–20)
CO2: 19 mmol/L — ABNORMAL LOW (ref 22–32)
Calcium: 7.6 mg/dL — ABNORMAL LOW (ref 8.9–10.3)
Chloride: 112 mmol/L — ABNORMAL HIGH (ref 98–111)
Creatinine, Ser: 0.87 mg/dL (ref 0.61–1.24)
GFR, Estimated: >60 mL/min (ref >60)
Glucose, Bld: 111 mg/dL — ABNORMAL HIGH (ref 70–99)
Potassium: 4 mmol/L (ref 3.5–5.1)
Sodium: 137 mmol/L (ref 135–145)
Total Bilirubin: 0.5 mg/dL (ref 0.3–1.2)
Total Protein: 6.1 g/dL — ABNORMAL LOW (ref 6.5–8.1)

## 2022-02-27 LAB — ETHANOL: Alcohol, Ethyl (B): <10 mg/dL (ref <10)

## 2022-02-27 LAB — HIV ANTIBODY (ROUTINE TESTING W REFLEX): HIV Screen 4th Generation wRfx: NONREACTIVE

## 2022-02-27 LAB — TYPE AND SCREEN
ABO/RH(D): O POS
Antibody Screen: NEGATIVE

## 2022-02-27 MED ORDER — GADOBUTROL 1 MMOL/ML IV SOLN
7.0000 mL | Freq: Once | INTRAVENOUS | Status: AC | PRN
  Administered 2022-02-27: 7 mL via INTRAVENOUS

## 2022-02-27 MED ORDER — HEPARIN SODIUM (PORCINE) 5000 UNIT/ML IJ SOLN
5000.0000 [IU] | Freq: Three times a day (TID) | INTRAMUSCULAR | Status: DC
  Administered 2022-02-27 – 2022-03-01 (×7): 5000 [IU] via SUBCUTANEOUS
  Filled 2022-02-27 (×7): qty 1

## 2022-02-27 NOTE — Assessment & Plan Note (Addendum)
Vitals:   02/26/22 1438 02/26/22 1529 02/26/22 1606 02/26/22 1714  BP: 104/63 (!) 117/95 108/70 110/72   02/26/22 1824 02/26/22 1900 02/26/22 1930 02/26/22 2000  BP: 115/65 111/71 123/78 122/70   02/26/22 2030 02/26/22 2100 02/26/22 2200 02/26/22 2238  BP: 129/76 125/73 110/78 117/63  we will hold amlodipine.

## 2022-02-27 NOTE — Progress Notes (Signed)
02/27/2022 at 0653:  Routine MRI of brain and neck ordered by Florina Ou, MD around 0300. Pt transported to MRI at 0600. MRI tech contacted this RN and stated that MRI can not be performed d/t cardiac monitor. Charge RN consulted by this RN on whether cardiac monitor should be removed for scan or should cardiac monitor stay in place. MRI tech advised by Charge RN and this RN to keep monitor in place. Pt decides to remove cardiac monitor and states that he "has the number to cardiology. We can call and have a new one sent over." Pt currently has [employer] cardiac monitor. Monitor placed by cardiology on 02/23/2022. Per cardiology note, monitor is to stay in place for 7 days. This RN will relay the previously stated to day shift RN.

## 2022-02-27 NOTE — Assessment & Plan Note (Addendum)
Secondary to hypotension and bradycardia.  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

## 2022-02-27 NOTE — Assessment & Plan Note (Addendum)
Patient has had a formal diagnosis and sleep study.  He has CPAP at home, but has not been able to get the machine to work properly, as he states the CPAP machine at home makes him cough.  Setting up CPAP here.  Asking for referral for pulmonary as outpatient

## 2022-02-27 NOTE — Assessment & Plan Note (Signed)
Resolved. 2 d echo. Cardiology consult per am team.

## 2022-02-27 NOTE — Assessment & Plan Note (Signed)
Intermittent melena per patient history with mild anemia, type and cross, IV PPI. GI consult per a.m. team.

## 2022-02-27 NOTE — Assessment & Plan Note (Signed)
MRI soft tissue neck with contrast.

## 2022-02-27 NOTE — Consult Note (Signed)
Clarence Skinner, Clarence Skinner [id number] January 20, 1961 Annita Brod, MD  Reason for Consult: Evaluate for possible right tonsil cancer  HPI: Patient is a 61 year old African-American male who had a syncopal episode and came to the emergency room for further evaluation.  He has complained about some soreness in his right throat area for the last month that has been slowly getting worse.  He is also complaining of some neck pain now on the right side and can feel a mass in his right neck.  He was seen by cardiology a week ago and a workup of his heart rhythms was undertaken to look for potential sources of syncope.  He had a CT angiography of his neck which showed no significant occlusion but a mass just behind the carotid bifurcation pushing it forward.  A dedicated MRI scan of the neck was then done which showed 2 masses in the right neck, 1 behind the carotid and the second 1 at the right skull base.  There is also enlargement of the right tonsil suggestive of cancer here with possible right neck mets.  The patient is able to swallow some but is getting more difficult.  He is not short of breath or been hoarse at all.  He is not had any sign of infection in his throat or coughed up any blood.  Allergies:  Allergies  Allergen Reactions   Chocolate Itching, Nausea Only and Shortness Of Breath    (large amounts) Other reaction(s): Nausea, Vomiting, Shortness of Breath / Dyspnea   Eggs Or Egg-Derived Products Itching, Nausea And Vomiting, Nausea Only and Shortness Of Breath   Milk (Cow) Shortness Of Breath   Benadryl [Diphenhydramine] Other (See Comments)    Altered mental status "makes me crazy and figety"   Milk-Related Compounds Nausea And Vomiting    ROS: Review of systems normal other than 12 systems except per HPI.  PMH:  Past Medical History:  Diagnosis Date   COPD (chronic obstructive pulmonary disease) (HCC)    GERD (gastroesophageal reflux disease)    Gout    Hypertension    Multilevel  degenerative disc disease    Pneumonia 11/29/2017   Sleep apnea    CPAP   Tachycardia    Wears dentures    partial upper and lower    FH:  Family History  Problem Relation Age of Onset   Anxiety disorder Mother    Hypertension Sister    Diabetes Sister    Other Brother        mva  at age 14   Asthma Sister    Gout Brother    Hypertension Brother     SH:  Social History   Socioeconomic History   Marital status: Significant Other    Spouse name: Not on file   Number of children: Not on file   Years of education: Not on file   Highest education level: Not on file  Occupational History   Not on file  Tobacco Use   Smoking status: Every Day    Packs/day: 1.00    Years: 30.00    Total pack years: 30.00    Types: Cigarettes   Smokeless tobacco: Former    Types: Snuff, Chew    Quit date: 12/05/1984   Tobacco comments:    since age 36  Vaping Use   Vaping Use: Never used  Substance and Sexual Activity   Alcohol use: Yes    Alcohol/week: 12.0 standard drinks of alcohol    Types: 12 Cans of beer  per week    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   Drug use: Not Currently   Sexual activity: Yes  Other Topics Concern   Not on file  Social History Narrative   Not on file   Social Determinants of Health   Financial Resource Strain: Not on file  Food Insecurity: Not on file  Transportation Needs: Not on file  Physical Activity: Not on file  Stress: Not on file  Social Connections: Not on file  Intimate Partner Violence: Not on file    PSH:  Past Surgical History:  Procedure Laterality Date   ABCESS DRAINAGE  2009   tonsil   COLONOSCOPY WITH PROPOFOL N/A 02/11/2018   Procedure: COLONOSCOPY WITH PROPOFOL;  Surgeon: Toledo, Benay Pike, MD;  Location: [facility];  Service: Endoscopy;  Laterality: N/A;   ESOPHAGOGASTRODUODENOSCOPY (EGD) WITH PROPOFOL N/A 02/11/2018   Procedure: ESOPHAGOGASTRODUODENOSCOPY (EGD) WITH PROPOFOL;  Surgeon: Toledo,  Benay Pike, MD;  Location: [facility];  Service: Endoscopy;  Laterality: N/A;   MASS EXCISION Right 08/14/2017   Procedure: EXCISION RIGHT TEMPORAL MASS SUBMENTAL MASS;  Surgeon: Carloyn Manner, MD;  Location: [place];  Service: ENT;  Laterality: Right;  sleep apnea   RADIOACTIVE SEED IMPLANT N/A 01/26/2019   Procedure: RADIOACTIVE SEED IMPLANT/BRACHYTHERAPY IMPLANT;  Surgeon: Clarence Skinner Co, MD;  Location: [facility];  Service: Urology;  Laterality: N/A;   SEPTOPLASTY  2016   [place], [place]   VOLUME STUDY N/A 12/29/2018   Procedure: VOLUME STUDY;  Surgeon: Clarence Skinner Co, MD;  Location: [facility];  Service: Urology;  Laterality: N/A;    Physical  Exam:'s exam shows on his oropharynx his uvula is midline and his palate is not swollen at all.  His right tonsil is 4+ and near touching the midline although it is not red or inflamed.  There is no ulcerations that I can see or lesions on the surface of the tonsil.  The left tonsil is 1+ and not inflamed at all.  He has a small pocket in his left side that suggest tonsillar lithiasis.  His tongue is mobile and not tender at all in his floor mouth and oropharynx is otherwise clear.His right neck has a 2 cm tender node just lateral/posterior to the carotid bulb. No other nodes palpable on either side.   I reviewed the MRI scan and CTA of neck directly.    A/P: He has a very enlarged right tonsil that on MRI appears to have a mass inside it, altho it appears normal, but big, on exam. No obvious cancer to biopsy, without removing the entire tonsil. It seems it would be much simpler to do an U/S guided FNA of right neck node that is easily palpable to get a diagnosis, which can direct treatment. This is advanced cancer, and if surgery is considered it would need to be done at the medical center because of the skull base node. If possible, chemo may shrink this well enough to consider chemo /radiation to control. Hopefully FNA can be set up while  he is here in the hospital.    Elon Alas [facility] 02/27/2022 6:28 PM

## 2022-02-27 NOTE — Assessment & Plan Note (Signed)
Vitals:   02/26/22 1438 02/26/22 1529 02/26/22 1606 02/26/22 1714  BP: 104/63 (!) 117/95 108/70 110/72   02/26/22 1824 02/26/22 1900 02/26/22 1930 02/26/22 2000  BP: 115/65 111/71 123/78 122/70   02/26/22 2030 02/26/22 2100 02/26/22 2200 02/26/22 2238  BP: 129/76 125/73 110/78 117/63    Intake/Output Summary (Last 24 hours) at 02/27/2022 0248 Last data filed at 02/26/2022 1632 Gross per 24 hour  Intake 1000 ml  Output --  Net 1000 ml  we will monitor.

## 2022-02-27 NOTE — Assessment & Plan Note (Addendum)
Noted microcytic anemia.  Patient has had a previous colonoscopy.  Suspect this may be related to chronic disease, perhaps his tonsillar carcinoma.  Iron studies pending.

## 2022-02-27 NOTE — Progress Notes (Signed)
[facility] Progress Note  Patient: Clarence Skinner    BHA:[id number]  DOA: 02/26/2022    Date of Service: the patient was seen and examined on 02/27/2022  Brief hospital course: 61 year old male with past medical history of COPD, obstructive sleep apnea and hypertension presented to the emergency room on 8/7 after a syncopal event.  Patient has had a number of episodes over the past month and was seen by cardiology as outpatient on 8/4 and at that time, had cardiac monitor placed.  In the emergency room, lab work noteworthy for a lactic acid level of 3.2, hemoglobin of 12.7 with an MCV of 75 and other work-up revealed CT scan of the head noted a mild patchy and ill-defined hypoattenuation within the cerebral white matter nonspecific but often secondary to chronic small vessel ischemia and CTA of the neck which noted a 4 x 2.5 soft tissue focus felt to be an enlarged lymph node.  Patient brought in for further work-up.   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  ENT consulted and after evaluation of patient, recommended ultrasound-guided biopsy by interventional radiology.  It is suspected that metastases by carotid may be causing his bradycardia/syncope.  Assessment and Plan: Assessment and Plan: * Syncope Secondary to hypotension and bradycardia.  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   Tonsillar mass As above.  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  ENT consulted and recommended ultrasound-guided biopsy by interventional radiology  Essential hypertension Vitals:   02/26/22 1438 02/26/22  1529 02/26/22 1606 02/26/22 1714  BP: 104/63 (!) 117/95 108/70 110/72   02/26/22 1824 02/26/22 1900 02/26/22 1930 02/26/22 2000  BP: 115/65 111/71 123/78 122/70   02/26/22 2030 02/26/22 2100 02/26/22 2200 02/26/22 2238  BP: 129/76 125/73 110/78 117/63  we will hold amlodipine.    Obstructive sleep apnea Patient has had a formal diagnosis and sleep study.  He has CPAP at home, but has not been able to get the machine to work properly, as he states the CPAP machine at home makes him cough.  Setting up CPAP here.  Asking for referral for pulmonary as outpatient  Anemia Noted microcytic anemia.  Patient has had a previous colonoscopy.  Suspect this may be related to chronic disease, perhaps his tonsillar carcinoma.  Iron studies pending.  Chronic obstructive pulmonary disease (HCC) Stable, as needed albuterol.   Overweight (BMI 25.0-29.9) Patient denies any issues with weight loss.  Meets criteria BMI greater than 25.       Body mass index is 26.76 kg/m.        Consultants: ENT Case discussed with neurology Interventional radiology  Procedures: IR consulted for neck node biopsy Echocardiogram: Results pending  Antimicrobials: None  Code Status: Full code   Subjective: Patient complains of being a little tired.  Denies any dizziness or lightheadedness currently.  Objective: Vital signs were reviewed and unremarkable. Vitals:   02/27/22 0832 02/27/22 1622  BP: 116/64 125/68  Pulse: (!) 54 64  Resp: 18 18  Temp: 98.6 F (37 C) 98.5 F (36.9 C)  SpO2: 92% 92%    Intake/Output Summary (Last 24 hours) at 02/27/2022  [place] filed at 02/27/2022 1649 Gross per 24 hour  Intake 658.39 ml  Output 1100 ml  Net -441.61 ml   Filed Weights   02/26/22 1434 02/26/22 2237 02/27/22 0355  Weight: 85 kg 78.7 kg 82.2 kg   Body mass index is 26.76 kg/m.  Exam:  General: Alert and oriented x3, no acute distress HEENT: Normocephalic, atraumatic, mucous membranes  are slightly dry.  Palpable node lateral to right ICA Cardiovascular: Regular rhythm, borderline bradycardia Respiratory: Clear to auscultation bilateral Abdomen: Soft, nontender, nondistended, positive bowel sounds Musculoskeletal: No clubbing or cyanosis or Skin: No skin breaks, tears or Psychiatry: Appropriate, no evidence of psychoses Neurology: No focal deficits  Data Reviewed: Noted hemoglobin of 12 with MCV of 75  Disposition:  Status is: Inpatient Remains inpatient appropriate because: Work-up for tonsillar mass    Anticipated discharge date: Work-up for tonsillar mass  Family Communication: Declined for me to call anyone DVT Prophylaxis: heparin injection 5,000 Units Start: 02/27/22 0600 SCDs Start: 02/26/22 2106    Author: Annita Brod ,MD 02/27/2022 7:17 PM  To reach On-call, see care teams to locate the attending and reach out via [email or web address]. Between 7PM-7AM, please contact night-coverage If you still have difficulty reaching the attending provider, please page the [facility] (Director on Call) for [facility] on amion for assistance.

## 2022-02-27 NOTE — Assessment & Plan Note (Signed)
Patient denies any issues with weight loss.  Meets criteria BMI greater than 25.

## 2022-02-27 NOTE — Assessment & Plan Note (Signed)
As above.  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  ENT consulted and recommended ultrasound-guided biopsy by interventional radiology

## 2022-02-27 NOTE — Assessment & Plan Note (Signed)
Stable, as needed albuterol.

## 2022-02-28 ENCOUNTER — Inpatient Hospital Stay: Payer: Medicaid Other

## 2022-02-28 ENCOUNTER — Inpatient Hospital Stay
Admit: 2022-02-28 | Discharge: 2022-02-28 | Disposition: A | Discharge status: Home / Self Care | Payer: Medicaid Other | Attending: Internal Medicine | Admitting: Internal Medicine

## 2022-02-28 DIAGNOSIS — R590 Localized enlarged lymph nodes: Secondary | ICD-10-CM | POA: Diagnosis not present

## 2022-02-28 DIAGNOSIS — R221 Localized swelling, mass and lump, neck: Secondary | ICD-10-CM

## 2022-02-28 DIAGNOSIS — D509 Iron deficiency anemia, unspecified: Secondary | ICD-10-CM | POA: Diagnosis not present

## 2022-02-28 DIAGNOSIS — J358 Other chronic diseases of tonsils and adenoids: Secondary | ICD-10-CM | POA: Diagnosis not present

## 2022-02-28 DIAGNOSIS — C770 Secondary and unspecified malignant neoplasm of lymph nodes of head, face and neck: Secondary | ICD-10-CM | POA: Diagnosis not present

## 2022-02-28 DIAGNOSIS — R55 Syncope and collapse: Secondary | ICD-10-CM | POA: Diagnosis not present

## 2022-02-28 LAB — ECHOCARDIOGRAM COMPLETE BUBBLE STUDY
AR max vel: 2.02 cm2
AV Area VTI: 2.36 cm2
AV Area mean vel: 2.2 cm2
AV Mean grad: 2 mmHg
AV Peak grad: 3.3 mmHg
Ao pk vel: 0.91 m/s
Area-P 1/2: 2.82 cm2
S' Lateral: 3.2 cm

## 2022-02-28 MED ORDER — THIAMINE MONONITRATE 100 MG PO TABS
100.0000 mg | ORAL_TABLET | Freq: Every day | ORAL | Status: DC
  Administered 2022-02-28 – 2022-03-01 (×2): 100 mg via ORAL
  Filled 2022-02-28 (×3): qty 1

## 2022-02-28 NOTE — Progress Notes (Signed)
*  PRELIMINARY RESULTS* Echocardiogram 2D Echocardiogram has been performed.  Clarence Skinner 02/28/2022, 7:46 AM

## 2022-02-28 NOTE — Progress Notes (Signed)
02/28/2022 at 0621:  Pt ambulated around nurse's station x2. Pt tolerated well. No syncopal episodes reported by pt. This RN will advance pt from high fall risk to moderate fall risk.

## 2022-02-28 NOTE — Plan of Care (Signed)

## 2022-02-28 NOTE — Procedures (Signed)
Interventional Radiology Procedure Note  Procedure: US guided biopsy of right neck lymph node  Indication: Neck mass  Findings: Please refer to procedural dictation for full description.  Complications: None  EBL: < 10 mL  Miachel Roux, MD [phone]

## 2022-02-28 NOTE — Progress Notes (Addendum)
PROGRESS NOTE    Borden Thune  AXK:[id number] DOB: 10-06-1960 DOA: 02/26/2022 PCP: Mechele Claude, FNP    Principal Problem:   Syncope Active Problems:   Tonsillar mass   Essential hypertension   Obstructive sleep apnea   Chronic obstructive pulmonary disease (HCC)   Anemia   Overweight (BMI 25.0-29.9)  Assessment and Plan: Syncope: likely secondary to hypotension & bradycardia. 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 S/p US guided biopsy of right neck lymph node 02/28/22. Pathology pending   HTN: holding home dose of amlodipine-benazepril   OSA: CPAP qhs   Microcytic anemia: possibly secondary to tonsillar carcinoma. No need for a transfusion currently   COPD: w/o exacerbation. Continue on bronchodilators    Overweight: BMI 27.4. Would benefit from weight loss     DVT prophylaxis: heparin  Code Status: full  Family Communication: Disposition Plan: likely d/c back home   Level of care: Telemetry Medical  Status is: Inpatient Remains inpatient appropriate because: severity of illness  Consultants:  ENT Onco   Procedures:   Antimicrobials:   Subjective: Pt c/o pain in his neck   Objective: Vitals:   02/28/22 0100 02/28/22 0426 02/28/22 0442 02/28/22 0800  BP: 107/60 (!) 102/52  121/68  Pulse: 72 62  (!) 51  Resp: '18 16  17  '$ Temp: 98.2 F (36.8 C) 98.2 F (36.8 C)  97.7 F (36.5 C)  TempSrc: Oral Oral    SpO2: 93% 93%  98%  Weight:   84.2 kg   Height:        Intake/Output Summary (Last 24 hours) at 02/28/2022 0823 Last data filed at 02/27/2022 2006 Gross per 24 hour  Intake 767.12 ml  Output 1350 ml  Net -582.88 ml   Filed Weights   02/26/22 2237 02/27/22 0355 02/28/22 0442  Weight: 78.7 kg 82.2 kg 84.2 kg    Examination:  General exam: Appears calm and  comfortable  Respiratory system: Clear to auscultation. Respiratory effort normal. Cardiovascular system: S1 & S2 +. No rubs, gallops or clicks.  Gastrointestinal system: Abdomen is nondistended, soft and nontender. Normal bowel sounds heard. Central nervous system: Alert and oriented. Moves all extremities  Psychiatry: Judgement and insight appear normal. Mood & affect appropriate.     Data Reviewed: I have personally reviewed following labs and imaging studies  CBC: Recent Labs  Lab 02/26/22 1504 02/27/22 0446  WBC 6.9 6.4  NEUTROABS 3.6  --   HGB 12.7* 12.1*  HCT 39.2 37.9*  MCV 75.0* 75.5*  PLT 316 544   Basic Metabolic Panel: Recent Labs  Lab 02/26/22 1504 02/27/22 0446  NA 138 137  K 3.3* 4.0  CL 108 112*  CO2 20* 19*  GLUCOSE 90 111*  BUN 12 11  CREATININE 1.26* 0.87  CALCIUM 8.1* 7.6*  MG 1.9  --    GFR: Estimated Creatinine Clearance: 91.4 mL/min (by C-G formula based on SCr of 0.87 mg/dL). Liver Function Tests: Recent Labs  Lab 02/26/22 1504 02/27/22 0446  AST 15 11*  ALT 9 10  ALKPHOS 54 56  BILITOT 0.5 0.5  PROT 6.6 6.1*  ALBUMIN 3.7 3.4*   No results for input(s): "LIPASE", "AMYLASE" in the last 168 hours. No results for input(s): "AMMONIA" in the last 168 hours. Coagulation Profile: No results for input(s): "INR", "PROTIME" in the last 168  hours. Cardiac Enzymes: No results for input(s): "CKTOTAL", "CKMB", "CKMBINDEX", "TROPONINI" in the last 168 hours. BNP (last 3 results) No results for input(s): "PROBNP" in the last 8760 hours. HbA1C: No results for input(s): "HGBA1C" in the last 72 hours. CBG: No results for input(s): "GLUCAP" in the last 168 hours. Lipid Profile: No results for input(s): "CHOL", "HDL", "LDLCALC", "TRIG", "CHOLHDL", "LDLDIRECT" in the last 72 hours. Thyroid Function Tests: No results for input(s): "TSH", "T4TOTAL", "FREET4", "T3FREE", "THYROIDAB" in the last 72 hours. Anemia Panel: No results for input(s):  "VITAMINB12", "FOLATE", "FERRITIN", "TIBC", "IRON", "RETICCTPCT" in the last 72 hours. Sepsis Labs: Recent Labs  Lab 02/26/22 1505 02/26/22 1700  LATICACIDVEN 3.2* 1.9    No results found for this or any previous visit (from the past 240 hour(s)).       Radiology Studies: US Carotid Bilateral  Result Date: 02/27/2022 CLINICAL DATA:  61 year old male with a history of syncope EXAM: BILATERAL CAROTID DUPLEX ULTRASOUND TECHNIQUE: Pearline Cables scale imaging, color Doppler and duplex ultrasound were performed of bilateral carotid and vertebral arteries in the neck. COMPARISON:  Neuro imaging of the same day and 1 day prior FINDINGS: Criteria: Quantification of carotid stenosis is based on velocity parameters that correlate the residual internal carotid diameter with NASCET-based stenosis levels, using the diameter of the distal internal carotid lumen as the denominator for stenosis measurement. The following velocity measurements were obtained: RIGHT ICA:  Systolic 831 cm/sec, Diastolic not recorded CCA:  57 cm/sec SYSTOLIC ICA/CCA RATIO:  2.0 ECA:  149 cm/sec LEFT ICA:  Systolic 62 cm/sec, Diastolic not recorded CCA:  48 cm/sec SYSTOLIC ICA/CCA RATIO:  1.3 ECA:  118 cm/sec Right Brachial SBP: Not acquired Left Brachial SBP: Not acquired RIGHT CAROTID ARTERY: No significant calcifications of the right common carotid artery. Intermediate waveform maintained. Heterogeneous and partially calcified plaque at the right carotid bifurcation. No significant lumen shadowing. Low resistance waveform of the right ICA. No significant tortuosity. RIGHT VERTEBRAL ARTERY: Antegrade flow with low resistance waveform. LEFT CAROTID ARTERY: No significant calcifications of the left common carotid artery. Intermediate waveform maintained. Heterogeneous and partially calcified plaque at the left carotid bifurcation without significant lumen shadowing. Low resistance waveform of the left ICA. No significant tortuosity. LEFT VERTEBRAL  ARTERY:  Antegrade flow with low resistance waveform. Additional: Incidental imaging of pathologic lymphadenopathy of the right neck, better characterized on recent cross-sectional neuro imaging. IMPRESSION: Color duplex indicates minimal heterogeneous and calcified plaque, with no hemodynamically significant stenosis by duplex criteria in the extracranial cerebrovascular circulation. Pathologic lymphadenopathy of the neck better characterized on recent neuro imaging. Signed, Dulcy Fanny. Nadene Rubins, RPVI [facility] Radiology Electronically Signed   By: Corrie Mckusick D.O.   On: 02/27/2022 14:02   MR NECK SOFT TISSUE ONLY W WO CONTRAST  Result Date: 02/27/2022 CLINICAL DATA:  61 year old male with dizziness. Neck pain. Right neck level 2/3 soft tissue mass on CTA neck yesterday thought to be abnormal lymph nodes. EXAM: MRI OF THE NECK WITH CONTRAST TECHNIQUE: Multiplanar, multisequence MR imaging was performed following the administration of intravenous contrast. CONTRAST:  79m GADAVIST GADOBUTROL 1 MMOL/ML IV SOLN COMPARISON:  CTA head and neck yesterday. Brain MRI today reported separately. FINDINGS: Pharynx and larynx: Larynx is within normal limits. Vallecula and hypopharynx within normal limits. Asymmetric although fairly subtle 15 mm area of masslike T2 hyperintensity and homogeneous enhancement along the posterior and lateral wall of the oropharynx on the right (series 12, image 11 and series 18, image  11). This area has abnormal diffusion on coronal brain DWI today. This is just below the soft palate and uvula which appear to remain normal. The mass is up to 2.4 cm long axis on series 14, image 12. Nasopharynx contour is within normal limits. Midline retropharyngeal space and bilateral parapharyngeal spaces are within normal limits. Salivary glands: Negative except for mild mass effect on the right submandibular gland, see nodal findings below. Thyroid:  Negative. Lymph nodes: Heterogeneous T2 hyperintense and enhancing soft tissue mass posterior to the right carotid space at the junction of the right level 2 B and 3B nodal station is 23 x 34 x 45 mm (AP by transverse by CC). This is inseparable from the posterior carotid space. And a 2nd smaller area of similar asymmetric T2 heterogeneous soft tissue and enhancement just below the skull base (series 12, image 6 and also series 13, image 14) is roughly 16 x 14 x 30 mm (AP by transverse by CC) and mildly narrow due the adjacent right ICA on the CTA yesterday. Burtis Junes this is malignant nodal versus ex nodal disease. No other cervical lymphadenopathy identified. Vascular: Maintained vascular flow voids in the neck. See CTA findings yesterday. Limited intracranial: Brain MRI today reported separately. Visualized orbits: Not included. Mastoids and visualized paranasal sinuses: Well aerated. Skeleton: Cervical spine degeneration including disc disease and degenerative appearing anterolisthesis of C3 on C4 with facet degeneration. No osseous metastatic disease identified. No cervical spine dural thickening. Cervical spinal cord appears to be normal. Upper chest: Better demonstrated on CTA yesterday. 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 Recommend ENT consultation. Electronically Signed   By: Genevie Ann M.D.   On: 02/27/2022 08:32   MR BRAIN WO CONTRAST  Result Date: 02/27/2022 CLINICAL DATA:  61 year old male with syncope, dizziness. EXAM: MRI HEAD WITHOUT CONTRAST TECHNIQUE: Multiplanar, multiecho pulse sequences of the brain and surrounding structures were obtained without intravenous contrast. COMPARISON:  CTA head and neck yesterday. FINDINGS: Brain: No restricted diffusion to suggest acute infarction. No midline shift, mass effect,  evidence of mass lesion, ventriculomegaly, extra-axial collection or acute intracranial hemorrhage. Cervicomedullary junction and pituitary are within normal limits. Patchy, widely scattered bilateral cerebral white matter T2 and FLAIR hyperintensity. This is in a nonspecific pattern. No cortical encephalomalacia identified. In the subcortical white matter of the left superior frontal gyrus posteriorly there is a small T2 heterogeneous 6-7 mm lesion (series 10, image 19) with pronounced hemosiderin on SWI (series 13, image 42). No regional edema or mass effect. No other chronic cerebral blood products. Deep gray nuclei, brainstem and cerebellum are within normal limits. Vascular: Major intracranial vascular flow voids are preserved. Skull and upper cervical spine: Negative visible cervical spine. Visualized bone marrow signal is within normal limits. Sinuses/Orbits: Negative orbits. Paranasal sinuses and mastoids are stable and well aerated. Other: There is is 3 cm segment of abnormal diffusion in the right upper neck just below the skull base (series 7, image 22. And similar asymmetric diffusion along the right posterior pharynx series 7, image 23. See dedicated Neck MRI today reported separately. IMPRESSION: 1. No acute intracranial abnormality. Abnormal right neck soft tissues. See dedicated Neck MRI today reported separately. 2. 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 These are slow flow vascular malformations which are generally asymptomatic.  3. Other moderately advanced but nonspecific cerebral white matter signal changes. Electronically Signed   By: Genevie Ann M.D.   On: 02/27/2022 08:22   CT ANGIO HEAD NECK W WO CM  Result Date: 02/26/2022 CLINICAL DATA:  Provided history: Dizziness, persistent/recurrent, cardiac or vascular cause suspected; recurrent syncope, neck pain, severe. EXAM: CT ANGIOGRAPHY HEAD AND NECK TECHNIQUE:  Multidetector CT imaging of the head and neck was performed using the standard protocol during bolus administration of intravenous contrast. Multiplanar CT image reconstructions and MIPs were obtained to evaluate the vascular anatomy. Carotid stenosis measurements (when applicable) are obtained utilizing NASCET criteria, using the distal internal carotid diameter as the denominator. RADIATION DOSE REDUCTION: This exam was performed according to the departmental dose-optimization program which includes automated exposure control, adjustment of the mA and/or kV according to patient size and/or use of iterative reconstruction technique. CONTRAST:  57m OMNIPAQUE IOHEXOL 350 MG/ML SOLN COMPARISON:  Head CT 06/18/2011. Report from brain MRI 05/12/1996 (images unavailable). FINDINGS: CT HEAD FINDINGS Brain: Mild generalized parenchymal atrophy. Mild patchy and ill-defined hypoattenuation within the cerebral white matter, nonspecific but most often secondary to chronic small vessel ischemia. There is no acute intracranial hemorrhage. No demarcated cortical infarct. No extra-axial fluid collection. No evidence of an intracranial mass. No midline shift. Vascular: No hyperdense vessel. Atherosclerotic calcifications. Skull: No fracture or aggressive osseous lesion. Sinuses/Orbits: No orbital mass or acute orbital finding. Mild mucosal thickening within the right frontal, bilateral ethmoid and bilateral maxillary sinuses. Review of the MIP images confirms the above findings CTA NECK FINDINGS Aortic arch: Standard aortic branching. Atherosclerotic plaque within the visualized aortic arch and proximal major branch vessels of the neck. Streak and beam hardening artifact arising from a dense left-sided contrast bolus partially obscures the left subclavian artery. Within this limitation, there is no appreciable hemodynamically significant innominate or proximal subclavian artery stenosis. Right carotid system: CCA and ICA patent  within the neck. Atherosclerotic plaque within the proximal ICA, resulting in less than 50% stenosis. There is abnormal soft tissue surrounding the mid-to-distal cervical ICA with associated mild narrowing of the ICA at these levels. Left carotid system: CCA and ICA patent within the neck without stenosis. Mild atherosclerotic plaque at the CCA origin and about the carotid bifurcation. Vertebral arteries: Vertebral arteries patent within the neck. Mild to moderate atherosclerotic narrowing at the origin of the right vertebral artery. Calcified plaque at the origin of the left vertebral artery with no more than mild stenosis. Skeleton: Reversal of the expected cervical lordosis. Cervical spondylosis. No acute fracture or aggressive osseous lesion. Other neck: 4.3 x 2.5 cm ovoid soft tissue focus at the right level II/level III stations (along the posterior aspect of the proximal ICA, carotid bifurcation and distal CCA). This likely reflects an enlarged lymph node (for instance as seen on series 10, image 223) (series 13, image 90). Subcentimeter thyroid nodules, not meeting consensus criteria for ultrasound follow-up based on size. Reference: J Am Coll Radiol. 2015 Feb;12(2): 143-50. Upper chest: Centrilobular and paraseptal emphysema. No consolidation within the imaged lung apices. Review of the MIP images confirms the above findings CTA HEAD FINDINGS Anterior circulation: The intracranial internal carotid arteries are patent. The M1 middle cerebral arteries are patent. No M2 proximal branch occlusion or high-grade proximal stenosis is identified. Atherosclerotic irregularity of the M2 and more distal MCA vessels, bilaterally. The anterior cerebral arteries are patent. No intracranial aneurysm is identified. Posterior circulation: The intracranial vertebral arteries are patent. The basilar artery is patent. The posterior  cerebral arteries are patent. A left posterior communicating artery is present. The right  posterior communicating artery is diminutive or absent. Venous sinuses: Within the limitations of contrast timing, no convincing thrombus. Anatomic variants: As described. Review of the MIP images confirms the above findings IMPRESSION: CT head: 1. No evidence of acute intracranial abnormality. 2. Mild patchy and ill-defined hypoattenuation within the cerebral white matter, nonspecific but most often secondary to chronic small vessel ischemia. 3. Mild paranasal sinus mucosal thickening. CTA neck: 1. 4.3 x 2.5 cm ovoid soft tissue focus at the right level II/III stations (along the posterior aspect of the proximal ICA, carotid bifurcation and distal CCA). This likely reflects an enlarged lymph node. Given the size, this is concerning for nodal metastatic disease, although a reactive/inflammatory lymph node is possible. 2. Abnormal soft tissue surrounding the mid-to-distal cervical right ICA with resultant mild vessel narrowing. Findings are nonspecific, but may reflect a nodal mass or sequela of a vasculopathy. A contrast-enhanced neck CT is recommended for initial further evaluation of this finding, and for initial further evaluation of the finding described in impression #1. 3. Atherosclerotic plaque within the bilateral common carotid and cervical internal carotid arteries without hemodynamically significant stenosis. 4. Vertebral arteries patent within the neck. Mild-to-moderate atherosclerotic narrowing at the origin of the right vertebral artery. No more than mild atherosclerotic stenosis at the origin of the left vertebral artery. 5. Aortic Atherosclerosis (ICD10-I70.0) and Emphysema (ICD10-J43.9). 6. Cervical spondylosis. CTA head: Intracranial atherosclerotic disease without intracranial large vessel occlusion or proximal high-grade arterial stenosis. Electronically Signed   By: Kellie Simmering D.O.   On: 02/26/2022 17:13   DG Chest Portable 1 View  Result Date: 02/26/2022 CLINICAL DATA:  Syncope EXAM:  PORTABLE CHEST 1 VIEW COMPARISON:  February 13, 2022, April 07, 2018 FINDINGS: The heart size and mediastinal contours are within normal limits. Both lungs are clear. The visualized skeletal structures are unremarkable. IMPRESSION: No acute cardiopulmonary abnormality. Electronically Signed   By: Beryle Flock M.D.   On: 02/26/2022 15:37        Scheduled Meds:  allopurinol  300 mg Oral Daily   aspirin EC  81 mg Oral Daily   heparin injection (subcutaneous)  5,000 Units Subcutaneous Q8H   nicotine  21 mg Transdermal Daily   pantoprazole  40 mg Oral Daily   rosuvastatin  20 mg Oral Daily   sodium chloride flush  3 mL Intravenous Q12H   thiamine (VITAMIN B1) injection  100 mg Intravenous Daily   Continuous Infusions:   LOS: 1 day    Time spent: 35 mins     Wyvonnia Dusky, MD [facility] Pager [phone]  If 7PM-7AM, please contact night-coverage  02/28/2022, 8:23 AM

## 2022-02-28 NOTE — Consult Note (Signed)
Hematology/Oncology Consult note [phone] [phone]) [phone]      Patient Care Team: Mechele Claude, FNP as PCP - General (Family Medicine)   Name of the patient: Clarence Skinner  [id number]  [birth year]   Date of visit: 02/28/22 REASON FOR COSULTATION:  Tonsillar mass with negative adenopathy History of presenting illness-  61 y.o. male with PMH listed at below who presents to ER for evaluation of syncope.  Patient reports intermittent syncope/near syncope episodes during which he feels diaphoretic, nauseated, first episode was in July 2023 when he was at a doctor's visit. He has also noticed right neck mass for about a months. 02/26/2022 CTA neck showed 1, 4.3 x 2.5 cm ovoid soft tissue focus at the right level II/III stations (along the posterior aspect of the proximal ICA, carotid bifurcation and distal CCA). This likely reflects an enlarged lymph node.  2 Abnormal soft tissue surrounding the mid-to-distal cervical right ICA with resultant mild vessel narrowing 3 Atherosclerotic plaque within the bilateral common carotid and cervical internal carotid arteries without hemodynamically significant stenosis. 4 Vertebral arteries patent within the neck. Mild-to-moderate atherosclerotic narrowing at the origin of the right vertebral artery. No more than mild atherosclerotic stenosis at the origin of the left vertebral artery. 5 Aortic Atherosclerosis and Emphysema 6. Cervical spondylosis  CTA head showed Intracranial atherosclerotic disease without intracranial large vessel occlusion or proximal high-grade arterial stenosis.  02/27/2022 MRI brain without contrast showed  No acute intracranial abnormality. Abnormal right neck soft tissues. See dedicated Neck MRI today reported separately. 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 These are slow flow vascular malformations which are generally  asymptomatic. [name] Other moderately advanced but nonspecific cerebral white matter signal changes. 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 02/27/2022, ultrasound carotid bilateral showed minimal heterogeneous and calcified plaque, with no hemodynamically significant stenosis by duplex criteria in the extracranial cerebrovascular circulation  02/28/2022, status post ultrasound core biopsy.  Preliminary report-squamous cell carcinoma.  Oncology was consulted for further evaluation.  Patient was seen at bedside.  Significant other Danae Chen is at the bedside. Patient drinks alcohol routinely.  Current everyday smoker.  Denies shortness of breath, swallowing difficulty.   Allergies  Allergen Reactions   Chocolate Itching, Nausea Only and Shortness Of Breath    (large amounts) Other reaction(s): Nausea, Vomiting, Shortness of Breath / Dyspnea   Eggs Or Egg-Derived Products Itching, Nausea And Vomiting, Nausea Only and Shortness Of Breath   Milk (Cow) Shortness Of Breath   Benadryl [Diphenhydramine] Other (See Comments)    Altered mental status "makes me crazy and figety"   Milk-Related Compounds Nausea And Vomiting    Patient Active Problem List   Diagnosis Date Noted   Anemia 02/27/2022   Tonsillar mass 02/27/2022   Overweight (BMI 25.0-29.9) 02/27/2022   Syncope 02/26/2022   Chronic obstructive pulmonary disease ([place]) 06/17/2020   Mass of upper lobe of left lung 12/02/2017   Obstructive sleep apnea 03/13/2017   Essential hypertension 12/27/2016     Past Medical History:  Diagnosis Date   COPD (chronic obstructive pulmonary disease) (HCC)    GERD (gastroesophageal reflux disease)    Gout    Hypertension    Multilevel degenerative disc disease    Pneumonia 11/29/2017   Sleep apnea  CPAP   Tachycardia    Wears dentures    partial upper and lower     Past Surgical History:  Procedure Laterality Date   ABCESS DRAINAGE  2009   tonsil   COLONOSCOPY WITH PROPOFOL N/A 02/11/2018   Procedure: COLONOSCOPY WITH PROPOFOL;  Surgeon: Toledo, Benay Pike, MD;  Location: [facility];  Service: Endoscopy;  Laterality: N/A;   ESOPHAGOGASTRODUODENOSCOPY (EGD) WITH PROPOFOL N/A 02/11/2018   Procedure: ESOPHAGOGASTRODUODENOSCOPY (EGD) WITH PROPOFOL;  Surgeon: Toledo, Benay Pike, MD;  Location: [facility];  Service: Endoscopy;  Laterality: N/A;   MASS EXCISION Right 08/14/2017   Procedure: EXCISION RIGHT TEMPORAL MASS SUBMENTAL MASS;  Surgeon: Carloyn Manner, MD;  Location: [facility];  Service: ENT;  Laterality: Right;  sleep apnea   RADIOACTIVE SEED IMPLANT N/A 01/26/2019   Procedure: RADIOACTIVE SEED IMPLANT/BRACHYTHERAPY IMPLANT;  Surgeon: Billey Co, MD;  Location: [facility];  Service: Urology;  Laterality: N/A;   SEPTOPLASTY  2016   [place]   VOLUME STUDY N/A 12/29/2018   Procedure: VOLUME STUDY;  Surgeon: Billey Co, MD;  Location: [facility];  Service: Urology;  Laterality: N/A;    Social History   Socioeconomic History   Marital status: Significant Other    Spouse name: Not on file   Number of children: Not on file   Years of education: Not on file   Highest education level: Not on file  Occupational History   Not on file  Tobacco Use   Smoking status: Every Day    Packs/day: 1.00    Years: 30.00    Total pack years: 30.00    Types: Cigarettes   Smokeless tobacco: Former    Types: Snuff, Chew    Quit date: 12/05/1984   Tobacco comments:    since age 78  Vaping Use   Vaping Use: Never used  Substance and Sexual Activity   Alcohol use: Yes    Alcohol/week: 12.0 standard drinks of alcohol    Types: 12 Cans of beer per week    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   Drug use: Not Currently   Sexual activity: Yes   Other Topics Concern   Not on file  Social History Narrative   Not on file   Social Determinants of Health   Financial Resource Strain: Not on file  Food Insecurity: Not on file  Transportation Needs: Not on file  Physical Activity: Not on file  Stress: Not on file  Social Connections: Not on file  Intimate Partner Violence: Not on file     Family History  Problem Relation Age of Onset   Anxiety disorder Mother    Hypertension Sister    Diabetes Sister    Other Brother        mva  at age 48   Asthma Sister    Gout Brother    Hypertension Brother      Current Facility-Administered Medications:    acetaminophen (TYLENOL) tablet 650 mg, 650 mg, Oral, Q6H PRN **OR** acetaminophen (TYLENOL) suppository 650 mg, 650 mg, Rectal, Q6H PRN, Posey Pronto, Ekta V, MD   albuterol (PROVENTIL) (2.5 MG/3ML) 0.083% nebulizer solution 2.5 mg, 2.5 mg, Inhalation, Q6H PRN, Para Skeans, MD   allopurinol (ZYLOPRIM) tablet 300 mg, 300 mg, Oral, Daily, Florina Ou V, MD, 300 mg at 02/28/22 4098   aspirin EC tablet 81 mg, 81 mg, Oral, Daily, Florina Ou V, MD, 81 mg at 02/28/22 0952   heparin injection 5,000 Units, 5,000  Units, Subcutaneous, Q8H, Florina Ou V, MD, 5,000 Units at 02/28/22 1324   morphine (PF) 2 MG/ML injection 2 mg, 2 mg, Intravenous, Q4H PRN, Florina Ou V, MD, 2 mg at 02/28/22 1442   nicotine (NICODERM CQ - dosed in mg/24 hours) patch 21 mg, 21 mg, Transdermal, Daily, Posey Pronto, Ekta V, MD   pantoprazole (PROTONIX) EC tablet 40 mg, 40 mg, Oral, Daily, Florina Ou V, MD, 40 mg at 02/28/22 4235   rosuvastatin (CRESTOR) tablet 20 mg, 20 mg, Oral, Daily, Florina Ou V, MD, 20 mg at 02/28/22 0952   sodium chloride flush (NS) 0.9 % injection 3 mL, 3 mL, Intravenous, Q12H, Florina Ou V, MD, 3 mL at 02/28/22 0959   thiamine (VITAMIN B1) tablet 100 mg, 100 mg, Oral, Daily, Wyvonnia Dusky, MD, 100 mg at 02/28/22 3614  Review of Systems  Constitutional:  Negative for appetite change, chills,  fatigue, fever and unexpected weight change.  HENT:   Negative for hearing loss and voice change.        Right neck mass  Eyes:  Negative for eye problems and icterus.  Respiratory:  Negative for chest tightness, cough and shortness of breath.   Cardiovascular:  Negative for chest pain and leg swelling.  Gastrointestinal:  Negative for abdominal distention and abdominal pain.  Endocrine: Negative for hot flashes.  Genitourinary:  Negative for difficulty urinating, dysuria and frequency.   Musculoskeletal:  Negative for arthralgias.  Skin:  Negative for itching and rash.  Neurological:  Positive for light-headedness. Negative for numbness.       Intermittent syncope/near syncope episodes  Hematological:  Negative for adenopathy. Does not bruise/bleed easily.  Psychiatric/Behavioral:  Negative for confusion.      Physical exam:  Vitals:   02/28/22 1052 02/28/22 1100 02/28/22 1156 02/28/22 1523  BP: 134/71 134/71 119/73 126/68  Pulse: 61 64 63 62  Resp: '18  17 16  '$ Temp:   (!) 97.5 F (36.4 C) 98.4 F (36.9 C)  TempSrc:   Oral Oral  SpO2: 97%  96% 94%  Weight:      Height:       Physical Exam Constitutional:      General: He is not in acute distress.    Appearance: He is not diaphoretic.  HENT:     Head: Normocephalic and atraumatic.     Nose: Nose normal.     Mouth/Throat:     Pharynx: No oropharyngeal exudate.  Eyes:     General: No scleral icterus.    Pupils: Pupils are equal, round, and reactive to light.  Cardiovascular:     Rate and Rhythm: Normal rate and regular rhythm.     Heart sounds: No murmur heard. Pulmonary:     Effort: Pulmonary effort is normal. No respiratory distress.     Breath sounds: No rales.  Chest:     Chest wall: No tenderness.  Abdominal:     General: There is no distension.     Palpations: Abdomen is soft.     Tenderness: There is no abdominal tenderness.  Musculoskeletal:        General: Normal range of motion.     Cervical back:  Normal range of motion and neck supple.  Lymphadenopathy:     Cervical: Cervical adenopathy present.  Skin:    General: Skin is warm and dry.     Findings: No erythema.  Neurological:     Mental Status: He is alert and oriented to person, place, and time.  Cranial Nerves: No cranial nerve deficit.     Motor: No abnormal muscle tone.  Psychiatric:        Mood and Affect: Mood and affect normal.            Latest Ref Rng & Units 02/27/2022    4:46 AM 02/26/2022    3:04 PM 02/13/2022    8:35 PM  CBC  WBC 4.0 - 10.5 K/uL 6.4  6.9  8.1   Hemoglobin 13.0 - 17.0 g/dL 12.1  12.7  12.2   Hematocrit 39.0 - 52.0 % 37.9  39.2  38.1   Platelets 150 - 400 K/uL 263  316  260       Latest Ref Rng & Units 02/27/2022    4:46 AM 02/26/2022    3:04 PM 02/13/2022    8:35 PM  CMP  Glucose 70 - 99 mg/dL 111  90  96   BUN 6 - 20 mg/dL '11  12  13   '$ Creatinine 0.61 - 1.24 mg/dL 0.87  1.26  1.09   Sodium 135 - 145 mmol/L 137  138  135   Potassium 3.5 - 5.1 mmol/L 4.0  3.3  3.6   Chloride 98 - 111 mmol/L 112  108  109   CO2 22 - 32 mmol/L '19  20  20   '$ Calcium 8.9 - 10.3 mg/dL 7.6  8.1  8.5   Total Protein 6.5 - 8.1 g/dL 6.1  6.6  6.9   Total Bilirubin 0.3 - 1.2 mg/dL 0.5  0.5  0.3   Alkaline Phos 38 - 126 U/L 56  54  68   AST 15 - 41 U/L '11  15  17   '$ ALT 0 - 44 U/L '10  9  11        '$ RADIOGRAPHIC STUDIES: I have personally reviewed the radiological images as listed and agreed with the findings in the report. [place] CORE BIOPSY (LYMPH NODES)  Result Date: 02/28/2022 INDICATION: Tonsillar mass and right neck lymphadenopathy EXAM: Ultrasound-guided biopsy of right neck lymph node MEDICATIONS: None. COMPLICATIONS: None immediate. PROCEDURE: Informed written consent was obtained from the patient after a thorough discussion of the procedural risks, benefits and alternatives. All questions were addressed. Maximal Sterile Barrier Technique was utilized including caps, mask, sterile gowns, sterile gloves,  sterile drape, hand hygiene and skin antiseptic. A timeout was performed prior to the initiation of the procedure. Patient position supine on the ultrasound table. Right neck skin prepped and draped in usual sterile fashion. Following local lidocaine administration, four 18 gauge cores were obtained from the enlarged right neck lymph node utilizing continuous ultrasound guidance. Samples were sent to pathology in sterile saline. Needle removed and hemostasis achieved with 1 minutes of manual compression. Post procedure ultrasound images showed no evidence of significant hemorrhage. IMPRESSION: Ultrasound-guided biopsy of enlarged right neck lymph node. Electronically Signed   By: Miachel Roux M.D.   On: 02/28/2022 15:43   ECHOCARDIOGRAM COMPLETE BUBBLE STUDY  Result Date: 02/28/2022    ECHOCARDIOGRAM REPORT   Patient Name:   [facility] Date of Exam: 02/28/2022 Medical Rec #:  [id number]      Height:       69.0 in Accession #:    [id number]     Weight:       185.6 lb Date of Birth:  08/10/1960      BSA:          2.002 m Patient Age:    18 years  BP:           102/52 mmHg Patient Gender: M              HR:           62 bpm. Exam Location:  [facility] Procedure: 2D Echo, Cardiac Doppler, Color Doppler and Saline Contrast Bubble            Study Indications:     Syncope 780.2 / R55  History:         Patient has no prior history of Echocardiogram examinations.                  COPD; Risk Factors:Hypertension.  Sonographer:     Sherrie Sport Referring Phys:  [place] Diagnosing Phys: Serafina Royals MD  Sonographer Comments: Suboptimal apical window. IMPRESSIONS  1. Left ventricular ejection fraction, by estimation, is 60 to 65%. The left ventricle has normal function. The left ventricle has no regional wall motion abnormalities. Left ventricular diastolic parameters were normal.  2. Right ventricular systolic function is normal. The right ventricular size is normal.  3. The mitral valve is normal in structure.  Mild to moderate mitral valve regurgitation.  4. Tricuspid valve regurgitation is moderate.  5. The aortic valve is normal in structure. Aortic valve regurgitation is not visualized. FINDINGS  Left Ventricle: Left ventricular ejection fraction, by estimation, is 60 to 65%. The left ventricle has normal function. The left ventricle has no regional wall motion abnormalities. The left ventricular internal cavity size was normal in size. There is  no left ventricular hypertrophy. Left ventricular diastolic parameters were normal. Right Ventricle: The right ventricular size is normal. No increase in right ventricular wall thickness. Right ventricular systolic function is normal. Left Atrium: Left atrial size was normal in size. Right Atrium: Right atrial size was normal in size. Pericardium: There is no evidence of pericardial effusion. Mitral Valve: The mitral valve is normal in structure. Mild to moderate mitral valve regurgitation. Tricuspid Valve: The tricuspid valve is normal in structure. Tricuspid valve regurgitation is moderate. Aortic Valve: The aortic valve is normal in structure. Aortic valve regurgitation is not visualized. Aortic valve mean gradient measures 2.0 mmHg. Aortic valve peak gradient measures 3.3 mmHg. Aortic valve area, by VTI measures 2.36 cm. Pulmonic Valve: The pulmonic valve was normal in structure. Pulmonic valve regurgitation is not visualized. Aorta: The aortic root and ascending aorta are structurally normal, with no evidence of dilitation. IAS/Shunts: No atrial level shunt detected by color flow Doppler. Agitated saline contrast was given intravenously to evaluate for intracardiac shunting.  LEFT VENTRICLE PLAX 2D LVIDd:         4.60 cm   Diastology LVIDs:         3.20 cm   LV e' medial:    10.40 cm/s LV PW:         1.10 cm   LV E/e' medial:  4.2 LV IVS:        0.90 cm   LV e' lateral:   11.40 cm/s LVOT diam:     2.00 cm   LV E/e' lateral: 3.9 LV SV:         46 LV SV Index:   23 LVOT  Area:     3.14 cm  RIGHT VENTRICLE RV Basal diam:  3.90 cm RV S prime:     12.90 cm/s TAPSE (M-mode): 2.0 cm LEFT ATRIUM             Index  RIGHT ATRIUM           Index LA diam:        3.00 cm 1.50 cm/m   RA Area:     18.60 cm LA Vol (A2C):   45.3 ml 22.63 ml/m  RA Volume:   55.60 ml  27.78 ml/m LA Vol (A4C):   38.4 ml 19.19 ml/m LA Biplane Vol: 44.3 ml 22.13 ml/m  AORTIC VALVE AV Area (Vmax):    2.02 cm AV Area (Vmean):   2.20 cm AV Area (VTI):     2.36 cm AV Vmax:           91.10 cm/s AV Vmean:          58.750 cm/s AV VTI:            0.194 m AV Peak Grad:      3.3 mmHg AV Mean Grad:      2.0 mmHg LVOT Vmax:         58.70 cm/s LVOT Vmean:        41.100 cm/s LVOT VTI:          0.146 m LVOT/AV VTI ratio: 0.75  AORTA Ao Root diam: 2.90 cm MITRAL VALVE               TRICUSPID VALVE MV Area (PHT): 2.82 cm    TR Peak grad:   27.2 mmHg MV Decel Time: 269 msec    TR Vmax:        261.00 cm/s MV E velocity: 44.10 cm/s MV A velocity: 97.30 cm/s  SHUNTS MV E/A ratio:  0.45        Systemic VTI:  0.15 m                            Systemic Diam: 2.00 cm Serafina Royals MD Electronically signed by Serafina Royals MD Signature Date/Time: 02/28/2022/12:51:18 PM    Final    US Carotid Bilateral  Result Date: 02/27/2022 CLINICAL DATA:  61 year old male with a history of syncope EXAM: BILATERAL CAROTID DUPLEX ULTRASOUND TECHNIQUE: Pearline Cables scale imaging, color Doppler and duplex ultrasound were performed of bilateral carotid and vertebral arteries in the neck. COMPARISON:  Neuro imaging of the same day and 1 day prior FINDINGS: Criteria: Quantification of carotid stenosis is based on velocity parameters that correlate the residual internal carotid diameter with NASCET-based stenosis levels, using the diameter of the distal internal carotid lumen as the denominator for stenosis measurement. The following velocity measurements were obtained: RIGHT ICA:  Systolic 876 cm/sec, Diastolic not recorded CCA:  57 cm/sec SYSTOLIC  ICA/CCA RATIO:  2.0 ECA:  149 cm/sec LEFT ICA:  Systolic 62 cm/sec, Diastolic not recorded CCA:  48 cm/sec SYSTOLIC ICA/CCA RATIO:  1.3 ECA:  118 cm/sec Right Brachial SBP: Not acquired Left Brachial SBP: Not acquired RIGHT CAROTID ARTERY: No significant calcifications of the right common carotid artery. Intermediate waveform maintained. Heterogeneous and partially calcified plaque at the right carotid bifurcation. No significant lumen shadowing. Low resistance waveform of the right ICA. No significant tortuosity. RIGHT VERTEBRAL ARTERY: Antegrade flow with low resistance waveform. LEFT CAROTID ARTERY: No significant calcifications of the left common carotid artery. Intermediate waveform maintained. Heterogeneous and partially calcified plaque at the left carotid bifurcation without significant lumen shadowing. Low resistance waveform of the left ICA. No significant tortuosity. LEFT VERTEBRAL ARTERY:  Antegrade flow with low resistance waveform. Additional: Incidental imaging of pathologic lymphadenopathy of the right neck, better characterized  on recent cross-sectional neuro imaging. IMPRESSION: Color duplex indicates minimal heterogeneous and calcified plaque, with no hemodynamically significant stenosis by duplex criteria in the extracranial cerebrovascular circulation. Pathologic lymphadenopathy of the neck better characterized on recent neuro imaging. Signed, Dulcy Fanny. Nadene Rubins, RPVI [facility] Electronically Signed   By: Corrie Mckusick D.O.   On: 02/27/2022 14:02   MR NECK SOFT TISSUE ONLY W WO CONTRAST  Result Date: 02/27/2022 CLINICAL DATA:  61 year old male with dizziness. Neck pain. Right neck level 2/3 soft tissue mass on CTA neck yesterday thought to be abnormal lymph nodes. EXAM: MRI OF THE NECK WITH CONTRAST TECHNIQUE: Multiplanar, multisequence MR imaging was performed following the administration of intravenous contrast. CONTRAST:   68m GADAVIST GADOBUTROL 1 MMOL/ML IV SOLN COMPARISON:  CTA head and neck yesterday. Brain MRI today reported separately. FINDINGS: Pharynx and larynx: Larynx is within normal limits. Vallecula and hypopharynx within normal limits. Asymmetric although fairly subtle 15 mm area of masslike T2 hyperintensity and homogeneous enhancement along the posterior and lateral wall of the oropharynx on the right (series 12, image 11 and series 18, image 11). This area has abnormal diffusion on coronal brain DWI today. This is just below the soft palate and uvula which appear to remain normal. The mass is up to 2.4 cm long axis on series 14, image 12. Nasopharynx contour is within normal limits. Midline retropharyngeal space and bilateral parapharyngeal spaces are within normal limits. Salivary glands: Negative except for mild mass effect on the right submandibular gland, see nodal findings below. Thyroid: Negative. Lymph nodes: Heterogeneous T2 hyperintense and enhancing soft tissue mass posterior to the right carotid space at the junction of the right level 2 B and 3B nodal station is 23 x 34 x 45 mm (AP by transverse by CC). This is inseparable from the posterior carotid space. And a 2nd smaller area of similar asymmetric T2 heterogeneous soft tissue and enhancement just below the skull base (series 12, image 6 and also series 13, image 14) is roughly 16 x 14 x 30 mm (AP by transverse by CC) and mildly narrow due the adjacent right ICA on the CTA yesterday. FBurtis Junesthis is malignant nodal versus ex nodal disease. No other cervical lymphadenopathy identified. Vascular: Maintained vascular flow voids in the neck. See CTA findings yesterday. Limited intracranial: Brain MRI today reported separately. Visualized orbits: Not included. Mastoids and visualized paranasal sinuses: Well aerated. Skeleton: Cervical spine degeneration including disc disease and degenerative appearing anterolisthesis of C3 on C4 with facet degeneration. No  osseous metastatic disease identified. No cervical spine dural thickening. Cervical spinal cord appears to be normal. Upper chest: Better demonstrated on CTA yesterday. 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 Recommend ENT consultation. Electronically Signed   By: HGenevie AnnM.D.   On: 02/27/2022 08:32   MR BRAIN WO CONTRAST  Result Date: 02/27/2022 CLINICAL DATA:  61year old male with syncope, dizziness. EXAM: MRI HEAD WITHOUT CONTRAST TECHNIQUE: Multiplanar, multiecho pulse sequences of the brain and surrounding structures were obtained without intravenous contrast. COMPARISON:  CTA head and neck yesterday. FINDINGS: Brain: No restricted diffusion to suggest acute infarction. No midline shift, mass effect, evidence of mass lesion, ventriculomegaly, extra-axial collection or acute intracranial hemorrhage. Cervicomedullary junction and pituitary are within normal limits. Patchy, widely scattered bilateral cerebral white matter T2  and FLAIR hyperintensity. This is in a nonspecific pattern. No cortical encephalomalacia identified. In the subcortical white matter of the left superior frontal gyrus posteriorly there is a small T2 heterogeneous 6-7 mm lesion (series 10, image 19) with pronounced hemosiderin on SWI (series 13, image 42). No regional edema or mass effect. No other chronic cerebral blood products. Deep gray nuclei, brainstem and cerebellum are within normal limits. Vascular: Major intracranial vascular flow voids are preserved. Skull and upper cervical spine: Negative visible cervical spine. Visualized bone marrow signal is within normal limits. Sinuses/Orbits: Negative orbits. Paranasal sinuses and mastoids are stable and well aerated. Other: There is is 3 cm segment of abnormal diffusion in the right upper neck just below  the skull base (series 7, image 22. And similar asymmetric diffusion along the right posterior pharynx series 7, image 23. See dedicated Neck MRI today reported separately. IMPRESSION: 1. No acute intracranial abnormality. Abnormal right neck soft tissues. See dedicated Neck MRI today reported separately. 2. 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 These are slow flow vascular malformations which are generally asymptomatic. 3. Other moderately advanced but nonspecific cerebral white matter signal changes. Electronically Signed   By: Genevie Ann M.D.   On: 02/27/2022 08:22   CT ANGIO HEAD NECK W WO CM  Result Date: 02/26/2022 CLINICAL DATA:  Provided history: Dizziness, persistent/recurrent, cardiac or vascular cause suspected; recurrent syncope, neck pain, severe. EXAM: CT ANGIOGRAPHY HEAD AND NECK TECHNIQUE: Multidetector CT imaging of the head and neck was performed using the standard protocol during bolus administration of intravenous contrast. Multiplanar CT image reconstructions and MIPs were obtained to evaluate the vascular anatomy. Carotid stenosis measurements (when applicable) are obtained utilizing NASCET criteria, using the distal internal carotid diameter as the denominator. RADIATION DOSE REDUCTION: This exam was performed according to the departmental dose-optimization program which includes automated exposure control, adjustment of the mA and/or kV according to patient size and/or use of iterative reconstruction technique. CONTRAST:  38m OMNIPAQUE IOHEXOL 350 MG/ML SOLN COMPARISON:  Head CT 06/18/2011. Report from brain MRI 05/12/1996 (images unavailable). FINDINGS: CT HEAD FINDINGS Brain: Mild generalized parenchymal atrophy. Mild patchy and ill-defined hypoattenuation within the cerebral white matter, nonspecific but most often secondary to chronic small vessel ischemia. There is no acute intracranial hemorrhage. No  demarcated cortical infarct. No extra-axial fluid collection. No evidence of an intracranial mass. No midline shift. Vascular: No hyperdense vessel. Atherosclerotic calcifications. Skull: No fracture or aggressive osseous lesion. Sinuses/Orbits: No orbital mass or acute orbital finding. Mild mucosal thickening within the right frontal, bilateral ethmoid and bilateral maxillary sinuses. Review of the MIP images confirms the above findings CTA NECK FINDINGS Aortic arch: Standard aortic branching. Atherosclerotic plaque within the visualized aortic arch and proximal major branch vessels of the neck. Streak and beam hardening artifact arising from a dense left-sided contrast bolus partially obscures the left subclavian artery. Within this limitation, there is no appreciable hemodynamically significant innominate or proximal subclavian artery stenosis. Right carotid system: CCA and ICA patent within the neck. Atherosclerotic plaque within the proximal ICA, resulting in less than 50% stenosis. There is abnormal soft tissue surrounding the mid-to-distal cervical ICA with associated mild narrowing of the ICA at these levels. Left carotid system: CCA and ICA patent within the neck without stenosis. Mild atherosclerotic plaque at the CCA origin and about the carotid bifurcation. Vertebral arteries: Vertebral arteries patent within the neck. Mild to moderate atherosclerotic narrowing at the origin of  the right vertebral artery. Calcified plaque at the origin of the left vertebral artery with no more than mild stenosis. Skeleton: Reversal of the expected cervical lordosis. Cervical spondylosis. No acute fracture or aggressive osseous lesion. Other neck: 4.3 x 2.5 cm ovoid soft tissue focus at the right level II/level III stations (along the posterior aspect of the proximal ICA, carotid bifurcation and distal CCA). This likely reflects an enlarged lymph node (for instance as seen on series 10, image 223) (series 13, image 90).  Subcentimeter thyroid nodules, not meeting consensus criteria for ultrasound follow-up based on size. Reference: J Am Coll Radiol. 2015 Feb;12(2): 143-50. Upper chest: Centrilobular and paraseptal emphysema. No consolidation within the imaged lung apices. Review of the MIP images confirms the above findings CTA HEAD FINDINGS Anterior circulation: The intracranial internal carotid arteries are patent. The M1 middle cerebral arteries are patent. No M2 proximal branch occlusion or high-grade proximal stenosis is identified. Atherosclerotic irregularity of the M2 and more distal MCA vessels, bilaterally. The anterior cerebral arteries are patent. No intracranial aneurysm is identified. Posterior circulation: The intracranial vertebral arteries are patent. The basilar artery is patent. The posterior cerebral arteries are patent. A left posterior communicating artery is present. The right posterior communicating artery is diminutive or absent. Venous sinuses: Within the limitations of contrast timing, no convincing thrombus. Anatomic variants: As described. Review of the MIP images confirms the above findings IMPRESSION: CT head: 1. No evidence of acute intracranial abnormality. 2. Mild patchy and ill-defined hypoattenuation within the cerebral white matter, nonspecific but most often secondary to chronic small vessel ischemia. 3. Mild paranasal sinus mucosal thickening. CTA neck: 1. 4.3 x 2.5 cm ovoid soft tissue focus at the right level II/III stations (along the posterior aspect of the proximal ICA, carotid bifurcation and distal CCA). This likely reflects an enlarged lymph node. Given the size, this is concerning for nodal metastatic disease, although a reactive/inflammatory lymph node is possible. 2. Abnormal soft tissue surrounding the mid-to-distal cervical right ICA with resultant mild vessel narrowing. Findings are nonspecific, but may reflect a nodal mass or sequela of a vasculopathy. A contrast-enhanced neck CT  is recommended for initial further evaluation of this finding, and for initial further evaluation of the finding described in impression #1. 3. Atherosclerotic plaque within the bilateral common carotid and cervical internal carotid arteries without hemodynamically significant stenosis. 4. Vertebral arteries patent within the neck. Mild-to-moderate atherosclerotic narrowing at the origin of the right vertebral artery. No more than mild atherosclerotic stenosis at the origin of the left vertebral artery. 5. Aortic Atherosclerosis (ICD10-I70.0) and Emphysema (ICD10-J43.9). 6. Cervical spondylosis. CTA head: Intracranial atherosclerotic disease without intracranial large vessel occlusion or proximal high-grade arterial stenosis. Electronically Signed   By: Kellie Simmering D.O.   On: 02/26/2022 17:13   DG Chest Portable 1 View  Result Date: 02/26/2022 CLINICAL DATA:  Syncope EXAM: PORTABLE CHEST 1 VIEW COMPARISON:  February 13, 2022, April 07, 2018 FINDINGS: The heart size and mediastinal contours are within normal limits. Both lungs are clear. The visualized skeletal structures are unremarkable. IMPRESSION: No acute cardiopulmonary abnormality. Electronically Signed   By: Beryle Flock M.D.   On: 02/26/2022 15:37   DG Chest Port 1 View  Result Date: 02/13/2022 CLINICAL DATA:  Chest pain bradycardia EXAM: PORTABLE CHEST 1 VIEW COMPARISON:  05/28/2011, 11/29/2017 FINDINGS: The heart size and mediastinal contours are within normal limits. Both lungs are clear. The visualized skeletal structures are unremarkable. IMPRESSION: No active disease. Electronically Signed   By: Maudie Mercury  Francoise Ceo M.D.   On: 02/13/2022 20:41     Assessment and plan-   # Right tonsillar mass with associated right level 2 and level 3 lymph nodes.  Extranodal disease. Status post ultrasound-guided biopsy. 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  Awaiting official results Discussed with patient about clinical suspicion of  head and neck cancer Recommend outpatient PET scan for evaluation of disease extent. If local regional disease, recommend concurrent chemoradiation. If systemic disease, recommend systemic chemotherapy Smoke cessation and alcohol cessation discussed with patient.  # Syncope, intermittent near syncope episodes. Patient has normal LVEF on echocardiogram. ?  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   BP meds were held. Consult radiation oncology Dr. Baruch Gouty for evaluation of need of inpatient radiation.[I will talk to Dr. Baruch Gouty  Thank you for allowing me to participate in the care of this patient.   [name], MD, PhD Hematology Oncology 02/28/2022

## 2022-03-01 ENCOUNTER — Other Ambulatory Visit: Payer: Self-pay | Admitting: Pathology

## 2022-03-01 ENCOUNTER — Ambulatory Visit: Payer: Medicaid Other | Admitting: Radiation Oncology

## 2022-03-01 ENCOUNTER — Telehealth: Payer: Self-pay

## 2022-03-01 ENCOUNTER — Other Ambulatory Visit: Payer: Self-pay

## 2022-03-01 DIAGNOSIS — C099 Malignant neoplasm of tonsil, unspecified: Secondary | ICD-10-CM | POA: Diagnosis not present

## 2022-03-01 DIAGNOSIS — J358 Other chronic diseases of tonsils and adenoids: Secondary | ICD-10-CM

## 2022-03-01 DIAGNOSIS — F17200 Nicotine dependence, unspecified, uncomplicated: Secondary | ICD-10-CM | POA: Diagnosis not present

## 2022-03-01 DIAGNOSIS — R55 Syncope and collapse: Secondary | ICD-10-CM | POA: Diagnosis not present

## 2022-03-01 LAB — BASIC METABOLIC PANEL
Anion gap: 3 — ABNORMAL LOW (ref 5–15)
BUN: 10 mg/dL (ref 6–20)
CO2: 24 mmol/L (ref 22–32)
Calcium: 8.1 mg/dL — ABNORMAL LOW (ref 8.9–10.3)
Chloride: 113 mmol/L — ABNORMAL HIGH (ref 98–111)
Creatinine, Ser: 0.96 mg/dL (ref 0.61–1.24)
GFR, Estimated: >60 mL/min (ref >60)
Glucose, Bld: 93 mg/dL (ref 70–99)
Potassium: 3.5 mmol/L (ref 3.5–5.1)
Sodium: 140 mmol/L (ref 135–145)

## 2022-03-01 LAB — CBC
HCT: 34.7 % — ABNORMAL LOW (ref 39.0–52.0)
Hemoglobin: 11 g/dL — ABNORMAL LOW (ref 13.0–17.0)
MCH: 23.9 pg — ABNORMAL LOW (ref 26.0–34.0)
MCHC: 31.7 g/dL (ref 30.0–36.0)
MCV: 75.3 fL — ABNORMAL LOW (ref 80.0–100.0)
Platelets: 264 10*3/uL (ref 150–400)
RBC: 4.61 MIL/uL (ref 4.22–5.81)
RDW: 15.3 % (ref 11.5–15.5)
WBC: 6.6 10*3/uL (ref 4.0–10.5)
nRBC: 0 % (ref 0.0–0.2)

## 2022-03-01 LAB — IRON AND TIBC
Iron: 103 ug/dL (ref 45–182)
Saturation Ratios: 44 % — ABNORMAL HIGH (ref 17.9–39.5)
TIBC: 232 ug/dL — ABNORMAL LOW (ref 250–450)
UIBC: 129 ug/dL

## 2022-03-01 LAB — SURGICAL PATHOLOGY

## 2022-03-01 LAB — FERRITIN: Ferritin: 114 ng/mL (ref 24–336)

## 2022-03-01 MED ORDER — OXYCODONE-ACETAMINOPHEN 5-325 MG PO TABS: 1.0000 | ORAL_TABLET | Freq: Four times a day (QID) | ORAL | 0 refills | Status: AC | PRN

## 2022-03-01 NOTE — Telephone Encounter (Signed)
Per secure chat from Dr.Yu:  "inpatient consult, seen by me yesterday. he needs a PET asap. please check if we can get it done before 8/17, Dr.chyrstal want that before sim on 8/17. he is still inpatient should go home soon"  [place], please schedule patient for PET stat. Please inform patient of appt. Thanks

## 2022-03-01 NOTE — Consult Note (Signed)
NEW PATIENT EVALUATION  Name: Clarence Skinner  MRN: [id number]  Date:   02/26/2022     DOB: 16-Oct-1960   This 61 y.o. 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  REFERRING PHYSICIAN: No ref. provider found  CHIEF COMPLAINT:  Chief Complaint  Patient presents with   Loss of Consciousness    X 2 episodes of syncope, brady cardia     DIAGNOSIS: The encounter diagnosis was Mass of right side of neck.   PREVIOUS INVESTIGATIONS:  CT scans MRI scans reviewed PET CT scan ordered Pathology report reviewed Clinical notes reviewed  HPI: Patient is a 61 year old male well-known to our department having received I-125 interstitial implant back in 2020 for adenocarcinoma the prostate.  He has been on under excellent biochemical control.  He was recently admitted to [facility] with multiple syncopal episodes.  He was found to have an evaluation of his brain and head and neck region to have a 4.3 x 2.5 cm ovoid soft tissue focus at the right level 2 3 stations largely reflecting enlarged lymph nodes.  He also had abnormal soft tissue surrounding the mid to distal cervical right ICA with resultant mild vessel narrowing.  MRI of his head and neck and brain showed a constellation of findings most compatible with a roughly 2.4 cm right tonsillar carcinoma with associated right level 2 3 nodes.  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  P16 is pending.  He is really having no dysphagia at this time no head and neck pain.  I been asked to evaluate the patient.  PLANNED TREATMENT REGIMEN: Concurrent chemoradiation  PAST MEDICAL HISTORY:  has a past medical history of COPD (chronic obstructive pulmonary disease) ([place]),  GERD (gastroesophageal reflux disease), Gout, Hypertension, Multilevel degenerative disc disease, Pneumonia (11/29/2017), Sleep apnea, Tachycardia, and Wears dentures.    PAST SURGICAL HISTORY:  Past Surgical History:  Procedure Laterality Date   ABCESS DRAINAGE  2009   tonsil   COLONOSCOPY WITH PROPOFOL N/A 02/11/2018   Procedure: COLONOSCOPY WITH PROPOFOL;  Surgeon: Toledo, Benay Pike, MD;  Location: [facility];  Service: Endoscopy;  Laterality: N/A;   ESOPHAGOGASTRODUODENOSCOPY (EGD) WITH PROPOFOL N/A 02/11/2018   Procedure: ESOPHAGOGASTRODUODENOSCOPY (EGD) WITH PROPOFOL;  Surgeon: Toledo, Benay Pike, MD;  Location: [facility] ENDOSCOPY;  Service: Endoscopy;  Laterality: N/A;   MASS EXCISION Right 08/14/2017   Procedure: EXCISION RIGHT TEMPORAL MASS SUBMENTAL MASS;  Surgeon: Carloyn Manner, MD;  Location: [place];  Service: ENT;  Laterality: Right;  sleep apnea   RADIOACTIVE SEED IMPLANT N/A 01/26/2019   Procedure: RADIOACTIVE SEED IMPLANT/BRACHYTHERAPY IMPLANT;  Surgeon: Billey Co, MD;  Location: [facility] ORS;  Service: Urology;  Laterality: N/A;   SEPTOPLASTY  2016   [place], [place]   VOLUME STUDY N/A 12/29/2018   Procedure: VOLUME STUDY;  Surgeon: Billey Co, MD;  Location: [facility] OR[facility];  Service: Urology;  Laterality: N/A;    FAMILY HISTORY: family history includes Anxiety disorder in his mother; Asthma in his sister; Diabetes in his sister; Gout in his brother; Hypertension in his brother and sister; Other in his brother.  SOCIAL HISTORY:  reports that he has been smoking cigarettes. He has a 30.00 pack-year smoking history. He quit smokeless tobacco use about 37 years ago.  His smokeless  tobacco use included snuff and chew. He reports current alcohol use of about 12.0 standard drinks of alcohol per week. He reports that he does not currently use drugs.  ALLERGIES: Chocolate, Eggs or egg-derived products, Milk (cow), Benadryl [diphenhydramine], and Milk-related  compounds  MEDICATIONS:  Current Facility-Administered Medications  Medication Dose Route Frequency Provider Last Rate Last Admin   acetaminophen (TYLENOL) tablet 650 mg  650 mg Oral Q6H PRN Para Skeans, MD       Or   acetaminophen (TYLENOL) suppository 650 mg  650 mg Rectal Q6H PRN Para Skeans, MD       albuterol (PROVENTIL) (2.5 MG/3ML) 0.083% nebulizer solution 2.5 mg  2.5 mg Inhalation Q6H PRN Para Skeans, MD       allopurinol (ZYLOPRIM) tablet 300 mg  300 mg Oral Daily Florina Ou V, MD   300 mg at 03/01/22 4193   aspirin EC tablet 81 mg  81 mg Oral Daily Para Skeans, MD   81 mg at 03/01/22 0942   heparin injection 5,000 Units  5,000 Units Subcutaneous Q8H Florina Ou V, MD   5,000 Units at 03/01/22 0520   morphine (PF) 2 MG/ML injection 2 mg  2 mg Intravenous Q4H PRN Para Skeans, MD   2 mg at 03/01/22 1209   nicotine (NICODERM CQ - dosed in mg/24 hours) patch 21 mg  21 mg Transdermal Daily Para Skeans, MD       pantoprazole (PROTONIX) EC tablet 40 mg  40 mg Oral Daily Florina Ou V, MD   40 mg at 03/01/22 0942   rosuvastatin (CRESTOR) tablet 20 mg  20 mg Oral Daily Florina Ou V, MD   20 mg at 03/01/22 0942   sodium chloride flush (NS) 0.9 % injection 3 mL  3 mL Intravenous Q12H Florina Ou V, MD   3 mL at 03/01/22 0944   thiamine (VITAMIN B1) tablet 100 mg  100 mg Oral Daily Wyvonnia Dusky, MD   100 mg at 03/01/22 0943    ECOG PERFORMANCE STATUS:  0 - Asymptomatic  REVIEW OF SYSTEMS: Syncopal episodes as stated above prostate cancer as stated above Patient denies any weight loss, fatigue, weakness, fever, chills or night sweats. Patient denies any loss of vision, blurred vision. Patient denies any ringing  of the ears or hearing loss. No irregular heartbeat. Patient denies heart murmur or history of fainting. Patient denies any chest pain or pain radiating to her upper extremities. Patient denies any shortness of breath, difficulty breathing at night, cough or  hemoptysis. Patient denies any swelling in the lower legs. Patient denies any nausea vomiting, vomiting of blood, or coffee ground material in the vomitus. Patient denies any stomach pain. Patient states has had normal bowel movements no significant constipation or diarrhea. Patient denies any dysuria, hematuria or significant nocturia. Patient denies any problems walking, swelling in the joints or loss of balance. Patient denies any skin changes, loss of hair or loss of weight. Patient denies any excessive worrying or anxiety or significant depression. Patient denies any problems with insomnia. Patient denies excessive thirst, polyuria, polydipsia. Patient denies any swollen glands, patient denies easy bruising or easy bleeding. Patient denies any recent infections, allergies or URI. Patient "s visual fields have not changed significantly in recent time.   PHYSICAL EXAM: BP 127/65 (BP Location: Right Arm)   Pulse 64   Temp 98.6 F (37 C)   Resp 17   Ht '5\' 9"'$  (1.753 m)  Wt 191 lb 2.2 oz (86.7 kg)   SpO2 95%   BMI 28.23 kg/m  He does have fullness in his right neck.  Also some shotty nodes in his left neck.  No supraclavicular adenopathy.  Well-developed well-nourished patient in NAD. HEENT reveals PERLA, EOMI, discs not visualized.  Oral cavity is clear. No oral mucosal lesions are identified. Neck is clear without evidence of cervical or supraclavicular adenopathy. Lungs are clear to A&P. Cardiac examination is essentially unremarkable with regular rate and rhythm without murmur rub or thrill. Abdomen is benign with no organomegaly or masses noted. Motor sensory and DTR levels are equal and symmetric in the upper and lower extremities. Cranial nerves II through XII are grossly intact. Proprioception is intact. No peripheral adenopathy or edema is identified. No motor or sensory levels are noted. Crude visual fields are within normal range.  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  Based on the fact this is probably locally advanced squamous cell carcinoma the tonsillar region would offer IMRT radiation therapy with concurrent chemotherapy.  Would plan on delivering 70 Gray over 7 weeks.  Would treat also his hypermetabolic nodes to high-dose and remaining lymph nodes in the head and neck region to 54 Gray using IMRT dose painting technique.  Risks and benefits of treatment including loss of taste fatigue alteration of blood counts oral mucositis skin reaction all reviewed in detail with the patient.  He comprehends her treatment plan well.  I have personally given emesis simulation appointment for next week.  Hopefully will have PET CT scan performed by then for help in our treatment field assessment.  Patient comprehends my recommendations well.  I would like to take this opportunity to thank you for allowing me to participate in the care of your patient.Noreene Filbert, MD

## 2022-03-01 NOTE — Discharge Summary (Addendum)
Physician Discharge Summary  Clarence Skinner ZOX:[id number] DOB: 30-Jan-1961 DOA: 02/26/2022  PCP: Mechele Claude, FNP  Admit date: 02/26/2022 Discharge date: 03/01/2022  Admitted From: home  Disposition:  home   Recommendations for Outpatient Follow-up:  Follow up with PCP in 1-2 weeks F/u w/ onco, Dr. Rhona Raider, in 1-2 weeks. F/u w/ radiation oncology as per [place]: no  Equipment/Devices:  Discharge Condition: stable  CODE STATUS: full  Diet recommendation: Heart Healthy  Brief/Interim Summary: HPI was taken from Dr. Florina Ou: Clarence Skinner is a 61 y.o. male seen in ed with complaints of : Pt coming for passing out . Pt was in courtroom , he felt nauseated, diaphoresis and had to vomit in courthouse. He went out and sat down and felt like he was passing out. Pt is poor historian.  This episode  duration unknown. Has been having similar episodes in past - last week similar episodes. Pt gets irate when providing details of episode.  First time it happened was in July 2023 when he was at [facility] for [facility] elbow.  Pt has monitor for one week.  Pt has seen Dr.Agbor-Etang. - [facility].  Pt has not seen anybody else for this episode.  Pt denies any other symptoms except for neck pain started about for several years - left side.  ENT appt on next Wednesday for neck LAD. Pt has past medical history of tobacco abuse, alcohol use ? ,  COPD, GERD, hypertension, sleep apnea. HPI is difficult to obtain but patient does report melena intermittently when he drinks heavy and when asked about the last time he drank he says he does not remember. EDMD discussed with neurology on-call about imaging and recommendations and was told to obtain an MRI of the neck.  As per Dr. Jimmye Norman 8/9-8/10/23: Pt was found to have a tonsillar mass and had US guided lymph node biopsy by IR on 02/28/22. 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 Pt is current  smoker of 1ppd since pt was in his 25s as per pt. Onco and radiation oncology evaluated the pt while inpatient.    Discharge Diagnoses:  Principal Problem:   Syncope Active Problems:   Tonsillar mass   Essential hypertension   Obstructive sleep apnea   Chronic obstructive pulmonary disease (HCC)   Anemia   Overweight (BMI 25.0-29.9)   Mass of right side of neck  Syncope: likely secondary to hypotension & bradycardia. Echo shows EF 81-19%, normal diastolic function, no regional wall motion abnormalities, mild-mod MR, mod TR & no atrial level shunt detected. Resolved    Tonsillar mass: s/p US guided biopsy of right neck lymph node 02/28/22. Prelim path shows squamous cell carcinoma.   Smoker: received smoking cessation counseling x 5 mins. Nicotine patch to prevent w/drawl   HTN: restart home dose of amlodipine-benazepril   OSA: CPAP qhs   Microcytic anemia: possibly secondary to tonsillar carcinoma. No need for a transfusion currently. Iron is WNL   COPD: w/o exacerbation. Continue on bronchodilators    Overweight: BMI 27.4. Would benefit from weight loss   Discharge Instructions  Discharge Instructions     Diet - low sodium heart healthy   Complete by: As directed    Discharge instructions   Complete by: As directed    F/u w/ PCP in 1-2 weeks. F/u w/ oncology, Dr. Rhona Raider, in 1-2 weeks   Increase activity slowly   Complete by: As directed  Allergies as of 03/01/2022       Reactions   Chocolate Itching, Nausea Only, Shortness Of Breath   (large amounts) Other reaction(s): Nausea, Vomiting, Shortness of Breath / Dyspnea   Eggs Or Egg-derived Products Itching, Nausea And Vomiting, Nausea Only, Shortness Of Breath   Milk (cow) Shortness Of Breath   Benadryl [diphenhydramine] Other (See Comments)   Altered mental status "makes me crazy and figety"   Milk-related Compounds Nausea And Vomiting        Medication List     STOP taking these medications     ibuprofen 800 MG tablet Commonly known as: ADVIL       TAKE these medications    albuterol 108 (90 Base) MCG/ACT inhaler Commonly known as: VENTOLIN HFA Inhale into the lungs every 6 (six) hours as needed for wheezing or shortness of breath.   allopurinol 300 MG tablet Commonly known as: ZYLOPRIM Take 300 mg by mouth daily.   amLODipine-benazepril 10-40 MG capsule Commonly known as: LOTREL Take 1 capsule by mouth daily.   aspirin EC 81 MG tablet Take 1 tablet (81 mg total) by mouth daily. Swallow whole.   Azelastine HCl 137 MCG/SPRAY Soln Place 1 spray into the nose daily as needed (allergies).   baclofen 10 MG tablet Commonly known as: LIORESAL Take 10 mg by mouth 2 (two) times daily as needed.   benzonatate 100 MG capsule Commonly known as: TESSALON Take 100 mg by mouth every 8 (eight) hours as needed.   ferrous sulfate 325 (65 FE) MG tablet Take 325 mg by mouth daily.   fluticasone 50 MCG/ACT nasal spray Commonly known as: FLONASE Place into both nostrils daily as needed for allergies or rhinitis.   furosemide 20 MG tablet Commonly known as: LASIX Take 20 mg by mouth daily.   loratadine 10 MG tablet Commonly known as: CLARITIN Take 10 mg by mouth daily.   meloxicam 7.5 MG tablet Commonly known as: MOBIC Take 7.5 mg by mouth daily as needed for pain.   pantoprazole 40 MG tablet Commonly known as: PROTONIX Take 40 mg by mouth daily.   rosuvastatin 20 MG tablet Commonly known as: CRESTOR Take 1 tablet (20 mg total) by mouth daily.   Symbicort 80-4.5 MCG/ACT inhaler Generic drug: budesonide-formoterol Inhale 1 puff into the lungs 2 (two) times daily.   tadalafil 20 MG tablet Commonly known as: CIALIS Take 1 tablet (20 mg total) by mouth daily.   Trelegy Ellipta 100-62.5-25 MCG/ACT Aepb Generic drug: Fluticasone-Umeclidin-Vilant Trelegy Ellipta 100-62.5-25 MCG/INH Inhalation Aerosol Powder Breath Activated QTY: 90 each Days: 90 Refills: 1   Written: 11/02/20 Patient Instructions: One puff once a day   Vitamin D (Ergocalciferol) 1.25 MG (50000 UNIT) Caps capsule Commonly known as: DRISDOL Take 50,000 Units by mouth once a week.        Allergies  Allergen Reactions   Chocolate Itching, Nausea Only and Shortness Of Breath    (large amounts) Other reaction(s): Nausea, Vomiting, Shortness of Breath / Dyspnea   Eggs Or Egg-Derived Products Itching, Nausea And Vomiting, Nausea Only and Shortness Of Breath   Milk (Cow) Shortness Of Breath   Benadryl [Diphenhydramine] Other (See Comments)    Altered mental status "makes me crazy and figety"   Milk-Related Compounds Nausea And Vomiting    Consultations: Onco Radiation onco    Procedures/Studies: [place] CORE BIOPSY (LYMPH NODES)  Result Date: 02/28/2022 INDICATION: Tonsillar mass and right neck lymphadenopathy EXAM: Ultrasound-guided biopsy of right neck lymph node MEDICATIONS: None. COMPLICATIONS: None  immediate. PROCEDURE: Informed written consent was obtained from the patient after a thorough discussion of the procedural risks, benefits and alternatives. All questions were addressed. Maximal Sterile Barrier Technique was utilized including caps, mask, sterile gowns, sterile gloves, sterile drape, hand hygiene and skin antiseptic. A timeout was performed prior to the initiation of the procedure. Patient position supine on the ultrasound table. Right neck skin prepped and draped in usual sterile fashion. Following local lidocaine administration, four 18 gauge cores were obtained from the enlarged right neck lymph node utilizing continuous ultrasound guidance. Samples were sent to pathology in sterile saline. Needle removed and hemostasis achieved with 1 minutes of manual compression. Post procedure ultrasound images showed no evidence of significant hemorrhage. IMPRESSION: Ultrasound-guided biopsy of enlarged right neck lymph node. Electronically Signed   By: Miachel Roux M.D.   On:  02/28/2022 15:43   ECHOCARDIOGRAM COMPLETE BUBBLE STUDY  Result Date: 02/28/2022    ECHOCARDIOGRAM REPORT   Patient Name:   [facility] Date of Exam: 02/28/2022 Medical Rec #:  [id number]      Height:       69.0 in Accession #:    [id number]     Weight:       185.6 lb Date of Birth:  Dec 16, 1960      BSA:          2.002 m Patient Age:    62 years       BP:           102/52 mmHg Patient Gender: M              HR:           62 bpm. Exam Location:  [facility] Procedure: 2D Echo, Cardiac Doppler, Color Doppler and Saline Contrast Bubble            Study Indications:     Syncope 780.2 / R55  History:         Patient has no prior history of Echocardiogram examinations.                  COPD; Risk Factors:Hypertension.  Sonographer:     Sherrie Sport Referring Phys:  [place] Diagnosing Phys: Serafina Royals MD  Sonographer Comments: Suboptimal apical window. IMPRESSIONS  1. Left ventricular ejection fraction, by estimation, is 60 to 65%. The left ventricle has normal function. The left ventricle has no regional wall motion abnormalities. Left ventricular diastolic parameters were normal.  2. Right ventricular systolic function is normal. The right ventricular size is normal.  3. The mitral valve is normal in structure. Mild to moderate mitral valve regurgitation.  4. Tricuspid valve regurgitation is moderate.  5. The aortic valve is normal in structure. Aortic valve regurgitation is not visualized. FINDINGS  Left Ventricle: Left ventricular ejection fraction, by estimation, is 60 to 65%. The left ventricle has normal function. The left ventricle has no regional wall motion abnormalities. The left ventricular internal cavity size was normal in size. There is  no left ventricular hypertrophy. Left ventricular diastolic parameters were normal. Right Ventricle: The right ventricular size is normal. No increase in right ventricular wall thickness. Right ventricular systolic function is normal. Left Atrium: Left atrial size  was normal in size. Right Atrium: Right atrial size was normal in size. Pericardium: There is no evidence of pericardial effusion. Mitral Valve: The mitral valve is normal in structure. Mild to moderate mitral valve regurgitation. Tricuspid Valve: The tricuspid valve is normal in structure. Tricuspid valve  regurgitation is moderate. Aortic Valve: The aortic valve is normal in structure. Aortic valve regurgitation is not visualized. Aortic valve mean gradient measures 2.0 mmHg. Aortic valve peak gradient measures 3.3 mmHg. Aortic valve area, by VTI measures 2.36 cm. Pulmonic Valve: The pulmonic valve was normal in structure. Pulmonic valve regurgitation is not visualized. Aorta: The aortic root and ascending aorta are structurally normal, with no evidence of dilitation. IAS/Shunts: No atrial level shunt detected by color flow Doppler. Agitated saline contrast was given intravenously to evaluate for intracardiac shunting.  LEFT VENTRICLE PLAX 2D LVIDd:         4.60 cm   Diastology LVIDs:         3.20 cm   LV e' medial:    10.40 cm/s LV PW:         1.10 cm   LV E/e' medial:  4.2 LV IVS:        0.90 cm   LV e' lateral:   11.40 cm/s LVOT diam:     2.00 cm   LV E/e' lateral: 3.9 LV SV:         46 LV SV Index:   23 LVOT Area:     3.14 cm  RIGHT VENTRICLE RV Basal diam:  3.90 cm RV S prime:     12.90 cm/s TAPSE (M-mode): 2.0 cm LEFT ATRIUM             Index        RIGHT ATRIUM           Index LA diam:        3.00 cm 1.50 cm/m   RA Area:     18.60 cm LA Vol (A2C):   45.3 ml 22.63 ml/m  RA Volume:   55.60 ml  27.78 ml/m LA Vol (A4C):   38.4 ml 19.19 ml/m LA Biplane Vol: 44.3 ml 22.13 ml/m  AORTIC VALVE AV Area (Vmax):    2.02 cm AV Area (Vmean):   2.20 cm AV Area (VTI):     2.36 cm AV Vmax:           91.10 cm/s AV Vmean:          58.750 cm/s AV VTI:            0.194 m AV Peak Grad:      3.3 mmHg AV Mean Grad:      2.0 mmHg LVOT Vmax:         58.70 cm/s LVOT Vmean:        41.100 cm/s LVOT VTI:          0.146 m  LVOT/AV VTI ratio: 0.75  AORTA Ao Root diam: 2.90 cm MITRAL VALVE               TRICUSPID VALVE MV Area (PHT): 2.82 cm    TR Peak grad:   27.2 mmHg MV Decel Time: 269 msec    TR Vmax:        261.00 cm/s MV E velocity: 44.10 cm/s MV A velocity: 97.30 cm/s  SHUNTS MV E/A ratio:  0.45        Systemic VTI:  0.15 m                            Systemic Diam: 2.00 cm Serafina Royals MD Electronically signed by Serafina Royals MD Signature Date/Time: 02/28/2022/12:51:18 PM    Final    US Carotid Bilateral  Result Date: 02/27/2022  CLINICAL DATA:  61 year old male with a history of syncope EXAM: BILATERAL CAROTID DUPLEX ULTRASOUND TECHNIQUE: Pearline Cables scale imaging, color Doppler and duplex ultrasound were performed of bilateral carotid and vertebral arteries in the neck. COMPARISON:  Neuro imaging of the same day and 1 day prior FINDINGS: Criteria: Quantification of carotid stenosis is based on velocity parameters that correlate the residual internal carotid diameter with NASCET-based stenosis levels, using the diameter of the distal internal carotid lumen as the denominator for stenosis measurement. The following velocity measurements were obtained: RIGHT ICA:  Systolic 509 cm/sec, Diastolic not recorded CCA:  57 cm/sec SYSTOLIC ICA/CCA RATIO:  2.0 ECA:  149 cm/sec LEFT ICA:  Systolic 62 cm/sec, Diastolic not recorded CCA:  48 cm/sec SYSTOLIC ICA/CCA RATIO:  1.3 ECA:  118 cm/sec Right Brachial SBP: Not acquired Left Brachial SBP: Not acquired RIGHT CAROTID ARTERY: No significant calcifications of the right common carotid artery. Intermediate waveform maintained. Heterogeneous and partially calcified plaque at the right carotid bifurcation. No significant lumen shadowing. Low resistance waveform of the right ICA. No significant tortuosity. RIGHT VERTEBRAL ARTERY: Antegrade flow with low resistance waveform. LEFT CAROTID ARTERY: No significant calcifications of the left common carotid artery. Intermediate waveform maintained.  Heterogeneous and partially calcified plaque at the left carotid bifurcation without significant lumen shadowing. Low resistance waveform of the left ICA. No significant tortuosity. LEFT VERTEBRAL ARTERY:  Antegrade flow with low resistance waveform. Additional: Incidental imaging of pathologic lymphadenopathy of the right neck, better characterized on recent cross-sectional neuro imaging. IMPRESSION: Color duplex indicates minimal heterogeneous and calcified plaque, with no hemodynamically significant stenosis by duplex criteria in the extracranial cerebrovascular circulation. Pathologic lymphadenopathy of the neck better characterized on recent neuro imaging. Signed, Dulcy Fanny. Nadene Rubins, RPVI [facility] Electronically Signed   By: Corrie Mckusick D.O.   On: 02/27/2022 14:02   MR NECK SOFT TISSUE ONLY W WO CONTRAST  Result Date: 02/27/2022 CLINICAL DATA:  61 year old male with dizziness. Neck pain. Right neck level 2/3 soft tissue mass on CTA neck yesterday thought to be abnormal lymph nodes. EXAM: MRI OF THE NECK WITH CONTRAST TECHNIQUE: Multiplanar, multisequence MR imaging was performed following the administration of intravenous contrast. CONTRAST:  76m GADAVIST GADOBUTROL 1 MMOL/ML IV SOLN COMPARISON:  CTA head and neck yesterday. Brain MRI today reported separately. FINDINGS: Pharynx and larynx: Larynx is within normal limits. Vallecula and hypopharynx within normal limits. Asymmetric although fairly subtle 15 mm area of masslike T2 hyperintensity and homogeneous enhancement along the posterior and lateral wall of the oropharynx on the right (series 12, image 11 and series 18, image 11). This area has abnormal diffusion on coronal brain DWI today. This is just below the soft palate and uvula which appear to remain normal. The mass is up to 2.4 cm long axis on series 14, image 12. Nasopharynx contour is within normal limits. Midline  retropharyngeal space and bilateral parapharyngeal spaces are within normal limits. Salivary glands: Negative except for mild mass effect on the right submandibular gland, see nodal findings below. Thyroid: Negative. Lymph nodes: Heterogeneous T2 hyperintense and enhancing soft tissue mass posterior to the right carotid space at the junction of the right level 2 B and 3B nodal station is 23 x 34 x 45 mm (AP by transverse by CC). This is inseparable from the posterior carotid space. And a 2nd smaller area of similar asymmetric T2 heterogeneous soft tissue and enhancement just below the skull base (series 12, image 6 and also  series 13, image 14) is roughly 16 x 14 x 30 mm (AP by transverse by CC) and mildly narrow due the adjacent right ICA on the CTA yesterday. Burtis Junes this is malignant nodal versus ex nodal disease. No other cervical lymphadenopathy identified. Vascular: Maintained vascular flow voids in the neck. See CTA findings yesterday. Limited intracranial: Brain MRI today reported separately. Visualized orbits: Not included. Mastoids and visualized paranasal sinuses: Well aerated. Skeleton: Cervical spine degeneration including disc disease and degenerative appearing anterolisthesis of C3 on C4 with facet degeneration. No osseous metastatic disease identified. No cervical spine dural thickening. Cervical spinal cord appears to be normal. Upper chest: Better demonstrated on CTA yesterday. 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 Recommend ENT consultation. Electronically Signed   By: Genevie Ann M.D.   On: 02/27/2022 08:32   MR BRAIN WO CONTRAST  Result Date: 02/27/2022 CLINICAL DATA:  61 year old male with syncope, dizziness. EXAM: MRI HEAD WITHOUT CONTRAST TECHNIQUE: Multiplanar, multiecho pulse sequences of the brain and  surrounding structures were obtained without intravenous contrast. COMPARISON:  CTA head and neck yesterday. FINDINGS: Brain: No restricted diffusion to suggest acute infarction. No midline shift, mass effect, evidence of mass lesion, ventriculomegaly, extra-axial collection or acute intracranial hemorrhage. Cervicomedullary junction and pituitary are within normal limits. Patchy, widely scattered bilateral cerebral white matter T2 and FLAIR hyperintensity. This is in a nonspecific pattern. No cortical encephalomalacia identified. In the subcortical white matter of the left superior frontal gyrus posteriorly there is a small T2 heterogeneous 6-7 mm lesion (series 10, image 19) with pronounced hemosiderin on SWI (series 13, image 42). No regional edema or mass effect. No other chronic cerebral blood products. Deep gray nuclei, brainstem and cerebellum are within normal limits. Vascular: Major intracranial vascular flow voids are preserved. Skull and upper cervical spine: Negative visible cervical spine. Visualized bone marrow signal is within normal limits. Sinuses/Orbits: Negative orbits. Paranasal sinuses and mastoids are stable and well aerated. Other: There is is 3 cm segment of abnormal diffusion in the right upper neck just below the skull base (series 7, image 22. And similar asymmetric diffusion along the right posterior pharynx series 7, image 23. See dedicated Neck MRI today reported separately. IMPRESSION: 1. No acute intracranial abnormality. Abnormal right neck soft tissues. See dedicated Neck MRI today reported separately. 2. 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 These are slow flow vascular malformations which are generally asymptomatic. 3. Other moderately advanced but nonspecific cerebral white matter signal changes. Electronically Signed   By: Genevie Ann M.D.   On: 02/27/2022 08:22   CT ANGIO HEAD NECK W WO CM  Result  Date: 02/26/2022 CLINICAL DATA:  Provided history: Dizziness, persistent/recurrent, cardiac or vascular cause suspected; recurrent syncope, neck pain, severe. EXAM: CT ANGIOGRAPHY HEAD AND NECK TECHNIQUE: Multidetector CT imaging of the head and neck was performed using the standard protocol during bolus administration of intravenous contrast. Multiplanar CT image reconstructions and MIPs were obtained to evaluate the vascular anatomy. Carotid stenosis measurements (when applicable) are obtained utilizing NASCET criteria, using the distal internal carotid diameter as the denominator. RADIATION DOSE REDUCTION: This exam was performed according to the departmental dose-optimization program which includes automated exposure control, adjustment of the mA and/or kV according to patient size and/or use of iterative reconstruction technique. CONTRAST:  66m OMNIPAQUE IOHEXOL 350 MG/ML SOLN COMPARISON:  Head CT 06/18/2011. Report from brain MRI 05/12/1996 (images unavailable). FINDINGS: CT HEAD FINDINGS Brain: Mild generalized parenchymal atrophy. Mild patchy and ill-defined hypoattenuation within the cerebral white matter, nonspecific but most often secondary to chronic small vessel ischemia. There is no acute intracranial hemorrhage. No demarcated cortical infarct. No extra-axial fluid collection. No evidence of an intracranial mass. No midline shift. Vascular: No hyperdense vessel. Atherosclerotic calcifications. Skull: No fracture or aggressive osseous lesion. Sinuses/Orbits: No orbital mass or acute orbital finding. Mild mucosal thickening within the right frontal, bilateral ethmoid and bilateral maxillary sinuses. Review of the MIP images confirms the above findings CTA NECK FINDINGS Aortic arch: Standard aortic branching. Atherosclerotic plaque within the visualized aortic arch and proximal major branch vessels of the neck. Streak and beam hardening artifact arising from a dense left-sided contrast bolus partially  obscures the left subclavian artery. Within this limitation, there is no appreciable hemodynamically significant innominate or proximal subclavian artery stenosis. Right carotid system: CCA and ICA patent within the neck. Atherosclerotic plaque within the proximal ICA, resulting in less than 50% stenosis. There is abnormal soft tissue surrounding the mid-to-distal cervical ICA with associated mild narrowing of the ICA at these levels. Left carotid system: CCA and ICA patent within the neck without stenosis. Mild atherosclerotic plaque at the CCA origin and about the carotid bifurcation. Vertebral arteries: Vertebral arteries patent within the neck. Mild to moderate atherosclerotic narrowing at the origin of the right vertebral artery. Calcified plaque at the origin of the left vertebral artery with no more than mild stenosis. Skeleton: Reversal of the expected cervical lordosis. Cervical spondylosis. No acute fracture or aggressive osseous lesion. Other neck: 4.3 x 2.5 cm ovoid soft tissue focus at the right level II/level III stations (along the posterior aspect of the proximal ICA, carotid bifurcation and distal CCA). This likely reflects an enlarged lymph node (for instance as seen on series 10, image 223) (series 13, image 90). Subcentimeter thyroid nodules, not meeting consensus criteria for ultrasound follow-up based on size. Reference: J Am Coll Radiol. 2015 Feb;12(2): 143-50. Upper chest: Centrilobular and paraseptal emphysema. No consolidation within the imaged lung apices. Review of the MIP images confirms the above findings CTA HEAD FINDINGS Anterior circulation: The intracranial internal carotid arteries are patent. The M1 middle cerebral arteries are patent. No M2 proximal branch occlusion or high-grade proximal stenosis is identified. Atherosclerotic irregularity of the M2 and more distal MCA vessels, bilaterally. The anterior cerebral arteries are patent. No intracranial aneurysm is identified.  Posterior circulation: The intracranial vertebral arteries are patent. The basilar artery is patent. The posterior cerebral arteries are patent. A left posterior communicating artery is present. The right posterior communicating artery is diminutive or absent. Venous sinuses: Within the limitations of contrast timing, no convincing thrombus. Anatomic variants: As described. Review of the MIP images confirms the above findings IMPRESSION: CT head: 1. No evidence of acute intracranial abnormality. 2. Mild patchy and ill-defined hypoattenuation within the cerebral white matter, nonspecific but most often secondary to chronic small vessel ischemia. 3. Mild paranasal sinus mucosal thickening. CTA neck: 1. 4.3 x 2.5 cm ovoid soft tissue focus at the right level II/III stations (along the posterior aspect of the proximal ICA, carotid bifurcation and distal CCA). This likely reflects an enlarged lymph node. Given the size, this is concerning for nodal metastatic disease, although a reactive/inflammatory lymph node is possible. 2. Abnormal soft tissue surrounding the mid-to-distal cervical right ICA with resultant mild vessel narrowing.  Findings are nonspecific, but may reflect a nodal mass or sequela of a vasculopathy. A contrast-enhanced neck CT is recommended for initial further evaluation of this finding, and for initial further evaluation of the finding described in impression #1. 3. Atherosclerotic plaque within the bilateral common carotid and cervical internal carotid arteries without hemodynamically significant stenosis. 4. Vertebral arteries patent within the neck. Mild-to-moderate atherosclerotic narrowing at the origin of the right vertebral artery. No more than mild atherosclerotic stenosis at the origin of the left vertebral artery. 5. Aortic Atherosclerosis (ICD10-I70.0) and Emphysema (ICD10-J43.9). 6. Cervical spondylosis. CTA head: Intracranial atherosclerotic disease without intracranial large vessel  occlusion or proximal high-grade arterial stenosis. Electronically Signed   By: Kellie Simmering D.O.   On: 02/26/2022 17:13   DG Chest Portable 1 View  Result Date: 02/26/2022 CLINICAL DATA:  Syncope EXAM: PORTABLE CHEST 1 VIEW COMPARISON:  February 13, 2022, April 07, 2018 FINDINGS: The heart size and mediastinal contours are within normal limits. Both lungs are clear. The visualized skeletal structures are unremarkable. IMPRESSION: No acute cardiopulmonary abnormality. Electronically Signed   By: Beryle Flock M.D.   On: 02/26/2022 15:37   DG Chest Port 1 View  Result Date: 02/13/2022 CLINICAL DATA:  Chest pain bradycardia EXAM: PORTABLE CHEST 1 VIEW COMPARISON:  05/28/2011, 11/29/2017 FINDINGS: The heart size and mediastinal contours are within normal limits. Both lungs are clear. The visualized skeletal structures are unremarkable. IMPRESSION: No active disease. Electronically Signed   By: Donavan Foil M.D.   On: 02/13/2022 20:41   (Echo, Carotid, EGD, Colonoscopy, ERCP)    Subjective: Pt denies any complaints    Discharge Exam: Vitals:   03/01/22 0807 03/01/22 1145  BP: (!) 149/71 127/65  Pulse: (!) 47 64  Resp: 17 17  Temp: 98.1 F (36.7 C) 98.6 F (37 C)  SpO2: 100% 95%   Vitals:   03/01/22 0500 03/01/22 0538 03/01/22 0807 03/01/22 1145  BP:  (!) 143/77 (!) 149/71 127/65  Pulse:  (!) 57 (!) 47 64  Resp:  '18 17 17  '$ Temp:  97.9 F (36.6 C) 98.1 F (36.7 C) 98.6 F (37 C)  TempSrc:  Oral    SpO2:  94% 100% 95%  Weight: 86.7 kg     Height:        General: Pt is alert, awake, not in acute distress Cardiovascular: S1/S2 +, no rubs, no gallops Respiratory: CTA bilaterally, no wheezing, no rhonchi Abdominal: Soft, NT, ND, bowel sounds + Extremities: no edema, no cyanosis    The results of significant diagnostics from this hospitalization (including imaging, microbiology, ancillary and laboratory) are listed below for reference.     Microbiology: No results found  for this or any previous visit (from the past 240 hour(s)).   Labs: BNP (last 3 results) Recent Labs    02/13/22 2035 02/26/22 1504  BNP 18.4 99.2   Basic Metabolic Panel: Recent Labs  Lab 02/26/22 1504 02/27/22 0446 03/01/22 0515  NA 138 137 140  K 3.3* 4.0 3.5  CL 108 112* 113*  CO2 20* 19* 24  GLUCOSE 90 111* 93  BUN '12 11 10  '$ CREATININE 1.26* 0.87 0.96  CALCIUM 8.1* 7.6* 8.1*  MG 1.9  --   --    Liver Function Tests: Recent Labs  Lab 02/26/22 1504 02/27/22 0446  AST 15 11*  ALT 9 10  ALKPHOS 54 56  BILITOT 0.5 0.5  PROT 6.6 6.1*  ALBUMIN 3.7 3.4*   No results for input(s): "LIPASE", "AMYLASE"  in the last 168 hours. No results for input(s): "AMMONIA" in the last 168 hours. CBC: Recent Labs  Lab 02/26/22 1504 02/27/22 0446 03/01/22 0515  WBC 6.9 6.4 6.6  NEUTROABS 3.6  --   --   HGB 12.7* 12.1* 11.0*  HCT 39.2 37.9* 34.7*  MCV 75.0* 75.5* 75.3*  PLT 316 263 264   Cardiac Enzymes: No results for input(s): "CKTOTAL", "CKMB", "CKMBINDEX", "TROPONINI" in the last 168 hours. BNP: Invalid input(s): "POCBNP" CBG: No results for input(s): "GLUCAP" in the last 168 hours. D-Dimer No results for input(s): "DDIMER" in the last 72 hours. Hgb A1c No results for input(s): "HGBA1C" in the last 72 hours. Lipid Profile No results for input(s): "CHOL", "HDL", "LDLCALC", "TRIG", "CHOLHDL", "LDLDIRECT" in the last 72 hours. Thyroid function studies No results for input(s): "TSH", "T4TOTAL", "T3FREE", "THYROIDAB" in the last 72 hours.  Invalid input(s): "FREET3" Anemia work up Recent Labs    03/01/22 0515  FERRITIN 114  TIBC 232*  IRON 103   Urinalysis    Component Value Date/Time   COLORURINE YELLOW (A) 02/27/2022 0322   APPEARANCEUR CLEAR (A) 02/27/2022 0322   APPEARANCEUR Clear 05/13/2013 1713   LABSPEC 1.027 02/27/2022 0322   LABSPEC 1.004 05/13/2013 1713   PHURINE 5.0 02/27/2022 0322   GLUCOSEU NEGATIVE 02/27/2022 0322   GLUCOSEU Negative  05/13/2013 1713   HGBUR SMALL (A) 02/27/2022 0322   BILIRUBINUR NEGATIVE 02/27/2022 0322   BILIRUBINUR Negative 05/13/2013 1713   KETONESUR NEGATIVE 02/27/2022 0322   PROTEINUR NEGATIVE 02/27/2022 0322   NITRITE NEGATIVE 02/27/2022 0322   LEUKOCYTESUR NEGATIVE 02/27/2022 0322   LEUKOCYTESUR Negative 05/13/2013 1713   Sepsis Labs Recent Labs  Lab 02/26/22 1504 02/27/22 0446 03/01/22 0515  WBC 6.9 6.4 6.6   Microbiology No results found for this or any previous visit (from the past 240 hour(s)).   Time coordinating discharge: Over 30 minutes  SIGNED:   Wyvonnia Dusky, MD  [facility] 03/01/2022, 1:41 PM Pager   If 7PM-7AM, please contact night-coverage [email or web address]

## 2022-03-02 ENCOUNTER — Telehealth: Payer: Self-pay

## 2022-03-02 NOTE — Telephone Encounter (Signed)
Error

## 2022-03-02 NOTE — Telephone Encounter (Signed)
-----   Message from [name], MD sent at 03/02/2022  2:10 PM EDT ----- I saw this patient during hospitalization.  He needs a follow up appt after PET scan. -MD hospital follow up.   zy

## 2022-03-02 NOTE — Telephone Encounter (Signed)
Please schedule patient for hospital follow up after his PET on 8/23. Please notify patient. Thanks

## 2022-03-05 ENCOUNTER — Telehealth: Payer: Self-pay | Admitting: Oncology

## 2022-03-05 NOTE — Telephone Encounter (Signed)
Spoke with patient to clarify some questions he had about his appts coming up. Patient verbalized understanding.

## 2022-03-05 NOTE — Telephone Encounter (Signed)
Per Dr. Tasia Catchings please move up hospital f/u to 8/25. Ok to double book. Please notify patient of change. Informed patient that Dr. Tasia Catchings might want to see him sooner than 9/6. Patient verbalized understanding.

## 2022-03-05 NOTE — Telephone Encounter (Signed)
spoke with pt with updated appt information for 8/25, pt confirmed

## 2022-03-07 DIAGNOSIS — E781 Pure hyperglyceridemia: Secondary | ICD-10-CM | POA: Diagnosis not present

## 2022-03-07 DIAGNOSIS — I10 Essential (primary) hypertension: Secondary | ICD-10-CM | POA: Diagnosis not present

## 2022-03-07 DIAGNOSIS — C4442 Squamous cell carcinoma of skin of scalp and neck: Secondary | ICD-10-CM | POA: Diagnosis not present

## 2022-03-07 DIAGNOSIS — K5900 Constipation, unspecified: Secondary | ICD-10-CM | POA: Diagnosis not present

## 2022-03-07 DIAGNOSIS — C801 Malignant (primary) neoplasm, unspecified: Secondary | ICD-10-CM | POA: Diagnosis not present

## 2022-03-08 ENCOUNTER — Other Ambulatory Visit: Payer: Medicaid Other

## 2022-03-08 ENCOUNTER — Ambulatory Visit: Payer: Medicaid Other

## 2022-03-08 NOTE — Progress Notes (Signed)
Tumor Board Documentation  Clarence Skinner was presented by Marquis Lunch, PA at our Tumor Board on 03/08/2022, which included representatives from medical oncology, pathology, radiology, pulmonology, genetics, research, navigation, internal medicine, palliative care.  Clarence Skinner currently presents as a new patient, for Clarence Skinner, for new positive pathology with history of the following treatments: active survellience, surgical intervention(s) (biopsy).  Additionally, we reviewed previous medical and familial history, history of present illness, and recent lab results along with all available histopathologic and imaging studies. The tumor board considered available treatment options and made the following recommendations: Additional screening, Concurrent chemo-radiation therapy (PET Scan)    The following procedures/referrals were also placed: No orders of the defined types were placed in this encounter.   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  Tumor board is a meeting of clinicians from various specialty areas who evaluate and discuss patients for whom a multidisciplinary approach is being considered. Final determinations in the plan of care are those of the provider(s). The responsibility for follow up of recommendations given during tumor board is that of the provider.   Today's extended care, comprehensive team conference, Clarence Skinner was not present for the discussion and was not examined.   Multidisciplinary Tumor Board is a multidisciplinary case peer review process.  Decisions discussed in the Multidisciplinary Tumor Board reflect the opinions of the specialists present at the conference without having examined the patient.  Ultimately, treatment and diagnostic decisions rest with the primary provider(s) and the  patient.

## 2022-03-14 ENCOUNTER — Ambulatory Visit
Admit: 2022-03-14 | Discharge: 2022-03-14 | Disposition: A | Discharge status: Home / Self Care | Payer: Medicaid Other | Attending: Oncology | Admitting: Oncology

## 2022-03-14 DIAGNOSIS — J358 Other chronic diseases of tonsils and adenoids: Secondary | ICD-10-CM

## 2022-03-15 ENCOUNTER — Ambulatory Visit: Payer: Medicaid Other

## 2022-03-15 ENCOUNTER — Ambulatory Visit
Admission: RE | Admit: 2022-03-15 | Discharge: 2022-03-15 | Disposition: A | Discharge status: Home / Self Care | Payer: Medicaid Other | Source: Ambulatory Visit | Attending: Oncology | Admitting: Oncology

## 2022-03-15 DIAGNOSIS — C099 Malignant neoplasm of tonsil, unspecified: Secondary | ICD-10-CM | POA: Insufficient documentation

## 2022-03-15 DIAGNOSIS — C770 Secondary and unspecified malignant neoplasm of lymph nodes of head, face and neck: Secondary | ICD-10-CM | POA: Diagnosis not present

## 2022-03-15 DIAGNOSIS — R918 Other nonspecific abnormal finding of lung field: Secondary | ICD-10-CM | POA: Diagnosis not present

## 2022-03-15 LAB — GLUCOSE, CAPILLARY: Glucose-Capillary: 100 mg/dL — ABNORMAL HIGH (ref 70–99)

## 2022-03-15 MED ORDER — FLUDEOXYGLUCOSE F - 18 (FDG) INJECTION
9.9000 | Freq: Once | INTRAVENOUS | Status: DC | PRN
Start: 2022-03-15 — End: 2022-03-19

## 2022-03-16 ENCOUNTER — Inpatient Hospital Stay: Payer: Medicaid Other | Admitting: Oncology

## 2022-03-19 ENCOUNTER — Ambulatory Visit (HOSPITAL_COMMUNITY)
Admission: RE | Admit: 2022-03-19 | Discharge: 2022-03-19 | Disposition: A | Discharge status: Home / Self Care | Payer: Medicaid Other | Source: Ambulatory Visit | Attending: Oncology | Admitting: Oncology

## 2022-03-19 ENCOUNTER — Other Ambulatory Visit: Payer: Self-pay | Admitting: Oncology

## 2022-03-19 ENCOUNTER — Encounter
Admission: RE | Admit: 2022-03-19 | Discharge: 2022-03-19 | Disposition: A | Discharge status: Home / Self Care | Payer: Medicaid Other | Source: Ambulatory Visit | Attending: Oncology | Admitting: Oncology

## 2022-03-19 VITALS — Ht 71.0 in | Wt 172.0 lb

## 2022-03-19 DIAGNOSIS — J358 Other chronic diseases of tonsils and adenoids: Secondary | ICD-10-CM | POA: Insufficient documentation

## 2022-03-19 DIAGNOSIS — C760 Malignant neoplasm of head, face and neck: Secondary | ICD-10-CM | POA: Diagnosis not present

## 2022-03-19 DIAGNOSIS — R918 Other nonspecific abnormal finding of lung field: Secondary | ICD-10-CM | POA: Insufficient documentation

## 2022-03-19 LAB — GLUCOSE, CAPILLARY
Glucose-Capillary: 127 mg/dL — ABNORMAL HIGH (ref 70–99)
Glucose-Capillary: 112 mg/dL — ABNORMAL HIGH (ref 70–99)

## 2022-03-19 MED ORDER — FLUDEOXYGLUCOSE F - 18 (FDG) INJECTION
8.6100 | Freq: Once | INTRAVENOUS | Status: AC | PRN
  Administered 2022-03-19: 8.61 via INTRAVENOUS

## 2022-03-19 MED ORDER — FLUDEOXYGLUCOSE F - 18 (FDG) INJECTION: 8.9000 | Freq: Once | INTRAVENOUS | Status: DC | PRN

## 2022-03-20 ENCOUNTER — Telehealth: Payer: Self-pay

## 2022-03-20 NOTE — Telephone Encounter (Signed)
Patient seen in office here on 02/23/22. He was wearing a cardiac monitor at that time that was ordered by his PCP. Called and left a VM on the nurses line requesting results be faxed back to our office at [phone], or a call back if there were any questions or concerns.

## 2022-03-21 DIAGNOSIS — C099 Malignant neoplasm of tonsil, unspecified: Secondary | ICD-10-CM | POA: Diagnosis not present

## 2022-03-21 DIAGNOSIS — C770 Secondary and unspecified malignant neoplasm of lymph nodes of head, face and neck: Secondary | ICD-10-CM | POA: Diagnosis not present

## 2022-03-21 NOTE — Telephone Encounter (Signed)
Called and left a 2nd VM on the nurses line requesting monitor results be faxed and a call back to confirm.

## 2022-03-22 ENCOUNTER — Inpatient Hospital Stay: Payer: Medicaid Other | Attending: Oncology | Admitting: Oncology

## 2022-03-22 ENCOUNTER — Ambulatory Visit
Admission: RE | Admit: 2022-03-22 | Discharge: 2022-03-22 | Disposition: A | Discharge status: Home / Self Care | Payer: Medicaid Other | Source: Ambulatory Visit | Attending: Radiation Oncology | Admitting: Radiation Oncology

## 2022-03-22 ENCOUNTER — Encounter: Payer: Self-pay | Admitting: Oncology

## 2022-03-22 VITALS — BP 129/72 | HR 78 | Temp 96.4°F | Resp 18 | Wt 168.7 lb

## 2022-03-22 DIAGNOSIS — G9001 Carotid sinus syncope: Secondary | ICD-10-CM | POA: Diagnosis not present

## 2022-03-22 DIAGNOSIS — Z7982 Long term (current) use of aspirin: Secondary | ICD-10-CM | POA: Insufficient documentation

## 2022-03-22 DIAGNOSIS — Z79899 Other long term (current) drug therapy: Secondary | ICD-10-CM | POA: Insufficient documentation

## 2022-03-22 DIAGNOSIS — C099 Malignant neoplasm of tonsil, unspecified: Secondary | ICD-10-CM | POA: Insufficient documentation

## 2022-03-22 DIAGNOSIS — Z7189 Other specified counseling: Secondary | ICD-10-CM | POA: Diagnosis not present

## 2022-03-22 DIAGNOSIS — C770 Secondary and unspecified malignant neoplasm of lymph nodes of head, face and neck: Secondary | ICD-10-CM | POA: Insufficient documentation

## 2022-03-22 DIAGNOSIS — M542 Cervicalgia: Secondary | ICD-10-CM | POA: Insufficient documentation

## 2022-03-22 DIAGNOSIS — F17210 Nicotine dependence, cigarettes, uncomplicated: Secondary | ICD-10-CM | POA: Diagnosis not present

## 2022-03-22 MED ORDER — PROCHLORPERAZINE MALEATE 10 MG PO TABS: 10.0000 mg | ORAL_TABLET | Freq: Four times a day (QID) | ORAL | 1 refills | Status: DC | PRN

## 2022-03-22 NOTE — Progress Notes (Signed)
Patient here for hospital follow up

## 2022-03-22 NOTE — Assessment & Plan Note (Signed)
Discussed with patient

## 2022-03-22 NOTE — Progress Notes (Signed)
START ON PATHWAY REGIMEN - Head and Neck     A cycle is every 7 days:     Carboplatin   **Always confirm dose/schedule in your pharmacy ordering system**  Patient Characteristics: Oropharynx, HPV Negative/Unknown, Preoperative or Nonsurgical Candidate (Clinical Staging), Stage IVA/B Disease Classification: Oropharynx HPV Status: Negative (-) Therapeutic Status: Preoperative or Nonsurgical Candidate (Clinical Staging) AJCC T Category: cT2 AJCC 8 Stage Grouping: IVA AJCC N Category: cN2b AJCC M Category: cM0 Intent of Therapy: Non-Curative / Palliative Intent, Discussed with Patient

## 2022-03-22 NOTE — Progress Notes (Signed)
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 Recommend concurrent chemotherapy with radiation. Patient has baseline hearing loss of right ear and is not wanting to further compromise his hearing significantly.  Recommend concurrent carboplatin AUC of 2 with radiation. The diagnosis and care plan were discussed with patient in detail. The goal of treatment is with curative intent.  Chemotherapy education was provided.  We had discussed the composition of chemotherapy regimen, length of chemo cycle, duration of treatment and the time to assess response to treatment.    I explained to the patient the risks and benefits of chemotherapy including all but not limited to hair loss, allergic reaction, mouth sore, nausea, vomiting, diarrhea, low blood counts, bleeding, neuropathy and risk of life threatening infection and even death, secondary malignancy etc.  . Patient voices understanding and willing to proceed chemotherapy.   # Chemotherapy education; Antiemetics-Zofran and Compazine   Goals of care, counseling/discussion Discussed with patient  Carotid sinus syndrome Encourage oral hydration increase Expect symptoms to improve with chemotherapy and radiation.  Neck pain Imaging indicates degenerative disease. Currently on Oxycodone as needed Refer to palliative care for evaluation.   Orders Placed This Encounter  Procedures   Ambulatory Referral to Palliative Care    Referral Priority:   Routine    Referral Type:   Consultation    Referral Reason:   Goals of Care    Number of Visits Requested:   1   Follow up 1 week lab MD carboplatin  All questions were  answered. The patient knows to call the clinic with any problems, questions or concerns. No barriers to learning was detected.  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 male presents to establish care for  I have reviewed his chart and materials related to his cancer extensively and collaborated history with the patient. 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 This likely reflects an enlarged lymph node.  2 Abnormal soft tissue surrounding the mid-to-distal cervical right ICA with resultant mild vessel narrowing 3 Atherosclerotic plaque within the bilateral common carotid and cervical internal carotid arteries without hemodynamically significant stenosis. 4 Vertebral arteries patent within the neck. Mild-to-moderate atherosclerotic narrowing at the origin of the right vertebral artery. No more than mild atherosclerotic stenosis at the origin of the left vertebral artery. 5 Aortic Atherosclerosis and Emphysema 6. Cervical spondylosis   02/27/2022 Imaging   MRI neck soft tissue w wo Constellation of findings most compatible with a roughly 2.4 cm Right Tonsillar Carcinoma (series 12, image 11) with associated right level 2 and level 3 malignant nodal, ex-nodal disease (series 14, image 13). Recommend ENT consultation    02/27/2022 Imaging   02/27/2022 MRI brain without contrast showed  No acute intracranial abnormality. Abnormal right neck soft tissues. See dedicated Neck MRI today reported separately. 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 These are slow flow vascular malformations which are generally asymptomatic. [name] Other moderately advanced but nonspecific cerebral white matter signal changes.   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 Metastatic squamous cell carcinoma. Moderately differentiated with focal keratinization.  p16 negative.    02/28/2022 Cancer Staging   Staging form: Pharynx - P16 Negative Oropharynx, AJCC 8th Edition - Clinical stage from 02/28/2022: Stage IVA (cT2, cN2b, cM0, p16-) - Signed by [name], MD on 03/22/2022 Stage prefix: Initial diagnosis   03/19/2022 Imaging   PET scan showed Signs of RIGHT neck malignancy with metastatic involvement just below the skull base and at level II/III also on the RIGHT. No signs of contralateral nodal involvement.   03/29/2022 -  Chemotherapy   Patient is on Treatment Plan : HEAD/NECK Carboplatin (AUC 2) q7d      Patient initially presented emergency room due to intermittent syncope/near syncope episodes, diaphoretic, nauseated. Imaging findings are concerning for right tonsil mass with level 2/level 3 lymph node involvement which contributes to syncope/near syncope episodes -carotid sinus syndrome.  INTERVAL HISTORY Clarence Skinner is a 61 y.o. male who has above history reviewed by me today presents for follow up visit for management of stage IVa right tonsil squamous cell carcinoma Patient reports feeling well today.  No additional syncope events.  Accompanied by wife. 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 + Throat discomfort,+ neck pain, shooting down to back. he currently takes oxycodone as needed-  MEDICAL HISTORY:  Past Medical History:  Diagnosis Date   COPD (chronic obstructive pulmonary disease) (HCC)    GERD (gastroesophageal reflux disease)    Gout    Hypertension    Multilevel degenerative disc disease    Pneumonia 11/29/2017    Sleep apnea    CPAP   Tachycardia    Wears dentures    partial upper and lower    SURGICAL HISTORY: Past Surgical History:  Procedure Laterality Date   ABCESS DRAINAGE  2009   tonsil   COLONOSCOPY WITH PROPOFOL N/A 02/11/2018   Procedure: COLONOSCOPY WITH PROPOFOL;  Surgeon: Toledo, Benay Pike, MD;  Location: [facility];  Service: Endoscopy;  Laterality: N/A;   ESOPHAGOGASTRODUODENOSCOPY (EGD) WITH PROPOFOL N/A 02/11/2018   Procedure: ESOPHAGOGASTRODUODENOSCOPY (EGD) WITH PROPOFOL;  Surgeon: Toledo, Benay Pike, MD;  Location: [facility];  Service: Endoscopy;  Laterality: N/A;   MASS EXCISION Right 08/14/2017   Procedure: EXCISION RIGHT TEMPORAL MASS SUBMENTAL MASS;  Surgeon: Carloyn Manner, MD;  Location: [facility];  Service: ENT;  Laterality: Right;  sleep apnea   RADIOACTIVE SEED IMPLANT N/A 01/26/2019   Procedure: RADIOACTIVE SEED IMPLANT/BRACHYTHERAPY IMPLANT;  Surgeon: Billey Co, MD;  Location: [facility];  Service: Urology;  Laterality: N/A;   SEPTOPLASTY  2016   [place]   VOLUME STUDY N/A 12/29/2018   Procedure: VOLUME STUDY;  Surgeon: Billey Co, MD;  Location: [facility];  Service: Urology;  Laterality: N/A;    SOCIAL HISTORY: Social History   Socioeconomic History   Marital status: Significant Other    Spouse name: Not on file   Number of children: Not on file   Years of education: Not on file   Highest education level: Not on file  Occupational History   Not on file  Tobacco Use   Smoking status: Every Day    Packs/day: 1.00    Years: 30.00  Total pack years: 30.00    Types: Cigarettes   Smokeless tobacco: Former    Types: Snuff, Chew    Quit date: 12/05/1984   Tobacco comments:    since age 71  Vaping Use   Vaping Use: Never used  Substance and Sexual Activity   Alcohol use: Yes    Alcohol/week: 12.0 standard drinks of alcohol    Types: 12 Cans of beer per week    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    Drug use: Not Currently   Sexual activity: Yes  Other Topics Concern   Not on file  Social History Narrative   Not on file   Social Determinants of Health   Financial Resource Strain: Not on file  Food Insecurity: Not on file  Transportation Needs: Not on file  Physical Activity: Not on file  Stress: Not on file  Social Connections: Not on file  Intimate Partner Violence: Not on file    FAMILY HISTORY: Family History  Problem Relation Age of Onset   Anxiety disorder Mother    Cancer Father    Hypertension Sister    Diabetes Sister    Asthma Sister    Other Brother        mva  at age 68   Gout Brother    Hypertension Brother    Prostate cancer Maternal Uncle    Prostate cancer Maternal Uncle     ALLERGIES:  is allergic to chocolate, eggs or egg-derived products, milk (cow), benadryl [diphenhydramine], and milk-related compounds.  MEDICATIONS:  Current Outpatient Medications  Medication Sig Dispense Refill   albuterol (PROVENTIL HFA;VENTOLIN HFA) 108 (90 Base) MCG/ACT inhaler Inhale into the lungs every 6 (six) hours as needed for wheezing or shortness of breath.     allopurinol (ZYLOPRIM) 300 MG tablet Take 300 mg by mouth daily.     amLODipine-benazepril (LOTREL) 10-40 MG capsule Take 1 capsule by mouth daily.     aspirin EC 81 MG tablet Take 1 tablet (81 mg total) by mouth daily. Swallow whole. 90 tablet 3   Azelastine HCl 137 MCG/SPRAY SOLN Place 1 spray into the nose daily as needed (allergies).      baclofen (LIORESAL) 10 MG tablet Take 10 mg by mouth 2 (two) times daily as needed.     benzonatate (TESSALON) 100 MG capsule Take 100 mg by mouth every 8 (eight) hours as needed.     ferrous sulfate 325 (65 FE) MG tablet Take 325 mg by mouth daily.  3   fluticasone (FLONASE) 50 MCG/ACT nasal spray Place into both nostrils daily as needed for allergies or rhinitis.     Fluticasone-Umeclidin-Vilant (TRELEGY ELLIPTA) 100-62.5-25 MCG/ACT AEPB Trelegy Ellipta 100-62.5-25  MCG/INH Inhalation Aerosol Powder Breath Activated QTY: 90 each Days: 90 Refills: 1  Written: 11/02/20 Patient Instructions: One puff once a day     furosemide (LASIX) 20 MG tablet Take 20 mg by mouth daily.     loratadine (CLARITIN) 10 MG tablet Take 10 mg by mouth daily.     meloxicam (MOBIC) 7.5 MG tablet Take 7.5 mg by mouth daily as needed for pain.     Oxycodone HCl 10 MG TABS Take 10 mg by mouth every 6 (six) hours as needed.     pantoprazole (PROTONIX) 40 MG tablet Take 40 mg by mouth daily.     rosuvastatin (CRESTOR) 20 MG tablet Take 1 tablet (20 mg total) by mouth daily. 30 tablet 3   SYMBICORT 80-4.5 MCG/ACT inhaler  Inhale 1 puff into the lungs 2 (two) times daily.     tadalafil (CIALIS) 20 MG tablet Take 1 tablet (20 mg total) by mouth daily. 30 tablet 11   Vitamin D, Ergocalciferol, (DRISDOL) 1.25 MG (50000 UNIT) CAPS capsule Take 50,000 Units by mouth once a week.     No current facility-administered medications for this visit.    Review of Systems  Constitutional:  Positive for fatigue. Negative for chills and fever.  HENT:   Positive for hearing loss. Negative for voice change.   Eyes:  Negative for eye problems and icterus.  Respiratory:  Negative for chest tightness, cough and shortness of breath.   Cardiovascular:  Negative for chest pain and leg swelling.  Gastrointestinal:  Negative for abdominal distention and abdominal pain.  Endocrine: Negative for hot flashes.  Genitourinary:  Negative for difficulty urinating, dysuria and frequency.   Musculoskeletal:  Positive for arthralgias.  Skin:  Negative for itching and rash.  Neurological:  Negative for light-headedness and numbness.  Hematological:  Negative for adenopathy. Does not bruise/bleed easily.  Psychiatric/Behavioral:  Negative for confusion.      PHYSICAL EXAMINATION: ECOG PERFORMANCE STATUS: 1 - Symptomatic but completely ambulatory  Vitals:   03/22/22 0957  BP: 129/72  Pulse: 78  Resp: 18  Temp:  (!) 96.4 F (35.8 C)   Filed Weights   03/22/22 0957  Weight: 168 lb 11.2 oz (76.5 kg)    Physical Exam Constitutional:      General: He is not in acute distress.    Appearance: He is not diaphoretic.  HENT:     Head: Normocephalic and atraumatic.     Nose: Nose normal.     Mouth/Throat:     Pharynx: No oropharyngeal exudate.  Eyes:     General: No scleral icterus.    Pupils: Pupils are equal, round, and reactive to light.  Cardiovascular:     Rate and Rhythm: Normal rate and regular rhythm.     Heart sounds: No murmur heard. Pulmonary:     Effort: Pulmonary effort is normal. No respiratory distress.     Breath sounds: No rales.  Chest:     Chest wall: No tenderness.  Abdominal:     General: There is no distension.     Palpations: Abdomen is soft.     Tenderness: There is no abdominal tenderness.  Musculoskeletal:        General: Normal range of motion.     Cervical back: Normal range of motion and neck supple.  Skin:    General: Skin is warm and dry.     Findings: No erythema.  Neurological:     Mental Status: He is alert and oriented to person, place, and time.     Cranial Nerves: No cranial nerve deficit.     Motor: No abnormal muscle tone.     Coordination: Coordination normal.  Psychiatric:        Mood and Affect: Mood and affect normal.      LABORATORY DATA:  I have reviewed the data as listed    Latest Ref Rng & Units 03/01/2022    5:15 AM 02/27/2022    4:46 AM 02/26/2022    3:04 PM  CBC  WBC 4.0 - 10.5 K/uL 6.6  6.4  6.9   Hemoglobin 13.0 - 17.0 g/dL 11.0  12.1  12.7   Hematocrit 39.0 - 52.0 % 34.7  37.9  39.2   Platelets 150 - 400 K/uL 264  263  316  Latest Ref Rng & Units 03/01/2022    5:15 AM 02/27/2022    4:46 AM 02/26/2022    3:04 PM  CMP  Glucose 70 - 99 mg/dL 93  111  90   BUN 6 - 20 mg/dL '10  11  12   '$ Creatinine 0.61 - 1.24 mg/dL 0.96  0.87  1.26   Sodium 135 - 145 mmol/L 140  137  138   Potassium 3.5 - 5.1 mmol/L 3.5  4.0  3.3    Chloride 98 - 111 mmol/L 113  112  108   CO2 22 - 32 mmol/L '24  19  20   '$ Calcium 8.9 - 10.3 mg/dL 8.1  7.6  8.1   Total Protein 6.5 - 8.1 g/dL  6.1  6.6   Total Bilirubin 0.3 - 1.2 mg/dL  0.5  0.5   Alkaline Phos 38 - 126 U/L  56  54   AST 15 - 41 U/L  11  15   ALT 0 - 44 U/L  10  9       RADIOGRAPHIC STUDIES: I have personally reviewed the radiological images as listed and agreed with the findings in the report. NM PET Image Initial (PI) Skull Base To Thigh (F-18 FDG)  Result Date: 03/19/2022 CLINICAL DATA:  Initial treatment strategy for head neck cancer. EXAM: NUCLEAR MEDICINE PET SKULL BASE TO THIGH TECHNIQUE: 8.62 mCi F-18 FDG was injected intravenously. Full-ring PET imaging was performed from the skull base to thigh after the radiotracer. CT data was obtained and used for attenuation correction and anatomic localization. Fasting blood glucose: 112 mg/dl COMPARISON:  Neck MRI from February 27, 2022 and CT angiography of the head and neck which was also performed in August of 2023. FINDINGS: Mediastinal blood pool activity: SUV max 2.18 Liver activity: SUV max NA NECK: Known tonsillar mass with loss of normal soft tissue planes about the RIGHT oropharynx and parapharyngeal space (image 24/4) measuring approximately 2.1 cm with a maximum SUV of 18.98. At the RIGHT skull base is another area of increased metabolic activity corresponding nodal disease that is better illustrated on the previous neck MRI (image 16/4) just below the jugular foramen and associated with the RIGHT styloid process this measures approximately 16 mm with a maximum SUV of 17.55 (Image 30/4) large RIGHT neck mass with indistinct margins at level II/III measuring 3.5 x 2.6 cm with a maximum SUV of 17.99 No contralateral nodal disease. Incidental CT findings: Small cutaneous lesion suggested on image 26/4) 10 mm without signs of increased metabolic activity. CHEST: No hypermetabolic mediastinal or hilar nodes. No suspicious  pulmonary nodules on the CT scan. Incidental CT findings: Aortic atherosclerosis with calcification. Normal heart size. No pericardial effusion. No adenopathy by size criteria in the chest. Signs of moderate pulmonary emphysema at the lung apices. No effusion, consolidation or airway abnormality. ABDOMEN/PELVIS: No abnormal hypermetabolic activity within the liver, pancreas, adrenal glands, or spleen. No hypermetabolic lymph nodes in the abdomen or pelvis. Incidental CT findings: Liver with smooth contours. No acute process related to the gallbladder, pancreas, spleen, adrenal glands, kidneys, urinary bladder, stomach, small or large bowel. The appendix is normal. Aortic atherosclerosis. No aneurysmal dilation. No adenopathy in the abdomen or in the pelvis by size criteria. 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 SKELETON: No focal hypermetabolic activity to suggest skeletal metastasis. Facet arthropathy on the RIGHT at C6-C7 which is asymmetric to contralateral C6-C7 displays FDG uptake which is asymmetric but favored to be related to degenerative process. Maximum  SUV 4.6. Incidental CT findings: None. IMPRESSION: Signs of RIGHT neck malignancy with metastatic involvement just below the skull base and at level II/III also on the RIGHT. No signs of contralateral nodal involvement. No signs of distant metastatic disease. Electronically Signed   By: Zetta Bills M.D.   On: 03/19/2022 15:45   [place] CORE BIOPSY (LYMPH NODES)  Result Date: 02/28/2022 INDICATION: Tonsillar mass and right neck lymphadenopathy EXAM: Ultrasound-guided biopsy of right neck lymph node MEDICATIONS: None. COMPLICATIONS: None immediate. PROCEDURE: Informed written consent was obtained from the patient after a thorough discussion of the procedural risks, benefits and alternatives. All questions were addressed. Maximal Sterile Barrier Technique was utilized including caps, mask, sterile gowns, sterile gloves, sterile drape, hand hygiene and skin  antiseptic. A timeout was performed prior to the initiation of the procedure. Patient position supine on the ultrasound table. Right neck skin prepped and draped in usual sterile fashion. Following local lidocaine administration, four 18 gauge cores were obtained from the enlarged right neck lymph node utilizing continuous ultrasound guidance. Samples were sent to pathology in sterile saline. Needle removed and hemostasis achieved with 1 minutes of manual compression. Post procedure ultrasound images showed no evidence of significant hemorrhage. IMPRESSION: Ultrasound-guided biopsy of enlarged right neck lymph node. Electronically Signed   By: Miachel Roux M.D.   On: 02/28/2022 15:43   ECHOCARDIOGRAM COMPLETE BUBBLE STUDY  Result Date: 02/28/2022    ECHOCARDIOGRAM REPORT   Patient Name:   [facility] Date of Exam: 02/28/2022 Medical Rec #:  [id number]      Height:       69.0 in Accession #:    [id number]     Weight:       185.6 lb Date of Birth:  1961/01/24      BSA:          2.002 m Patient Age:    77 years       BP:           102/52 mmHg Patient Gender: M              HR:           62 bpm. Exam Location:  [facility] Procedure: 2D Echo, Cardiac Doppler, Color Doppler and Saline Contrast Bubble            Study Indications:     Syncope 780.2 / R55  History:         Patient has no prior history of Echocardiogram examinations.                  COPD; Risk Factors:Hypertension.  Sonographer:     Sherrie Sport Referring Phys:  [place] Diagnosing Phys: Serafina Royals MD  Sonographer Comments: Suboptimal apical window. IMPRESSIONS  1. Left ventricular ejection fraction, by estimation, is 60 to 65%. The left ventricle has normal function. The left ventricle has no regional wall motion abnormalities. Left ventricular diastolic parameters were normal.  2. Right ventricular systolic function is normal. The right ventricular size is normal.  3. The mitral valve is normal in structure. Mild to moderate mitral valve  regurgitation.  4. Tricuspid valve regurgitation is moderate.  5. The aortic valve is normal in structure. Aortic valve regurgitation is not visualized. FINDINGS  Left Ventricle: Left ventricular ejection fraction, by estimation, is 60 to 65%. The left ventricle has normal function. The left ventricle has no regional wall motion abnormalities. The left ventricular internal cavity size was normal in size. There is  no left ventricular hypertrophy. Left ventricular diastolic parameters were normal. Right Ventricle: The right ventricular size is normal. No increase in right ventricular wall thickness. Right ventricular systolic function is normal. Left Atrium: Left atrial size was normal in size. Right Atrium: Right atrial size was normal in size. Pericardium: There is no evidence of pericardial effusion. Mitral Valve: The mitral valve is normal in structure. Mild to moderate mitral valve regurgitation. Tricuspid Valve: The tricuspid valve is normal in structure. Tricuspid valve regurgitation is moderate. Aortic Valve: The aortic valve is normal in structure. Aortic valve regurgitation is not visualized. Aortic valve mean gradient measures 2.0 mmHg. Aortic valve peak gradient measures 3.3 mmHg. Aortic valve area, by VTI measures 2.36 cm. Pulmonic Valve: The pulmonic valve was normal in structure. Pulmonic valve regurgitation is not visualized. Aorta: The aortic root and ascending aorta are structurally normal, with no evidence of dilitation. IAS/Shunts: No atrial level shunt detected by color flow Doppler. Agitated saline contrast was given intravenously to evaluate for intracardiac shunting.  LEFT VENTRICLE PLAX 2D LVIDd:         4.60 cm   Diastology LVIDs:         3.20 cm   LV e' medial:    10.40 cm/s LV PW:         1.10 cm   LV E/e' medial:  4.2 LV IVS:        0.90 cm   LV e' lateral:   11.40 cm/s LVOT diam:     2.00 cm   LV E/e' lateral: 3.9 LV SV:         46 LV SV Index:   23 LVOT Area:     3.14 cm  RIGHT  VENTRICLE RV Basal diam:  3.90 cm RV S prime:     12.90 cm/s TAPSE (M-mode): 2.0 cm LEFT ATRIUM             Index        RIGHT ATRIUM           Index LA diam:        3.00 cm 1.50 cm/m   RA Area:     18.60 cm LA Vol (A2C):   45.3 ml 22.63 ml/m  RA Volume:   55.60 ml  27.78 ml/m LA Vol (A4C):   38.4 ml 19.19 ml/m LA Biplane Vol: 44.3 ml 22.13 ml/m  AORTIC VALVE AV Area (Vmax):    2.02 cm AV Area (Vmean):   2.20 cm AV Area (VTI):     2.36 cm AV Vmax:           91.10 cm/s AV Vmean:          58.750 cm/s AV VTI:            0.194 m AV Peak Grad:      3.3 mmHg AV Mean Grad:      2.0 mmHg LVOT Vmax:         58.70 cm/s LVOT Vmean:        41.100 cm/s LVOT VTI:          0.146 m LVOT/AV VTI ratio: 0.75  AORTA Ao Root diam: 2.90 cm MITRAL VALVE               TRICUSPID VALVE MV Area (PHT): 2.82 cm    TR Peak grad:   27.2 mmHg MV Decel Time: 269 msec    TR Vmax:        261.00 cm/s MV E velocity: 44.10  cm/s MV A velocity: 97.30 cm/s  SHUNTS MV E/A ratio:  0.45        Systemic VTI:  0.15 m                            Systemic Diam: 2.00 cm Serafina Royals MD Electronically signed by Serafina Royals MD Signature Date/Time: 02/28/2022/12:51:18 PM    Final    US Carotid Bilateral  Result Date: 02/27/2022 CLINICAL DATA:  61 year old male with a history of syncope EXAM: BILATERAL CAROTID DUPLEX ULTRASOUND TECHNIQUE: Pearline Cables scale imaging, color Doppler and duplex ultrasound were performed of bilateral carotid and vertebral arteries in the neck. COMPARISON:  Neuro imaging of the same day and 1 day prior FINDINGS: Criteria: Quantification of carotid stenosis is based on velocity parameters that correlate the residual internal carotid diameter with NASCET-based stenosis levels, using the diameter of the distal internal carotid lumen as the denominator for stenosis measurement. The following velocity measurements were obtained: RIGHT ICA:  Systolic 956 cm/sec, Diastolic not recorded CCA:  57 cm/sec SYSTOLIC ICA/CCA RATIO:  2.0 ECA:  149  cm/sec LEFT ICA:  Systolic 62 cm/sec, Diastolic not recorded CCA:  48 cm/sec SYSTOLIC ICA/CCA RATIO:  1.3 ECA:  118 cm/sec Right Brachial SBP: Not acquired Left Brachial SBP: Not acquired RIGHT CAROTID ARTERY: No significant calcifications of the right common carotid artery. Intermediate waveform maintained. Heterogeneous and partially calcified plaque at the right carotid bifurcation. No significant lumen shadowing. Low resistance waveform of the right ICA. No significant tortuosity. RIGHT VERTEBRAL ARTERY: Antegrade flow with low resistance waveform. LEFT CAROTID ARTERY: No significant calcifications of the left common carotid artery. Intermediate waveform maintained. Heterogeneous and partially calcified plaque at the left carotid bifurcation without significant lumen shadowing. Low resistance waveform of the left ICA. No significant tortuosity. LEFT VERTEBRAL ARTERY:  Antegrade flow with low resistance waveform. Additional: Incidental imaging of pathologic lymphadenopathy of the right neck, better characterized on recent cross-sectional neuro imaging. IMPRESSION: Color duplex indicates minimal heterogeneous and calcified plaque, with no hemodynamically significant stenosis by duplex criteria in the extracranial cerebrovascular circulation. Pathologic lymphadenopathy of the neck better characterized on recent neuro imaging. Signed, Dulcy Fanny. Nadene Rubins, RPVI [facility] Radiology Electronically Signed   By: Corrie Mckusick D.O.   On: 02/27/2022 14:02   MR NECK SOFT TISSUE ONLY W WO CONTRAST  Result Date: 02/27/2022 CLINICAL DATA:  61 year old male with dizziness. Neck pain. Right neck level 2/3 soft tissue mass on CTA neck yesterday thought to be abnormal lymph nodes. EXAM: MRI OF THE NECK WITH CONTRAST TECHNIQUE: Multiplanar, multisequence MR imaging was performed following the administration of intravenous contrast. CONTRAST:  26m GADAVIST GADOBUTROL 1  MMOL/ML IV SOLN COMPARISON:  CTA head and neck yesterday. Brain MRI today reported separately. FINDINGS: Pharynx and larynx: Larynx is within normal limits. Vallecula and hypopharynx within normal limits. Asymmetric although fairly subtle 15 mm area of masslike T2 hyperintensity and homogeneous enhancement along the posterior and lateral wall of the oropharynx on the right (series 12, image 11 and series 18, image 11). This area has abnormal diffusion on coronal brain DWI today. This is just below the soft palate and uvula which appear to remain normal. The mass is up to 2.4 cm long axis on series 14, image 12. Nasopharynx contour is within normal limits. Midline retropharyngeal space and bilateral parapharyngeal spaces are within normal limits. Salivary glands: Negative except for mild mass effect on the  right submandibular gland, see nodal findings below. Thyroid: Negative. Lymph nodes: Heterogeneous T2 hyperintense and enhancing soft tissue mass posterior to the right carotid space at the junction of the right level 2 B and 3B nodal station is 23 x 34 x 45 mm (AP by transverse by CC). This is inseparable from the posterior carotid space. And a 2nd smaller area of similar asymmetric T2 heterogeneous soft tissue and enhancement just below the skull base (series 12, image 6 and also series 13, image 14) is roughly 16 x 14 x 30 mm (AP by transverse by CC) and mildly narrow due the adjacent right ICA on the CTA yesterday. Burtis Junes this is malignant nodal versus ex nodal disease. No other cervical lymphadenopathy identified. Vascular: Maintained vascular flow voids in the neck. See CTA findings yesterday. Limited intracranial: Brain MRI today reported separately. Visualized orbits: Not included. Mastoids and visualized paranasal sinuses: Well aerated. Skeleton: Cervical spine degeneration including disc disease and degenerative appearing anterolisthesis of C3 on C4 with facet degeneration. No osseous metastatic disease  identified. No cervical spine dural thickening. Cervical spinal cord appears to be normal. Upper chest: Better demonstrated on CTA yesterday. 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 Recommend ENT consultation. Electronically Signed   By: Genevie Ann M.D.   On: 02/27/2022 08:32   MR BRAIN WO CONTRAST  Result Date: 02/27/2022 CLINICAL DATA:  61 year old male with syncope, dizziness. EXAM: MRI HEAD WITHOUT CONTRAST TECHNIQUE: Multiplanar, multiecho pulse sequences of the brain and surrounding structures were obtained without intravenous contrast. COMPARISON:  CTA head and neck yesterday. FINDINGS: Brain: No restricted diffusion to suggest acute infarction. No midline shift, mass effect, evidence of mass lesion, ventriculomegaly, extra-axial collection or acute intracranial hemorrhage. Cervicomedullary junction and pituitary are within normal limits. Patchy, widely scattered bilateral cerebral white matter T2 and FLAIR hyperintensity. This is in a nonspecific pattern. No cortical encephalomalacia identified. In the subcortical white matter of the left superior frontal gyrus posteriorly there is a small T2 heterogeneous 6-7 mm lesion (series 10, image 19) with pronounced hemosiderin on SWI (series 13, image 42). No regional edema or mass effect. No other chronic cerebral blood products. Deep gray nuclei, brainstem and cerebellum are within normal limits. Vascular: Major intracranial vascular flow voids are preserved. Skull and upper cervical spine: Negative visible cervical spine. Visualized bone marrow signal is within normal limits. Sinuses/Orbits: Negative orbits. Paranasal sinuses and mastoids are stable and well aerated. Other: There is is 3 cm segment of abnormal diffusion in the right upper neck just below the skull base (series 7,  image 22. And similar asymmetric diffusion along the right posterior pharynx series 7, image 23. See dedicated Neck MRI today reported separately. IMPRESSION: 1. No acute intracranial abnormality. Abnormal right neck soft tissues. See dedicated Neck MRI today reported separately. 2. 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 These are slow flow vascular malformations which are generally asymptomatic. 3. Other moderately advanced but nonspecific cerebral white matter signal changes. Electronically Signed   By: Genevie Ann M.D.   On: 02/27/2022 08:22   CT ANGIO HEAD NECK W WO CM  Result Date: 02/26/2022 CLINICAL DATA:  Provided history: Dizziness, persistent/recurrent, cardiac or vascular cause suspected; recurrent syncope, neck pain, severe. EXAM: CT ANGIOGRAPHY HEAD AND NECK TECHNIQUE: Multidetector CT imaging of  the head and neck was performed using the standard protocol during bolus administration of intravenous contrast. Multiplanar CT image reconstructions and MIPs were obtained to evaluate the vascular anatomy. Carotid stenosis measurements (when applicable) are obtained utilizing NASCET criteria, using the distal internal carotid diameter as the denominator. RADIATION DOSE REDUCTION: This exam was performed according to the departmental dose-optimization program which includes automated exposure control, adjustment of the mA and/or kV according to patient size and/or use of iterative reconstruction technique. CONTRAST:  76m OMNIPAQUE IOHEXOL 350 MG/ML SOLN COMPARISON:  Head CT 06/18/2011. Report from brain MRI 05/12/1996 (images unavailable). FINDINGS: CT HEAD FINDINGS Brain: Mild generalized parenchymal atrophy. Mild patchy and ill-defined hypoattenuation within the cerebral white matter, nonspecific but most often secondary to chronic small vessel ischemia. There is no acute intracranial hemorrhage. No demarcated cortical infarct.  No extra-axial fluid collection. No evidence of an intracranial mass. No midline shift. Vascular: No hyperdense vessel. Atherosclerotic calcifications. Skull: No fracture or aggressive osseous lesion. Sinuses/Orbits: No orbital mass or acute orbital finding. Mild mucosal thickening within the right frontal, bilateral ethmoid and bilateral maxillary sinuses. Review of the MIP images confirms the above findings CTA NECK FINDINGS Aortic arch: Standard aortic branching. Atherosclerotic plaque within the visualized aortic arch and proximal major branch vessels of the neck. Streak and beam hardening artifact arising from a dense left-sided contrast bolus partially obscures the left subclavian artery. Within this limitation, there is no appreciable hemodynamically significant innominate or proximal subclavian artery stenosis. Right carotid system: CCA and ICA patent within the neck. Atherosclerotic plaque within the proximal ICA, resulting in less than 50% stenosis. There is abnormal soft tissue surrounding the mid-to-distal cervical ICA with associated mild narrowing of the ICA at these levels. Left carotid system: CCA and ICA patent within the neck without stenosis. Mild atherosclerotic plaque at the CCA origin and about the carotid bifurcation. Vertebral arteries: Vertebral arteries patent within the neck. Mild to moderate atherosclerotic narrowing at the origin of the right vertebral artery. Calcified plaque at the origin of the left vertebral artery with no more than mild stenosis. Skeleton: Reversal of the expected cervical lordosis. Cervical spondylosis. No acute fracture or aggressive osseous lesion. Other neck: 4.3 x 2.5 cm ovoid soft tissue focus at the right level II/level III stations (along the posterior aspect of the proximal ICA, carotid bifurcation and distal CCA). This likely reflects an enlarged lymph node (for instance as seen on series 10, image 223) (series 13, image 90). Subcentimeter thyroid nodules,  not meeting consensus criteria for ultrasound follow-up based on size. Reference: J Am Coll Radiol. 2015 Feb;12(2): 143-50. Upper chest: Centrilobular and paraseptal emphysema. No consolidation within the imaged lung apices. Review of the MIP images confirms the above findings CTA HEAD FINDINGS Anterior circulation: The intracranial internal carotid arteries are patent. The M1 middle cerebral arteries are patent. No M2 proximal branch occlusion or high-grade proximal stenosis is identified. Atherosclerotic irregularity of the M2 and more distal MCA vessels, bilaterally. The anterior cerebral arteries are patent. No intracranial aneurysm is identified. Posterior circulation: The intracranial vertebral arteries are patent. The basilar artery is patent. The posterior cerebral arteries are patent. A left posterior communicating artery is present. The right posterior communicating artery is diminutive or absent. Venous sinuses: Within the limitations of contrast timing, no convincing thrombus. Anatomic variants: As described. Review of the MIP images confirms the above findings IMPRESSION: CT head: 1. No evidence of acute intracranial abnormality. 2. Mild patchy and ill-defined hypoattenuation within the cerebral white matter, nonspecific  but most often secondary to chronic small vessel ischemia. 3. Mild paranasal sinus mucosal thickening. CTA neck: 1. 4.3 x 2.5 cm ovoid soft tissue focus at the right level II/III stations (along the posterior aspect of the proximal ICA, carotid bifurcation and distal CCA). This likely reflects an enlarged lymph node. Given the size, this is concerning for nodal metastatic disease, although a reactive/inflammatory lymph node is possible. 2. Abnormal soft tissue surrounding the mid-to-distal cervical right ICA with resultant mild vessel narrowing. Findings are nonspecific, but may reflect a nodal mass or sequela of a vasculopathy. A contrast-enhanced neck CT is recommended for initial  further evaluation of this finding, and for initial further evaluation of the finding described in impression #1. 3. Atherosclerotic plaque within the bilateral common carotid and cervical internal carotid arteries without hemodynamically significant stenosis. 4. Vertebral arteries patent within the neck. Mild-to-moderate atherosclerotic narrowing at the origin of the right vertebral artery. No more than mild atherosclerotic stenosis at the origin of the left vertebral artery. 5. Aortic Atherosclerosis (ICD10-I70.0) and Emphysema (ICD10-J43.9). 6. Cervical spondylosis. CTA head: Intracranial atherosclerotic disease without intracranial large vessel occlusion or proximal high-grade arterial stenosis. Electronically Signed   By: Kellie Simmering D.O.   On: 02/26/2022 17:13   DG Chest Portable 1 View  Result Date: 02/26/2022 CLINICAL DATA:  Syncope EXAM: PORTABLE CHEST 1 VIEW COMPARISON:  February 13, 2022, April 07, 2018 FINDINGS: The heart size and mediastinal contours are within normal limits. Both lungs are clear. The visualized skeletal structures are unremarkable. IMPRESSION: No acute cardiopulmonary abnormality. Electronically Signed   By: Beryle Flock M.D.   On: 02/26/2022 15:37

## 2022-03-22 NOTE — Assessment & Plan Note (Signed)
Encourage oral hydration increase Expect symptoms to improve with chemotherapy and radiation.

## 2022-03-22 NOTE — Assessment & Plan Note (Addendum)
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 Recommend concurrent chemotherapy with radiation. Patient has baseline hearing loss of right ear and is not wanting to further compromise his hearing significantly.  Recommend concurrent carboplatin AUC of 2 with radiation. The diagnosis and care plan were discussed with patient in detail. The goal of treatment is with curative intent.  Chemotherapy education was provided.  We had discussed the composition of chemotherapy regimen, length of chemo cycle, duration of treatment and the time to assess response to treatment.    I explained to the patient the risks and benefits of chemotherapy including all but not limited to hair loss, allergic reaction, mouth sore, nausea, vomiting, diarrhea, low blood counts, bleeding, neuropathy and risk of life threatening infection and even death, secondary malignancy etc.  . Patient voices understanding and willing to proceed chemotherapy.   # Chemotherapy education; Antiemetics-Zofran and Compazine

## 2022-03-22 NOTE — Assessment & Plan Note (Signed)
Imaging indicates degenerative disease. Currently on Oxycodone as needed Refer to palliative care for evaluation.

## 2022-03-22 NOTE — Progress Notes (Signed)
DISCONTINUE ON PATHWAY REGIMEN - Head and Neck     A cycle is every 7 days:     Carboplatin   **Always confirm dose/schedule in your pharmacy ordering system**  REASON: Other Reason PRIOR TREATMENT: [id number]: Carboplatin AUC=2 q7 Days with Concurrent Radiation  START ON PATHWAY REGIMEN - Head and Neck     A cycle is every 7 days:     Carboplatin   **Always confirm dose/schedule in your pharmacy ordering system**  Patient Characteristics: Oropharynx, HPV Negative/Unknown, Preoperative or Nonsurgical Candidate (Clinical Staging), Stage IVA/B Disease Classification: Oropharynx HPV Status: Negative (-) Therapeutic Status: Preoperative or Nonsurgical Candidate (Clinical Staging) AJCC T Category: cT2 AJCC 8 Stage Grouping: IVA AJCC N Category: cN2b AJCC M Category: cM0 Intent of Therapy: Curative Intent, Discussed with Patient

## 2022-03-23 ENCOUNTER — Inpatient Hospital Stay: Payer: Medicaid Other

## 2022-03-23 ENCOUNTER — Telehealth: Payer: Self-pay | Admitting: *Deleted

## 2022-03-23 ENCOUNTER — Other Ambulatory Visit: Payer: Self-pay

## 2022-03-23 DIAGNOSIS — F17210 Nicotine dependence, cigarettes, uncomplicated: Secondary | ICD-10-CM | POA: Insufficient documentation

## 2022-03-23 DIAGNOSIS — Z5111 Encounter for antineoplastic chemotherapy: Secondary | ICD-10-CM | POA: Insufficient documentation

## 2022-03-23 DIAGNOSIS — C099 Malignant neoplasm of tonsil, unspecified: Secondary | ICD-10-CM

## 2022-03-23 DIAGNOSIS — Z515 Encounter for palliative care: Secondary | ICD-10-CM | POA: Insufficient documentation

## 2022-03-23 DIAGNOSIS — C770 Secondary and unspecified malignant neoplasm of lymph nodes of head, face and neck: Secondary | ICD-10-CM | POA: Insufficient documentation

## 2022-03-23 DIAGNOSIS — G9001 Carotid sinus syncope: Secondary | ICD-10-CM | POA: Insufficient documentation

## 2022-03-23 DIAGNOSIS — Z20822 Contact with and (suspected) exposure to covid-19: Secondary | ICD-10-CM | POA: Insufficient documentation

## 2022-03-23 DIAGNOSIS — G8929 Other chronic pain: Secondary | ICD-10-CM | POA: Insufficient documentation

## 2022-03-23 DIAGNOSIS — G893 Neoplasm related pain (acute) (chronic): Secondary | ICD-10-CM | POA: Insufficient documentation

## 2022-03-23 DIAGNOSIS — K5900 Constipation, unspecified: Secondary | ICD-10-CM | POA: Insufficient documentation

## 2022-03-23 DIAGNOSIS — I10 Essential (primary) hypertension: Secondary | ICD-10-CM | POA: Insufficient documentation

## 2022-03-23 DIAGNOSIS — K1230 Oral mucositis (ulcerative), unspecified: Secondary | ICD-10-CM | POA: Insufficient documentation

## 2022-03-23 DIAGNOSIS — J329 Chronic sinusitis, unspecified: Secondary | ICD-10-CM | POA: Insufficient documentation

## 2022-03-23 DIAGNOSIS — Z419 Encounter for procedure for purposes other than remedying health state, unspecified: Secondary | ICD-10-CM | POA: Diagnosis not present

## 2022-03-23 DIAGNOSIS — Z510 Encounter for antineoplastic radiation therapy: Secondary | ICD-10-CM | POA: Insufficient documentation

## 2022-03-23 NOTE — Telephone Encounter (Signed)
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 Patient needs to be presented in [facility]. Will need to coordinate dietary referral to [place].

## 2022-03-27 ENCOUNTER — Other Ambulatory Visit: Payer: Self-pay

## 2022-03-27 ENCOUNTER — Encounter: Payer: Self-pay | Admitting: Cardiology

## 2022-03-27 ENCOUNTER — Ambulatory Visit
Admission: RE | Admit: 2022-03-27 | Discharge: 2022-03-27 | Disposition: A | Discharge status: Home / Self Care | Payer: Medicaid Other | Source: Ambulatory Visit | Attending: Radiation Oncology | Admitting: Radiation Oncology

## 2022-03-27 ENCOUNTER — Other Ambulatory Visit: Payer: Self-pay | Admitting: *Deleted

## 2022-03-27 ENCOUNTER — Inpatient Hospital Stay: Payer: Medicaid Other

## 2022-03-27 ENCOUNTER — Inpatient Hospital Stay (HOSPITAL_BASED_OUTPATIENT_CLINIC_OR_DEPARTMENT_OTHER): Payer: Medicaid Other | Admitting: Hospice and Palliative Medicine

## 2022-03-27 VITALS — BP 138/81 | HR 81 | Temp 97.7°F | Resp 20 | Ht 71.0 in | Wt 168.7 lb

## 2022-03-27 DIAGNOSIS — F17210 Nicotine dependence, cigarettes, uncomplicated: Secondary | ICD-10-CM | POA: Diagnosis not present

## 2022-03-27 DIAGNOSIS — Z20822 Contact with and (suspected) exposure to covid-19: Secondary | ICD-10-CM | POA: Diagnosis not present

## 2022-03-27 DIAGNOSIS — Z515 Encounter for palliative care: Secondary | ICD-10-CM | POA: Diagnosis not present

## 2022-03-27 DIAGNOSIS — Z5111 Encounter for antineoplastic chemotherapy: Secondary | ICD-10-CM | POA: Diagnosis not present

## 2022-03-27 DIAGNOSIS — G9001 Carotid sinus syncope: Secondary | ICD-10-CM | POA: Diagnosis not present

## 2022-03-27 DIAGNOSIS — K1230 Oral mucositis (ulcerative), unspecified: Secondary | ICD-10-CM | POA: Diagnosis not present

## 2022-03-27 DIAGNOSIS — G893 Neoplasm related pain (acute) (chronic): Secondary | ICD-10-CM | POA: Diagnosis not present

## 2022-03-27 DIAGNOSIS — C099 Malignant neoplasm of tonsil, unspecified: Secondary | ICD-10-CM | POA: Diagnosis not present

## 2022-03-27 DIAGNOSIS — Z510 Encounter for antineoplastic radiation therapy: Secondary | ICD-10-CM | POA: Diagnosis not present

## 2022-03-27 DIAGNOSIS — C770 Secondary and unspecified malignant neoplasm of lymph nodes of head, face and neck: Secondary | ICD-10-CM | POA: Diagnosis not present

## 2022-03-27 LAB — RAD ONC ARIA SESSION SUMMARY
Course Elapsed Days: 0
Plan Fractions Treated to Date: 1
Plan Prescribed Dose Per Fraction: 2 Gy
Plan Total Fractions Prescribed: 35
Plan Total Prescribed Dose: 70 Gy
Reference Point Dosage Given to Date: 2 Gy
Reference Point Session Dosage Given: 2 Gy
Session Number: 1

## 2022-03-27 MED ORDER — SENNA 8.6 MG PO TABS: 1.0000 | ORAL_TABLET | Freq: Every day | ORAL | 0 refills | Status: DC

## 2022-03-27 MED ORDER — SUCRALFATE 1 G PO TABS: 1.0000 g | ORAL_TABLET | Freq: Three times a day (TID) | ORAL | 2 refills | Status: DC

## 2022-03-27 MED ORDER — GABAPENTIN 100 MG PO CAPS: 100.0000 mg | ORAL_CAPSULE | Freq: Every day | ORAL | 1 refills | Status: DC

## 2022-03-27 MED ORDER — AMOXICILLIN-POT CLAVULANATE 875-125 MG PO TABS: 1.0000 | ORAL_TABLET | Freq: Three times a day (TID) | ORAL | 0 refills | Status: AC

## 2022-03-27 MED FILL — Dexamethasone Sodium Phosphate Inj 100 MG/10ML: INTRAMUSCULAR | Qty: 1 | Status: AC

## 2022-03-27 NOTE — Telephone Encounter (Signed)
Received fax. Will have scanned into chart and forward to Dr. Garen Lah.

## 2022-03-27 NOTE — Progress Notes (Signed)
Nutrition Assessment   Reason for Assessment:  New head and neck cancer patient   ASSESSMENT:  61 year old male with stage IV right tonsillar SCC with cervical lymph node mets.  Patient starting concurrent chemotherapy (carboplatin) and radiation.  Met with patient following palliative care.  Patient reports that his appetite has decreased recently.  Says that his throat is a little bit sore.  Not avoiding foods due to pain or trouble swallowing.  Yesterday able to eat 2 pieces of fish, grapes, Pure protein shake, graham crackers with peanut butter, chips, pork chop and 1/2 cup pinto beans.  Reports constipation but NP adding senna today.  n    Nutrition Focused Physical Exam:   Orbital Region: normal Buccal Region: normal Upper Arm Region: normal Thoracic and Lumbar Region: normal Temple Region: normal Clavicle Bone Region: normal  Shoulder and Acromion Bone Region: normal Scapular Bone Region: normal Dorsal Hand: normal Patellar Region: normal Anterior Thigh Region: normal Posterior Calf Region: normal Edema (RD assessment): none Hair: reviewed Eyes: reviewed Mouth: reviewed Skin: reviewed Nails: reviewed   Medications: ferrous sulfate, lasix, protonix, compazine, senokot, carafate, vit D   Labs: reviewed   Anthropometrics:   Height: 71 inches Weight: 168 lb 11.2 oz UBW: 176 lb per patient BMI: 23  4% weight loss in the last month   Estimated Energy Needs  Kcals: 2000-2400 Protein: 100-120 g Fluid: 2000-2400 ml/d    NUTRITION DIAGNOSIS: Inadequate oral intake related to tonsillar cancer as evidenced by 4% weight loss and decreased intake    INTERVENTION:  Discussed ways to increase calories and protein in diet. Handout provided Recommend 350 calorie oral nutrition supplement or higher.  Samples of ensure complete given and complimentary case of ensure high protein given.   Recipes for smoothies given as well NP planning to make referral to  [facility] Contact information given to patient    MONITORING, EVALUATION, GOAL: weight trends, intake   Next Visit: Wednesday, Sept 20 during infusion  Jenilee Franey B. Zenia Resides, [place], [place] Registered Dietitian [phone]

## 2022-03-27 NOTE — Progress Notes (Deleted)
Cardiology Clinic Note   Patient Name: Clarence Skinner Date of Encounter: 03/27/2022  Primary Care Provider:  Mechele Claude, FNP Primary Cardiologist:  Kate Sable, MD  Patient Profile    61 year old male with past medical history of hypertension, gastroesophageal reflux disease, gout, degenerative disc disease, current smoker x30+ years, COPD, OSA, bradycardia, and presyncope, who presents today for follow-up on his presyncope likely secondary to vasovagal.  Past Medical History    Past Medical History:  Diagnosis Date   COPD (chronic obstructive pulmonary disease) ([place])    GERD (gastroesophageal reflux disease)    Gout    Hypertension    Multilevel degenerative disc disease    Pneumonia 11/29/2017   Sleep apnea    CPAP   Tachycardia    Wears dentures    partial upper and lower   Past Surgical History:  Procedure Laterality Date   ABCESS DRAINAGE  2009   tonsil   COLONOSCOPY WITH PROPOFOL N/A 02/11/2018   Procedure: COLONOSCOPY WITH PROPOFOL;  Surgeon: Toledo, Benay Pike, MD;  Location: [facility];  Service: Endoscopy;  Laterality: N/A;   ESOPHAGOGASTRODUODENOSCOPY (EGD) WITH PROPOFOL N/A 02/11/2018   Procedure: ESOPHAGOGASTRODUODENOSCOPY (EGD) WITH PROPOFOL;  Surgeon: Toledo, Benay Pike, MD;  Location: [facility];  Service: Endoscopy;  Laterality: N/A;   MASS EXCISION Right 08/14/2017   Procedure: EXCISION RIGHT TEMPORAL MASS SUBMENTAL MASS;  Surgeon: Carloyn Manner, MD;  Location: [place];  Service: ENT;  Laterality: Right;  sleep apnea   RADIOACTIVE SEED IMPLANT N/A 01/26/2019   Procedure: RADIOACTIVE SEED IMPLANT/BRACHYTHERAPY IMPLANT;  Surgeon: Billey Co, MD;  Location: [facility];  Service: Urology;  Laterality: N/A;   SEPTOPLASTY  2016   [place], [place]   VOLUME STUDY N/A 12/29/2018   Procedure: VOLUME STUDY;  Surgeon: Billey Co, MD;  Location: [facility];  Service: Urology;  Laterality: N/A;    Allergies  Allergies   Allergen Reactions   Chocolate Itching, Nausea Only and Shortness Of Breath    (large amounts) Other reaction(s): Nausea, Vomiting, Shortness of Breath / Dyspnea   Eggs Or Egg-Derived Products Itching, Nausea And Vomiting, Nausea Only and Shortness Of Breath   Milk (Cow) Shortness Of Breath   Benadryl [Diphenhydramine] Other (See Comments)    Altered mental status "makes me crazy and figety"   Milk-Related Compounds Nausea And Vomiting    History of Present Illness    61 year old male with past medical history of hypertension, current smoker x30+ years, COPD, OSA, gastroesophageal reflux disease, gout, degenerative disc disease,  prostate cancer,  bradycardia, and presyncope.  Patient previously been evaluated in the [facility] emergency department 02/13/2022 for episodes of coughing spells ongoing over the last 3 weeks that usually lead to headache, dizziness, presyncope and a syncopal episode times once.  His last episode occurred, when he called EMS, as he noted to be bradycardic with a heart rate in the 30s.  Work-up in the emergency department was unrevealing.  Heart rates improved in the ED to the 50s.  He was to wear a cardiac monitor for 7 days that was placed by his PCP.  He underwent carotid duplex study which revealed minimal heterogeneous and calcified plaque, with no hemodynamically significant stenosis by duplex criteria in the extracranial cerebrovascular circulation.  He underwent an echocardiogram with bubble study that revealed an LVEF of 60-65%, no regional wall motion abnormalities, mild to moderate mitral regurgitation, and moderate tricuspid regurgitation.  No intracardiac shunting noted on the study.  He returns to  clinic today   Home Medications    Current Outpatient Medications  Medication Sig Dispense Refill   albuterol (PROVENTIL HFA;VENTOLIN HFA) 108 (90 Base) MCG/ACT inhaler Inhale into the lungs every 6 (six) hours as needed for wheezing or shortness of breath.      allopurinol (ZYLOPRIM) 300 MG tablet Take 300 mg by mouth daily.     amLODipine-benazepril (LOTREL) 10-40 MG capsule Take 1 capsule by mouth daily.     amoxicillin-clavulanate (AUGMENTIN) 875-125 MG tablet Take 1 tablet by mouth every 8 (eight) hours for 10 days. 30 tablet 0   aspirin EC 81 MG tablet Take 1 tablet (81 mg total) by mouth daily. Swallow whole. 90 tablet 3   Azelastine HCl 137 MCG/SPRAY SOLN Place 1 spray into the nose daily as needed (allergies).      baclofen (LIORESAL) 10 MG tablet Take 10 mg by mouth 2 (two) times daily as needed.     benzonatate (TESSALON) 100 MG capsule Take 100 mg by mouth every 8 (eight) hours as needed.     ferrous sulfate 325 (65 FE) MG tablet Take 325 mg by mouth daily.  3   fluticasone (FLONASE) 50 MCG/ACT nasal spray Place into both nostrils daily as needed for allergies or rhinitis.     Fluticasone-Umeclidin-Vilant (TRELEGY ELLIPTA) 100-62.5-25 MCG/ACT AEPB Trelegy Ellipta 100-62.5-25 MCG/INH Inhalation Aerosol Powder Breath Activated QTY: 90 each Days: 90 Refills: 1  Written: 11/02/20 Patient Instructions: One puff once a day     furosemide (LASIX) 20 MG tablet Take 20 mg by mouth daily.     gabapentin (NEURONTIN) 100 MG capsule Take 1 capsule (100 mg total) by mouth at bedtime. 30 capsule 1   loratadine (CLARITIN) 10 MG tablet Take 10 mg by mouth daily.     meloxicam (MOBIC) 7.5 MG tablet Take 7.5 mg by mouth daily as needed for pain.     Oxycodone HCl 10 MG TABS Take 10 mg by mouth every 6 (six) hours as needed.     pantoprazole (PROTONIX) 40 MG tablet Take 40 mg by mouth daily.     prochlorperazine (COMPAZINE) 10 MG tablet Take 1 tablet (10 mg total) by mouth every 6 (six) hours as needed for nausea or vomiting. (Patient not taking: Reported on 03/27/2022) 30 tablet 1   rosuvastatin (CRESTOR) 20 MG tablet Take 1 tablet (20 mg total) by mouth daily. 30 tablet 3   senna (SENOKOT) 8.6 MG TABS tablet Take 1 tablet (8.6 mg total) by mouth daily. 120 tablet  0   sucralfate (CARAFATE) 1 g tablet Take 1 tablet (1 g total) by mouth 4 (four) times daily -  with meals and at bedtime. 30 tablet 2   SYMBICORT 80-4.5 MCG/ACT inhaler Inhale 1 puff into the lungs 2 (two) times daily.     tadalafil (CIALIS) 20 MG tablet Take 1 tablet (20 mg total) by mouth daily. 30 tablet 11   Vitamin D, Ergocalciferol, (DRISDOL) 1.25 MG (50000 UNIT) CAPS capsule Take 50,000 Units by mouth once a week.     No current facility-administered medications for this visit.     Family History    Family History  Problem Relation Age of Onset   Anxiety disorder Mother    Cancer Father    Hypertension Sister    Diabetes Sister    Asthma Sister    Other Brother        mva  at age 21   Gout Brother    Hypertension Brother  Prostate cancer Maternal Uncle    Prostate cancer Maternal Uncle    He indicated that his mother is alive. He indicated that his father is deceased. He indicated that all of his six sisters are alive. He indicated that two of his three brothers are alive. He indicated that both of his maternal uncles are alive.  Social History    Social History   Socioeconomic History   Marital status: Significant Other    Spouse name: Not on file   Number of children: Not on file   Years of education: Not on file   Highest education level: Not on file  Occupational History   Not on file  Tobacco Use   Smoking status: Every Day    Packs/day: 1.00    Years: 30.00    Total pack years: 30.00    Types: Cigarettes   Smokeless tobacco: Former    Types: Snuff, Chew    Quit date: 12/05/1984   Tobacco comments:    since age 60  Vaping Use   Vaping Use: Never used  Substance and Sexual Activity   Alcohol use: Yes    Alcohol/week: 12.0 standard drinks of alcohol    Types: 12 Cans of beer per week    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  Cardiovascular:  No chest pain, dyspnea on exertion, edema, orthopnea, palpitations, paroxysmal nocturnal dyspnea. Dermatological: No rash, lesions/masses Respiratory: No cough, dyspnea Urologic: No hematuria, dysuria Abdominal:   No nausea, vomiting, diarrhea, bright red blood per rectum, melena, or hematemesis Neurologic:  No visual changes, wkns, changes in mental status. All other systems reviewed and are otherwise negative except as noted above.     Physical Exam    VS:  There were no vitals taken for this visit. , BMI There is no height or weight on file to calculate BMI.     GEN: Well nourished, well developed, in no acute distress. HEENT: normal. Neck: Supple, no JVD, carotid bruits, or masses. Cardiac: RRR, no murmurs, rubs, or gallops. No clubbing, cyanosis, edema.  Radials/DP/PT 2+ and equal bilaterally.  Respiratory:  Respirations regular and unlabored, clear to auscultation bilaterally. GI: Soft, nontender, nondistended, BS + x 4. MS: no deformity or atrophy. Skin: warm and dry, no rash. Neuro:  Strength and sensation are intact. Psych: Normal affect.  Accessory Clinical Findings    ECG personally reviewed by me today- *** - No acute changes  Lab Results  Component Value Date   WBC 6.6 03/01/2022   HGB 11.0 (L) 03/01/2022   HCT 34.7 (L) 03/01/2022   MCV 75.3 (L) 03/01/2022   PLT 264 03/01/2022   Lab Results  Component Value Date   CREATININE 0.96 03/01/2022   BUN 10 03/01/2022   NA 140 03/01/2022   K 3.5 03/01/2022   CL 113 (H) 03/01/2022   CO2 24 03/01/2022   Lab Results   Component Value  Date   ALT 10 02/27/2022   AST 11 (L) 02/27/2022   ALKPHOS 56 02/27/2022   BILITOT 0.5 02/27/2022   No results found for: "CHOL", "HDL", "LDLCALC", "LDLDIRECT", "TRIG", "CHOLHDL"  No results found for: "HGBA1C"  Assessment & Plan   1.  ***  Camille Dragan, NP 03/27/2022, 2:58 PM

## 2022-03-27 NOTE — Progress Notes (Signed)
[place] at [facility] [phone] [phone]   Name: Clarence Skinner Date: 03/27/2022 MRN: [id number]  DOB: [birth year]  Patient Care Team: Mechele Claude, FNP as PCP - General (Family Medicine)    REASON FOR CONSULTATION: Clarence Skinner is a 61 y.o. 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  Plan is for concurrent chemoradiation.  Patient has had ongoing neck pain with imaging revealing degenerative disease.  He was referred to palliative care to address goals and manage ongoing symptoms.  SOCIAL HISTORY:     reports that he has been smoking cigarettes. He has a 30.00 pack-year smoking history. He quit smokeless tobacco use about 37 years ago.  His smokeless tobacco use included snuff and chew. He reports current alcohol use of about 12.0 standard drinks of alcohol per week. He reports that he does not currently use drugs.  Patient lives at home with his significant other and her daughter.  Patient has no biological children.  He formally worked as a Sales executive.  ADVANCE DIRECTIVES:    CODE STATUS:   PAST MEDICAL HISTORY: Past Medical History:  Diagnosis Date   COPD (chronic obstructive pulmonary disease) ([place])    GERD (gastroesophageal reflux disease)    Gout    Hypertension    Multilevel degenerative disc disease    Pneumonia 11/29/2017   Sleep apnea    CPAP   Tachycardia    Wears dentures    partial upper and lower    PAST SURGICAL HISTORY:  Past Surgical History:  Procedure Laterality Date   ABCESS DRAINAGE  2009   tonsil   COLONOSCOPY WITH PROPOFOL N/A 02/11/2018   Procedure: COLONOSCOPY WITH PROPOFOL;  Surgeon: Toledo, Benay Pike, MD;  Location: [facility] ENDOSCOPY;  Service: Endoscopy;  Laterality: N/A;   ESOPHAGOGASTRODUODENOSCOPY (EGD) WITH PROPOFOL N/A  02/11/2018   Procedure: ESOPHAGOGASTRODUODENOSCOPY (EGD) WITH PROPOFOL;  Surgeon: Toledo, Benay Pike, MD;  Location: [facility] ENDOSCOPY;  Service: Endoscopy;  Laterality: N/A;   MASS EXCISION Right 08/14/2017   Procedure: EXCISION RIGHT TEMPORAL MASS SUBMENTAL MASS;  Surgeon: Carloyn Manner, MD;  Location: [place];  Service: ENT;  Laterality: Right;  sleep apnea   RADIOACTIVE SEED IMPLANT N/A 01/26/2019   Procedure: RADIOACTIVE SEED IMPLANT/BRACHYTHERAPY IMPLANT;  Surgeon: Billey Co, MD;  Location: [facility] ORS;  Service: Urology;  Laterality: N/A;   SEPTOPLASTY  2016   [place], [place]   VOLUME STUDY N/A 12/29/2018   Procedure: VOLUME STUDY;  Surgeon: Billey Co, MD;  Location: [facility] ORS;  Service: Urology;  Laterality: N/A;    HEMATOLOGY/ONCOLOGY HISTORY:  Oncology History  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[place])  02/26/2022 Imaging   CTA neck showed 1, 4.3 x 2.5 cm ovoid soft tissue focus at the right level II/III stations (along the posterior aspect of the proximal ICA, carotid bifurcation and distal CCA). This likely reflects an enlarged lymph node.  2 Abnormal soft tissue surrounding the mid-to-distal cervical right ICA with resultant mild vessel narrowing 3 Atherosclerotic plaque within the bilateral common carotid and cervical internal carotid arteries without hemodynamically significant stenosis. 4 Vertebral arteries patent within the neck. Mild-to-moderate atherosclerotic narrowing at the origin of the right vertebral artery. No more than mild atherosclerotic stenosis at the origin of the left vertebral artery. 5 Aortic Atherosclerosis and Emphysema 6. Cervical spondylosis   02/27/2022 Imaging   MRI neck soft  tissue w wo Constellation of findings most compatible with a roughly 2.4 cm Right Tonsillar Carcinoma (series 12, image 11) with associated right level 2 and level 3 malignant nodal, ex-nodal disease (series 14, image 13). Recommend ENT consultation     02/27/2022 Imaging   02/27/2022 MRI brain without contrast showed  No acute intracranial abnormality. Abnormal right neck soft tissues. See dedicated Neck MRI today reported separately. 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 These are slow flow vascular malformations which are generally asymptomatic. [name] Other moderately advanced but nonspecific cerebral white matter signal changes.   02/28/2022 Initial Diagnosis   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[place])  02/28/2022, status post ultrasound core biopsy. Metastatic squamous cell carcinoma. Moderately differentiated with focal keratinization.  p16 negative.    02/28/2022 Cancer Staging   Staging form: Pharynx - P16 Negative Oropharynx, AJCC 8th Edition - Clinical stage from 02/28/2022: Stage IVA (cT2, cN2b, cM0, p16-) - Signed by [name], MD on 03/22/2022 Stage prefix: Initial diagnosis   03/19/2022 Imaging   PET scan showed Signs of RIGHT neck malignancy with metastatic involvement just below the skull base and at level II/III also on the RIGHT. No signs of contralateral nodal involvement.   03/28/2022 -  Chemotherapy   Patient is on Treatment Plan : HEAD/NECK Carboplatin (AUC 2) q7d       ALLERGIES:  is allergic to chocolate, eggs or egg-derived products, milk (cow), benadryl [diphenhydramine], and milk-related compounds.  MEDICATIONS:  Current Outpatient Medications  Medication Sig Dispense Refill   albuterol (PROVENTIL HFA;VENTOLIN HFA) 108 (90 Base) MCG/ACT inhaler Inhale into the lungs every 6 (six) hours as needed for wheezing or shortness of breath.     allopurinol (ZYLOPRIM) 300 MG tablet Take 300 mg by mouth daily.     amLODipine-benazepril (LOTREL) 10-40 MG capsule Take 1 capsule by mouth daily.     aspirin EC 81 MG tablet Take 1 tablet (81 mg total) by mouth daily. Swallow whole. 90 tablet 3   Azelastine HCl 137 MCG/SPRAY SOLN Place 1 spray into the  nose daily as needed (allergies).      baclofen (LIORESAL) 10 MG tablet Take 10 mg by mouth 2 (two) times daily as needed.     benzonatate (TESSALON) 100 MG capsule Take 100 mg by mouth every 8 (eight) hours as needed.     ferrous sulfate 325 (65 FE) MG tablet Take 325 mg by mouth daily.  3   fluticasone (FLONASE) 50 MCG/ACT nasal spray Place into both nostrils daily as needed for allergies or rhinitis.     Fluticasone-Umeclidin-Vilant (TRELEGY ELLIPTA) 100-62.5-25 MCG/ACT AEPB Trelegy Ellipta 100-62.5-25 MCG/INH Inhalation Aerosol Powder Breath Activated QTY: 90 each Days: 90 Refills: 1  Written: 11/02/20 Patient Instructions: One puff once a day     furosemide (LASIX) 20 MG tablet Take 20 mg by mouth daily.     loratadine (CLARITIN) 10 MG tablet Take 10 mg by mouth daily.     meloxicam (MOBIC) 7.5 MG tablet Take 7.5 mg by mouth daily as needed for pain.     Oxycodone HCl 10 MG TABS Take 10 mg by mouth every 6 (six) hours as needed.     pantoprazole (PROTONIX) 40 MG tablet Take 40 mg by mouth daily.     prochlorperazine (COMPAZINE) 10 MG tablet Take 1 tablet (10 mg total) by mouth every 6 (six) hours as needed for nausea or vomiting. 30 tablet 1   rosuvastatin (  CRESTOR) 20 MG tablet Take 1 tablet (20 mg total) by mouth daily. 30 tablet 3   SYMBICORT 80-4.5 MCG/ACT inhaler Inhale 1 puff into the lungs 2 (two) times daily.     tadalafil (CIALIS) 20 MG tablet Take 1 tablet (20 mg total) by mouth daily. 30 tablet 11   Vitamin D, Ergocalciferol, (DRISDOL) 1.25 MG (50000 UNIT) CAPS capsule Take 50,000 Units by mouth once a week.     No current facility-administered medications for this visit.    VITAL SIGNS: There were no vitals taken for this visit. There were no vitals filed for this visit.  Estimated body mass index is 23.53 kg/m as calculated from the following:   Height as of 03/15/22: '5\' 11"'$  (1.803 m).   Weight as of 03/22/22: 168 lb 11.2 oz (76.5 kg).  LABS: CBC:    Component Value  Date/Time   WBC 6.6 03/01/2022 0515   HGB 11.0 (L) 03/01/2022 0515   HGB 12.1 (L) 06/17/2013 1305   HCT 34.7 (L) 03/01/2022 0515   HCT 36.8 (L) 06/17/2013 1305   PLT 264 03/01/2022 0515   PLT 246 06/17/2013 1305   MCV 75.3 (L) 03/01/2022 0515   MCV 80 06/17/2013 1305   NEUTROABS 3.6 02/26/2022 1504   NEUTROABS 6.5 06/17/2013 1305   LYMPHSABS 2.6 02/26/2022 1504   LYMPHSABS 2.5 06/17/2013 1305   MONOABS 0.5 02/26/2022 1504   MONOABS 0.7 06/17/2013 1305   EOSABS 0.1 02/26/2022 1504   EOSABS 0.2 06/17/2013 1305   BASOSABS 0.1 02/26/2022 1504   BASOSABS 0.1 06/17/2013 1305   Comprehensive Metabolic Panel:    Component Value Date/Time   NA 140 03/01/2022 0515   NA 138 10/03/2012 1843   K 3.5 03/01/2022 0515   K 3.5 10/03/2012 1843   CL 113 (H) 03/01/2022 0515   CL 106 10/03/2012 1843   CO2 24 03/01/2022 0515   CO2 27 10/03/2012 1843   BUN 10 03/01/2022 0515   BUN 10 10/03/2012 1843   CREATININE 0.96 03/01/2022 0515   CREATININE 1.04 10/03/2012 1843   GLUCOSE 93 03/01/2022 0515   GLUCOSE 134 (H) 10/03/2012 1843   CALCIUM 8.1 (L) 03/01/2022 0515   CALCIUM 8.5 10/03/2012 1843   AST 11 (L) 02/27/2022 0446   ALT 10 02/27/2022 0446   ALKPHOS 56 02/27/2022 0446   BILITOT 0.5 02/27/2022 0446   PROT 6.1 (L) 02/27/2022 0446   ALBUMIN 3.4 (L) 02/27/2022 0446    RADIOGRAPHIC STUDIES: NM PET Image Initial (PI) Skull Base To Thigh (F-18 FDG)  Result Date: 03/19/2022 CLINICAL DATA:  Initial treatment strategy for head neck cancer. EXAM: NUCLEAR MEDICINE PET SKULL BASE TO THIGH TECHNIQUE: 8.62 mCi F-18 FDG was injected intravenously. Full-ring PET imaging was performed from the skull base to thigh after the radiotracer. CT data was obtained and used for attenuation correction and anatomic localization. Fasting blood glucose: 112 mg/dl COMPARISON:  Neck MRI from February 27, 2022 and CT angiography of the head and neck which was also performed in August of 2023. FINDINGS: Mediastinal blood  pool activity: SUV max 2.18 Liver activity: SUV max NA NECK: Known tonsillar mass with loss of normal soft tissue planes about the RIGHT oropharynx and parapharyngeal space (image 24/4) measuring approximately 2.1 cm with a maximum SUV of 18.98. At the RIGHT skull base is another area of increased metabolic activity corresponding nodal disease that is better illustrated on the previous neck MRI (image 16/4) just below the jugular foramen and associated with the RIGHT styloid  process this measures approximately 16 mm with a maximum SUV of 17.55 (Image 30/4) large RIGHT neck mass with indistinct margins at level II/III measuring 3.5 x 2.6 cm with a maximum SUV of 17.99 No contralateral nodal disease. Incidental CT findings: Small cutaneous lesion suggested on image 26/4) 10 mm without signs of increased metabolic activity. CHEST: No hypermetabolic mediastinal or hilar nodes. No suspicious pulmonary nodules on the CT scan. Incidental CT findings: Aortic atherosclerosis with calcification. Normal heart size. No pericardial effusion. No adenopathy by size criteria in the chest. Signs of moderate pulmonary emphysema at the lung apices. No effusion, consolidation or airway abnormality. ABDOMEN/PELVIS: No abnormal hypermetabolic activity within the liver, pancreas, adrenal glands, or spleen. No hypermetabolic lymph nodes in the abdomen or pelvis. Incidental CT findings: Liver with smooth contours. No acute process related to the gallbladder, pancreas, spleen, adrenal glands, kidneys, urinary bladder, stomach, small or large bowel. The appendix is normal. Aortic atherosclerosis. No aneurysmal dilation. No adenopathy in the abdomen or in the pelvis by size criteria. 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 SKELETON: No focal hypermetabolic activity to suggest skeletal metastasis. Facet arthropathy on the RIGHT at C6-C7 which is asymmetric to contralateral C6-C7 displays FDG uptake which is asymmetric but favored to be related to  degenerative process. Maximum SUV 4.6. Incidental CT findings: None. IMPRESSION: Signs of RIGHT neck malignancy with metastatic involvement just below the skull base and at level II/III also on the RIGHT. No signs of contralateral nodal involvement. No signs of distant metastatic disease. Electronically Signed   By: Zetta Bills M.D.   On: 03/19/2022 15:45   [place] CORE BIOPSY (LYMPH NODES)  Result Date: 02/28/2022 INDICATION: Tonsillar mass and right neck lymphadenopathy EXAM: Ultrasound-guided biopsy of right neck lymph node MEDICATIONS: None. COMPLICATIONS: None immediate. PROCEDURE: Informed written consent was obtained from the patient after a thorough discussion of the procedural risks, benefits and alternatives. All questions were addressed. Maximal Sterile Barrier Technique was utilized including caps, mask, sterile gowns, sterile gloves, sterile drape, hand hygiene and skin antiseptic. A timeout was performed prior to the initiation of the procedure. Patient position supine on the ultrasound table. Right neck skin prepped and draped in usual sterile fashion. Following local lidocaine administration, four 18 gauge cores were obtained from the enlarged right neck lymph node utilizing continuous ultrasound guidance. Samples were sent to pathology in sterile saline. Needle removed and hemostasis achieved with 1 minutes of manual compression. Post procedure ultrasound images showed no evidence of significant hemorrhage. IMPRESSION: Ultrasound-guided biopsy of enlarged right neck lymph node. Electronically Signed   By: Miachel Roux M.D.   On: 02/28/2022 15:43   ECHOCARDIOGRAM COMPLETE BUBBLE STUDY  Result Date: 02/28/2022    ECHOCARDIOGRAM REPORT   Patient Name:   [facility] Date of Exam: 02/28/2022 Medical Rec #:  [id number]      Height:       69.0 in Accession #:    [id number]     Weight:       185.6 lb Date of Birth:  [birth year]      BSA:          2.002 m Patient Age:    28 years       BP:            102/52 mmHg Patient Gender: M              HR:           62 bpm. Exam Location:  [facility] Procedure: 2D Echo,  Cardiac Doppler, Color Doppler and Saline Contrast Bubble            Study Indications:     Syncope 780.2 / R55  History:         Patient has no prior history of Echocardiogram examinations.                  COPD; Risk Factors:Hypertension.  Sonographer:     Sherrie Sport Referring Phys:  [place] Diagnosing Phys: Serafina Royals MD  Sonographer Comments: Suboptimal apical window. IMPRESSIONS  1. Left ventricular ejection fraction, by estimation, is 60 to 65%. The left ventricle has normal function. The left ventricle has no regional wall motion abnormalities. Left ventricular diastolic parameters were normal.  2. Right ventricular systolic function is normal. The right ventricular size is normal.  3. The mitral valve is normal in structure. Mild to moderate mitral valve regurgitation.  4. Tricuspid valve regurgitation is moderate.  5. The aortic valve is normal in structure. Aortic valve regurgitation is not visualized. FINDINGS  Left Ventricle: Left ventricular ejection fraction, by estimation, is 60 to 65%. The left ventricle has normal function. The left ventricle has no regional wall motion abnormalities. The left ventricular internal cavity size was normal in size. There is  no left ventricular hypertrophy. Left ventricular diastolic parameters were normal. Right Ventricle: The right ventricular size is normal. No increase in right ventricular wall thickness. Right ventricular systolic function is normal. Left Atrium: Left atrial size was normal in size. Right Atrium: Right atrial size was normal in size. Pericardium: There is no evidence of pericardial effusion. Mitral Valve: The mitral valve is normal in structure. Mild to moderate mitral valve regurgitation. Tricuspid Valve: The tricuspid valve is normal in structure. Tricuspid valve regurgitation is moderate. Aortic Valve: The aortic valve is  normal in structure. Aortic valve regurgitation is not visualized. Aortic valve mean gradient measures 2.0 mmHg. Aortic valve peak gradient measures 3.3 mmHg. Aortic valve area, by VTI measures 2.36 cm. Pulmonic Valve: The pulmonic valve was normal in structure. Pulmonic valve regurgitation is not visualized. Aorta: The aortic root and ascending aorta are structurally normal, with no evidence of dilitation. IAS/Shunts: No atrial level shunt detected by color flow Doppler. Agitated saline contrast was given intravenously to evaluate for intracardiac shunting.  LEFT VENTRICLE PLAX 2D LVIDd:         4.60 cm   Diastology LVIDs:         3.20 cm   LV e' medial:    10.40 cm/s LV PW:         1.10 cm   LV E/e' medial:  4.2 LV IVS:        0.90 cm   LV e' lateral:   11.40 cm/s LVOT diam:     2.00 cm   LV E/e' lateral: 3.9 LV SV:         46 LV SV Index:   23 LVOT Area:     3.14 cm  RIGHT VENTRICLE RV Basal diam:  3.90 cm RV S prime:     12.90 cm/s TAPSE (M-mode): 2.0 cm LEFT ATRIUM             Index        RIGHT ATRIUM           Index LA diam:        3.00 cm 1.50 cm/m   RA Area:     18.60 cm LA Vol (A2C):   45.3 ml  22.63 ml/m  RA Volume:   55.60 ml  27.78 ml/m LA Vol (A4C):   38.4 ml 19.19 ml/m LA Biplane Vol: 44.3 ml 22.13 ml/m  AORTIC VALVE AV Area (Vmax):    2.02 cm AV Area (Vmean):   2.20 cm AV Area (VTI):     2.36 cm AV Vmax:           91.10 cm/s AV Vmean:          58.750 cm/s AV VTI:            0.194 m AV Peak Grad:      3.3 mmHg AV Mean Grad:      2.0 mmHg LVOT Vmax:         58.70 cm/s LVOT Vmean:        41.100 cm/s LVOT VTI:          0.146 m LVOT/AV VTI ratio: 0.75  AORTA Ao Root diam: 2.90 cm MITRAL VALVE               TRICUSPID VALVE MV Area (PHT): 2.82 cm    TR Peak grad:   27.2 mmHg MV Decel Time: 269 msec    TR Vmax:        261.00 cm/s MV E velocity: 44.10 cm/s MV A velocity: 97.30 cm/s  SHUNTS MV E/A ratio:  0.45        Systemic VTI:  0.15 m                            Systemic Diam: 2.00 cm Serafina Royals MD Electronically signed by Serafina Royals MD Signature Date/Time: 02/28/2022/12:51:18 PM    Final    US Carotid Bilateral  Result Date: 02/27/2022 CLINICAL DATA:  61 year old male with a history of syncope EXAM: BILATERAL CAROTID DUPLEX ULTRASOUND TECHNIQUE: Pearline Cables scale imaging, color Doppler and duplex ultrasound were performed of bilateral carotid and vertebral arteries in the neck. COMPARISON:  Neuro imaging of the same day and 1 day prior FINDINGS: Criteria: Quantification of carotid stenosis is based on velocity parameters that correlate the residual internal carotid diameter with NASCET-based stenosis levels, using the diameter of the distal internal carotid lumen as the denominator for stenosis measurement. The following velocity measurements were obtained: RIGHT ICA:  Systolic 536 cm/sec, Diastolic not recorded CCA:  57 cm/sec SYSTOLIC ICA/CCA RATIO:  2.0 ECA:  149 cm/sec LEFT ICA:  Systolic 62 cm/sec, Diastolic not recorded CCA:  48 cm/sec SYSTOLIC ICA/CCA RATIO:  1.3 ECA:  118 cm/sec Right Brachial SBP: Not acquired Left Brachial SBP: Not acquired RIGHT CAROTID ARTERY: No significant calcifications of the right common carotid artery. Intermediate waveform maintained. Heterogeneous and partially calcified plaque at the right carotid bifurcation. No significant lumen shadowing. Low resistance waveform of the right ICA. No significant tortuosity. RIGHT VERTEBRAL ARTERY: Antegrade flow with low resistance waveform. LEFT CAROTID ARTERY: No significant calcifications of the left common carotid artery. Intermediate waveform maintained. Heterogeneous and partially calcified plaque at the left carotid bifurcation without significant lumen shadowing. Low resistance waveform of the left ICA. No significant tortuosity. LEFT VERTEBRAL ARTERY:  Antegrade flow with low resistance waveform. Additional: Incidental imaging of pathologic lymphadenopathy of the right neck, better characterized on recent  cross-sectional neuro imaging. IMPRESSION: Color duplex indicates minimal heterogeneous and calcified plaque, with no hemodynamically significant stenosis by duplex criteria in the extracranial cerebrovascular circulation. Pathologic lymphadenopathy of the neck better characterized on recent neuro imaging. Signed, Dulcy Fanny. Earleen Newport,  DO, ABVM, RPVI [facility] Radiology Electronically Signed   By: Corrie Mckusick D.O.   On: 02/27/2022 14:02   MR NECK SOFT TISSUE ONLY W WO CONTRAST  Result Date: 02/27/2022 CLINICAL DATA:  61 year old male with dizziness. Neck pain. Right neck level 2/3 soft tissue mass on CTA neck yesterday thought to be abnormal lymph nodes. EXAM: MRI OF THE NECK WITH CONTRAST TECHNIQUE: Multiplanar, multisequence MR imaging was performed following the administration of intravenous contrast. CONTRAST:  31m GADAVIST GADOBUTROL 1 MMOL/ML IV SOLN COMPARISON:  CTA head and neck yesterday. Brain MRI today reported separately. FINDINGS: Pharynx and larynx: Larynx is within normal limits. Vallecula and hypopharynx within normal limits. Asymmetric although fairly subtle 15 mm area of masslike T2 hyperintensity and homogeneous enhancement along the posterior and lateral wall of the oropharynx on the right (series 12, image 11 and series 18, image 11). This area has abnormal diffusion on coronal brain DWI today. This is just below the soft palate and uvula which appear to remain normal. The mass is up to 2.4 cm long axis on series 14, image 12. Nasopharynx contour is within normal limits. Midline retropharyngeal space and bilateral parapharyngeal spaces are within normal limits. Salivary glands: Negative except for mild mass effect on the right submandibular gland, see nodal findings below. Thyroid: Negative. Lymph nodes: Heterogeneous T2 hyperintense and enhancing soft tissue mass posterior to the right carotid space at the junction of the right level 2 B and 3B  nodal station is 23 x 34 x 45 mm (AP by transverse by CC). This is inseparable from the posterior carotid space. And a 2nd smaller area of similar asymmetric T2 heterogeneous soft tissue and enhancement just below the skull base (series 12, image 6 and also series 13, image 14) is roughly 16 x 14 x 30 mm (AP by transverse by CC) and mildly narrow due the adjacent right ICA on the CTA yesterday. FBurtis Junesthis is malignant nodal versus ex nodal disease. No other cervical lymphadenopathy identified. Vascular: Maintained vascular flow voids in the neck. See CTA findings yesterday. Limited intracranial: Brain MRI today reported separately. Visualized orbits: Not included. Mastoids and visualized paranasal sinuses: Well aerated. Skeleton: Cervical spine degeneration including disc disease and degenerative appearing anterolisthesis of C3 on C4 with facet degeneration. No osseous metastatic disease identified. No cervical spine dural thickening. Cervical spinal cord appears to be normal. Upper chest: Better demonstrated on CTA yesterday. 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 Recommend ENT consultation. Electronically Signed   By: HGenevie AnnM.D.   On: 02/27/2022 08:32   MR BRAIN WO CONTRAST  Result Date: 02/27/2022 CLINICAL DATA:  61year old male with syncope, dizziness. EXAM: MRI HEAD WITHOUT CONTRAST TECHNIQUE: Multiplanar, multiecho pulse sequences of the brain and surrounding structures were obtained without intravenous contrast. COMPARISON:  CTA head and neck yesterday. FINDINGS: Brain: No restricted diffusion to suggest acute infarction. No midline shift, mass effect, evidence of mass lesion, ventriculomegaly, extra-axial collection or acute intracranial hemorrhage. Cervicomedullary junction and pituitary are within normal limits. Patchy,  widely scattered bilateral cerebral white matter T2 and FLAIR hyperintensity. This is in a nonspecific pattern. No cortical encephalomalacia identified. In the subcortical white matter of the left superior frontal gyrus posteriorly there is a small T2 heterogeneous 6-7 mm lesion (series 10, image 19) with pronounced hemosiderin on  SWI (series 13, image 42). No regional edema or mass effect. No other chronic cerebral blood products. Deep gray nuclei, brainstem and cerebellum are within normal limits. Vascular: Major intracranial vascular flow voids are preserved. Skull and upper cervical spine: Negative visible cervical spine. Visualized bone marrow signal is within normal limits. Sinuses/Orbits: Negative orbits. Paranasal sinuses and mastoids are stable and well aerated. Other: There is is 3 cm segment of abnormal diffusion in the right upper neck just below the skull base (series 7, image 22. And similar asymmetric diffusion along the right posterior pharynx series 7, image 23. See dedicated Neck MRI today reported separately. IMPRESSION: 1. No acute intracranial abnormality. Abnormal right neck soft tissues. See dedicated Neck MRI today reported separately. 2. 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 These are slow flow vascular malformations which are generally asymptomatic. 3. Other moderately advanced but nonspecific cerebral white matter signal changes. Electronically Signed   By: Genevie Ann M.D.   On: 02/27/2022 08:22   CT ANGIO HEAD NECK W WO CM  Result Date: 02/26/2022 CLINICAL DATA:  Provided history: Dizziness, persistent/recurrent, cardiac or vascular cause suspected; recurrent syncope, neck pain, severe. EXAM: CT ANGIOGRAPHY HEAD AND NECK TECHNIQUE: Multidetector CT imaging of the head and neck was performed using the standard protocol during bolus administration of intravenous contrast. Multiplanar CT image reconstructions  and MIPs were obtained to evaluate the vascular anatomy. Carotid stenosis measurements (when applicable) are obtained utilizing NASCET criteria, using the distal internal carotid diameter as the denominator. RADIATION DOSE REDUCTION: This exam was performed according to the departmental dose-optimization program which includes automated exposure control, adjustment of the mA and/or kV according to patient size and/or use of iterative reconstruction technique. CONTRAST:  33m OMNIPAQUE IOHEXOL 350 MG/ML SOLN COMPARISON:  Head CT 06/18/2011. Report from brain MRI 05/12/1996 (images unavailable). FINDINGS: CT HEAD FINDINGS Brain: Mild generalized parenchymal atrophy. Mild patchy and ill-defined hypoattenuation within the cerebral white matter, nonspecific but most often secondary to chronic small vessel ischemia. There is no acute intracranial hemorrhage. No demarcated cortical infarct. No extra-axial fluid collection. No evidence of an intracranial mass. No midline shift. Vascular: No hyperdense vessel. Atherosclerotic calcifications. Skull: No fracture or aggressive osseous lesion. Sinuses/Orbits: No orbital mass or acute orbital finding. Mild mucosal thickening within the right frontal, bilateral ethmoid and bilateral maxillary sinuses. Review of the MIP images confirms the above findings CTA NECK FINDINGS Aortic arch: Standard aortic branching. Atherosclerotic plaque within the visualized aortic arch and proximal major branch vessels of the neck. Streak and beam hardening artifact arising from a dense left-sided contrast bolus partially obscures the left subclavian artery. Within this limitation, there is no appreciable hemodynamically significant innominate or proximal subclavian artery stenosis. Right carotid system: CCA and ICA patent within the neck. Atherosclerotic plaque within the proximal ICA, resulting in less than 50% stenosis. There is abnormal soft tissue surrounding the mid-to-distal cervical ICA with  associated mild narrowing of the ICA at these levels. Left carotid system: CCA and ICA patent within the neck without stenosis. Mild atherosclerotic plaque at the CCA origin and about the carotid bifurcation. Vertebral arteries: Vertebral arteries patent within the neck. Mild to moderate atherosclerotic narrowing at the origin of the right vertebral artery. Calcified plaque at the origin of the left vertebral artery with no more than mild stenosis. Skeleton: Reversal of the expected cervical lordosis. Cervical spondylosis. No acute fracture or aggressive osseous lesion. Other neck: 4.3 x 2.5 cm  ovoid soft tissue focus at the right level II/level III stations (along the posterior aspect of the proximal ICA, carotid bifurcation and distal CCA). This likely reflects an enlarged lymph node (for instance as seen on series 10, image 223) (series 13, image 90). Subcentimeter thyroid nodules, not meeting consensus criteria for ultrasound follow-up based on size. Reference: J Am Coll Radiol. 2015 Feb;12(2): 143-50. Upper chest: Centrilobular and paraseptal emphysema. No consolidation within the imaged lung apices. Review of the MIP images confirms the above findings CTA HEAD FINDINGS Anterior circulation: The intracranial internal carotid arteries are patent. The M1 middle cerebral arteries are patent. No M2 proximal branch occlusion or high-grade proximal stenosis is identified. Atherosclerotic irregularity of the M2 and more distal MCA vessels, bilaterally. The anterior cerebral arteries are patent. No intracranial aneurysm is identified. Posterior circulation: The intracranial vertebral arteries are patent. The basilar artery is patent. The posterior cerebral arteries are patent. A left posterior communicating artery is present. The right posterior communicating artery is diminutive or absent. Venous sinuses: Within the limitations of contrast timing, no convincing thrombus. Anatomic variants: As described. Review of the  MIP images confirms the above findings IMPRESSION: CT head: 1. No evidence of acute intracranial abnormality. 2. Mild patchy and ill-defined hypoattenuation within the cerebral white matter, nonspecific but most often secondary to chronic small vessel ischemia. 3. Mild paranasal sinus mucosal thickening. CTA neck: 1. 4.3 x 2.5 cm ovoid soft tissue focus at the right level II/III stations (along the posterior aspect of the proximal ICA, carotid bifurcation and distal CCA). This likely reflects an enlarged lymph node. Given the size, this is concerning for nodal metastatic disease, although a reactive/inflammatory lymph node is possible. 2. Abnormal soft tissue surrounding the mid-to-distal cervical right ICA with resultant mild vessel narrowing. Findings are nonspecific, but may reflect a nodal mass or sequela of a vasculopathy. A contrast-enhanced neck CT is recommended for initial further evaluation of this finding, and for initial further evaluation of the finding described in impression #1. 3. Atherosclerotic plaque within the bilateral common carotid and cervical internal carotid arteries without hemodynamically significant stenosis. 4. Vertebral arteries patent within the neck. Mild-to-moderate atherosclerotic narrowing at the origin of the right vertebral artery. No more than mild atherosclerotic stenosis at the origin of the left vertebral artery. 5. Aortic Atherosclerosis (ICD10-I70.0) and Emphysema (ICD10-J43.9). 6. Cervical spondylosis. CTA head: Intracranial atherosclerotic disease without intracranial large vessel occlusion or proximal high-grade arterial stenosis. Electronically Signed   By: Kellie Simmering D.O.   On: 02/26/2022 17:13   DG Chest Portable 1 View  Result Date: 02/26/2022 CLINICAL DATA:  Syncope EXAM: PORTABLE CHEST 1 VIEW COMPARISON:  February 13, 2022, April 07, 2018 FINDINGS: The heart size and mediastinal contours are within normal limits. Both lungs are clear. The visualized skeletal  structures are unremarkable. IMPRESSION: No acute cardiopulmonary abnormality. Electronically Signed   By: Beryle Flock M.D.   On: 02/26/2022 15:37    PERFORMANCE STATUS (ECOG) : 1 - Symptomatic but completely ambulatory  Review of Systems Unless otherwise noted, a complete review of systems is negative.  Physical Exam General: NAD Pulmonary: Unlabored Extremities: no edema, no joint deformities Skin: no rashes Neurological: Weakness but otherwise nonfocal  IMPRESSION: At baseline, patient lives at home with his significant other.  He is functionally independent with his own care.  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  However, he has had worse pain in the jaw, which radiates down to the neck since he had  his biopsy.  Pain is described as sharp and burning and often is preceded by headache.  He reports facial tenderness but denies swelling.  No reported ulcers or oral lesions.  Additionally, patient has had some dysphagia and odynophagia but at this point is able to eat foods without dietary modifications.  At its worst, patient rates pain as 10 out of 10.  He is taking oxycodone every 6 hours and finds that that lowers the pain to 6-7 out of 10, which she describes as being tolerable.  Patient endorses constipation, likely secondary to opioids.  He says that his PCP recently recommended addition of a powder (probably MiraLAX) daily.  I suggested that he consider adding Senokot and discussed titration parameters.  PLAN: -Continue current scope of treatment -Continue oxycodone as needed for pain -Start gabapentin 100 mg nightly, titrate as tolerated -Start sucralfate -Daily PPI -Referral to SLP -Referral to nutrition -Follow-up telephone visit 2 weeks   Patient expressed understanding and was in agreement with this plan. He also understands that He can call the clinic at any time with any questions, concerns, or complaints.     Time  Total: 20 minutes  Visit consisted of counseling and education dealing with the complex and emotionally intense issues of symptom management and palliative care in the setting of serious and potentially life-threatening illness.Greater than 50%  of this time was spent counseling and coordinating care related to the above assessment and plan.  Signed by: Altha Harm, PhD, NP-C

## 2022-03-27 NOTE — Telephone Encounter (Signed)
Called and spoke to a nurse who informed me that a fax was sent on 03/21/22. I informed her that I did not receive it, and requested it be faxed again.

## 2022-03-28 ENCOUNTER — Inpatient Hospital Stay: Payer: Medicaid Other

## 2022-03-28 ENCOUNTER — Other Ambulatory Visit: Payer: Self-pay

## 2022-03-28 ENCOUNTER — Inpatient Hospital Stay (HOSPITAL_BASED_OUTPATIENT_CLINIC_OR_DEPARTMENT_OTHER): Payer: Medicaid Other | Admitting: Oncology

## 2022-03-28 ENCOUNTER — Inpatient Hospital Stay (HOSPITAL_BASED_OUTPATIENT_CLINIC_OR_DEPARTMENT_OTHER): Payer: Medicaid Other | Admitting: Hospice and Palliative Medicine

## 2022-03-28 ENCOUNTER — Ambulatory Visit: Payer: Medicaid Other | Admitting: Oncology

## 2022-03-28 ENCOUNTER — Ambulatory Visit: Payer: Medicaid Other

## 2022-03-28 ENCOUNTER — Telehealth: Payer: Self-pay | Admitting: Cardiology

## 2022-03-28 ENCOUNTER — Encounter: Payer: Self-pay | Admitting: Oncology

## 2022-03-28 ENCOUNTER — Ambulatory Visit
Admission: RE | Admit: 2022-03-28 | Discharge: 2022-03-28 | Disposition: A | Discharge status: Home / Self Care | Payer: Medicaid Other | Source: Ambulatory Visit | Attending: Radiation Oncology | Admitting: Radiation Oncology

## 2022-03-28 DIAGNOSIS — G9001 Carotid sinus syncope: Secondary | ICD-10-CM | POA: Diagnosis not present

## 2022-03-28 DIAGNOSIS — C099 Malignant neoplasm of tonsil, unspecified: Secondary | ICD-10-CM

## 2022-03-28 DIAGNOSIS — G893 Neoplasm related pain (acute) (chronic): Secondary | ICD-10-CM | POA: Diagnosis not present

## 2022-03-28 DIAGNOSIS — C770 Secondary and unspecified malignant neoplasm of lymph nodes of head, face and neck: Secondary | ICD-10-CM | POA: Diagnosis not present

## 2022-03-28 DIAGNOSIS — K1230 Oral mucositis (ulcerative), unspecified: Secondary | ICD-10-CM | POA: Diagnosis not present

## 2022-03-28 DIAGNOSIS — Z515 Encounter for palliative care: Secondary | ICD-10-CM | POA: Diagnosis not present

## 2022-03-28 DIAGNOSIS — Z20822 Contact with and (suspected) exposure to covid-19: Secondary | ICD-10-CM | POA: Diagnosis not present

## 2022-03-28 DIAGNOSIS — Z510 Encounter for antineoplastic radiation therapy: Secondary | ICD-10-CM | POA: Diagnosis not present

## 2022-03-28 DIAGNOSIS — F17210 Nicotine dependence, cigarettes, uncomplicated: Secondary | ICD-10-CM | POA: Diagnosis not present

## 2022-03-28 DIAGNOSIS — Z5111 Encounter for antineoplastic chemotherapy: Secondary | ICD-10-CM | POA: Diagnosis not present

## 2022-03-28 DIAGNOSIS — M542 Cervicalgia: Secondary | ICD-10-CM

## 2022-03-28 LAB — CBC WITH DIFFERENTIAL/PLATELET
Abs Immature Granulocytes: 0.02 10*3/uL (ref 0.00–0.07)
Basophils Absolute: 0.1 10*3/uL (ref 0.0–0.1)
Basophils Relative: 1 %
Eosinophils Absolute: 0.1 10*3/uL (ref 0.0–0.5)
Eosinophils Relative: 1 %
HCT: 43.4 % (ref 39.0–52.0)
Hemoglobin: 14 g/dL (ref 13.0–17.0)
Immature Granulocytes: 0 %
Lymphocytes Relative: 20 %
Lymphs Abs: 1.7 10*3/uL (ref 0.7–4.0)
MCH: 24.5 pg — ABNORMAL LOW (ref 26.0–34.0)
MCHC: 32.3 g/dL (ref 30.0–36.0)
MCV: 75.9 fL — ABNORMAL LOW (ref 80.0–100.0)
Monocytes Absolute: 0.6 10*3/uL (ref 0.1–1.0)
Monocytes Relative: 7 %
Neutro Abs: 6 10*3/uL (ref 1.7–7.7)
Neutrophils Relative %: 71 %
Platelets: 404 10*3/uL — ABNORMAL HIGH (ref 150–400)
RBC: 5.72 MIL/uL (ref 4.22–5.81)
RDW: 14.6 % (ref 11.5–15.5)
WBC: 8.4 10*3/uL (ref 4.0–10.5)
nRBC: 0 % (ref 0.0–0.2)

## 2022-03-28 LAB — COMPREHENSIVE METABOLIC PANEL
ALT: 16 U/L (ref 0–44)
AST: 14 U/L — ABNORMAL LOW (ref 15–41)
Albumin: 4.3 g/dL (ref 3.5–5.0)
Alkaline Phosphatase: 90 U/L (ref 38–126)
Anion gap: 8 (ref 5–15)
BUN: 12 mg/dL (ref 6–20)
CO2: 26 mmol/L (ref 22–32)
Calcium: 9.6 mg/dL (ref 8.9–10.3)
Chloride: 102 mmol/L (ref 98–111)
Creatinine, Ser: 0.95 mg/dL (ref 0.61–1.24)
GFR, Estimated: >60 mL/min (ref >60)
Glucose, Bld: 114 mg/dL — ABNORMAL HIGH (ref 70–99)
Potassium: 3.9 mmol/L (ref 3.5–5.1)
Sodium: 136 mmol/L (ref 135–145)
Total Bilirubin: 0.3 mg/dL (ref 0.3–1.2)
Total Protein: 8.1 g/dL (ref 6.5–8.1)

## 2022-03-28 LAB — RAD ONC ARIA SESSION SUMMARY
Course Elapsed Days: 1
Plan Fractions Treated to Date: 2
Plan Prescribed Dose Per Fraction: 2 Gy
Plan Total Fractions Prescribed: 35
Plan Total Prescribed Dose: 70 Gy
Reference Point Dosage Given to Date: 4 Gy
Reference Point Session Dosage Given: 2 Gy
Session Number: 2

## 2022-03-28 MED ORDER — SODIUM CHLORIDE 0.9 % IV SOLN
10.0000 mg | Freq: Once | INTRAVENOUS | Status: AC
  Administered 2022-03-28: 10 mg via INTRAVENOUS
  Filled 2022-03-28: qty 10

## 2022-03-28 MED ORDER — SODIUM CHLORIDE 0.9 % IV SOLN
Freq: Once | INTRAVENOUS | Status: AC
  Filled 2022-03-28: qty 250

## 2022-03-28 MED ORDER — PALONOSETRON HCL INJECTION 0.25 MG/5ML
0.2500 mg | Freq: Once | INTRAVENOUS | Status: AC
  Administered 2022-03-28: 0.25 mg via INTRAVENOUS
  Filled 2022-03-28: qty 5

## 2022-03-28 MED ORDER — SODIUM CHLORIDE 0.9 % IV SOLN
230.0000 mg | Freq: Once | INTRAVENOUS | Status: AC
  Administered 2022-03-28: 230 mg via INTRAVENOUS
  Filled 2022-03-28: qty 23

## 2022-03-28 NOTE — Assessment & Plan Note (Signed)
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 Recommend concurrent chemotherapy with radiation. Labs are reviewed and discussed with patient. Proceed with carboplatin

## 2022-03-28 NOTE — Progress Notes (Signed)
Multidisciplinary Oncology Council Documentation  Ezrah Panning was presented by our [facility] on 03/28/2022, which included representatives from:  Palliative Care Dietitian  Physical/Occupational Therapist Nurse Navigator Genetics Speech Therapist Social work Survivorship RN Financial Navigator Research RN   Earnest currently presents with history of H&N cancer  We reviewed previous medical and familial history, history of present illness, and recent lab results along with all available histopathologic and imaging studies. The [place] considered available treatment options and made the following recommendations/referrals:  SW, palliative care, nutrition, SLP  The MOC is a meeting of clinicians from various specialty areas who evaluate and discuss patients for whom a multidisciplinary approach is being considered. Final determinations in the plan of care are those of the provider(s).   Today's extended care, comprehensive team conference, Taym was not present for the discussion and was not examined.

## 2022-03-28 NOTE — Assessment & Plan Note (Signed)
Imaging indicates degenerative disease. Currently on Oxycodone as needed Refer to palliative care for evaluation.

## 2022-03-28 NOTE — Telephone Encounter (Signed)
LVM to inform patient he does not need echocardiogram. This test was performed last month in the hospital.

## 2022-03-28 NOTE — Assessment & Plan Note (Signed)
Encourage oral hydration  Expect symptoms to improve with chemotherapy and radiation.

## 2022-03-28 NOTE — Progress Notes (Signed)
Patient here for follow up. Reports having poor appetite.

## 2022-03-28 NOTE — Patient Instructions (Signed)
[facility]  Discharge Instructions: Thank you for choosing [facility] to provide your oncology and hematology care.  If you have a lab appointment with the Cancer Center, please go directly to the Cancer Center and check in at the registration area.  Wear comfortable clothing and clothing appropriate for easy access to any Portacath or PICC line.   We strive to give you quality time with your provider. You may need to reschedule your appointment if you arrive late (15 or more minutes).  Arriving late affects you and other patients whose appointments are after yours.  Also, if you miss three or more appointments without notifying the office, you may be dismissed from the clinic at the provider's discretion.      For prescription refill requests, have your pharmacy contact our office and allow 72 hours for refills to be completed.    Today you received the following chemotherapy and/or immunotherapy agents: Carboplatin      To help prevent nausea and vomiting after your treatment, we encourage you to take your nausea medication as directed.  BELOW ARE SYMPTOMS THAT SHOULD BE REPORTED IMMEDIATELY: *FEVER GREATER THAN 100.4 F (38 C) OR HIGHER *CHILLS OR SWEATING *NAUSEA AND VOMITING THAT IS NOT CONTROLLED WITH YOUR NAUSEA MEDICATION *UNUSUAL SHORTNESS OF BREATH *UNUSUAL BRUISING OR BLEEDING *URINARY PROBLEMS (pain or burning when urinating, or frequent urination) *BOWEL PROBLEMS (unusual diarrhea, constipation, pain near the anus) TENDERNESS IN MOUTH AND THROAT WITH OR WITHOUT PRESENCE OF ULCERS (sore throat, sores in mouth, or a toothache) UNUSUAL RASH, SWELLING OR PAIN  UNUSUAL VAGINAL DISCHARGE OR ITCHING   Items with * indicate a potential emergency and should be followed up as soon as possible or go to the Emergency Department if any problems should occur.  Please show the CHEMOTHERAPY ALERT CARD or IMMUNOTHERAPY ALERT CARD at check-in  to the Emergency Department and triage nurse.  Should you have questions after your visit or need to cancel or reschedule your appointment, please contact [facility]  [phone] and follow the prompts.  Office hours are 8:00 a.m. to 4:30 p.m. Monday - Friday. Please note that voicemails left after 4:00 p.m. may not be returned until the following business day.  We are closed weekends and major holidays. You have access to a nurse at all times for urgent questions. Please call the main number to the clinic [phone] and follow the prompts.  For any non-urgent questions, you may also contact your provider using MyChart. We now offer e-Visits for anyone 18 and older to request care online for non-urgent symptoms. For details visit [email or web address].   Also download the MyChart app! Go to the app store, search "MyChart", open the app, select [facility], and log in with your MyChart username and password.  Masks are optional in the cancer centers. If you would like for your care team to wear a mask while they are taking care of you, please let them know. For doctor visits, patients may have with them one support person who is at least 59 years old. At this time, visitors are not allowed in the infusion area.   

## 2022-03-28 NOTE — Progress Notes (Signed)
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 Recommend concurrent chemotherapy with radiation. Labs are reviewed and discussed with patient. Proceed with carboplatin  Neck pain Imaging indicates degenerative disease. Currently on Oxycodone as needed Refer to palliative care for evaluation.  Carotid sinus syndrome Encourage oral hydration  Expect symptoms to improve with chemotherapy and radiation.  Encounter for antineoplastic chemotherapy Treatment plan as listed above   No orders of the defined types were placed in this encounter.  Follow up 1 week lab MD carboplatin  All questions were answered. The patient knows to call the clinic with any problems, questions or concerns. No barriers to learning was detected.  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 male presents to establish care for  I have reviewed his chart and materials related to his cancer extensively and collaborated history with the patient. 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 This likely reflects an enlarged lymph node.  2 Abnormal soft tissue surrounding the mid-to-distal cervical right ICA with resultant mild vessel narrowing 3 Atherosclerotic plaque within the bilateral common carotid and cervical internal carotid arteries without hemodynamically significant stenosis. 4 Vertebral arteries patent within the neck. Mild-to-moderate atherosclerotic narrowing at the origin of the right vertebral artery. No more than mild atherosclerotic stenosis at the origin of the left vertebral artery. 5 Aortic Atherosclerosis and Emphysema 6. Cervical spondylosis   02/27/2022 Imaging   MRI neck soft tissue w wo Constellation of findings most compatible with a roughly 2.4 cm Right Tonsillar Carcinoma (series 12, image 11) with associated right level 2 and level 3 malignant nodal, ex-nodal disease (series 14, image 13). Recommend ENT consultation    02/27/2022 Imaging   02/27/2022 MRI brain without contrast showed  No acute intracranial abnormality. Abnormal right neck soft tissues. See dedicated Neck MRI today reported separately. 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 These are slow flow vascular malformations which are generally asymptomatic. [name] Other moderately advanced but nonspecific cerebral white matter signal changes.   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 Metastatic squamous cell carcinoma. Moderately differentiated with focal keratinization.  p16 negative.    02/28/2022 Cancer Staging   Staging form: Pharynx - P16 Negative Oropharynx, AJCC 8th Edition - Clinical stage from 02/28/2022: Stage IVA (cT2, cN2b, cM0, p16-) - Signed by [name], MD on 03/22/2022 Stage prefix: Initial diagnosis   03/19/2022 Imaging   PET scan showed Signs of RIGHT  neck  malignancy with metastatic involvement just below the skull base and at level II/III also on the RIGHT. No signs of contralateral nodal involvement.   03/28/2022 -  Chemotherapy   Patient is on Treatment Plan : HEAD/NECK Carboplatin (AUC 2) q7d      Patient initially presented emergency room due to intermittent syncope/near syncope episodes, diaphoretic, nauseated. Imaging findings are concerning for right tonsil mass with level 2/level 3 lymph node involvement which contributes to syncope/near syncope episodes -carotid sinus syndrome. 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  INTERVAL HISTORY Clarence Skinner is a 61 y.o. male who has above history reviewed by me today presents for follow up visit for management of stage IVa right tonsil squamous cell carcinoma Patient reports feeling well today.  No additional syncope events.  Accompanied by wife. Patient has no new complaints.  Appetite is not good.  Weight has been stable. No nausea vomiting today.  Yesterday after taking 2 oxycodone tablets, and amoxicillin, stomach was upset.  Today symptom has resolved.  MEDICAL HISTORY:  Past Medical History:  Diagnosis Date   COPD (chronic obstructive pulmonary disease) (HCC)    GERD (gastroesophageal reflux disease)    Gout    Hypertension    Multilevel degenerative disc disease    Pneumonia 11/29/2017   Sleep apnea    CPAP   Tachycardia    Wears dentures    partial upper and lower    SURGICAL HISTORY: Past Surgical History:  Procedure Laterality Date   ABCESS DRAINAGE  2009   tonsil   COLONOSCOPY WITH PROPOFOL N/A 02/11/2018   Procedure: COLONOSCOPY WITH PROPOFOL;  Surgeon: Toledo, Benay Pike, MD;  Location: [facility];  Service: Endoscopy;  Laterality: N/A;   ESOPHAGOGASTRODUODENOSCOPY (EGD) WITH PROPOFOL N/A 02/11/2018   Procedure: ESOPHAGOGASTRODUODENOSCOPY (EGD) WITH PROPOFOL;  Surgeon: Toledo, Benay Pike, MD;  Location: [facility];  Service: Endoscopy;   Laterality: N/A;   MASS EXCISION Right 08/14/2017   Procedure: EXCISION RIGHT TEMPORAL MASS SUBMENTAL MASS;  Surgeon: Carloyn Manner, MD;  Location: [facility];  Service: ENT;  Laterality: Right;  sleep apnea   RADIOACTIVE SEED IMPLANT N/A 01/26/2019   Procedure: RADIOACTIVE SEED IMPLANT/BRACHYTHERAPY IMPLANT;  Surgeon: Billey Co, MD;  Location: [facility];  Service: Urology;  Laterality: N/A;   SEPTOPLASTY  2016   [place], [place]   VOLUME STUDY N/A 12/29/2018   Procedure: VOLUME STUDY;  Surgeon: Billey Co, MD;  Location: [facility];  Service: Urology;  Laterality: N/A;    SOCIAL HISTORY: Social History   Socioeconomic History   Marital status: Significant Other    Spouse name: Not on file   Number of children: Not on file   Years of education: Not on file   Highest education level: Not on file  Occupational History   Not on file  Tobacco Use   Smoking status: Every Day    Packs/day: 1.00    Years: 30.00    Total pack years: 30.00    Types: Cigarettes   Smokeless tobacco: Former    Types: Snuff, Chew    Quit date: 12/05/1984   Tobacco comments:    since age 39  Vaping Use   Vaping Use: Never used  Substance and Sexual Activity   Alcohol use: Yes    Alcohol/week: 12.0 standard drinks of alcohol    Types: 12 Cans of beer per week    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   Drug use: Not Currently   Sexual  activity: Yes  Other Topics Concern   Not on file  Social History Narrative   Not on file   Social Determinants of Health   Financial Resource Strain: Not on file  Food Insecurity: Not on file  Transportation Needs: Not on file  Physical Activity: Not on file  Stress: Not on file  Social Connections: Not on file  Intimate Partner Violence: Not on file    FAMILY HISTORY: Family History  Problem Relation Age of Onset   Anxiety disorder Mother    Cancer Father    Hypertension Sister    Diabetes Sister    Asthma Sister     Other Brother        mva  at age 63   Gout Brother    Hypertension Brother    Prostate cancer Maternal Uncle    Prostate cancer Maternal Uncle     ALLERGIES:  is allergic to chocolate, eggs or egg-derived products, milk (cow), benadryl [diphenhydramine], and milk-related compounds.  MEDICATIONS:  Current Outpatient Medications  Medication Sig Dispense Refill   albuterol (PROVENTIL HFA;VENTOLIN HFA) 108 (90 Base) MCG/ACT inhaler Inhale into the lungs every 6 (six) hours as needed for wheezing or shortness of breath.     allopurinol (ZYLOPRIM) 300 MG tablet Take 300 mg by mouth daily.     amLODipine-benazepril (LOTREL) 10-40 MG capsule Take 1 capsule by mouth daily.     amoxicillin-clavulanate (AUGMENTIN) 875-125 MG tablet Take 1 tablet by mouth every 8 (eight) hours for 10 days. 30 tablet 0   aspirin EC 81 MG tablet Take 1 tablet (81 mg total) by mouth daily. Swallow whole. 90 tablet 3   Azelastine HCl 137 MCG/SPRAY SOLN Place 1 spray into the nose daily as needed (allergies).      baclofen (LIORESAL) 10 MG tablet Take 10 mg by mouth 2 (two) times daily as needed.     benzonatate (TESSALON) 100 MG capsule Take 100 mg by mouth every 8 (eight) hours as needed.     ferrous sulfate 325 (65 FE) MG tablet Take 325 mg by mouth daily.  3   fluticasone (FLONASE) 50 MCG/ACT nasal spray Place into both nostrils daily as needed for allergies or rhinitis.     Fluticasone-Umeclidin-Vilant (TRELEGY ELLIPTA) 100-62.5-25 MCG/ACT AEPB Trelegy Ellipta 100-62.5-25 MCG/INH Inhalation Aerosol Powder Breath Activated QTY: 90 each Days: 90 Refills: 1  Written: 11/02/20 Patient Instructions: One puff once a day     furosemide (LASIX) 20 MG tablet Take 20 mg by mouth daily.     gabapentin (NEURONTIN) 100 MG capsule Take 1 capsule (100 mg total) by mouth at bedtime. 30 capsule 1   loratadine (CLARITIN) 10 MG tablet Take 10 mg by mouth daily.     meloxicam (MOBIC) 7.5 MG tablet Take 7.5 mg by mouth daily as needed for  pain.     Oxycodone HCl 10 MG TABS Take 10 mg by mouth every 6 (six) hours as needed.     pantoprazole (PROTONIX) 40 MG tablet Take 40 mg by mouth daily.     rosuvastatin (CRESTOR) 20 MG tablet Take 1 tablet (20 mg total) by mouth daily. 30 tablet 3   senna (SENOKOT) 8.6 MG TABS tablet Take 1 tablet (8.6 mg total) by mouth daily. 120 tablet 0   sucralfate (CARAFATE) 1 g tablet Take 1 tablet (1 g total) by mouth 4 (four) times daily -  with meals and at bedtime. 30 tablet 2   SYMBICORT 80-4.5 MCG/ACT inhaler Inhale 1 puff into  the lungs 2 (two) times daily.     tadalafil (CIALIS) 20 MG tablet Take 1 tablet (20 mg total) by mouth daily. 30 tablet 11   Vitamin D, Ergocalciferol, (DRISDOL) 1.25 MG (50000 UNIT) CAPS capsule Take 50,000 Units by mouth once a week.     prochlorperazine (COMPAZINE) 10 MG tablet Take 1 tablet (10 mg total) by mouth every 6 (six) hours as needed for nausea or vomiting. (Patient not taking: Reported on 03/27/2022) 30 tablet 1   No current facility-administered medications for this visit.   Facility-Administered Medications Ordered in Other Visits  Medication Dose Route Frequency Provider Last Rate Last Admin   CARBOplatin (PARAPLATIN) 230 mg in sodium chloride 0.9 % 100 mL chemo infusion  230 mg Intravenous Once [name], MD 246 mL/hr at 03/28/22 1232 230 mg at 03/28/22 1232    Review of Systems  Constitutional:  Positive for fatigue. Negative for chills and fever.  HENT:   Positive for hearing loss. Negative for voice change.   Eyes:  Negative for eye problems and icterus.  Respiratory:  Negative for chest tightness, cough and shortness of breath.   Cardiovascular:  Negative for chest pain and leg swelling.  Gastrointestinal:  Negative for abdominal distention and abdominal pain.  Endocrine: Negative for hot flashes.  Genitourinary:  Negative for difficulty urinating, dysuria and frequency.   Musculoskeletal:  Positive for arthralgias and neck pain.  Skin:  Negative  for itching and rash.  Neurological:  Negative for light-headedness and numbness.  Hematological:  Negative for adenopathy. Does not bruise/bleed easily.  Psychiatric/Behavioral:  Negative for confusion.      PHYSICAL EXAMINATION: ECOG PERFORMANCE STATUS: 1 - Symptomatic but completely ambulatory  Vitals:   03/28/22 1045  BP: 128/69  Pulse: 86  Resp: 18  Temp: (!) 96.1 F (35.6 C)   Filed Weights   03/28/22 1045  Weight: 168 lb 3.2 oz (76.3 kg)    Physical Exam Constitutional:      General: He is not in acute distress.    Appearance: He is not diaphoretic.  HENT:     Head: Normocephalic and atraumatic.     Nose: Nose normal.     Mouth/Throat:     Pharynx: No oropharyngeal exudate.  Eyes:     General: No scleral icterus.    Pupils: Pupils are equal, round, and reactive to light.  Cardiovascular:     Rate and Rhythm: Normal rate and regular rhythm.     Heart sounds: No murmur heard. Pulmonary:     Effort: Pulmonary effort is normal. No respiratory distress.     Breath sounds: No rales.  Chest:     Chest wall: No tenderness.  Abdominal:     General: There is no distension.     Palpations: Abdomen is soft.     Tenderness: There is no abdominal tenderness.  Musculoskeletal:        General: Normal range of motion.     Cervical back: Normal range of motion and neck supple.  Skin:    General: Skin is warm and dry.     Findings: No erythema.  Neurological:     Mental Status: He is alert and oriented to person, place, and time.     Cranial Nerves: No cranial nerve deficit.     Motor: No abnormal muscle tone.     Coordination: Coordination normal.  Psychiatric:        Mood and Affect: Mood and affect normal.      LABORATORY  DATA:  I have reviewed the data as listed    Latest Ref Rng & Units 03/28/2022   10:00 AM 03/01/2022    5:15 AM 02/27/2022    4:46 AM  CBC  WBC 4.0 - 10.5 K/uL 8.4  6.6  6.4   Hemoglobin 13.0 - 17.0 g/dL 14.0  11.0  12.1   Hematocrit 39.0 -  52.0 % 43.4  34.7  37.9   Platelets 150 - 400 K/uL 404  264  263       Latest Ref Rng & Units 03/28/2022   10:00 AM 03/01/2022    5:15 AM 02/27/2022    4:46 AM  CMP  Glucose 70 - 99 mg/dL 114  93  111   BUN 6 - 20 mg/dL '12  10  11   '$ Creatinine 0.61 - 1.24 mg/dL 0.95  0.96  0.87   Sodium 135 - 145 mmol/L 136  140  137   Potassium 3.5 - 5.1 mmol/L 3.9  3.5  4.0   Chloride 98 - 111 mmol/L 102  113  112   CO2 22 - 32 mmol/L '26  24  19   '$ Calcium 8.9 - 10.3 mg/dL 9.6  8.1  7.6   Total Protein 6.5 - 8.1 g/dL 8.1   6.1   Total Bilirubin 0.3 - 1.2 mg/dL 0.3   0.5   Alkaline Phos 38 - 126 U/L 90   56   AST 15 - 41 U/L 14   11   ALT 0 - 44 U/L 16   10       RADIOGRAPHIC STUDIES: I have personally reviewed the radiological images as listed and agreed with the findings in the report. NM PET Image Initial (PI) Skull Base To Thigh (F-18 FDG)  Result Date: 03/19/2022 CLINICAL DATA:  Initial treatment strategy for head neck cancer. EXAM: NUCLEAR MEDICINE PET SKULL BASE TO THIGH TECHNIQUE: 8.62 mCi F-18 FDG was injected intravenously. Full-ring PET imaging was performed from the skull base to thigh after the radiotracer. CT data was obtained and used for attenuation correction and anatomic localization. Fasting blood glucose: 112 mg/dl COMPARISON:  Neck MRI from February 27, 2022 and CT angiography of the head and neck which was also performed in August of 2023. FINDINGS: Mediastinal blood pool activity: SUV max 2.18 Liver activity: SUV max NA NECK: Known tonsillar mass with loss of normal soft tissue planes about the RIGHT oropharynx and parapharyngeal space (image 24/4) measuring approximately 2.1 cm with a maximum SUV of 18.98. At the RIGHT skull base is another area of increased metabolic activity corresponding nodal disease that is better illustrated on the previous neck MRI (image 16/4) just below the jugular foramen and associated with the RIGHT styloid process this measures approximately 16 mm with a  maximum SUV of 17.55 (Image 30/4) large RIGHT neck mass with indistinct margins at level II/III measuring 3.5 x 2.6 cm with a maximum SUV of 17.99 No contralateral nodal disease. Incidental CT findings: Small cutaneous lesion suggested on image 26/4) 10 mm without signs of increased metabolic activity. CHEST: No hypermetabolic mediastinal or hilar nodes. No suspicious pulmonary nodules on the CT scan. Incidental CT findings: Aortic atherosclerosis with calcification. Normal heart size. No pericardial effusion. No adenopathy by size criteria in the chest. Signs of moderate pulmonary emphysema at the lung apices. No effusion, consolidation or airway abnormality. ABDOMEN/PELVIS: No abnormal hypermetabolic activity within the liver, pancreas, adrenal glands, or spleen. No hypermetabolic lymph nodes in the abdomen or pelvis. Incidental  CT findings: Liver with smooth contours. No acute process related to the gallbladder, pancreas, spleen, adrenal glands, kidneys, urinary bladder, stomach, small or large bowel. The appendix is normal. Aortic atherosclerosis. No aneurysmal dilation. No adenopathy in the abdomen or in the pelvis by size criteria. 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 SKELETON: No focal hypermetabolic activity to suggest skeletal metastasis. Facet arthropathy on the RIGHT at C6-C7 which is asymmetric to contralateral C6-C7 displays FDG uptake which is asymmetric but favored to be related to degenerative process. Maximum SUV 4.6. Incidental CT findings: None. IMPRESSION: Signs of RIGHT neck malignancy with metastatic involvement just below the skull base and at level II/III also on the RIGHT. No signs of contralateral nodal involvement. No signs of distant metastatic disease. Electronically Signed   By: Zetta Bills M.D.   On: 03/19/2022 15:45   [place] CORE BIOPSY (LYMPH NODES)  Result Date: 02/28/2022 INDICATION: Tonsillar mass and right neck lymphadenopathy EXAM: Ultrasound-guided biopsy of right neck lymph  node MEDICATIONS: None. COMPLICATIONS: None immediate. PROCEDURE: Informed written consent was obtained from the patient after a thorough discussion of the procedural risks, benefits and alternatives. All questions were addressed. Maximal Sterile Barrier Technique was utilized including caps, mask, sterile gowns, sterile gloves, sterile drape, hand hygiene and skin antiseptic. A timeout was performed prior to the initiation of the procedure. Patient position supine on the ultrasound table. Right neck skin prepped and draped in usual sterile fashion. Following local lidocaine administration, four 18 gauge cores were obtained from the enlarged right neck lymph node utilizing continuous ultrasound guidance. Samples were sent to pathology in sterile saline. Needle removed and hemostasis achieved with 1 minutes of manual compression. Post procedure ultrasound images showed no evidence of significant hemorrhage. IMPRESSION: Ultrasound-guided biopsy of enlarged right neck lymph node. Electronically Signed   By: Miachel Roux M.D.   On: 02/28/2022 15:43   ECHOCARDIOGRAM COMPLETE BUBBLE STUDY  Result Date: 02/28/2022    ECHOCARDIOGRAM REPORT   Patient Name:   [facility] Date of Exam: 02/28/2022 Medical Rec #:  [id number]      Height:       69.0 in Accession #:    [id number]     Weight:       185.6 lb Date of Birth:  05-18-1961      BSA:          2.002 m Patient Age:    55 years       BP:           102/52 mmHg Patient Gender: M              HR:           62 bpm. Exam Location:  [facility] Procedure: 2D Echo, Cardiac Doppler, Color Doppler and Saline Contrast Bubble            Study Indications:     Syncope 780.2 / R55  History:         Patient has no prior history of Echocardiogram examinations.                  COPD; Risk Factors:Hypertension.  Sonographer:     Sherrie Sport Referring Phys:  [place] Diagnosing Phys: Serafina Royals MD  Sonographer Comments: Suboptimal apical window. IMPRESSIONS  1. Left ventricular  ejection fraction, by estimation, is 60 to 65%. The left ventricle has normal function. The left ventricle has no regional wall motion abnormalities. Left ventricular diastolic parameters were normal.  2. Right ventricular  systolic function is normal. The right ventricular size is normal.  3. The mitral valve is normal in structure. Mild to moderate mitral valve regurgitation.  4. Tricuspid valve regurgitation is moderate.  5. The aortic valve is normal in structure. Aortic valve regurgitation is not visualized. FINDINGS  Left Ventricle: Left ventricular ejection fraction, by estimation, is 60 to 65%. The left ventricle has normal function. The left ventricle has no regional wall motion abnormalities. The left ventricular internal cavity size was normal in size. There is  no left ventricular hypertrophy. Left ventricular diastolic parameters were normal. Right Ventricle: The right ventricular size is normal. No increase in right ventricular wall thickness. Right ventricular systolic function is normal. Left Atrium: Left atrial size was normal in size. Right Atrium: Right atrial size was normal in size. Pericardium: There is no evidence of pericardial effusion. Mitral Valve: The mitral valve is normal in structure. Mild to moderate mitral valve regurgitation. Tricuspid Valve: The tricuspid valve is normal in structure. Tricuspid valve regurgitation is moderate. Aortic Valve: The aortic valve is normal in structure. Aortic valve regurgitation is not visualized. Aortic valve mean gradient measures 2.0 mmHg. Aortic valve peak gradient measures 3.3 mmHg. Aortic valve area, by VTI measures 2.36 cm. Pulmonic Valve: The pulmonic valve was normal in structure. Pulmonic valve regurgitation is not visualized. Aorta: The aortic root and ascending aorta are structurally normal, with no evidence of dilitation. IAS/Shunts: No atrial level shunt detected by color flow Doppler. Agitated saline contrast was given intravenously to  evaluate for intracardiac shunting.  LEFT VENTRICLE PLAX 2D LVIDd:         4.60 cm   Diastology LVIDs:         3.20 cm   LV e' medial:    10.40 cm/s LV PW:         1.10 cm   LV E/e' medial:  4.2 LV IVS:        0.90 cm   LV e' lateral:   11.40 cm/s LVOT diam:     2.00 cm   LV E/e' lateral: 3.9 LV SV:         46 LV SV Index:   23 LVOT Area:     3.14 cm  RIGHT VENTRICLE RV Basal diam:  3.90 cm RV S prime:     12.90 cm/s TAPSE (M-mode): 2.0 cm LEFT ATRIUM             Index        RIGHT ATRIUM           Index LA diam:        3.00 cm 1.50 cm/m   RA Area:     18.60 cm LA Vol (A2C):   45.3 ml 22.63 ml/m  RA Volume:   55.60 ml  27.78 ml/m LA Vol (A4C):   38.4 ml 19.19 ml/m LA Biplane Vol: 44.3 ml 22.13 ml/m  AORTIC VALVE AV Area (Vmax):    2.02 cm AV Area (Vmean):   2.20 cm AV Area (VTI):     2.36 cm AV Vmax:           91.10 cm/s AV Vmean:          58.750 cm/s AV VTI:            0.194 m AV Peak Grad:      3.3 mmHg AV Mean Grad:      2.0 mmHg LVOT Vmax:         58.70 cm/s LVOT Vmean:  41.100 cm/s LVOT VTI:          0.146 m LVOT/AV VTI ratio: 0.75  AORTA Ao Root diam: 2.90 cm MITRAL VALVE               TRICUSPID VALVE MV Area (PHT): 2.82 cm    TR Peak grad:   27.2 mmHg MV Decel Time: 269 msec    TR Vmax:        261.00 cm/s MV E velocity: 44.10 cm/s MV A velocity: 97.30 cm/s  SHUNTS MV E/A ratio:  0.45        Systemic VTI:  0.15 m                            Systemic Diam: 2.00 cm Serafina Royals MD Electronically signed by Serafina Royals MD Signature Date/Time: 02/28/2022/12:51:18 PM    Final    US Carotid Bilateral  Result Date: 02/27/2022 CLINICAL DATA:  61 year old male with a history of syncope EXAM: BILATERAL CAROTID DUPLEX ULTRASOUND TECHNIQUE: Pearline Cables scale imaging, color Doppler and duplex ultrasound were performed of bilateral carotid and vertebral arteries in the neck. COMPARISON:  Neuro imaging of the same day and 1 day prior FINDINGS: Criteria: Quantification of carotid stenosis is based on velocity  parameters that correlate the residual internal carotid diameter with NASCET-based stenosis levels, using the diameter of the distal internal carotid lumen as the denominator for stenosis measurement. The following velocity measurements were obtained: RIGHT ICA:  Systolic 161 cm/sec, Diastolic not recorded CCA:  57 cm/sec SYSTOLIC ICA/CCA RATIO:  2.0 ECA:  149 cm/sec LEFT ICA:  Systolic 62 cm/sec, Diastolic not recorded CCA:  48 cm/sec SYSTOLIC ICA/CCA RATIO:  1.3 ECA:  118 cm/sec Right Brachial SBP: Not acquired Left Brachial SBP: Not acquired RIGHT CAROTID ARTERY: No significant calcifications of the right common carotid artery. Intermediate waveform maintained. Heterogeneous and partially calcified plaque at the right carotid bifurcation. No significant lumen shadowing. Low resistance waveform of the right ICA. No significant tortuosity. RIGHT VERTEBRAL ARTERY: Antegrade flow with low resistance waveform. LEFT CAROTID ARTERY: No significant calcifications of the left common carotid artery. Intermediate waveform maintained. Heterogeneous and partially calcified plaque at the left carotid bifurcation without significant lumen shadowing. Low resistance waveform of the left ICA. No significant tortuosity. LEFT VERTEBRAL ARTERY:  Antegrade flow with low resistance waveform. Additional: Incidental imaging of pathologic lymphadenopathy of the right neck, better characterized on recent cross-sectional neuro imaging. IMPRESSION: Color duplex indicates minimal heterogeneous and calcified plaque, with no hemodynamically significant stenosis by duplex criteria in the extracranial cerebrovascular circulation. Pathologic lymphadenopathy of the neck better characterized on recent neuro imaging. Signed, Dulcy Fanny. Nadene Rubins, RPVI [facility] Radiology Electronically Signed   By: Corrie Mckusick D.O.   On: 02/27/2022 14:02   MR NECK SOFT TISSUE ONLY W WO CONTRAST  Result  Date: 02/27/2022 CLINICAL DATA:  61 year old male with dizziness. Neck pain. Right neck level 2/3 soft tissue mass on CTA neck yesterday thought to be abnormal lymph nodes. EXAM: MRI OF THE NECK WITH CONTRAST TECHNIQUE: Multiplanar, multisequence MR imaging was performed following the administration of intravenous contrast. CONTRAST:  2m GADAVIST GADOBUTROL 1 MMOL/ML IV SOLN COMPARISON:  CTA head and neck yesterday. Brain MRI today reported separately. FINDINGS: Pharynx and larynx: Larynx is within normal limits. Vallecula and hypopharynx within normal limits. Asymmetric although fairly subtle 15 mm area of masslike T2 hyperintensity and homogeneous enhancement along the posterior and  lateral wall of the oropharynx on the right (series 12, image 11 and series 18, image 11). This area has abnormal diffusion on coronal brain DWI today. This is just below the soft palate and uvula which appear to remain normal. The mass is up to 2.4 cm long axis on series 14, image 12. Nasopharynx contour is within normal limits. Midline retropharyngeal space and bilateral parapharyngeal spaces are within normal limits. Salivary glands: Negative except for mild mass effect on the right submandibular gland, see nodal findings below. Thyroid: Negative. Lymph nodes: Heterogeneous T2 hyperintense and enhancing soft tissue mass posterior to the right carotid space at the junction of the right level 2 B and 3B nodal station is 23 x 34 x 45 mm (AP by transverse by CC). This is inseparable from the posterior carotid space. And a 2nd smaller area of similar asymmetric T2 heterogeneous soft tissue and enhancement just below the skull base (series 12, image 6 and also series 13, image 14) is roughly 16 x 14 x 30 mm (AP by transverse by CC) and mildly narrow due the adjacent right ICA on the CTA yesterday. Burtis Junes this is malignant nodal versus ex nodal disease. No other cervical lymphadenopathy identified. Vascular: Maintained vascular flow voids  in the neck. See CTA findings yesterday. Limited intracranial: Brain MRI today reported separately. Visualized orbits: Not included. Mastoids and visualized paranasal sinuses: Well aerated. Skeleton: Cervical spine degeneration including disc disease and degenerative appearing anterolisthesis of C3 on C4 with facet degeneration. No osseous metastatic disease identified. No cervical spine dural thickening. Cervical spinal cord appears to be normal. Upper chest: Better demonstrated on CTA yesterday. 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 Recommend ENT consultation. Electronically Signed   By: Genevie Ann M.D.   On: 02/27/2022 08:32   MR BRAIN WO CONTRAST  Result Date: 02/27/2022 CLINICAL DATA:  61 year old male with syncope, dizziness. EXAM: MRI HEAD WITHOUT CONTRAST TECHNIQUE: Multiplanar, multiecho pulse sequences of the brain and surrounding structures were obtained without intravenous contrast. COMPARISON:  CTA head and neck yesterday. FINDINGS: Brain: No restricted diffusion to suggest acute infarction. No midline shift, mass effect, evidence of mass lesion, ventriculomegaly, extra-axial collection or acute intracranial hemorrhage. Cervicomedullary junction and pituitary are within normal limits. Patchy, widely scattered bilateral cerebral white matter T2 and FLAIR hyperintensity. This is in a nonspecific pattern. No cortical encephalomalacia identified. In the subcortical white matter of the left superior frontal gyrus posteriorly there is a small T2 heterogeneous 6-7 mm lesion (series 10, image 19) with pronounced hemosiderin on SWI (series 13, image 42). No regional edema or mass effect. No other chronic cerebral blood products. Deep gray nuclei, brainstem and cerebellum are within normal limits. Vascular: Major intracranial  vascular flow voids are preserved. Skull and upper cervical spine: Negative visible cervical spine. Visualized bone marrow signal is within normal limits. Sinuses/Orbits: Negative orbits. Paranasal sinuses and mastoids are stable and well aerated. Other: There is is 3 cm segment of abnormal diffusion in the right upper neck just below the skull base (series 7, image 22. And similar asymmetric diffusion along the right posterior pharynx series 7, image 23. See dedicated Neck MRI today reported separately. IMPRESSION: 1. No acute intracranial abnormality. Abnormal right neck soft tissues. See dedicated Neck MRI today reported separately. 2. 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 These are slow flow vascular malformations which are generally asymptomatic. 3. Other moderately advanced but nonspecific cerebral white matter signal changes. Electronically Signed   By: Genevie Ann M.D.   On: 02/27/2022 08:22   CT ANGIO HEAD NECK W WO CM  Result Date: 02/26/2022 CLINICAL DATA:  Provided history: Dizziness, persistent/recurrent, cardiac or vascular cause suspected; recurrent syncope, neck pain, severe. EXAM: CT ANGIOGRAPHY HEAD AND NECK TECHNIQUE: Multidetector CT imaging of the head and neck was performed using the standard protocol during bolus administration of intravenous contrast. Multiplanar CT image reconstructions and MIPs were obtained to evaluate the vascular anatomy. Carotid stenosis measurements (when applicable) are obtained utilizing NASCET criteria, using the distal internal carotid diameter as the denominator. RADIATION DOSE REDUCTION: This exam was performed according to the departmental dose-optimization program which includes automated exposure control, adjustment of the mA and/or kV according to patient size and/or use of iterative reconstruction technique. CONTRAST:  80m OMNIPAQUE IOHEXOL 350 MG/ML SOLN COMPARISON:  Head CT  06/18/2011. Report from brain MRI 05/12/1996 (images unavailable). FINDINGS: CT HEAD FINDINGS Brain: Mild generalized parenchymal atrophy. Mild patchy and ill-defined hypoattenuation within the cerebral white matter, nonspecific but most often secondary to chronic small vessel ischemia. There is no acute intracranial hemorrhage. No demarcated cortical infarct. No extra-axial fluid collection. No evidence of an intracranial mass. No midline shift. Vascular: No hyperdense vessel. Atherosclerotic calcifications. Skull: No fracture or aggressive osseous lesion. Sinuses/Orbits: No orbital mass or acute orbital finding. Mild mucosal thickening within the right frontal, bilateral ethmoid and bilateral maxillary sinuses. Review of the MIP images confirms the above findings CTA NECK FINDINGS Aortic arch: Standard aortic branching. Atherosclerotic plaque within the visualized aortic arch and proximal major branch vessels of the neck. Streak and beam hardening artifact arising from a dense left-sided contrast bolus partially obscures the left subclavian artery. Within this limitation, there is no appreciable hemodynamically significant innominate or proximal subclavian artery stenosis. Right carotid system: CCA and ICA patent within the neck. Atherosclerotic plaque within the proximal ICA, resulting in less than 50% stenosis. There is abnormal soft tissue surrounding the mid-to-distal cervical ICA with associated mild narrowing of the ICA at these levels. Left carotid system: CCA and ICA patent within the neck without stenosis. Mild atherosclerotic plaque at the CCA origin and about the carotid bifurcation. Vertebral arteries: Vertebral arteries patent within the neck. Mild to moderate atherosclerotic narrowing at the origin of the right vertebral artery. Calcified plaque at the origin of the left vertebral artery with no more than mild stenosis. Skeleton: Reversal of the expected cervical lordosis. Cervical spondylosis. No  acute fracture or aggressive osseous lesion. Other neck: 4.3 x 2.5 cm ovoid soft tissue focus at the right level II/level III stations (along the posterior aspect of the proximal ICA, carotid bifurcation and distal CCA). This likely reflects an enlarged lymph node (for instance as seen on series 10, image 223) (series 13, image 90). Subcentimeter thyroid nodules, not meeting consensus criteria for ultrasound follow-up based on size. Reference: J Am Coll Radiol. 2015 Feb;12(2): 143-50. Upper chest: Centrilobular and paraseptal emphysema. No consolidation within the imaged lung apices. Review of the MIP images confirms the above findings CTA HEAD FINDINGS Anterior circulation: The intracranial internal carotid arteries are patent. The M1 middle cerebral arteries are patent. No M2 proximal branch occlusion or high-grade proximal stenosis is identified. Atherosclerotic irregularity of the M2 and more distal MCA vessels, bilaterally. The anterior cerebral arteries are patent. No intracranial aneurysm is  identified. Posterior circulation: The intracranial vertebral arteries are patent. The basilar artery is patent. The posterior cerebral arteries are patent. A left posterior communicating artery is present. The right posterior communicating artery is diminutive or absent. Venous sinuses: Within the limitations of contrast timing, no convincing thrombus. Anatomic variants: As described. Review of the MIP images confirms the above findings IMPRESSION: CT head: 1. No evidence of acute intracranial abnormality. 2. Mild patchy and ill-defined hypoattenuation within the cerebral white matter, nonspecific but most often secondary to chronic small vessel ischemia. 3. Mild paranasal sinus mucosal thickening. CTA neck: 1. 4.3 x 2.5 cm ovoid soft tissue focus at the right level II/III stations (along the posterior aspect of the proximal ICA, carotid bifurcation and distal CCA). This likely reflects an enlarged lymph node. Given the  size, this is concerning for nodal metastatic disease, although a reactive/inflammatory lymph node is possible. 2. Abnormal soft tissue surrounding the mid-to-distal cervical right ICA with resultant mild vessel narrowing. Findings are nonspecific, but may reflect a nodal mass or sequela of a vasculopathy. A contrast-enhanced neck CT is recommended for initial further evaluation of this finding, and for initial further evaluation of the finding described in impression #1. 3. Atherosclerotic plaque within the bilateral common carotid and cervical internal carotid arteries without hemodynamically significant stenosis. 4. Vertebral arteries patent within the neck. Mild-to-moderate atherosclerotic narrowing at the origin of the right vertebral artery. No more than mild atherosclerotic stenosis at the origin of the left vertebral artery. 5. Aortic Atherosclerosis (ICD10-I70.0) and Emphysema (ICD10-J43.9). 6. Cervical spondylosis. CTA head: Intracranial atherosclerotic disease without intracranial large vessel occlusion or proximal high-grade arterial stenosis. Electronically Signed   By: Kellie Simmering D.O.   On: 02/26/2022 17:13   DG Chest Portable 1 View  Result Date: 02/26/2022 CLINICAL DATA:  Syncope EXAM: PORTABLE CHEST 1 VIEW COMPARISON:  February 13, 2022, April 07, 2018 FINDINGS: The heart size and mediastinal contours are within normal limits. Both lungs are clear. The visualized skeletal structures are unremarkable. IMPRESSION: No acute cardiopulmonary abnormality. Electronically Signed   By: Beryle Flock M.D.   On: 02/26/2022 15:37

## 2022-03-28 NOTE — Assessment & Plan Note (Signed)
Treatment plan as listed above. 

## 2022-03-29 ENCOUNTER — Ambulatory Visit: Payer: Medicaid Other | Admitting: Cardiology

## 2022-03-29 ENCOUNTER — Telehealth: Payer: Self-pay

## 2022-03-29 ENCOUNTER — Other Ambulatory Visit: Payer: Self-pay

## 2022-03-29 ENCOUNTER — Ambulatory Visit
Admission: RE | Admit: 2022-03-29 | Discharge: 2022-03-29 | Disposition: A | Discharge status: Home / Self Care | Payer: Medicaid Other | Source: Ambulatory Visit | Attending: Radiation Oncology | Admitting: Radiation Oncology

## 2022-03-29 DIAGNOSIS — C099 Malignant neoplasm of tonsil, unspecified: Secondary | ICD-10-CM | POA: Diagnosis not present

## 2022-03-29 DIAGNOSIS — F17210 Nicotine dependence, cigarettes, uncomplicated: Secondary | ICD-10-CM | POA: Diagnosis not present

## 2022-03-29 DIAGNOSIS — Z5111 Encounter for antineoplastic chemotherapy: Secondary | ICD-10-CM | POA: Diagnosis not present

## 2022-03-29 DIAGNOSIS — Z515 Encounter for palliative care: Secondary | ICD-10-CM | POA: Diagnosis not present

## 2022-03-29 DIAGNOSIS — Z20822 Contact with and (suspected) exposure to covid-19: Secondary | ICD-10-CM | POA: Diagnosis not present

## 2022-03-29 DIAGNOSIS — K1230 Oral mucositis (ulcerative), unspecified: Secondary | ICD-10-CM | POA: Diagnosis not present

## 2022-03-29 DIAGNOSIS — C770 Secondary and unspecified malignant neoplasm of lymph nodes of head, face and neck: Secondary | ICD-10-CM | POA: Diagnosis not present

## 2022-03-29 DIAGNOSIS — G9001 Carotid sinus syncope: Secondary | ICD-10-CM | POA: Diagnosis not present

## 2022-03-29 DIAGNOSIS — G893 Neoplasm related pain (acute) (chronic): Secondary | ICD-10-CM | POA: Diagnosis not present

## 2022-03-29 DIAGNOSIS — Z510 Encounter for antineoplastic radiation therapy: Secondary | ICD-10-CM | POA: Diagnosis not present

## 2022-03-29 LAB — RAD ONC ARIA SESSION SUMMARY
Course Elapsed Days: 2
Plan Fractions Treated to Date: 3
Plan Prescribed Dose Per Fraction: 2 Gy
Plan Total Fractions Prescribed: 35
Plan Total Prescribed Dose: 70 Gy
Reference Point Dosage Given to Date: 6 Gy
Reference Point Session Dosage Given: 2 Gy
Session Number: 3

## 2022-03-29 NOTE — Telephone Encounter (Signed)
Telephone call to patient for follow up after receiving first chemo yesterday.   Patient complains of nausea, tooth ache and throat hurting.   Encouraged patient to take anti-nausea meds and pains meds with food.   Caregiver states patient was put on antibiotic for tooth infection and thinks this is making him nauseated.   Encouraged patient and caregiver to call if not getting any better after meds.  Patients states he has appointment at cancer center at 9:30 tomorrow am.

## 2022-03-30 ENCOUNTER — Other Ambulatory Visit: Payer: Self-pay

## 2022-03-30 ENCOUNTER — Ambulatory Visit
Admission: RE | Admit: 2022-03-30 | Discharge: 2022-03-30 | Disposition: A | Discharge status: Home / Self Care | Payer: Medicaid Other | Source: Ambulatory Visit | Attending: Radiation Oncology | Admitting: Radiation Oncology

## 2022-03-30 DIAGNOSIS — Z510 Encounter for antineoplastic radiation therapy: Secondary | ICD-10-CM | POA: Diagnosis not present

## 2022-03-30 DIAGNOSIS — K1230 Oral mucositis (ulcerative), unspecified: Secondary | ICD-10-CM | POA: Diagnosis not present

## 2022-03-30 DIAGNOSIS — C099 Malignant neoplasm of tonsil, unspecified: Secondary | ICD-10-CM | POA: Diagnosis not present

## 2022-03-30 DIAGNOSIS — G893 Neoplasm related pain (acute) (chronic): Secondary | ICD-10-CM | POA: Diagnosis not present

## 2022-03-30 DIAGNOSIS — Z5111 Encounter for antineoplastic chemotherapy: Secondary | ICD-10-CM | POA: Diagnosis not present

## 2022-03-30 DIAGNOSIS — F17210 Nicotine dependence, cigarettes, uncomplicated: Secondary | ICD-10-CM | POA: Diagnosis not present

## 2022-03-30 DIAGNOSIS — Z20822 Contact with and (suspected) exposure to covid-19: Secondary | ICD-10-CM | POA: Diagnosis not present

## 2022-03-30 DIAGNOSIS — C770 Secondary and unspecified malignant neoplasm of lymph nodes of head, face and neck: Secondary | ICD-10-CM | POA: Diagnosis not present

## 2022-03-30 DIAGNOSIS — G9001 Carotid sinus syncope: Secondary | ICD-10-CM | POA: Diagnosis not present

## 2022-03-30 DIAGNOSIS — Z515 Encounter for palliative care: Secondary | ICD-10-CM | POA: Diagnosis not present

## 2022-03-30 LAB — RAD ONC ARIA SESSION SUMMARY
Course Elapsed Days: 3
Plan Fractions Treated to Date: 4
Plan Prescribed Dose Per Fraction: 2 Gy
Plan Total Fractions Prescribed: 35
Plan Total Prescribed Dose: 70 Gy
Reference Point Dosage Given to Date: 8 Gy
Reference Point Session Dosage Given: 2 Gy
Session Number: 4

## 2022-04-02 ENCOUNTER — Ambulatory Visit
Admission: RE | Admit: 2022-04-02 | Discharge: 2022-04-02 | Disposition: A | Discharge status: Home / Self Care | Payer: Medicaid Other | Source: Ambulatory Visit | Attending: Radiation Oncology | Admitting: Radiation Oncology

## 2022-04-02 ENCOUNTER — Other Ambulatory Visit: Payer: Self-pay

## 2022-04-02 DIAGNOSIS — Z510 Encounter for antineoplastic radiation therapy: Secondary | ICD-10-CM | POA: Diagnosis not present

## 2022-04-02 DIAGNOSIS — G893 Neoplasm related pain (acute) (chronic): Secondary | ICD-10-CM | POA: Diagnosis not present

## 2022-04-02 DIAGNOSIS — G9001 Carotid sinus syncope: Secondary | ICD-10-CM | POA: Diagnosis not present

## 2022-04-02 DIAGNOSIS — F17210 Nicotine dependence, cigarettes, uncomplicated: Secondary | ICD-10-CM | POA: Diagnosis not present

## 2022-04-02 DIAGNOSIS — Z20822 Contact with and (suspected) exposure to covid-19: Secondary | ICD-10-CM | POA: Diagnosis not present

## 2022-04-02 DIAGNOSIS — K1230 Oral mucositis (ulcerative), unspecified: Secondary | ICD-10-CM | POA: Diagnosis not present

## 2022-04-02 DIAGNOSIS — Z515 Encounter for palliative care: Secondary | ICD-10-CM | POA: Diagnosis not present

## 2022-04-02 DIAGNOSIS — C099 Malignant neoplasm of tonsil, unspecified: Secondary | ICD-10-CM | POA: Diagnosis not present

## 2022-04-02 DIAGNOSIS — Z5111 Encounter for antineoplastic chemotherapy: Secondary | ICD-10-CM | POA: Diagnosis not present

## 2022-04-02 DIAGNOSIS — C770 Secondary and unspecified malignant neoplasm of lymph nodes of head, face and neck: Secondary | ICD-10-CM | POA: Diagnosis not present

## 2022-04-02 LAB — RAD ONC ARIA SESSION SUMMARY
Course Elapsed Days: 6
Plan Fractions Treated to Date: 5
Plan Prescribed Dose Per Fraction: 2 Gy
Plan Total Fractions Prescribed: 35
Plan Total Prescribed Dose: 70 Gy
Reference Point Dosage Given to Date: 10 Gy
Reference Point Session Dosage Given: 2 Gy
Session Number: 5

## 2022-04-03 ENCOUNTER — Inpatient Hospital Stay: Payer: Medicaid Other | Admitting: Licensed Clinical Social Worker

## 2022-04-03 ENCOUNTER — Ambulatory Visit
Admission: RE | Admit: 2022-04-03 | Discharge: 2022-04-03 | Disposition: A | Discharge status: Home / Self Care | Payer: Medicaid Other | Source: Ambulatory Visit | Attending: Radiation Oncology | Admitting: Radiation Oncology

## 2022-04-03 ENCOUNTER — Other Ambulatory Visit: Payer: Self-pay

## 2022-04-03 DIAGNOSIS — K1230 Oral mucositis (ulcerative), unspecified: Secondary | ICD-10-CM | POA: Diagnosis not present

## 2022-04-03 DIAGNOSIS — C099 Malignant neoplasm of tonsil, unspecified: Secondary | ICD-10-CM | POA: Diagnosis not present

## 2022-04-03 DIAGNOSIS — Z515 Encounter for palliative care: Secondary | ICD-10-CM | POA: Diagnosis not present

## 2022-04-03 DIAGNOSIS — C770 Secondary and unspecified malignant neoplasm of lymph nodes of head, face and neck: Secondary | ICD-10-CM | POA: Diagnosis not present

## 2022-04-03 DIAGNOSIS — Z510 Encounter for antineoplastic radiation therapy: Secondary | ICD-10-CM | POA: Diagnosis not present

## 2022-04-03 DIAGNOSIS — F17210 Nicotine dependence, cigarettes, uncomplicated: Secondary | ICD-10-CM | POA: Diagnosis not present

## 2022-04-03 DIAGNOSIS — G9001 Carotid sinus syncope: Secondary | ICD-10-CM | POA: Diagnosis not present

## 2022-04-03 DIAGNOSIS — Z20822 Contact with and (suspected) exposure to covid-19: Secondary | ICD-10-CM | POA: Diagnosis not present

## 2022-04-03 DIAGNOSIS — G893 Neoplasm related pain (acute) (chronic): Secondary | ICD-10-CM | POA: Diagnosis not present

## 2022-04-03 DIAGNOSIS — Z5111 Encounter for antineoplastic chemotherapy: Secondary | ICD-10-CM | POA: Diagnosis not present

## 2022-04-03 LAB — RAD ONC ARIA SESSION SUMMARY
Course Elapsed Days: 7
Plan Fractions Treated to Date: 6
Plan Prescribed Dose Per Fraction: 2 Gy
Plan Total Fractions Prescribed: 35
Plan Total Prescribed Dose: 70 Gy
Reference Point Dosage Given to Date: 12 Gy
Reference Point Session Dosage Given: 2 Gy
Session Number: 6

## 2022-04-03 MED FILL — Dexamethasone Sodium Phosphate Inj 100 MG/10ML: INTRAMUSCULAR | Qty: 1 | Status: AC

## 2022-04-03 NOTE — Progress Notes (Addendum)
[place] Work  Initial Assessment   Denis Carreon is a 61 y.o. year old male contacted by phone. Clinical Social Work was referred by medical provider for assessment of psychosocial needs.   SDOH (Social Determinants of Health) assessments performed: Yes SDOH Interventions    Flowsheet Row Clinical Support from 04/03/2022 in [place] at [place] Interventions   Food Insecurity Interventions [place] Referral, Other (Comment)  [[name] card and [facility] food pantry [place] Interventions [place] Referral, Other (Comment)  [[name] currently lives with SO, and is concerned with financial expenses due to treatment]  Transportation Interventions [id number] Referral, Patient Resources (Friends/Family), CCAR Printmaker ([place]. Only)  Utilities Interventions [id number] Referral  Alcohol Usage Interventions Intervention Not Indicated (Score <7)  Financial Strain Interventions [id number] Referral, Intervention Not Indicated, Other (Comment)  [fund manager for financial assistance]  Physical Activity Interventions Intervention Not Indicated  Stress Interventions Provide Counseling  Social Connections Interventions Intervention Not Indicated       SDOH Screenings   Food Insecurity: Food Insecurity Present (04/03/2022)  Housing: Medium Risk (04/03/2022)  Transportation Needs: Unmet Transportation Needs (04/03/2022)  Utilities: Not At Risk (04/03/2022)  Alcohol Screen: Low Risk  (04/03/2022)  Depression (PHQ2-9): Low Risk  (04/03/2022)  Financial Resource Strain: High Risk (04/03/2022)  Physical Activity: Inactive (04/03/2022)  Social Connections: Moderately Isolated (04/03/2022)  Stress: Stress Concern Present (04/03/2022)  Tobacco Use: High Risk (03/28/2022)     Distress Screen completed: No    03/22/2022    9:55 AM  ONCBCN DISTRESS SCREENING  Screening Type Initial Screening  Distress experienced in past week (1-10) 0       Family/Social Information:  Housing Arrangement: patient lives with Bobbye Morton  [phone] Family members/support persons in your life? Friends and Geophysical data processor concerns: yes, patient stated he needs assistance with transportation to cancer center appointments.  Employment: Disabled  .  Income source: Banker concerns: Yes, current concerns Type of concern: Utilities, Rent/ mortgage, Medical bills, and Food Food access concerns: yes Religious or spiritual practice: Not known Services Currently in place:  Disability, Medicaid, EBT  Coping/ Adjustment to diagnosis: Patient understands treatment plan and what happens next? yes Concerns about diagnosis and/or treatment: Pain or discomfort during procedures, Feelings of anger or sadness, Afraid of cancer, How I will pay for the services I need, How will I care for myself, and Quality of life Patient reported stressors: Housing, Insurance underwriter, Publishing rights manager, Transport planner, Haematologist, Adjusting to my illness, Isolation/ feeling alone, Relating to God, Facing my mortality, and Physical issues Hopes and/or priorities: N/A Patient enjoys  N/A Current coping skills/ strengths: Motivation for treatment/growth  and Supportive family/friends     SUMMARY: Current SDOH Barriers:  Financial constraints related to fixed income, reduced monthly EBT amount, Limited social support, Transportation, ADL IADL limitations, Social Isolation, and Memory Deficits  Clinical Social Work Clinical Goal(s):  Patient will follow up with Medicaid to discuss EBT, and over the counter medication assistance* as directed by SW  Interventions: Discussed common feeling and emotions when being diagnosed with cancer, and the importance of support during treatment Informed patient of the support team roles and support services at [facility] Provided CSW contact information and encouraged patient to call with any questions or  concerns Referred patient to [employer], [facility] transportation and food assistance, [name] Card and [place] and Provided patient with information about CSW role inpatient care, and other available resources.   Follow  Up Plan: Patient will contact CSW with any support or resource needs and Patient will contact Medicare and transportation at [facility] Patient verbalizes understanding of plan: Yes  Adelene Amas, LCSW   Patient is participating in a Managed Medicaid Plan:  Yes

## 2022-04-04 ENCOUNTER — Inpatient Hospital Stay: Payer: Medicaid Other

## 2022-04-04 ENCOUNTER — Encounter: Payer: Self-pay | Admitting: Oncology

## 2022-04-04 ENCOUNTER — Telehealth: Payer: Self-pay

## 2022-04-04 ENCOUNTER — Other Ambulatory Visit: Payer: Self-pay

## 2022-04-04 ENCOUNTER — Inpatient Hospital Stay (HOSPITAL_BASED_OUTPATIENT_CLINIC_OR_DEPARTMENT_OTHER): Payer: Medicaid Other | Admitting: Oncology

## 2022-04-04 ENCOUNTER — Ambulatory Visit
Admission: RE | Admit: 2022-04-04 | Discharge: 2022-04-04 | Disposition: A | Discharge status: Home / Self Care | Payer: Medicaid Other | Source: Ambulatory Visit | Attending: Radiation Oncology | Admitting: Radiation Oncology

## 2022-04-04 DIAGNOSIS — J329 Chronic sinusitis, unspecified: Secondary | ICD-10-CM | POA: Insufficient documentation

## 2022-04-04 DIAGNOSIS — F17210 Nicotine dependence, cigarettes, uncomplicated: Secondary | ICD-10-CM | POA: Diagnosis not present

## 2022-04-04 DIAGNOSIS — C099 Malignant neoplasm of tonsil, unspecified: Secondary | ICD-10-CM

## 2022-04-04 DIAGNOSIS — Z20822 Contact with and (suspected) exposure to covid-19: Secondary | ICD-10-CM | POA: Diagnosis not present

## 2022-04-04 DIAGNOSIS — J328 Other chronic sinusitis: Secondary | ICD-10-CM

## 2022-04-04 DIAGNOSIS — C770 Secondary and unspecified malignant neoplasm of lymph nodes of head, face and neck: Secondary | ICD-10-CM | POA: Diagnosis not present

## 2022-04-04 DIAGNOSIS — Z510 Encounter for antineoplastic radiation therapy: Secondary | ICD-10-CM | POA: Diagnosis not present

## 2022-04-04 DIAGNOSIS — Z515 Encounter for palliative care: Secondary | ICD-10-CM | POA: Diagnosis not present

## 2022-04-04 DIAGNOSIS — G9001 Carotid sinus syncope: Secondary | ICD-10-CM

## 2022-04-04 DIAGNOSIS — K1230 Oral mucositis (ulcerative), unspecified: Secondary | ICD-10-CM | POA: Diagnosis not present

## 2022-04-04 DIAGNOSIS — M542 Cervicalgia: Secondary | ICD-10-CM | POA: Diagnosis not present

## 2022-04-04 DIAGNOSIS — G893 Neoplasm related pain (acute) (chronic): Secondary | ICD-10-CM | POA: Diagnosis not present

## 2022-04-04 DIAGNOSIS — Z5111 Encounter for antineoplastic chemotherapy: Secondary | ICD-10-CM | POA: Diagnosis not present

## 2022-04-04 LAB — CBC WITH DIFFERENTIAL/PLATELET
Abs Immature Granulocytes: 0.04 10*3/uL (ref 0.00–0.07)
Basophils Absolute: 0.1 10*3/uL (ref 0.0–0.1)
Basophils Relative: 1 %
Eosinophils Absolute: 0.2 10*3/uL (ref 0.0–0.5)
Eosinophils Relative: 2 %
HCT: 39.4 % (ref 39.0–52.0)
Hemoglobin: 12.7 g/dL — ABNORMAL LOW (ref 13.0–17.0)
Immature Granulocytes: 1 %
Lymphocytes Relative: 19 %
Lymphs Abs: 1.6 10*3/uL (ref 0.7–4.0)
MCH: 24.4 pg — ABNORMAL LOW (ref 26.0–34.0)
MCHC: 32.2 g/dL (ref 30.0–36.0)
MCV: 75.6 fL — ABNORMAL LOW (ref 80.0–100.0)
Monocytes Absolute: 0.7 10*3/uL (ref 0.1–1.0)
Monocytes Relative: 9 %
Neutro Abs: 5.6 10*3/uL (ref 1.7–7.7)
Neutrophils Relative %: 68 %
Platelets: 359 10*3/uL (ref 150–400)
RBC: 5.21 MIL/uL (ref 4.22–5.81)
RDW: 14.3 % (ref 11.5–15.5)
WBC: 8.2 10*3/uL (ref 4.0–10.5)
nRBC: 0 % (ref 0.0–0.2)

## 2022-04-04 LAB — COMPREHENSIVE METABOLIC PANEL
ALT: 25 U/L (ref 0–44)
AST: 20 U/L (ref 15–41)
Albumin: 3.9 g/dL (ref 3.5–5.0)
Alkaline Phosphatase: 83 U/L (ref 38–126)
Anion gap: 6 (ref 5–15)
BUN: 12 mg/dL (ref 6–20)
CO2: 25 mmol/L (ref 22–32)
Calcium: 9.1 mg/dL (ref 8.9–10.3)
Chloride: 103 mmol/L (ref 98–111)
Creatinine, Ser: 0.89 mg/dL (ref 0.61–1.24)
GFR, Estimated: >60 mL/min (ref >60)
Glucose, Bld: 95 mg/dL (ref 70–99)
Potassium: 4 mmol/L (ref 3.5–5.1)
Sodium: 134 mmol/L — ABNORMAL LOW (ref 135–145)
Total Bilirubin: 0.4 mg/dL (ref 0.3–1.2)
Total Protein: 7.5 g/dL (ref 6.5–8.1)

## 2022-04-04 LAB — RAD ONC ARIA SESSION SUMMARY
Course Elapsed Days: 8
Plan Fractions Treated to Date: 7
Plan Prescribed Dose Per Fraction: 2 Gy
Plan Total Fractions Prescribed: 35
Plan Total Prescribed Dose: 70 Gy
Reference Point Dosage Given to Date: 14 Gy
Reference Point Session Dosage Given: 2 Gy
Session Number: 7

## 2022-04-04 MED ORDER — SODIUM CHLORIDE 0.9 % IV SOLN
Freq: Once | INTRAVENOUS | Status: AC
  Filled 2022-04-04: qty 250

## 2022-04-04 MED ORDER — LIDOCAINE-PRILOCAINE 2.5-2.5 % EX CREA: 1.0000 | TOPICAL_CREAM | CUTANEOUS | 2 refills | Status: DC

## 2022-04-04 MED ORDER — AZELASTINE HCL 137 MCG/SPRAY NA SOLN: 1.0000 | Freq: Two times a day (BID) | NASAL | 1 refills | Status: DC | PRN

## 2022-04-04 MED ORDER — SODIUM CHLORIDE 0.9 % IV SOLN
10.0000 mg | Freq: Once | INTRAVENOUS | Status: AC
  Administered 2022-04-04: 10 mg via INTRAVENOUS
  Filled 2022-04-04: qty 10

## 2022-04-04 MED ORDER — SODIUM CHLORIDE 0.9 % IV SOLN
230.0000 mg | Freq: Once | INTRAVENOUS | Status: AC
  Administered 2022-04-04: 230 mg via INTRAVENOUS
  Filled 2022-04-04: qty 23

## 2022-04-04 MED ORDER — PALONOSETRON HCL INJECTION 0.25 MG/5ML
0.2500 mg | Freq: Once | INTRAVENOUS | Status: AC
  Administered 2022-04-04: 0.25 mg via INTRAVENOUS
  Filled 2022-04-04: qty 5

## 2022-04-04 NOTE — Assessment & Plan Note (Addendum)
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 Recommend concurrent chemotherapy with radiation. Labs are reviewed and discussed with patient. Proceed with carboplatin AUC 2   

## 2022-04-04 NOTE — Telephone Encounter (Signed)
Request for port placement faxed to IR scheduling.  

## 2022-04-04 NOTE — Telephone Encounter (Signed)
Rx for Boost faxed to [place].

## 2022-04-04 NOTE — Assessment & Plan Note (Signed)
Encourage oral hydration  Expect symptoms to improve with chemotherapy and radiation.

## 2022-04-04 NOTE — Progress Notes (Signed)
Nutrition Follow-up:  Patient with stage IV right tonsillar SCC with cervical lymph node mets.  Patient receiving concurrent chemotherapy and radiation.   Met with patient today in infusion.  Says that his appetite has "came back." Yesterday able to eat hamburger helper, 3 potato rolls, green beans and potatoes for dinner.  Lunch was baked chicken with 2 slices of bread and breakfast was 2 potato rolls with sausage.  Says that he likes the ensure shakes.  Drinking about 2 a day, likes it with supper meal and puts it in the freezer until slushy.  Says that pain on swallowing is about the same.  Says that he does not want feeding tube.    Has some questions for LCSW  Medications: reviewed  Labs: reviewed  Anthropometrics:   Weight 170 lb  168 lb 11.2 oz on 9/5 UBW of 176 lb   NUTRITION DIAGNOSIS: Inadequate oral intake stable    INTERVENTION:  Message sent to MD and nursing regarding required documents needed for [employer] to review and see if patient qualifies for oral nutrition supplements Encouraged patient to continue high calorie shakes Continue high calorie, high protein foods to maintain weight during treatment    MONITORING, EVALUATION, GOAL: weight trends, intake   NEXT VISIT: Wednesday, Sept 27 during infusion  Azhia Siefken B. Zenia Resides, [place], [place] Registered Dietitian [phone]

## 2022-04-04 NOTE — Telephone Encounter (Signed)
Pt scheduled for port placement on 9/18. He is aware of appt. Lidocaine cream sent to [facility] in [place].

## 2022-04-04 NOTE — Telephone Encounter (Signed)
-----   Message from Jennet Maduro, [place] sent at 04/04/2022 11:14 AM EDT ----- Dr Tasia Catchings and Benjamine Mola,  [facility] Medicaid is now covering adult oral nutrition supplements for patients who qualify. [place] Medicaid patients are eligible for coverage after submitting prior authorization.    The following needs to be submitted to [place] [phone]) for review  Signed Rx from provider (recommend boost plus chocolate TID) Chart notes from visit with MD Demographics sheet  If this is approved then [employer] will ship shakes to patients door free of charge.    Please let me know if you have questions.   Thanks, [employer]

## 2022-04-04 NOTE — Assessment & Plan Note (Signed)
He has recently finished a course of Augmentin. Recommend Azelastine nasal spracy.  otc claritine

## 2022-04-04 NOTE — Assessment & Plan Note (Signed)
Imaging indicates degenerative disease. Currently on Oxycodone as needed Follow up with palliative care

## 2022-04-04 NOTE — Progress Notes (Signed)
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 Recommend concurrent chemotherapy with radiation. Labs are reviewed and discussed with patient. Proceed with carboplatin AUC 2  Encounter for antineoplastic chemotherapy Treatment plan as listed above  Carotid sinus syndrome Encourage oral hydration  Expect symptoms to improve with chemotherapy and radiation.  Neck pain Imaging indicates degenerative disease. Currently on Oxycodone as needed Follow up with palliative care   Sinusitis, chronic He has recently finished a course of Augmentin. Recommend Azelastine nasal spracy.  otc claritine   No orders of the defined types were placed in this encounter.  Follow up 1 week lab MD carboplatin  All questions were answered. The patient knows to call the clinic with any problems, questions or concerns. No barriers to learning was detected.  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 male presents to establish care for  I have reviewed his chart and materials related to his cancer extensively and collaborated history with the patient. 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 This likely reflects an enlarged lymph node.  2 Abnormal soft tissue surrounding the mid-to-distal cervical right ICA with resultant mild vessel narrowing 3 Atherosclerotic plaque within the bilateral common carotid and cervical internal carotid arteries without hemodynamically significant stenosis. 4 Vertebral arteries patent within the neck. Mild-to-moderate atherosclerotic narrowing at the origin of the right vertebral artery. No more than mild atherosclerotic stenosis at the origin of the left vertebral artery. 5 Aortic Atherosclerosis and Emphysema 6. Cervical spondylosis   02/27/2022 Imaging   MRI neck soft tissue w wo Constellation of findings most compatible with a roughly 2.4 cm Right Tonsillar Carcinoma (series 12, image 11) with associated right level 2 and level 3 malignant nodal, ex-nodal disease (series 14, image 13). Recommend ENT consultation    02/27/2022 Imaging   02/27/2022 MRI brain without contrast showed  No acute intracranial abnormality. Abnormal right neck soft tissues. See dedicated Neck MRI today reported separately. 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 These are slow flow vascular malformations which are generally asymptomatic. [name] Other moderately advanced but nonspecific cerebral white matter signal changes.   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 Metastatic squamous cell carcinoma. Moderately differentiated with focal keratinization.  p16 negative.    02/28/2022 Cancer Staging   Staging form: Pharynx - P16 Negative Oropharynx, AJCC 8th Edition - Clinical stage from 02/28/2022: Stage IVA (cT2, cN2b, cM0, p16-) - Signed by [name],  MD on 03/22/2022 Stage prefix: Initial diagnosis   03/19/2022 Imaging   PET scan showed Signs of RIGHT neck malignancy with metastatic involvement just below the skull base and at level II/III also on the RIGHT. No signs of contralateral nodal involvement.   03/28/2022 -  Chemotherapy   Patient is on Treatment Plan : HEAD/NECK Carboplatin (AUC 2) q7d      Patient initially presented emergency room due to intermittent syncope/near syncope episodes, diaphoretic, nauseated. Imaging findings are concerning for right tonsil mass with level 2/level 3 lymph node involvement which contributes to syncope/near syncope episodes -carotid sinus syndrome. 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  INTERVAL HISTORY Clarence Skinner is a 61 y.o. male who has above history reviewed by me today presents for follow up visit for management of stage IVa right tonsil squamous cell carcinoma Patient reports feeling well today.  No additional syncope events.   + chronic nasal drainage, worse. He take Flonase, which is not effective.  Denies fever, chills, sore throat, cough, nausea vomiting  MEDICAL HISTORY:  Past Medical History:  Diagnosis Date   COPD (chronic obstructive pulmonary disease) (HCC)    GERD (gastroesophageal reflux disease)    Gout    Hypertension    Multilevel degenerative disc disease    Pneumonia 11/29/2017   Sleep apnea    CPAP   Tachycardia    Wears dentures    partial upper and lower    SURGICAL HISTORY: Past Surgical History:  Procedure Laterality Date   ABCESS DRAINAGE  2009   tonsil   COLONOSCOPY WITH PROPOFOL N/A 02/11/2018   Procedure: COLONOSCOPY WITH PROPOFOL;  Surgeon: Toledo, Benay Pike, MD;  Location: [facility];  Service: Endoscopy;  Laterality: N/A;   ESOPHAGOGASTRODUODENOSCOPY (EGD) WITH PROPOFOL N/A 02/11/2018   Procedure: ESOPHAGOGASTRODUODENOSCOPY (EGD) WITH PROPOFOL;  Surgeon: Toledo, Benay Pike, MD;  Location: [facility];  Service:  Endoscopy;  Laterality: N/A;   MASS EXCISION Right 08/14/2017   Procedure: EXCISION RIGHT TEMPORAL MASS SUBMENTAL MASS;  Surgeon: Carloyn Manner, MD;  Location: [facility];  Service: ENT;  Laterality: Right;  sleep apnea   RADIOACTIVE SEED IMPLANT N/A 01/26/2019   Procedure: RADIOACTIVE SEED IMPLANT/BRACHYTHERAPY IMPLANT;  Surgeon: Billey Co, MD;  Location: [facility];  Service: Urology;  Laterality: N/A;   SEPTOPLASTY  2016   [place]   VOLUME STUDY N/A 12/29/2018   Procedure: VOLUME STUDY;  Surgeon: Billey Co, MD;  Location: [facility];  Service: Urology;  Laterality: N/A;    SOCIAL HISTORY: Social History   Socioeconomic History   Marital status: Significant Other    Spouse name: Not on file   Number of children: Not on file   Years of education: Not on file   Highest education level: Not on file  Occupational History   Not on file  Tobacco Use   Smoking status: Every Day    Packs/day: 1.00    Years: 30.00    Total pack years: 30.00    Types: Cigarettes   Smokeless tobacco: Former    Types: Snuff, Chew    Quit date: 12/05/1984   Tobacco comments:    since age 66  Vaping Use   Vaping Use: Never used  Substance and Sexual Activity   Alcohol use: Yes    Alcohol/week: 12.0 standard drinks of alcohol    Types: 12 Cans of beer per week    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   Drug use: Not Currently  Sexual activity: Yes  Other Topics Concern   Not on file  Social History Narrative   Not on file   Social Determinants of Health   Financial Resource Strain: High Risk (04/03/2022)   Overall Financial Resource Strain (CARDIA)    Difficulty of Paying Living Expenses: Hard  Food Insecurity: Food Insecurity Present (04/03/2022)   Hunger Vital Sign    Worried About Running Out of Food in the Last Year: Often true    Ran Out of Food in the Last Year: Often true  Transportation Needs: Unmet Transportation Needs (04/03/2022)   PRAPARE -  Hydrologist (Medical): Yes    Lack of Transportation (Non-Medical): Yes  Physical Activity: Inactive (04/03/2022)   Exercise Vital Sign    Days of Exercise per Week: 0 days    Minutes of Exercise per Session: 0 min  Stress: Stress Concern Present (04/03/2022)   [place]    Feeling of Stress : Very much  Social Connections: Moderately Isolated (04/03/2022)   Social Connection and Isolation Panel [NHANES]    Frequency of Communication with Friends and Family: Never    Frequency of Social Gatherings with Friends and Family: Three times a week    Attends Religious Services: Never    Active Member of Clubs or Organizations: No    Attends Archivist Meetings: Not on file    Marital Status: Living with partner  Intimate Partner Violence: Not At Risk (04/03/2022)   Humiliation, Afraid, Rape, and Kick questionnaire    Fear of Current or Ex-Partner: No    Emotionally Abused: No    Physically Abused: No    Sexually Abused: No    FAMILY HISTORY: Family History  Problem Relation Age of Onset   Anxiety disorder Mother    Cancer Father    Hypertension Sister    Diabetes Sister    Asthma Sister    Other Brother        mva  at age 85   Gout Brother    Hypertension Brother    Prostate cancer Maternal Uncle    Prostate cancer Maternal Uncle     ALLERGIES:  is allergic to chocolate, eggs or egg-derived products, milk (cow), benadryl [diphenhydramine], and milk-related compounds.  MEDICATIONS:  Current Outpatient Medications  Medication Sig Dispense Refill   albuterol (PROVENTIL HFA;VENTOLIN HFA) 108 (90 Base) MCG/ACT inhaler Inhale into the lungs every 6 (six) hours as needed for wheezing or shortness of breath.     allopurinol (ZYLOPRIM) 300 MG tablet Take 300 mg by mouth daily.     amLODipine-benazepril (LOTREL) 10-40 MG capsule Take 1 capsule by mouth daily.      amoxicillin-clavulanate (AUGMENTIN) 875-125 MG tablet Take 1 tablet by mouth every 8 (eight) hours for 10 days. 30 tablet 0   aspirin EC 81 MG tablet Take 1 tablet (81 mg total) by mouth daily. Swallow whole. 90 tablet 3   Azelastine HCl 137 MCG/SPRAY SOLN Place 1 spray into the nose 2 (two) times daily as needed. 30 mL 1   baclofen (LIORESAL) 10 MG tablet Take 10 mg by mouth 2 (two) times daily as needed.     benzonatate (TESSALON) 100 MG capsule Take 100 mg by mouth every 8 (eight) hours as needed.     ferrous sulfate 325 (65 FE) MG tablet Take 325 mg by mouth daily.  3   fluticasone (FLONASE) 50 MCG/ACT nasal spray Place into both nostrils daily  as needed for allergies or rhinitis.     Fluticasone-Umeclidin-Vilant (TRELEGY ELLIPTA) 100-62.5-25 MCG/ACT AEPB Trelegy Ellipta 100-62.5-25 MCG/INH Inhalation Aerosol Powder Breath Activated QTY: 90 each Days: 90 Refills: 1  Written: 11/02/20 Patient Instructions: One puff once a day     furosemide (LASIX) 20 MG tablet Take 20 mg by mouth daily.     gabapentin (NEURONTIN) 100 MG capsule Take 1 capsule (100 mg total) by mouth at bedtime. 30 capsule 1   loratadine (CLARITIN) 10 MG tablet Take 10 mg by mouth daily.     meloxicam (MOBIC) 7.5 MG tablet Take 7.5 mg by mouth daily as needed for pain.     Oxycodone HCl 10 MG TABS Take 10 mg by mouth every 6 (six) hours as needed.     pantoprazole (PROTONIX) 40 MG tablet Take 40 mg by mouth daily.     rosuvastatin (CRESTOR) 20 MG tablet Take 1 tablet (20 mg total) by mouth daily. 30 tablet 3   senna (SENOKOT) 8.6 MG TABS tablet Take 1 tablet (8.6 mg total) by mouth daily. 120 tablet 0   sucralfate (CARAFATE) 1 g tablet Take 1 tablet (1 g total) by mouth 4 (four) times daily -  with meals and at bedtime. 30 tablet 2   SYMBICORT 80-4.5 MCG/ACT inhaler Inhale 1 puff into the lungs 2 (two) times daily.     tadalafil (CIALIS) 20 MG tablet Take 1 tablet (20 mg total) by mouth daily. 30 tablet 11   Vitamin D,  Ergocalciferol, (DRISDOL) 1.25 MG (50000 UNIT) CAPS capsule Take 50,000 Units by mouth once a week.     lidocaine-prilocaine (EMLA) cream Apply 1 Application topically as directed. Apply small amount of cream to port a cath site 1-2 hours prior to port being accessed. 30 g 2   prochlorperazine (COMPAZINE) 10 MG tablet Take 1 tablet (10 mg total) by mouth every 6 (six) hours as needed for nausea or vomiting. (Patient not taking: Reported on 03/27/2022) 30 tablet 1   No current facility-administered medications for this visit.    Review of Systems  Constitutional:  Positive for fatigue. Negative for chills and fever.  HENT:   Positive for hearing loss. Negative for voice change.   Eyes:  Negative for eye problems and icterus.  Respiratory:  Negative for chest tightness, cough and shortness of breath.   Cardiovascular:  Negative for chest pain and leg swelling.  Gastrointestinal:  Negative for abdominal distention and abdominal pain.  Endocrine: Negative for hot flashes.  Genitourinary:  Negative for difficulty urinating, dysuria and frequency.   Musculoskeletal:  Positive for arthralgias and neck pain.  Skin:  Negative for itching and rash.  Neurological:  Negative for light-headedness and numbness.  Hematological:  Negative for adenopathy. Does not bruise/bleed easily.  Psychiatric/Behavioral:  Negative for confusion.      PHYSICAL EXAMINATION: ECOG PERFORMANCE STATUS: 1 - Symptomatic but completely ambulatory  Vitals:   04/04/22 0948  BP: (!) 142/79  Pulse: 88  Resp: 20  Temp: 97.8 F (36.6 C)  SpO2: 98%   Filed Weights   04/04/22 0948  Weight: 170 lb (77.1 kg)    Physical Exam Constitutional:      General: He is not in acute distress.    Appearance: He is not diaphoretic.  HENT:     Head: Normocephalic and atraumatic.     Nose: Nose normal.     Mouth/Throat:     Pharynx: No oropharyngeal exudate.  Eyes:     General: No scleral  icterus.    Pupils: Pupils are equal,  round, and reactive to light.  Cardiovascular:     Rate and Rhythm: Normal rate and regular rhythm.     Heart sounds: No murmur heard. Pulmonary:     Effort: Pulmonary effort is normal. No respiratory distress.     Breath sounds: No rales.  Chest:     Chest wall: No tenderness.  Abdominal:     General: There is no distension.     Palpations: Abdomen is soft.     Tenderness: There is no abdominal tenderness.  Musculoskeletal:        General: Normal range of motion.     Cervical back: Normal range of motion and neck supple.  Skin:    General: Skin is warm and dry.     Findings: No erythema.  Neurological:     Mental Status: He is alert and oriented to person, place, and time.     Cranial Nerves: No cranial nerve deficit.     Motor: No abnormal muscle tone.     Coordination: Coordination normal.  Psychiatric:        Mood and Affect: Mood and affect normal.      LABORATORY DATA:  I have reviewed the data as listed    Latest Ref Rng & Units 04/04/2022    9:08 AM 03/28/2022   10:00 AM 03/01/2022    5:15 AM  CBC  WBC 4.0 - 10.5 K/uL 8.2  8.4  6.6   Hemoglobin 13.0 - 17.0 g/dL 12.7  14.0  11.0   Hematocrit 39.0 - 52.0 % 39.4  43.4  34.7   Platelets 150 - 400 K/uL 359  404  264       Latest Ref Rng & Units 04/04/2022    9:08 AM 03/28/2022   10:00 AM 03/01/2022    5:15 AM  CMP  Glucose 70 - 99 mg/dL 95  114  93   BUN 6 - 20 mg/dL '12  12  10   '$ Creatinine 0.61 - 1.24 mg/dL 0.89  0.95  0.96   Sodium 135 - 145 mmol/L 134  136  140   Potassium 3.5 - 5.1 mmol/L 4.0  3.9  3.5   Chloride 98 - 111 mmol/L 103  102  113   CO2 22 - 32 mmol/L '25  26  24   '$ Calcium 8.9 - 10.3 mg/dL 9.1  9.6  8.1   Total Protein 6.5 - 8.1 g/dL 7.5  8.1    Total Bilirubin 0.3 - 1.2 mg/dL 0.4  0.3    Alkaline Phos 38 - 126 U/L 83  90    AST 15 - 41 U/L 20  14    ALT 0 - 44 U/L 25  16        RADIOGRAPHIC STUDIES: I have personally reviewed the radiological images as listed and agreed with the findings in  the report. NM PET Image Initial (PI) Skull Base To Thigh (F-18 FDG)  Result Date: 03/19/2022 CLINICAL DATA:  Initial treatment strategy for head neck cancer. EXAM: NUCLEAR MEDICINE PET SKULL BASE TO THIGH TECHNIQUE: 8.62 mCi F-18 FDG was injected intravenously. Full-ring PET imaging was performed from the skull base to thigh after the radiotracer. CT data was obtained and used for attenuation correction and anatomic localization. Fasting blood glucose: 112 mg/dl COMPARISON:  Neck MRI from February 27, 2022 and CT angiography of the head and neck which was also performed in August of 2023. FINDINGS: Mediastinal blood pool  activity: SUV max 2.18 Liver activity: SUV max NA NECK: Known tonsillar mass with loss of normal soft tissue planes about the RIGHT oropharynx and parapharyngeal space (image 24/4) measuring approximately 2.1 cm with a maximum SUV of 18.98. At the RIGHT skull base is another area of increased metabolic activity corresponding nodal disease that is better illustrated on the previous neck MRI (image 16/4) just below the jugular foramen and associated with the RIGHT styloid process this measures approximately 16 mm with a maximum SUV of 17.55 (Image 30/4) large RIGHT neck mass with indistinct margins at level II/III measuring 3.5 x 2.6 cm with a maximum SUV of 17.99 No contralateral nodal disease. Incidental CT findings: Small cutaneous lesion suggested on image 26/4) 10 mm without signs of increased metabolic activity. CHEST: No hypermetabolic mediastinal or hilar nodes. No suspicious pulmonary nodules on the CT scan. Incidental CT findings: Aortic atherosclerosis with calcification. Normal heart size. No pericardial effusion. No adenopathy by size criteria in the chest. Signs of moderate pulmonary emphysema at the lung apices. No effusion, consolidation or airway abnormality. ABDOMEN/PELVIS: No abnormal hypermetabolic activity within the liver, pancreas, adrenal glands, or spleen. No hypermetabolic  lymph nodes in the abdomen or pelvis. Incidental CT findings: Liver with smooth contours. No acute process related to the gallbladder, pancreas, spleen, adrenal glands, kidneys, urinary bladder, stomach, small or large bowel. The appendix is normal. Aortic atherosclerosis. No aneurysmal dilation. No adenopathy in the abdomen or in the pelvis by size criteria. 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 SKELETON: No focal hypermetabolic activity to suggest skeletal metastasis. Facet arthropathy on the RIGHT at C6-C7 which is asymmetric to contralateral C6-C7 displays FDG uptake which is asymmetric but favored to be related to degenerative process. Maximum SUV 4.6. Incidental CT findings: None. IMPRESSION: Signs of RIGHT neck malignancy with metastatic involvement just below the skull base and at level II/III also on the RIGHT. No signs of contralateral nodal involvement. No signs of distant metastatic disease. Electronically Signed   By: Zetta Bills M.D.   On: 03/19/2022 15:45

## 2022-04-04 NOTE — Assessment & Plan Note (Signed)
Treatment plan as listed above. 

## 2022-04-04 NOTE — Patient Instructions (Signed)
[facility]  Discharge Instructions: Thank you for choosing [facility] to provide your oncology and hematology care.  If you have a lab appointment with the Cancer Center, please go directly to the Cancer Center and check in at the registration area.  Wear comfortable clothing and clothing appropriate for easy access to any Portacath or PICC line.   We strive to give you quality time with your provider. You may need to reschedule your appointment if you arrive late (15 or more minutes).  Arriving late affects you and other patients whose appointments are after yours.  Also, if you miss three or more appointments without notifying the office, you may be dismissed from the clinic at the provider's discretion.      For prescription refill requests, have your pharmacy contact our office and allow 72 hours for refills to be completed.    Today you received the following chemotherapy and/or immunotherapy agents: Carboplatin      To help prevent nausea and vomiting after your treatment, we encourage you to take your nausea medication as directed.  BELOW ARE SYMPTOMS THAT SHOULD BE REPORTED IMMEDIATELY: *FEVER GREATER THAN 100.4 F (38 C) OR HIGHER *CHILLS OR SWEATING *NAUSEA AND VOMITING THAT IS NOT CONTROLLED WITH YOUR NAUSEA MEDICATION *UNUSUAL SHORTNESS OF BREATH *UNUSUAL BRUISING OR BLEEDING *URINARY PROBLEMS (pain or burning when urinating, or frequent urination) *BOWEL PROBLEMS (unusual diarrhea, constipation, pain near the anus) TENDERNESS IN MOUTH AND THROAT WITH OR WITHOUT PRESENCE OF ULCERS (sore throat, sores in mouth, or a toothache) UNUSUAL RASH, SWELLING OR PAIN  UNUSUAL VAGINAL DISCHARGE OR ITCHING   Items with * indicate a potential emergency and should be followed up as soon as possible or go to the Emergency Department if any problems should occur.  Please show the CHEMOTHERAPY ALERT CARD or IMMUNOTHERAPY ALERT CARD at check-in  to the Emergency Department and triage nurse.  Should you have questions after your visit or need to cancel or reschedule your appointment, please contact [facility]  [phone] and follow the prompts.  Office hours are 8:00 a.m. to 4:30 p.m. Monday - Friday. Please note that voicemails left after 4:00 p.m. may not be returned until the following business day.  We are closed weekends and major holidays. You have access to a nurse at all times for urgent questions. Please call the main number to the clinic [phone] and follow the prompts.  For any non-urgent questions, you may also contact your provider using MyChart. We now offer e-Visits for anyone 18 and older to request care online for non-urgent symptoms. For details visit [email or web address].   Also download the MyChart app! Go to the app store, search "MyChart", open the app, select [facility], and log in with your MyChart username and password.  Masks are optional in the cancer centers. If you would like for your care team to wear a mask while they are taking care of you, please let them know. For doctor visits, patients may have with them one support person who is at least 59 years old. At this time, visitors are not allowed in the infusion area.   

## 2022-04-05 ENCOUNTER — Ambulatory Visit
Admission: RE | Admit: 2022-04-05 | Discharge: 2022-04-05 | Disposition: A | Discharge status: Home / Self Care | Payer: Medicaid Other | Source: Ambulatory Visit | Attending: Radiation Oncology | Admitting: Radiation Oncology

## 2022-04-05 ENCOUNTER — Other Ambulatory Visit: Payer: Self-pay

## 2022-04-05 DIAGNOSIS — K1230 Oral mucositis (ulcerative), unspecified: Secondary | ICD-10-CM | POA: Diagnosis not present

## 2022-04-05 DIAGNOSIS — Z510 Encounter for antineoplastic radiation therapy: Secondary | ICD-10-CM | POA: Diagnosis not present

## 2022-04-05 DIAGNOSIS — Z515 Encounter for palliative care: Secondary | ICD-10-CM | POA: Diagnosis not present

## 2022-04-05 DIAGNOSIS — C770 Secondary and unspecified malignant neoplasm of lymph nodes of head, face and neck: Secondary | ICD-10-CM | POA: Diagnosis not present

## 2022-04-05 DIAGNOSIS — F17210 Nicotine dependence, cigarettes, uncomplicated: Secondary | ICD-10-CM | POA: Diagnosis not present

## 2022-04-05 DIAGNOSIS — C099 Malignant neoplasm of tonsil, unspecified: Secondary | ICD-10-CM | POA: Diagnosis not present

## 2022-04-05 DIAGNOSIS — Z5111 Encounter for antineoplastic chemotherapy: Secondary | ICD-10-CM | POA: Diagnosis not present

## 2022-04-05 DIAGNOSIS — Z20822 Contact with and (suspected) exposure to covid-19: Secondary | ICD-10-CM | POA: Diagnosis not present

## 2022-04-05 DIAGNOSIS — G9001 Carotid sinus syncope: Secondary | ICD-10-CM | POA: Diagnosis not present

## 2022-04-05 DIAGNOSIS — G893 Neoplasm related pain (acute) (chronic): Secondary | ICD-10-CM | POA: Diagnosis not present

## 2022-04-05 LAB — RAD ONC ARIA SESSION SUMMARY
Course Elapsed Days: 9
Plan Fractions Treated to Date: 8
Plan Prescribed Dose Per Fraction: 2 Gy
Plan Total Fractions Prescribed: 35
Plan Total Prescribed Dose: 70 Gy
Reference Point Dosage Given to Date: 16 Gy
Reference Point Session Dosage Given: 2 Gy
Session Number: 8

## 2022-04-06 ENCOUNTER — Other Ambulatory Visit: Payer: Self-pay | Admitting: Physician Assistant

## 2022-04-06 ENCOUNTER — Other Ambulatory Visit: Payer: Self-pay

## 2022-04-06 ENCOUNTER — Ambulatory Visit
Admission: RE | Admit: 2022-04-06 | Discharge: 2022-04-06 | Disposition: A | Discharge status: Home / Self Care | Payer: Medicaid Other | Source: Ambulatory Visit | Attending: Radiation Oncology | Admitting: Radiation Oncology

## 2022-04-06 DIAGNOSIS — Z5111 Encounter for antineoplastic chemotherapy: Secondary | ICD-10-CM | POA: Diagnosis not present

## 2022-04-06 DIAGNOSIS — Z515 Encounter for palliative care: Secondary | ICD-10-CM | POA: Diagnosis not present

## 2022-04-06 DIAGNOSIS — K1230 Oral mucositis (ulcerative), unspecified: Secondary | ICD-10-CM | POA: Diagnosis not present

## 2022-04-06 DIAGNOSIS — G9001 Carotid sinus syncope: Secondary | ICD-10-CM | POA: Diagnosis not present

## 2022-04-06 DIAGNOSIS — C099 Malignant neoplasm of tonsil, unspecified: Secondary | ICD-10-CM | POA: Diagnosis not present

## 2022-04-06 DIAGNOSIS — F17210 Nicotine dependence, cigarettes, uncomplicated: Secondary | ICD-10-CM | POA: Diagnosis not present

## 2022-04-06 DIAGNOSIS — G893 Neoplasm related pain (acute) (chronic): Secondary | ICD-10-CM | POA: Diagnosis not present

## 2022-04-06 DIAGNOSIS — Z510 Encounter for antineoplastic radiation therapy: Secondary | ICD-10-CM | POA: Diagnosis not present

## 2022-04-06 DIAGNOSIS — Z20822 Contact with and (suspected) exposure to covid-19: Secondary | ICD-10-CM | POA: Diagnosis not present

## 2022-04-06 DIAGNOSIS — C770 Secondary and unspecified malignant neoplasm of lymph nodes of head, face and neck: Secondary | ICD-10-CM | POA: Diagnosis not present

## 2022-04-06 DIAGNOSIS — R221 Localized swelling, mass and lump, neck: Secondary | ICD-10-CM

## 2022-04-06 LAB — RAD ONC ARIA SESSION SUMMARY
Course Elapsed Days: 10
Plan Fractions Treated to Date: 9
Plan Prescribed Dose Per Fraction: 2 Gy
Plan Total Fractions Prescribed: 35
Plan Total Prescribed Dose: 70 Gy
Reference Point Dosage Given to Date: 18 Gy
Reference Point Session Dosage Given: 2 Gy
Session Number: 9

## 2022-04-06 MED ORDER — ROSUVASTATIN CALCIUM 20 MG PO TABS: 20.0000 mg | ORAL_TABLET | Freq: Every day | ORAL | 5 refills | Status: DC

## 2022-04-09 ENCOUNTER — Other Ambulatory Visit: Payer: Self-pay

## 2022-04-09 ENCOUNTER — Encounter: Payer: Self-pay | Admitting: Radiology

## 2022-04-09 ENCOUNTER — Ambulatory Visit: Payer: Medicaid Other

## 2022-04-09 ENCOUNTER — Ambulatory Visit
Admission: RE | Admit: 2022-04-09 | Discharge: 2022-04-09 | Disposition: A | Discharge status: Home / Self Care | Payer: Medicaid Other | Source: Ambulatory Visit | Attending: Oncology | Admitting: Oncology

## 2022-04-09 ENCOUNTER — Ambulatory Visit
Admission: RE | Admit: 2022-04-09 | Discharge: 2022-04-09 | Disposition: A | Discharge status: Home / Self Care | Payer: Medicaid Other | Source: Ambulatory Visit | Attending: Radiation Oncology | Admitting: Radiation Oncology

## 2022-04-09 DIAGNOSIS — Z87891 Personal history of nicotine dependence: Secondary | ICD-10-CM | POA: Diagnosis not present

## 2022-04-09 DIAGNOSIS — J449 Chronic obstructive pulmonary disease, unspecified: Secondary | ICD-10-CM | POA: Insufficient documentation

## 2022-04-09 DIAGNOSIS — C61 Malignant neoplasm of prostate: Secondary | ICD-10-CM | POA: Diagnosis not present

## 2022-04-09 DIAGNOSIS — Z452 Encounter for adjustment and management of vascular access device: Secondary | ICD-10-CM | POA: Diagnosis not present

## 2022-04-09 DIAGNOSIS — C770 Secondary and unspecified malignant neoplasm of lymph nodes of head, face and neck: Secondary | ICD-10-CM | POA: Diagnosis not present

## 2022-04-09 DIAGNOSIS — Z510 Encounter for antineoplastic radiation therapy: Secondary | ICD-10-CM | POA: Diagnosis not present

## 2022-04-09 DIAGNOSIS — G9001 Carotid sinus syncope: Secondary | ICD-10-CM | POA: Diagnosis not present

## 2022-04-09 DIAGNOSIS — R55 Syncope and collapse: Secondary | ICD-10-CM | POA: Diagnosis not present

## 2022-04-09 DIAGNOSIS — I10 Essential (primary) hypertension: Secondary | ICD-10-CM | POA: Insufficient documentation

## 2022-04-09 DIAGNOSIS — C099 Malignant neoplasm of tonsil, unspecified: Secondary | ICD-10-CM | POA: Diagnosis not present

## 2022-04-09 DIAGNOSIS — G893 Neoplasm related pain (acute) (chronic): Secondary | ICD-10-CM | POA: Diagnosis not present

## 2022-04-09 DIAGNOSIS — K1230 Oral mucositis (ulcerative), unspecified: Secondary | ICD-10-CM | POA: Diagnosis not present

## 2022-04-09 DIAGNOSIS — R001 Bradycardia, unspecified: Secondary | ICD-10-CM | POA: Diagnosis not present

## 2022-04-09 DIAGNOSIS — Z20822 Contact with and (suspected) exposure to covid-19: Secondary | ICD-10-CM | POA: Diagnosis not present

## 2022-04-09 DIAGNOSIS — Z5111 Encounter for antineoplastic chemotherapy: Secondary | ICD-10-CM | POA: Diagnosis not present

## 2022-04-09 DIAGNOSIS — R221 Localized swelling, mass and lump, neck: Secondary | ICD-10-CM

## 2022-04-09 DIAGNOSIS — F17210 Nicotine dependence, cigarettes, uncomplicated: Secondary | ICD-10-CM | POA: Diagnosis not present

## 2022-04-09 DIAGNOSIS — Z515 Encounter for palliative care: Secondary | ICD-10-CM | POA: Diagnosis not present

## 2022-04-09 HISTORY — PX: IR IMAGING GUIDED PORT INSERTION: IMG5740

## 2022-04-09 LAB — RAD ONC ARIA SESSION SUMMARY
Course Elapsed Days: 13
Plan Fractions Treated to Date: 10
Plan Prescribed Dose Per Fraction: 2 Gy
Plan Total Fractions Prescribed: 35
Plan Total Prescribed Dose: 70 Gy
Reference Point Dosage Given to Date: 20 Gy
Reference Point Session Dosage Given: 2 Gy
Session Number: 10

## 2022-04-09 MED ORDER — SODIUM CHLORIDE 0.9 % IV SOLN: INTRAVENOUS | Status: DC

## 2022-04-09 MED ORDER — MIDAZOLAM HCL 2 MG/2ML IJ SOLN
INTRAMUSCULAR | Status: AC | PRN
  Administered 2022-04-09 (×2): 1 mg via INTRAVENOUS

## 2022-04-09 MED ORDER — MIDAZOLAM HCL 2 MG/2ML IJ SOLN
INTRAMUSCULAR | Status: AC
  Filled 2022-04-09: qty 2

## 2022-04-09 MED ORDER — FENTANYL CITRATE (PF) 100 MCG/2ML IJ SOLN
INTRAMUSCULAR | Status: AC | PRN
  Administered 2022-04-09 (×2): 50 ug via INTRAVENOUS

## 2022-04-09 MED ORDER — HEPARIN SOD (PORK) LOCK FLUSH 100 UNIT/ML IV SOLN
INTRAVENOUS | Status: AC
  Administered 2022-04-09: 500 [IU]
  Filled 2022-04-09: qty 5

## 2022-04-09 MED ORDER — FENTANYL CITRATE (PF) 100 MCG/2ML IJ SOLN
INTRAMUSCULAR | Status: AC
  Filled 2022-04-09: qty 2

## 2022-04-09 MED ORDER — LIDOCAINE-EPINEPHRINE 1 %-1:100000 IJ SOLN
INTRAMUSCULAR | Status: AC
  Administered 2022-04-09: 17 mL
  Filled 2022-04-09: qty 1

## 2022-04-09 NOTE — Progress Notes (Signed)
Patient clinically stable post port placement per Dr Serafina Royals, tolerated well. Vitals stable pre and post procedure. Received Versed 2 mg along with Fentanyl 100 mcg IV for procedure. Awake entire procedure, but stable along with local for procedure. Report given to Sarita Haver post procedure/specials.

## 2022-04-09 NOTE — H&P (Signed)
Chief Complaint: Patient was seen in consultation today for port-a-catheter placement  Referring Physician(s): Yu,Zhou  Supervising Physician: Ruthann Cancer  Patient Status: [facility] - Out-pt  History of Present Illness: Clarence Skinner is a 61 y.o. male with a medical history significant for COPD, bradycardia, recurrent syncope, HTN and prostate cancer.    He presented to the ED 02/26/22 after experiencing multiple syncopal episodes. He also had complaints of worsening right neck pain. The patient had previously been seen in the ED a few weeks prior with similar complaints. 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 He was referred to IR for a lymph node biopsy and this was performed 02/28/22. 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 His oncology team has initiated chemotherapy via peripheral veins but he will require durable long-term venous access.    Interventional Radiology has been asked to evaluate this patient for an image-guided port-a-catheter placement to facilitate his treatment goals.   Past Medical History:  Diagnosis Date   COPD (chronic obstructive pulmonary disease) (HCC)    GERD (gastroesophageal reflux disease)    Gout    Hypertension    Multilevel degenerative disc disease    Pneumonia 11/29/2017   Sleep apnea    CPAP   Tachycardia    Wears dentures    partial upper and lower    Past Surgical History:  Procedure Laterality Date   ABCESS DRAINAGE  2009   tonsil   COLONOSCOPY WITH PROPOFOL N/A 02/11/2018   Procedure: COLONOSCOPY WITH PROPOFOL;  Surgeon: Toledo, Benay Pike, MD;  Location: [facility] END[facility];  Service: Endoscopy;  Laterality: N/A;   ESOPHAGOGASTRODUODENOSCOPY (EGD) WITH PROPOFOL N/A 02/11/2018   Procedure: ESOPHAGOGASTRODUODENOSCOPY (EGD) WITH PROPOFOL;  Surgeon: Toledo, Benay Pike, MD;  Location: [facility];  Service: Endoscopy;  Laterality: N/A;   MASS EXCISION Right 08/14/2017    Procedure: EXCISION RIGHT TEMPORAL MASS SUBMENTAL MASS;  Surgeon: Carloyn Manner, MD;  Location: [place];  Service: ENT;  Laterality: Right;  sleep apnea   RADIOACTIVE SEED IMPLANT N/A 01/26/2019   Procedure: RADIOACTIVE SEED IMPLANT/BRACHYTHERAPY IMPLANT;  Surgeon: Billey Co, MD;  Location: [facility] OR[facility];  Service: Urology;  Laterality: N/A;   SEPTOPLASTY  2016   [place]   VOLUME STUDY N/A 12/29/2018   Procedure: VOLUME STUDY;  Surgeon: Billey Co, MD;  Location: [facility] OR[facility];  Service: Urology;  Laterality: N/A;    Allergies: Chocolate, Eggs or egg-derived products, Milk (cow), Benadryl [diphenhydramine], and Milk-related compounds  Medications: Prior to Admission medications   Medication Sig Start Date End Date Taking? Authorizing Provider  albuterol (PROVENTIL HFA;VENTOLIN HFA) 108 (90 Base) MCG/ACT inhaler Inhale into the lungs every 6 (six) hours as needed for wheezing or shortness of breath.    [provider]  allopurinol (ZYLOPRIM) 300 MG tablet Take 300 mg by mouth daily.    [provider]  amLODipine-benazepril (LOTREL) 10-40 MG capsule Take 1 capsule by mouth daily.    [provider]  aspirin EC 81 MG tablet Take 1 tablet (81 mg total) by mouth daily. Swallow whole. 02/23/22   Kate Sable, MD  Azelastine HCl 137 MCG/SPRAY SOLN Place 1 spray into the nose 2 (two) times daily as needed. 04/04/22   [name], MD  baclofen (LIORESAL) 10 MG tablet Take 10 mg by mouth 2 (two) times daily as needed. 02/20/22   [provider]  benzonatate (TESSALON) 100 MG capsule Take 100 mg by mouth every 8 (eight) hours  as needed. 02/23/22   [provider]  ferrous sulfate 325 (65 FE) MG tablet Take 325 mg by mouth daily. 09/30/17   [provider]  fluticasone (FLONASE) 50 MCG/ACT nasal spray Place into both nostrils daily as needed for allergies or rhinitis.    [provider]  Fluticasone-Umeclidin-Vilant (TRELEGY  ELLIPTA) 100-62.5-25 MCG/ACT AEPB Trelegy Ellipta 100-62.5-25 MCG/INH Inhalation Aerosol Powder Breath Activated QTY: 90 each Days: 90 Refills: 1  Written: 11/02/20 Patient Instructions: One puff once a day 11/02/20   [provider]  furosemide (LASIX) 20 MG tablet Take 20 mg by mouth daily. 01/30/22   [provider]  gabapentin (NEURONTIN) 100 MG capsule Take 1 capsule (100 mg total) by mouth at bedtime. 03/27/22   Borders, Kirt Boys, NP  lidocaine-prilocaine (EMLA) cream Apply 1 Application topically as directed. Apply small amount of cream to port a cath site 1-2 hours prior to port being accessed. 04/04/22   [name], MD  loratadine (CLARITIN) 10 MG tablet Take 10 mg by mouth daily. 12/07/21   [provider]  meloxicam (MOBIC) 7.5 MG tablet Take 7.5 mg by mouth daily as needed for pain.    [provider]  Oxycodone HCl 10 MG TABS Take 10 mg by mouth every 6 (six) hours as needed. 03/16/22   [provider]  pantoprazole (PROTONIX) 40 MG tablet Take 40 mg by mouth daily.    [provider]  prochlorperazine (COMPAZINE) 10 MG tablet Take 1 tablet (10 mg total) by mouth every 6 (six) hours as needed for nausea or vomiting. Patient not taking: Reported on 03/27/2022 03/22/22   [name], MD  rosuvastatin (CRESTOR) 20 MG tablet Take 1 tablet (20 mg total) by mouth daily. 04/06/22 07/06/23  Kate Sable, MD  senna (SENOKOT) 8.6 MG TABS tablet Take 1 tablet (8.6 mg total) by mouth daily. 03/27/22   Borders, Kirt Boys, NP  sucralfate (CARAFATE) 1 g tablet Take 1 tablet (1 g total) by mouth 4 (four) times daily -  with meals and at bedtime. 03/27/22   Borders, Kirt Boys, NP  SYMBICORT 80-4.5 MCG/ACT inhaler Inhale 1 puff into the lungs 2 (two) times daily. 02/14/22   [provider]  tadalafil (CIALIS) 20 MG tablet Take 1 tablet (20 mg total) by mouth daily. 12/27/21   Billey Co, MD  Vitamin D, Ergocalciferol, (DRISDOL) 1.25 MG (50000 UNIT) CAPS  capsule Take 50,000 Units by mouth once a week. 10/11/21   [provider]     Family History  Problem Relation Age of Onset   Anxiety disorder Mother    Cancer Father    Hypertension Sister    Diabetes Sister    Asthma Sister    Other Brother        mva  at age 28   Gout Brother    Hypertension Brother    Prostate cancer Maternal Uncle    Prostate cancer Maternal Uncle     Social History   Socioeconomic History   Marital status: Significant Other    Spouse name: Not on file   Number of children: Not on file   Years of education: Not on file   Highest education level: Not on file  Occupational History   Not on file  Tobacco Use   Smoking status: Every Day    Packs/day: 1.00    Years: 30.00    Total pack years: 30.00    Types: Cigarettes   Smokeless tobacco: Former  Types: Snuff, Chew    Quit date: 12/05/1984   Tobacco comments:    since age 69  Vaping Use   Vaping Use: Never used  Substance and Sexual Activity   Alcohol use: Yes    Alcohol/week: 12.0 standard drinks of alcohol    Types: 12 Cans of beer per week    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   Drug use: Not Currently   Sexual activity: Yes  Other Topics Concern   Not on file  Social History Narrative   Not on file   Social Determinants of Health   Financial Resource Strain: High Risk (04/03/2022)   Overall Financial Resource Strain (CARDIA)    Difficulty of Paying Living Expenses: Hard  Food Insecurity: Food Insecurity Present (04/03/2022)   Hunger Vital Sign    Worried About Running Out of Food in the Last Year: Often true    Ran Out of Food in the Last Year: Often true  Transportation Needs: Unmet Transportation Needs (04/03/2022)   PRAPARE - Hydrologist (Medical): Yes    Lack of Transportation (Non-Medical): Yes  Physical Activity: Inactive (04/03/2022)   Exercise Vital Sign    Days of Exercise per Week: 0 days    Minutes of Exercise  per Session: 0 min  Stress: Stress Concern Present (04/03/2022)   [place]    Feeling of Stress : Very much  Social Connections: Moderately Isolated (04/03/2022)   Social Connection and Isolation Panel [NHANES]    Frequency of Communication with Friends and Family: Never    Frequency of Social Gatherings with Friends and Family: Three times a week    Attends Religious Services: Never    Active Member of Clubs or Organizations: No    Attends Music therapist: Not on file    Marital Status: Living with partner    Review of Systems: A 12 point ROS discussed and pertinent positives are indicated in the HPI above.  All other systems are negative.  Review of Systems  Constitutional:  Negative for appetite change and fatigue.  HENT:         Right neck palpable mass   Respiratory:  Negative for cough and shortness of breath.   Cardiovascular:  Negative for chest pain.  Gastrointestinal:  Negative for abdominal pain, diarrhea, nausea and vomiting.  Neurological:  Positive for headaches. Negative for dizziness.    Vital Signs: BP (!) 140/84   Temp 99.6 F (37.6 C) (Oral)   Resp 18   SpO2 95%   Physical Exam Constitutional:      General: He is not in acute distress.    Appearance: He is not ill-appearing.  HENT:     Mouth/Throat:     Mouth: Mucous membranes are moist.     Pharynx: Oropharynx is clear.  Neck:     Comments: Right neck palpable mass  Cardiovascular:     Rate and Rhythm: Normal rate and regular rhythm.     Pulses: Normal pulses.     Heart sounds: Normal heart sounds.  Pulmonary:     Effort: Pulmonary effort is normal.     Breath sounds: Normal breath sounds.  Abdominal:     General: Bowel sounds are normal.     Palpations: Abdomen is soft.  Musculoskeletal:     Right lower leg: No edema.     Left lower leg: No edema.  Skin:    General:  Skin is warm and dry.  Neurological:      Mental Status: He is alert and oriented to person, place, and time.     Imaging: NM PET Image Initial (PI) Skull Base To Thigh (F-18 FDG)  Result Date: 03/19/2022 CLINICAL DATA:  Initial treatment strategy for head neck cancer. EXAM: NUCLEAR MEDICINE PET SKULL BASE TO THIGH TECHNIQUE: 8.62 mCi F-18 FDG was injected intravenously. Full-ring PET imaging was performed from the skull base to thigh after the radiotracer. CT data was obtained and used for attenuation correction and anatomic localization. Fasting blood glucose: 112 mg/dl COMPARISON:  Neck MRI from February 27, 2022 and CT angiography of the head and neck which was also performed in August of 2023. FINDINGS: Mediastinal blood pool activity: SUV max 2.18 Liver activity: SUV max NA NECK: Known tonsillar mass with loss of normal soft tissue planes about the RIGHT oropharynx and parapharyngeal space (image 24/4) measuring approximately 2.1 cm with a maximum SUV of 18.98. At the RIGHT skull base is another area of increased metabolic activity corresponding nodal disease that is better illustrated on the previous neck MRI (image 16/4) just below the jugular foramen and associated with the RIGHT styloid process this measures approximately 16 mm with a maximum SUV of 17.55 (Image 30/4) large RIGHT neck mass with indistinct margins at level II/III measuring 3.5 x 2.6 cm with a maximum SUV of 17.99 No contralateral nodal disease. Incidental CT findings: Small cutaneous lesion suggested on image 26/4) 10 mm without signs of increased metabolic activity. CHEST: No hypermetabolic mediastinal or hilar nodes. No suspicious pulmonary nodules on the CT scan. Incidental CT findings: Aortic atherosclerosis with calcification. Normal heart size. No pericardial effusion. No adenopathy by size criteria in the chest. Signs of moderate pulmonary emphysema at the lung apices. No effusion, consolidation or airway abnormality. ABDOMEN/PELVIS: No abnormal hypermetabolic activity  within the liver, pancreas, adrenal glands, or spleen. No hypermetabolic lymph nodes in the abdomen or pelvis. Incidental CT findings: Liver with smooth contours. No acute process related to the gallbladder, pancreas, spleen, adrenal glands, kidneys, urinary bladder, stomach, small or large bowel. The appendix is normal. Aortic atherosclerosis. No aneurysmal dilation. No adenopathy in the abdomen or in the pelvis by size criteria. 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 SKELETON: No focal hypermetabolic activity to suggest skeletal metastasis. Facet arthropathy on the RIGHT at C6-C7 which is asymmetric to contralateral C6-C7 displays FDG uptake which is asymmetric but favored to be related to degenerative process. Maximum SUV 4.6. Incidental CT findings: None. IMPRESSION: Signs of RIGHT neck malignancy with metastatic involvement just below the skull base and at level II/III also on the RIGHT. No signs of contralateral nodal involvement. No signs of distant metastatic disease. Electronically Signed   By: Zetta Bills M.D.   On: 03/19/2022 15:45    Labs:  CBC: Recent Labs    02/27/22 0446 03/01/22 0515 03/28/22 1000 04/04/22 0908  WBC 6.4 6.6 8.4 8.2  HGB 12.1* 11.0* 14.0 12.7*  HCT 37.9* 34.7* 43.4 39.4  PLT 263 264 404* 359    COAGS: No results for input(s): "INR", "APTT" in the last 8760 hours.  BMP: Recent Labs    02/27/22 0446 03/01/22 0515 03/28/22 1000 04/04/22 0908  NA 137 140 136 134*  K 4.0 3.5 3.9 4.0  CL 112* 113* 102 103  CO2 19* '24 26 25  '$ GLUCOSE 111* 93 114* 95  BUN '11 10 12 12  '$ CALCIUM 7.6* 8.1* 9.6 9.1  CREATININE 0.87 0.96 0.95 0.89  GFRNONAA >60 >60 >60 >60    LIVER FUNCTION TESTS: Recent Labs    02/26/22 1504 02/27/22 0446 03/28/22 1000 04/04/22 0908  BILITOT 0.5 0.5 0.3 0.4  AST 15 11* 14* 20  ALT '9 10 16 25  '$ ALKPHOS 54 56 90 83  PROT 6.6 6.1* 8.1 7.5  ALBUMIN 3.7 3.4* 4.3 3.9    TUMOR MARKERS: No results for input(s): "AFPTM", "CEA", "CA199",  "CHROMGRNA" in the last 8760 hours.  Assessment and Plan:  Primary tonsillar squamous cell carcinoma with metastases; chemotherapy: [name], 61 year old male, presents today to the [facility] Interventional Radiology department for an image-guided port-a-catheter placement to facilitate his treatment plans.   Risks and benefits of image-guided port-a-catheter placement were discussed with the patient including, but not limited to bleeding, infection, pneumothorax, or fibrin sheath development and need for additional procedures.  All of the patient's questions were answered, patient is agreeable to proceed. He has been NPO.   Consent signed and in chart.  Thank you for this interesting consult.  I greatly enjoyed meeting Aakash Hollomon and look forward to participating in their care.  A copy of this report was sent to the requesting provider on this date.  Electronically Signed: Soyla Dryer, AGACNP-BC [phone] 04/09/2022, 11:08 AM   I spent a total of  30 Minutes   in face to face in clinical consultation, greater than 50% of which was counseling/coordinating care for port-a-catheter placement

## 2022-04-09 NOTE — Procedures (Signed)
Interventional Radiology Procedure Note ° °Procedure: Single Lumen Power Port Placement   ° °Access:  Right internal jugular vein ° °Findings: Catheter tip positioned at cavoatrial junction. Port is ready for immediate use.  ° °Complications: None ° °EBL: < 10 mL ° °Recommendations:  °- Ok to shower in 24 hours °- Do not submerge for 7 days °- Routine line care  ° ° °Latorsha Curling, MD ° ° ° °

## 2022-04-10 ENCOUNTER — Other Ambulatory Visit: Payer: Self-pay

## 2022-04-10 ENCOUNTER — Ambulatory Visit
Admission: RE | Admit: 2022-04-10 | Discharge: 2022-04-10 | Disposition: A | Discharge status: Home / Self Care | Payer: Medicaid Other | Source: Ambulatory Visit | Attending: Radiation Oncology | Admitting: Radiation Oncology

## 2022-04-10 ENCOUNTER — Other Ambulatory Visit: Payer: Self-pay | Admitting: *Deleted

## 2022-04-10 ENCOUNTER — Inpatient Hospital Stay (HOSPITAL_BASED_OUTPATIENT_CLINIC_OR_DEPARTMENT_OTHER): Payer: Medicaid Other | Admitting: Hospice and Palliative Medicine

## 2022-04-10 DIAGNOSIS — C099 Malignant neoplasm of tonsil, unspecified: Secondary | ICD-10-CM | POA: Diagnosis not present

## 2022-04-10 DIAGNOSIS — F17210 Nicotine dependence, cigarettes, uncomplicated: Secondary | ICD-10-CM | POA: Diagnosis not present

## 2022-04-10 DIAGNOSIS — Z515 Encounter for palliative care: Secondary | ICD-10-CM

## 2022-04-10 DIAGNOSIS — Z510 Encounter for antineoplastic radiation therapy: Secondary | ICD-10-CM | POA: Diagnosis not present

## 2022-04-10 DIAGNOSIS — K1230 Oral mucositis (ulcerative), unspecified: Secondary | ICD-10-CM | POA: Diagnosis not present

## 2022-04-10 DIAGNOSIS — Z5111 Encounter for antineoplastic chemotherapy: Secondary | ICD-10-CM | POA: Diagnosis not present

## 2022-04-10 DIAGNOSIS — C770 Secondary and unspecified malignant neoplasm of lymph nodes of head, face and neck: Secondary | ICD-10-CM | POA: Diagnosis not present

## 2022-04-10 DIAGNOSIS — Z20822 Contact with and (suspected) exposure to covid-19: Secondary | ICD-10-CM | POA: Diagnosis not present

## 2022-04-10 DIAGNOSIS — G893 Neoplasm related pain (acute) (chronic): Secondary | ICD-10-CM | POA: Diagnosis not present

## 2022-04-10 DIAGNOSIS — G9001 Carotid sinus syncope: Secondary | ICD-10-CM | POA: Diagnosis not present

## 2022-04-10 LAB — RAD ONC ARIA SESSION SUMMARY
Course Elapsed Days: 14
Plan Fractions Treated to Date: 11
Plan Prescribed Dose Per Fraction: 2 Gy
Plan Total Fractions Prescribed: 35
Plan Total Prescribed Dose: 70 Gy
Reference Point Dosage Given to Date: 22 Gy
Reference Point Session Dosage Given: 2 Gy
Session Number: 11

## 2022-04-10 MED FILL — Dexamethasone Sodium Phosphate Inj 100 MG/10ML: INTRAMUSCULAR | Qty: 1 | Status: AC

## 2022-04-10 NOTE — Progress Notes (Signed)
Virtual Visit via Telephone Note  I connected with Clarence Skinner on 04/10/22 at  1:00 PM EDT by telephone and verified that I am speaking with the correct person using two identifiers.  Location: Patient: Home Provider: Clinic   I discussed the limitations, risks, security and privacy concerns of performing an evaluation and management service by telephone and the availability of in person appointments. I also discussed with the patient that there may be a patient responsible charge related to this service. The patient expressed understanding and agreed to proceed.   History of Present Illness: Clarence Skinner is a 61 y.o. 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  Plan is for concurrent chemoradiation.  Patient has had ongoing neck pain with imaging revealing degenerative disease.  He was referred to palliative care to address goals and manage ongoing symptoms.   Observations/Objective: I called and spoke with patient by phone.  He reports that he is doing reasonably well.  He states that he had a headache after radiation treatment and has had sore throat leading to Dr. Baruch Gouty prescribing sucralfate.  Patient states that pain is stable.  He has had some mucus draining down the throat leading to a dry cough.  He feels like appetite has improved.  Assessment and Plan: Neoplasm related pain -continue as needed oxycodone  Follow Up Instructions: Follow-up telephone visit 1 month   I discussed the assessment and treatment plan with the patient. The patient was provided an opportunity to ask questions and all were answered. The patient agreed with the plan and demonstrated an understanding of the instructions.   The patient was advised to call back or seek an in-person evaluation if the symptoms worsen or if the condition fails to improve as anticipated.  I provided 5 minutes  of non-face-to-face time during this encounter.   Irean Hong, NP

## 2022-04-11 ENCOUNTER — Inpatient Hospital Stay (HOSPITAL_BASED_OUTPATIENT_CLINIC_OR_DEPARTMENT_OTHER): Payer: Medicaid Other | Admitting: Oncology

## 2022-04-11 ENCOUNTER — Inpatient Hospital Stay: Payer: Medicaid Other

## 2022-04-11 ENCOUNTER — Ambulatory Visit
Admission: RE | Admit: 2022-04-11 | Discharge: 2022-04-11 | Disposition: A | Discharge status: Home / Self Care | Payer: Medicaid Other | Source: Ambulatory Visit | Attending: Radiation Oncology | Admitting: Radiation Oncology

## 2022-04-11 ENCOUNTER — Encounter: Payer: Self-pay | Admitting: Oncology

## 2022-04-11 ENCOUNTER — Other Ambulatory Visit: Payer: Self-pay

## 2022-04-11 VITALS — BP 110/70 | HR 86 | Temp 96.5°F | Ht 71.0 in | Wt 169.2 lb

## 2022-04-11 DIAGNOSIS — C099 Malignant neoplasm of tonsil, unspecified: Secondary | ICD-10-CM

## 2022-04-11 DIAGNOSIS — Z923 Personal history of irradiation: Secondary | ICD-10-CM | POA: Diagnosis not present

## 2022-04-11 DIAGNOSIS — C770 Secondary and unspecified malignant neoplasm of lymph nodes of head, face and neck: Secondary | ICD-10-CM | POA: Diagnosis not present

## 2022-04-11 DIAGNOSIS — K1230 Oral mucositis (ulcerative), unspecified: Secondary | ICD-10-CM | POA: Diagnosis not present

## 2022-04-11 DIAGNOSIS — Z79630 Long term (current) use of alkylating agent: Secondary | ICD-10-CM

## 2022-04-11 DIAGNOSIS — G893 Neoplasm related pain (acute) (chronic): Secondary | ICD-10-CM | POA: Diagnosis not present

## 2022-04-11 DIAGNOSIS — Z510 Encounter for antineoplastic radiation therapy: Secondary | ICD-10-CM | POA: Diagnosis not present

## 2022-04-11 DIAGNOSIS — F17210 Nicotine dependence, cigarettes, uncomplicated: Secondary | ICD-10-CM | POA: Diagnosis not present

## 2022-04-11 DIAGNOSIS — G9001 Carotid sinus syncope: Secondary | ICD-10-CM | POA: Diagnosis not present

## 2022-04-11 DIAGNOSIS — Z515 Encounter for palliative care: Secondary | ICD-10-CM | POA: Diagnosis not present

## 2022-04-11 DIAGNOSIS — Z20822 Contact with and (suspected) exposure to covid-19: Secondary | ICD-10-CM | POA: Diagnosis not present

## 2022-04-11 DIAGNOSIS — Z5111 Encounter for antineoplastic chemotherapy: Secondary | ICD-10-CM | POA: Diagnosis not present

## 2022-04-11 LAB — RAD ONC ARIA SESSION SUMMARY
Course Elapsed Days: 15
Plan Fractions Treated to Date: 12
Plan Prescribed Dose Per Fraction: 2 Gy
Plan Total Fractions Prescribed: 35
Plan Total Prescribed Dose: 70 Gy
Reference Point Dosage Given to Date: 24 Gy
Reference Point Session Dosage Given: 2 Gy
Session Number: 12

## 2022-04-11 LAB — COMPREHENSIVE METABOLIC PANEL
ALT: 24 U/L (ref 0–44)
AST: 18 U/L (ref 15–41)
Albumin: 3.9 g/dL (ref 3.5–5.0)
Alkaline Phosphatase: 70 U/L (ref 38–126)
Anion gap: 5 (ref 5–15)
BUN: 14 mg/dL (ref 6–20)
CO2: 26 mmol/L (ref 22–32)
Calcium: 8.8 mg/dL — ABNORMAL LOW (ref 8.9–10.3)
Chloride: 101 mmol/L (ref 98–111)
Creatinine, Ser: 0.92 mg/dL (ref 0.61–1.24)
GFR, Estimated: >60 mL/min (ref >60)
Glucose, Bld: 105 mg/dL — ABNORMAL HIGH (ref 70–99)
Potassium: 4.1 mmol/L (ref 3.5–5.1)
Sodium: 132 mmol/L — ABNORMAL LOW (ref 135–145)
Total Bilirubin: 0.5 mg/dL (ref 0.3–1.2)
Total Protein: 7.4 g/dL (ref 6.5–8.1)

## 2022-04-11 LAB — CBC WITH DIFFERENTIAL/PLATELET
Abs Immature Granulocytes: 0.01 10*3/uL (ref 0.00–0.07)
Basophils Absolute: 0.1 10*3/uL (ref 0.0–0.1)
Basophils Relative: 1 %
Eosinophils Absolute: 0.1 10*3/uL (ref 0.0–0.5)
Eosinophils Relative: 2 %
HCT: 36.8 % — ABNORMAL LOW (ref 39.0–52.0)
Hemoglobin: 12 g/dL — ABNORMAL LOW (ref 13.0–17.0)
Immature Granulocytes: 0 %
Lymphocytes Relative: 16 %
Lymphs Abs: 1 10*3/uL (ref 0.7–4.0)
MCH: 24.7 pg — ABNORMAL LOW (ref 26.0–34.0)
MCHC: 32.6 g/dL (ref 30.0–36.0)
MCV: 75.9 fL — ABNORMAL LOW (ref 80.0–100.0)
Monocytes Absolute: 0.5 10*3/uL (ref 0.1–1.0)
Monocytes Relative: 7 %
Neutro Abs: 4.6 10*3/uL (ref 1.7–7.7)
Neutrophils Relative %: 74 %
Platelets: 290 10*3/uL (ref 150–400)
RBC: 4.85 MIL/uL (ref 4.22–5.81)
RDW: 14.6 % (ref 11.5–15.5)
WBC: 6.3 10*3/uL (ref 4.0–10.5)
nRBC: 0 % (ref 0.0–0.2)

## 2022-04-11 MED ORDER — HEPARIN SOD (PORK) LOCK FLUSH 100 UNIT/ML IV SOLN
500.0000 [IU] | Freq: Once | INTRAVENOUS | Status: AC | PRN
  Administered 2022-04-11: 500 [IU]
  Filled 2022-04-11: qty 5

## 2022-04-11 MED ORDER — SUCRALFATE 1 GM/10ML PO SUSP: 1.0000 g | Freq: Three times a day (TID) | ORAL | 0 refills | Status: DC

## 2022-04-11 MED ORDER — SODIUM CHLORIDE 0.9% FLUSH
10.0000 mL | INTRAVENOUS | Status: AC | PRN
  Administered 2022-04-11: 10 mL
  Filled 2022-04-11: qty 10

## 2022-04-11 MED ORDER — SODIUM CHLORIDE 0.9 % IV SOLN
Freq: Once | INTRAVENOUS | Status: AC
  Filled 2022-04-11: qty 250

## 2022-04-11 MED ORDER — SODIUM CHLORIDE 0.9 % IV SOLN
230.0000 mg | Freq: Once | INTRAVENOUS | Status: AC
  Administered 2022-04-11: 230 mg via INTRAVENOUS
  Filled 2022-04-11: qty 23

## 2022-04-11 MED ORDER — SODIUM CHLORIDE 0.9 % IV SOLN
10.0000 mg | Freq: Once | INTRAVENOUS | Status: AC
  Administered 2022-04-11: 10 mg via INTRAVENOUS
  Filled 2022-04-11: qty 10

## 2022-04-11 MED ORDER — PALONOSETRON HCL INJECTION 0.25 MG/5ML
0.2500 mg | Freq: Once | INTRAVENOUS | Status: AC
  Administered 2022-04-11: 0.25 mg via INTRAVENOUS
  Filled 2022-04-11: qty 5

## 2022-04-11 NOTE — Assessment & Plan Note (Signed)
Encourage oral hydration  Improved.  No recurrent symptoms.

## 2022-04-11 NOTE — Progress Notes (Signed)
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 Recommend concurrent chemotherapy with radiation. Labs are reviewed and discussed with patient. Proceed with carboplatin AUC 2  Encounter for antineoplastic chemotherapy Treatment plan as listed above  Carotid sinus syndrome Encourage oral hydration  Improved.  No recurrent symptoms.  Mucositis Sucralfate liquid 4 times daily.   No orders of the defined types were placed in this encounter.  Follow up 1 week lab MD carboplatin  All questions were answered. The patient knows to call the clinic with any problems, questions or concerns. No barriers to learning was detected.  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 male presents to establish care for  I have reviewed his chart and materials related to his cancer extensively and collaborated history with the patient. 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 This likely reflects an enlarged  lymph node.  2 Abnormal soft tissue surrounding the mid-to-distal cervical right ICA with resultant mild vessel narrowing 3 Atherosclerotic plaque within the bilateral common carotid and cervical internal carotid arteries without hemodynamically significant stenosis. 4 Vertebral arteries patent within the neck. Mild-to-moderate atherosclerotic narrowing at the origin of the right vertebral artery. No more than mild atherosclerotic stenosis at the origin of the left vertebral artery. 5 Aortic Atherosclerosis and Emphysema 6. Cervical spondylosis   02/27/2022 Imaging   MRI neck soft tissue w wo Constellation of findings most compatible with a roughly 2.4 cm Right Tonsillar Carcinoma (series 12, image 11) with associated right level 2 and level 3 malignant nodal, ex-nodal disease (series 14, image 13). Recommend ENT consultation    02/27/2022 Imaging   02/27/2022 MRI brain without contrast showed  No acute intracranial abnormality. Abnormal right neck soft tissues. See dedicated Neck MRI today reported separately. 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 These are slow flow vascular malformations which are generally asymptomatic. [name] Other moderately advanced but nonspecific cerebral white matter signal changes.   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 Metastatic squamous cell carcinoma. Moderately differentiated with focal keratinization.  p16 negative.    02/28/2022 Cancer Staging   Staging form: Pharynx - P16 Negative Oropharynx, AJCC 8th Edition - Clinical stage from 02/28/2022: Stage IVA (cT2, cN2b, cM0, p16-) - Signed by [name], MD on 03/22/2022 Stage prefix: Initial diagnosis   03/19/2022 Imaging   PET scan showed Signs of RIGHT neck malignancy with metastatic involvement just below the skull base and at level II/III  also on the RIGHT.  No signs of contralateral nodal involvement.   03/28/2022 -  Chemotherapy   Patient is on Treatment Plan : HEAD/NECK Carboplatin (AUC 2) q7d      Patient initially presented emergency room due to intermittent syncope/near syncope episodes, diaphoretic, nauseated. Imaging findings are concerning for right tonsil mass with level 2/level 3 lymph node involvement which contributes to syncope/near syncope episodes -carotid sinus syndrome. 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  INTERVAL HISTORY Clarence Skinner is a 61 y.o. male who has above history reviewed by me today presents for follow up visit for management of stage IVa right tonsil squamous cell carcinoma Patient reports feeling well today.  No additional syncope events.   + Sore throat.  He has a prescription of sucralfate tablets not able to swallow. Denies fever, chills, sore throat, cough, nausea vomiting  MEDICAL HISTORY:  Past Medical History:  Diagnosis Date   COPD (chronic obstructive pulmonary disease) (HCC)    GERD (gastroesophageal reflux disease)    Gout    Hypertension    Multilevel degenerative disc disease    Pneumonia 11/29/2017   Sleep apnea    CPAP   Tachycardia    Wears dentures    partial upper and lower    SURGICAL HISTORY: Past Surgical History:  Procedure Laterality Date   ABCESS DRAINAGE  2009   tonsil   COLONOSCOPY WITH PROPOFOL N/A 02/11/2018   Procedure: COLONOSCOPY WITH PROPOFOL;  Surgeon: Toledo, Benay Pike, MD;  Location: [facility];  Service: Endoscopy;  Laterality: N/A;   ESOPHAGOGASTRODUODENOSCOPY (EGD) WITH PROPOFOL N/A 02/11/2018   Procedure: ESOPHAGOGASTRODUODENOSCOPY (EGD) WITH PROPOFOL;  Surgeon: Toledo, Benay Pike, MD;  Location: [facility];  Service: Endoscopy;  Laterality: N/A;   IR IMAGING GUIDED PORT INSERTION  04/09/2022   MASS EXCISION Right 08/14/2017   Procedure: EXCISION RIGHT TEMPORAL MASS SUBMENTAL MASS;  Surgeon: Carloyn Manner, MD;  Location: [place];  Service: ENT;  Laterality: Right;  sleep apnea   RADIOACTIVE SEED IMPLANT N/A 01/26/2019   Procedure: RADIOACTIVE SEED IMPLANT/BRACHYTHERAPY IMPLANT;  Surgeon: Billey Co, MD;  Location: [facility];  Service: Urology;  Laterality: N/A;   SEPTOPLASTY  2016   [place], [place]   VOLUME STUDY N/A 12/29/2018   Procedure: VOLUME STUDY;  Surgeon: Billey Co, MD;  Location: [facility];  Service: Urology;  Laterality: N/A;    SOCIAL HISTORY: Social History   Socioeconomic History   Marital status: Significant Other    Spouse name: Not on file   Number of children: Not on file   Years of education: Not on file   Highest education level: Not on file  Occupational History   Not on file  Tobacco Use   Smoking status: Every Day    Packs/day: 1.00    Years: 30.00    Total pack years: 30.00    Types: Cigarettes   Smokeless tobacco: Former    Types: Snuff, Chew    Quit date: 12/05/1984   Tobacco comments:    since age 18  Vaping Use   Vaping Use: Never used  Substance and Sexual Activity   Alcohol use: Yes    Alcohol/week: 12.0 standard drinks of alcohol    Types: 12 Cans of beer per week    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   Drug use: Not Currently   Sexual activity: Yes  Other Topics Concern   Not on file  Social History Narrative   Not  on file   Social Determinants of Health   Financial Resource Strain: High Risk (04/03/2022)   Overall Financial Resource Strain (CARDIA)    Difficulty of Paying Living Expenses: Hard  Food Insecurity: Food Insecurity Present (04/03/2022)   Hunger Vital Sign    Worried About Running Out of Food in the Last Year: Often true    Ran Out of Food in the Last Year: Often true  Transportation Needs: Unmet Transportation Needs (04/03/2022)   PRAPARE - Hydrologist (Medical): Yes    Lack of Transportation (Non-Medical): Yes  Physical Activity: Inactive (04/03/2022)   Exercise Vital Sign     Days of Exercise per Week: 0 days    Minutes of Exercise per Session: 0 min  Stress: Stress Concern Present (04/03/2022)   [place]    Feeling of Stress : Very much  Social Connections: Moderately Isolated (04/03/2022)   Social Connection and Isolation Panel [NHANES]    Frequency of Communication with Friends and Family: Never    Frequency of Social Gatherings with Friends and Family: Three times a week    Attends Religious Services: Never    Active Member of Clubs or Organizations: No    Attends Archivist Meetings: Not on file    Marital Status: Living with partner  Intimate Partner Violence: Not At Risk (04/03/2022)   Humiliation, Afraid, Rape, and Kick questionnaire    Fear of Current or Ex-Partner: No    Emotionally Abused: No    Physically Abused: No    Sexually Abused: No    FAMILY HISTORY: Family History  Problem Relation Age of Onset   Anxiety disorder Mother    Cancer Father    Hypertension Sister    Diabetes Sister    Asthma Sister    Other Brother        mva  at age 52   Gout Brother    Hypertension Brother    Prostate cancer Maternal Uncle    Prostate cancer Maternal Uncle     ALLERGIES:  is allergic to chocolate, eggs or egg-derived products, milk (cow), benadryl [diphenhydramine], and milk-related compounds.  MEDICATIONS:  Current Outpatient Medications  Medication Sig Dispense Refill   albuterol (PROVENTIL HFA;VENTOLIN HFA) 108 (90 Base) MCG/ACT inhaler Inhale into the lungs every 6 (six) hours as needed for wheezing or shortness of breath.     allopurinol (ZYLOPRIM) 300 MG tablet Take 300 mg by mouth daily.     amLODipine-benazepril (LOTREL) 10-40 MG capsule Take 1 capsule by mouth daily.     aspirin EC 81 MG tablet Take 1 tablet (81 mg total) by mouth daily. Swallow whole. 90 tablet 3   Azelastine HCl 137 MCG/SPRAY SOLN Place 1 spray into the nose 2 (two) times daily as needed.  30 mL 1   baclofen (LIORESAL) 10 MG tablet Take 10 mg by mouth 2 (two) times daily as needed.     benzonatate (TESSALON) 100 MG capsule Take 100 mg by mouth every 8 (eight) hours as needed.     ferrous sulfate 325 (65 FE) MG tablet Take 325 mg by mouth daily.  3   fluticasone (FLONASE) 50 MCG/ACT nasal spray Place into both nostrils daily as needed for allergies or rhinitis.     Fluticasone-Umeclidin-Vilant (TRELEGY ELLIPTA) 100-62.5-25 MCG/ACT AEPB Trelegy Ellipta 100-62.5-25 MCG/INH Inhalation Aerosol Powder Breath Activated QTY: 90 each Days: 90 Refills: 1  Written: 11/02/20 Patient Instructions: One puff once a  day     furosemide (LASIX) 20 MG tablet Take 20 mg by mouth daily.     gabapentin (NEURONTIN) 100 MG capsule Take 1 capsule (100 mg total) by mouth at bedtime. 30 capsule 1   lidocaine-prilocaine (EMLA) cream Apply 1 Application topically as directed. Apply small amount of cream to port a cath site 1-2 hours prior to port being accessed. 30 g 2   loratadine (CLARITIN) 10 MG tablet Take 10 mg by mouth daily.     meloxicam (MOBIC) 7.5 MG tablet Take 7.5 mg by mouth daily as needed for pain.     Oxycodone HCl 10 MG TABS Take 10 mg by mouth every 6 (six) hours as needed.     pantoprazole (PROTONIX) 40 MG tablet Take 40 mg by mouth daily.     prochlorperazine (COMPAZINE) 10 MG tablet Take 1 tablet (10 mg total) by mouth every 6 (six) hours as needed for nausea or vomiting. 30 tablet 1   rosuvastatin (CRESTOR) 20 MG tablet Take 1 tablet (20 mg total) by mouth daily. 30 tablet 5   senna (SENOKOT) 8.6 MG TABS tablet Take 1 tablet (8.6 mg total) by mouth daily. 120 tablet 0   sucralfate (CARAFATE) 1 g tablet Take 1 tablet (1 g total) by mouth 4 (four) times daily -  with meals and at bedtime. 30 tablet 2   sucralfate (CARAFATE) 1 GM/10ML suspension Take 10 mLs (1 g total) by mouth 4 (four) times daily -  with meals and at bedtime. 420 mL 0   SYMBICORT 80-4.5 MCG/ACT inhaler Inhale 1 puff into the  lungs 2 (two) times daily.     tadalafil (CIALIS) 20 MG tablet Take 1 tablet (20 mg total) by mouth daily. 30 tablet 11   Vitamin D, Ergocalciferol, (DRISDOL) 1.25 MG (50000 UNIT) CAPS capsule Take 50,000 Units by mouth once a week.     No current facility-administered medications for this visit.   Facility-Administered Medications Ordered in Other Visits  Medication Dose Route Frequency Provider Last Rate Last Admin   sodium chloride flush (NS) 0.9 % injection 10 mL  10 mL Intracatheter PRN [name], MD   10 mL at 04/11/22 1221    Review of Systems  Constitutional:  Positive for fatigue. Negative for chills and fever.  HENT:   Positive for hearing loss. Negative for voice change.   Eyes:  Negative for eye problems and icterus.  Respiratory:  Negative for chest tightness, cough and shortness of breath.   Cardiovascular:  Negative for chest pain and leg swelling.  Gastrointestinal:  Negative for abdominal distention and abdominal pain.  Endocrine: Negative for hot flashes.  Genitourinary:  Negative for difficulty urinating, dysuria and frequency.   Musculoskeletal:  Positive for arthralgias and neck pain.  Skin:  Negative for itching and rash.  Neurological:  Negative for light-headedness and numbness.  Hematological:  Negative for adenopathy. Does not bruise/bleed easily.  Psychiatric/Behavioral:  Negative for confusion.      PHYSICAL EXAMINATION: ECOG PERFORMANCE STATUS: 1 - Symptomatic but completely ambulatory  Vitals:   04/11/22 0942  BP: 110/70  Pulse: 86  Temp: (!) 96.5 F (35.8 C)  SpO2: 99%   Filed Weights   04/11/22 0942  Weight: 169 lb 3.2 oz (76.7 kg)    Physical Exam Constitutional:      General: He is not in acute distress.    Appearance: He is not diaphoretic.  HENT:     Head: Normocephalic and atraumatic.  Nose: Nose normal.     Mouth/Throat:     Pharynx: No oropharyngeal exudate.  Eyes:     General: No scleral icterus.    Pupils: Pupils are  equal, round, and reactive to light.  Cardiovascular:     Rate and Rhythm: Normal rate and regular rhythm.     Heart sounds: No murmur heard. Pulmonary:     Effort: Pulmonary effort is normal. No respiratory distress.     Breath sounds: No rales.  Chest:     Chest wall: No tenderness.  Abdominal:     General: There is no distension.     Palpations: Abdomen is soft.     Tenderness: There is no abdominal tenderness.  Musculoskeletal:        General: Normal range of motion.     Cervical back: Normal range of motion and neck supple.  Skin:    General: Skin is warm and dry.     Findings: No erythema.  Neurological:     Mental Status: He is alert and oriented to person, place, and time.     Cranial Nerves: No cranial nerve deficit.     Motor: No abnormal muscle tone.     Coordination: Coordination normal.  Psychiatric:        Mood and Affect: Mood and affect normal.      LABORATORY DATA:  I have reviewed the data as listed    Latest Ref Rng & Units 04/11/2022    9:08 AM 04/04/2022    9:08 AM 03/28/2022   10:00 AM  CBC  WBC 4.0 - 10.5 K/uL 6.3  8.2  8.4   Hemoglobin 13.0 - 17.0 g/dL 12.0  12.7  14.0   Hematocrit 39.0 - 52.0 % 36.8  39.4  43.4   Platelets 150 - 400 K/uL 290  359  404       Latest Ref Rng & Units 04/11/2022    9:08 AM 04/04/2022    9:08 AM 03/28/2022   10:00 AM  CMP  Glucose 70 - 99 mg/dL 105  95  114   BUN 6 - 20 mg/dL '14  12  12   '$ Creatinine 0.61 - 1.24 mg/dL 0.92  0.89  0.95   Sodium 135 - 145 mmol/L 132  134  136   Potassium 3.5 - 5.1 mmol/L 4.1  4.0  3.9   Chloride 98 - 111 mmol/L 101  103  102   CO2 22 - 32 mmol/L '26  25  26   '$ Calcium 8.9 - 10.3 mg/dL 8.8  9.1  9.6   Total Protein 6.5 - 8.1 g/dL 7.4  7.5  8.1   Total Bilirubin 0.3 - 1.2 mg/dL 0.5  0.4  0.3   Alkaline Phos 38 - 126 U/L 70  83  90   AST 15 - 41 U/L '18  20  14   '$ ALT 0 - 44 U/L '24  25  16       '$ RADIOGRAPHIC STUDIES: I have personally reviewed the radiological images as listed and  agreed with the findings in the report. IR IMAGING GUIDED PORT INSERTION  Result Date: 04/09/2022 INDICATION: 61 year old male with advanced tonsillar carcinoma requiring central venous access for chemotherapy administration. EXAM: IMPLANTED PORT A CATH PLACEMENT WITH ULTRASOUND AND FLUOROSCOPIC GUIDANCE COMPARISON:  None Available. MEDICATIONS: None. ANESTHESIA/SEDATION: Moderate (conscious) sedation was employed during this procedure. A total of Versed 2 mg and Fentanyl 100 mcg was administered intravenously. Moderate Sedation Time: 18 minutes. The patient's level  of consciousness and vital signs were monitored continuously by radiology nursing throughout the procedure under my direct supervision. CONTRAST:  None FLUOROSCOPY TIME:  Two mGy COMPLICATIONS: None immediate. PROCEDURE: The procedure, risks, benefits, and alternatives were explained to the patient. Questions regarding the procedure were encouraged and answered. The patient understands and consents to the procedure. The right neck and chest were prepped with chlorhexidine in a sterile fashion, and a sterile drape was applied covering the operative field. Maximum barrier sterile technique with sterile gowns and gloves were used for the procedure. A timeout was performed prior to the initiation of the procedure. Ultrasound was used to examine the jugular vein which was compressible and free of internal echoes. A skin marker was used to demarcate the planned venotomy and port pocket incision sites. Local anesthesia was provided to these sites and the subcutaneous tunnel track with 1% lidocaine with 1:100,000 epinephrine. A small incision was created at the jugular access site and blunt dissection was performed of the subcutaneous tissues. Under ultrasound guidance, the jugular vein was accessed with a 21 ga micropuncture needle and an 0.018" wire was inserted to the superior vena cava. Real-time ultrasound guidance was utilized for vascular access  including the acquisition of a permanent ultrasound image documenting patency of the accessed vessel. A 5 Fr micopuncture set was then used, through which a 0.035" Rosen wire was passed under fluoroscopic guidance into the inferior vena cava. An 8 Fr dilator was then placed over the wire. A subcutaneous port pocket was then created along the upper chest wall utilizing a combination of sharp and blunt dissection. The pocket was irrigated with sterile saline, packed with gauze, and observed for hemorrhage. A single lumen "ISP" sized power injectable port was chosen for placement. The 8 Fr catheter was tunneled from the port pocket site to the venotomy incision. The port was placed in the pocket. The external catheter was trimmed to appropriate length. The dilator was exchanged for an 8 Fr peel-away sheath under fluoroscopic guidance. The catheter was then placed through the sheath and the sheath was removed. Final catheter positioning was confirmed and documented with a fluoroscopic spot radiograph. The port was accessed with a Huber needle, aspirated, and flushed with heparinized saline. The deep dermal layer of the port pocket incision was closed with interrupted 3-0 Vicryl suture. The skin was opposed with a running subcuticular 4-0 Monocryl suture. Dermabond was then placed over the port pocket and neck incisions. The patient tolerated the procedure well without immediate post procedural complication. FINDINGS: After catheter placement, the tip lies within the superior cavoatrial junction. The catheter aspirates and flushes normally and is ready for immediate use. IMPRESSION: Successful placement of a power injectable Port-A-Cath via the right internal jugular vein. The catheter is ready for immediate use. Ruthann Cancer, MD [facility] Electronically Signed   By: Ruthann Cancer M.D.   On: 04/09/2022 12:44   NM PET Image Initial (PI) Skull Base To Thigh  (F-18 FDG)  Result Date: 03/19/2022 CLINICAL DATA:  Initial treatment strategy for head neck cancer. EXAM: NUCLEAR MEDICINE PET SKULL BASE TO THIGH TECHNIQUE: 8.62 mCi F-18 FDG was injected intravenously. Full-ring PET imaging was performed from the skull base to thigh after the radiotracer. CT data was obtained and used for attenuation correction and anatomic localization. Fasting blood glucose: 112 mg/dl COMPARISON:  Neck MRI from February 27, 2022 and CT angiography of the head and neck which was also performed in August of 2023.  FINDINGS: Mediastinal blood pool activity: SUV max 2.18 Liver activity: SUV max NA NECK: Known tonsillar mass with loss of normal soft tissue planes about the RIGHT oropharynx and parapharyngeal space (image 24/4) measuring approximately 2.1 cm with a maximum SUV of 18.98. At the RIGHT skull base is another area of increased metabolic activity corresponding nodal disease that is better illustrated on the previous neck MRI (image 16/4) just below the jugular foramen and associated with the RIGHT styloid process this measures approximately 16 mm with a maximum SUV of 17.55 (Image 30/4) large RIGHT neck mass with indistinct margins at level II/III measuring 3.5 x 2.6 cm with a maximum SUV of 17.99 No contralateral nodal disease. Incidental CT findings: Small cutaneous lesion suggested on image 26/4) 10 mm without signs of increased metabolic activity. CHEST: No hypermetabolic mediastinal or hilar nodes. No suspicious pulmonary nodules on the CT scan. Incidental CT findings: Aortic atherosclerosis with calcification. Normal heart size. No pericardial effusion. No adenopathy by size criteria in the chest. Signs of moderate pulmonary emphysema at the lung apices. No effusion, consolidation or airway abnormality. ABDOMEN/PELVIS: No abnormal hypermetabolic activity within the liver, pancreas, adrenal glands, or spleen. No hypermetabolic lymph nodes in the abdomen or pelvis. Incidental CT  findings: Liver with smooth contours. No acute process related to the gallbladder, pancreas, spleen, adrenal glands, kidneys, urinary bladder, stomach, small or large bowel. The appendix is normal. Aortic atherosclerosis. No aneurysmal dilation. No adenopathy in the abdomen or in the pelvis by size criteria. 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 SKELETON: No focal hypermetabolic activity to suggest skeletal metastasis. Facet arthropathy on the RIGHT at C6-C7 which is asymmetric to contralateral C6-C7 displays FDG uptake which is asymmetric but favored to be related to degenerative process. Maximum SUV 4.6. Incidental CT findings: None. IMPRESSION: Signs of RIGHT neck malignancy with metastatic involvement just below the skull base and at level II/III also on the RIGHT. No signs of contralateral nodal involvement. No signs of distant metastatic disease. Electronically Signed   By: Zetta Bills M.D.   On: 03/19/2022 15:45

## 2022-04-11 NOTE — Assessment & Plan Note (Signed)
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 Recommend concurrent chemotherapy with radiation. Labs are reviewed and discussed with patient. Proceed with carboplatin AUC 2   

## 2022-04-11 NOTE — Assessment & Plan Note (Signed)
Sucralfate liquid 4 times daily.

## 2022-04-11 NOTE — Assessment & Plan Note (Signed)
Treatment plan as listed above. 

## 2022-04-11 NOTE — Patient Instructions (Signed)
Carboplatin Injection What is this medication? CARBOPLATIN (KAR boe pla tin) treats some types of cancer. It works by slowing down the growth of cancer cells. This medicine may be used for other purposes; ask your health care provider or pharmacist if you have questions. COMMON BRAND NAME(S): Paraplatin What should I tell my care team before I take this medication? They need to know if you have any of these conditions: Blood disorders Hearing problems Kidney disease Recent or ongoing radiation therapy An unusual or allergic reaction to carboplatin, cisplatin, other medications, foods, dyes, or preservatives Pregnant or trying to get pregnant Breast-feeding How should I use this medication? This medication is injected into a vein. It is given by your care team in a hospital or clinic setting. Talk to your care team about the use of this medication in children. Special care may be needed. Overdosage: If you think you have taken too much of this medicine contact a poison control center or emergency room at once. NOTE: This medicine is only for you. Do not share this medicine with others. What if I miss a dose? Keep appointments for follow-up doses. It is important not to miss your dose. Call your care team if you are unable to keep an appointment. What may interact with this medication? Medications for seizures Some antibiotics, such as amikacin, gentamicin, neomycin, streptomycin, tobramycin Vaccines This list may not describe all possible interactions. Give your health care provider a list of all the medicines, herbs, non-prescription drugs, or dietary supplements you use. Also tell them if you smoke, drink alcohol, or use illegal drugs. Some items may interact with your medicine. What should I watch for while using this medication? Your condition will be monitored carefully while you are receiving this medication. You may need blood work while taking this medication. This medication may  make you feel generally unwell. This is not uncommon, as chemotherapy can affect healthy cells as well as cancer cells. Report any side effects. Continue your course of treatment even though you feel ill unless your care team tells you to stop. In some cases, you may be given additional medications to help with side effects. Follow all directions for their use. This medication may increase your risk of getting an infection. Call your care team for advice if you get a fever, chills, sore throat, or other symptoms of a cold or flu. Do not treat yourself. Try to avoid being around people who are sick. Avoid taking medications that contain aspirin, acetaminophen, ibuprofen, naproxen, or ketoprofen unless instructed by your care team. These medications may hide a fever. Be careful brushing or flossing your teeth or using a toothpick because you may get an infection or bleed more easily. If you have any dental work done, tell your dentist you are receiving this medication. Talk to your care team if you wish to become pregnant or think you might be pregnant. This medication can cause serious birth defects. Talk to your care team about effective forms of contraception. Do not breast-feed while taking this medication. What side effects may I notice from receiving this medication? Side effects that you should report to your care team as soon as possible: Allergic reactions--skin rash, itching, hives, swelling of the face, lips, tongue, or throat Infection--fever, chills, cough, sore throat, wounds that don't heal, pain or trouble when passing urine, general feeling of discomfort or being unwell Low red blood cell level--unusual weakness or fatigue, dizziness, headache, trouble breathing Pain, tingling, or numbness in the hands or  feet, muscle weakness, change in vision, confusion or trouble speaking, loss of balance or coordination, trouble walking, seizures Unusual bruising or bleeding Side effects that usually  do not require medical attention (report to your care team if they continue or are bothersome): Hair loss Nausea Unusual weakness or fatigue Vomiting This list may not describe all possible side effects. Call your doctor for medical advice about side effects. You may report side effects to FDA at 1-800-FDA-1088. Where should I keep my medication? This medication is given in a hospital or clinic. It will not be stored at home. NOTE: This sheet is a summary. It may not cover all possible information. If you have questions about this medicine, talk to your doctor, pharmacist, or health care provider.  2023 Elsevier/Gold Standard (2021-10-31 00:00:00)

## 2022-04-12 ENCOUNTER — Other Ambulatory Visit: Payer: Self-pay

## 2022-04-12 ENCOUNTER — Ambulatory Visit
Admission: RE | Admit: 2022-04-12 | Discharge: 2022-04-12 | Disposition: A | Discharge status: Home / Self Care | Payer: Medicaid Other | Source: Ambulatory Visit | Attending: Radiation Oncology | Admitting: Radiation Oncology

## 2022-04-12 DIAGNOSIS — F17210 Nicotine dependence, cigarettes, uncomplicated: Secondary | ICD-10-CM | POA: Diagnosis not present

## 2022-04-12 DIAGNOSIS — K1230 Oral mucositis (ulcerative), unspecified: Secondary | ICD-10-CM | POA: Diagnosis not present

## 2022-04-12 DIAGNOSIS — Z515 Encounter for palliative care: Secondary | ICD-10-CM | POA: Diagnosis not present

## 2022-04-12 DIAGNOSIS — Z510 Encounter for antineoplastic radiation therapy: Secondary | ICD-10-CM | POA: Diagnosis not present

## 2022-04-12 DIAGNOSIS — C099 Malignant neoplasm of tonsil, unspecified: Secondary | ICD-10-CM | POA: Diagnosis not present

## 2022-04-12 DIAGNOSIS — Z20822 Contact with and (suspected) exposure to covid-19: Secondary | ICD-10-CM | POA: Diagnosis not present

## 2022-04-12 DIAGNOSIS — G9001 Carotid sinus syncope: Secondary | ICD-10-CM | POA: Diagnosis not present

## 2022-04-12 DIAGNOSIS — C770 Secondary and unspecified malignant neoplasm of lymph nodes of head, face and neck: Secondary | ICD-10-CM | POA: Diagnosis not present

## 2022-04-12 DIAGNOSIS — G893 Neoplasm related pain (acute) (chronic): Secondary | ICD-10-CM | POA: Diagnosis not present

## 2022-04-12 DIAGNOSIS — Z5111 Encounter for antineoplastic chemotherapy: Secondary | ICD-10-CM | POA: Diagnosis not present

## 2022-04-12 LAB — RAD ONC ARIA SESSION SUMMARY
Course Elapsed Days: 16
Plan Fractions Treated to Date: 13
Plan Prescribed Dose Per Fraction: 2 Gy
Plan Total Fractions Prescribed: 35
Plan Total Prescribed Dose: 70 Gy
Reference Point Dosage Given to Date: 26 Gy
Reference Point Session Dosage Given: 2 Gy
Session Number: 13

## 2022-04-13 ENCOUNTER — Ambulatory Visit: Payer: Medicaid Other

## 2022-04-13 ENCOUNTER — Telehealth: Payer: Self-pay | Admitting: *Deleted

## 2022-04-13 DIAGNOSIS — C099 Malignant neoplasm of tonsil, unspecified: Secondary | ICD-10-CM

## 2022-04-13 DIAGNOSIS — R519 Headache, unspecified: Secondary | ICD-10-CM

## 2022-04-13 NOTE — Telephone Encounter (Signed)
I contacted patient to discuss coming to clinic for port lab/ smc / possible IV fluids to evaluate his headaches and constipation. Pt declines apt at this time as he just took laxatives to help with his constipation.    Explained to pt that provider Judson Roch, PA) would like to see him in the clinic to work up the headache. He may need additional imaging. 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 If headaches are severe, then further work up may be warranted. He then replied that "no one had ever told me this and this was the first that I am hearing that my tumor was close to the skull base."  Pt stated that he did not have a ride to his radiation apts on Monday and he is having trouble with his scheduled [employer] transportation. He is not sure he can keep the apts on Monday or Tuesday next week. He stated that unless he feels better from his constipation and use of laxatives, he does not want to come to the clinic today. I offered him multiple clinic apt times today and early next week. Pt declines an apt today in the clinic at this time. He is aware that if his headaches get worse after hours/weekend, then he will need to go to the ER for care.  Discussed with patient that he needs to continue to hydrate. He gave verbal understanding and will call our office back should he change his mind regarding the apt offered today.

## 2022-04-13 NOTE — Telephone Encounter (Signed)
Call from patient and s/o to answering service reporting that patient has a headache and severe constipation. I returned call and spoke with patient who reports that he has had a headache for the past week. He has been taking Tylenol for it and it works after 2 2/2 hours until he lays done then his head starts hurting in the back of head at the neck line and he has to take more medicine nd it will not ease off until he takes a nap. He also reports that he is having constipation but he ha=s not been using the senna and Miralax that he has on hand and will take them regularly unless he develops diarrhea.. Please advise regarding headache

## 2022-04-15 ENCOUNTER — Ambulatory Visit: Payer: Medicaid Other

## 2022-04-16 ENCOUNTER — Ambulatory Visit
Admission: RE | Admit: 2022-04-16 | Discharge: 2022-04-16 | Disposition: A | Discharge status: Home / Self Care | Payer: Medicaid Other | Source: Ambulatory Visit | Attending: Radiation Oncology | Admitting: Radiation Oncology

## 2022-04-16 ENCOUNTER — Telehealth: Payer: Self-pay | Admitting: *Deleted

## 2022-04-16 ENCOUNTER — Inpatient Hospital Stay: Payer: Medicaid Other

## 2022-04-16 ENCOUNTER — Ambulatory Visit: Payer: Medicaid Other

## 2022-04-16 ENCOUNTER — Other Ambulatory Visit: Payer: Self-pay

## 2022-04-16 ENCOUNTER — Inpatient Hospital Stay: Payer: Medicaid Other | Admitting: Nurse Practitioner

## 2022-04-16 DIAGNOSIS — Z7689 Persons encountering health services in other specified circumstances: Secondary | ICD-10-CM | POA: Diagnosis not present

## 2022-04-16 DIAGNOSIS — Z515 Encounter for palliative care: Secondary | ICD-10-CM | POA: Diagnosis not present

## 2022-04-16 DIAGNOSIS — K1230 Oral mucositis (ulcerative), unspecified: Secondary | ICD-10-CM | POA: Diagnosis not present

## 2022-04-16 DIAGNOSIS — Z5111 Encounter for antineoplastic chemotherapy: Secondary | ICD-10-CM | POA: Diagnosis not present

## 2022-04-16 DIAGNOSIS — Z20822 Contact with and (suspected) exposure to covid-19: Secondary | ICD-10-CM | POA: Diagnosis not present

## 2022-04-16 DIAGNOSIS — F17210 Nicotine dependence, cigarettes, uncomplicated: Secondary | ICD-10-CM | POA: Diagnosis not present

## 2022-04-16 DIAGNOSIS — Z510 Encounter for antineoplastic radiation therapy: Secondary | ICD-10-CM | POA: Diagnosis not present

## 2022-04-16 DIAGNOSIS — C099 Malignant neoplasm of tonsil, unspecified: Secondary | ICD-10-CM | POA: Diagnosis not present

## 2022-04-16 DIAGNOSIS — G9001 Carotid sinus syncope: Secondary | ICD-10-CM | POA: Diagnosis not present

## 2022-04-16 DIAGNOSIS — C770 Secondary and unspecified malignant neoplasm of lymph nodes of head, face and neck: Secondary | ICD-10-CM | POA: Diagnosis not present

## 2022-04-16 DIAGNOSIS — G893 Neoplasm related pain (acute) (chronic): Secondary | ICD-10-CM | POA: Diagnosis not present

## 2022-04-16 LAB — RAD ONC ARIA SESSION SUMMARY
Course Elapsed Days: 20
Plan Fractions Treated to Date: 14
Plan Prescribed Dose Per Fraction: 2 Gy
Plan Total Fractions Prescribed: 35
Plan Total Prescribed Dose: 70 Gy
Reference Point Dosage Given to Date: 28 Gy
Reference Point Session Dosage Given: 2 Gy
Session Number: 14

## 2022-04-16 NOTE — Telephone Encounter (Signed)
Patient contacted, but phone call goes directly to his voice mail. I attempted to coordinate his apts today-accommodate the timing of his smc apts with his radiation apts. Patient cnl his apts today for radiation due to lack of transportation.   I will add pt to smc schedule for tomorrow after his radiation apts- should patient desire to see smc provider.

## 2022-04-16 NOTE — Telephone Encounter (Signed)
If patient agreeable, we can accommodate him after his radiation apt as previously offered at last phone call.

## 2022-04-16 NOTE — Telephone Encounter (Signed)
Call from patient s/o this morning reporting that patient is having severe constipation . I returned call to patient who said that he had a large bowel movement Saturday and now he is have "the squirts" which is his norm after being constipated. He is not going frequently. He has not yet started taking the senna or Miralax, but said he is going to this week. I asked about his headache (reported 04/13/22) and he states that he is not having one this morning, but was yesterday and asked if he could have appointment for this. He states he was not feeling well Friday when appointment was offered. Please return his call regarding an appt

## 2022-04-17 ENCOUNTER — Inpatient Hospital Stay: Payer: Medicaid Other

## 2022-04-17 ENCOUNTER — Telehealth: Payer: Self-pay

## 2022-04-17 ENCOUNTER — Other Ambulatory Visit: Payer: Self-pay | Admitting: Oncology

## 2022-04-17 ENCOUNTER — Ambulatory Visit: Payer: Medicaid Other

## 2022-04-17 ENCOUNTER — Inpatient Hospital Stay (HOSPITAL_BASED_OUTPATIENT_CLINIC_OR_DEPARTMENT_OTHER): Payer: Medicaid Other | Admitting: Hospice and Palliative Medicine

## 2022-04-17 DIAGNOSIS — U071 COVID-19: Secondary | ICD-10-CM

## 2022-04-17 DIAGNOSIS — C099 Malignant neoplasm of tonsil, unspecified: Secondary | ICD-10-CM | POA: Diagnosis not present

## 2022-04-17 MED ORDER — MOLNUPIRAVIR EUA 200MG CAPSULE: 4.0000 | ORAL_CAPSULE | Freq: Two times a day (BID) | ORAL | 0 refills | Status: AC

## 2022-04-17 NOTE — Telephone Encounter (Signed)
Patient  is COVID-positive.   Per Dr. Tasia Catchings postpone tx for 2 weeks.   Appts for 9/27 and 10/4 have been cancelled. Will keep appt on 10/11. Pt informed and Rad onc also informed, they will update pt of any radiation tx appt updates.

## 2022-04-17 NOTE — Progress Notes (Signed)
Virtual Visit via Telephone Note  I connected with Clarence Skinner on 04/17/22 at 10:30 AM EDT by telephone and verified that I am speaking with the correct person using two identifiers.  Location: Patient: Home Provider: Clinic   I discussed the limitations, risks, security and privacy concerns of performing an evaluation and management service by telephone and the availability of in person appointments. I also discussed with the patient that there may be a patient responsible charge related to this service. The patient expressed understanding and agreed to proceed.   History of Present Illness: Clarence Skinner is a 61 y.o. 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  Patient is on concurrent chemoradiation.   Observations/Objective: Patient requested visit today for upper respiratory symptoms including cold chills, nasal congestion, headache, fatigue, and productive cough.  He denies objective fever but has felt hot at times.  He is unsure when the symptoms began.  He denies shortness of breath or wheezing.  Patient reports taking a rapid home COVID test yesterday, which was positive.  Patient also endorses recent constipation but reports that that has improved after initiation of senna/MiraLAX.  Assessment and Plan: COVID -patient is higher risk given active cancer/treatment.  We will start him on molnupiravir given potential drug interactions with Paxlovid.  Discussed symptomatic/supportive care with patient.  Also reviewed ER triggers.  Recommended that he obtain a home pulse oximeter.  Patient was scheduled for MD/treatment tomorrow but these appointments will have to be moved and message sent to Dr. Tasia Catchings and scheduler.  Follow Up Instructions: TBD   I discussed the assessment and treatment plan with the patient. The patient was provided an opportunity to ask questions and  all were answered. The patient agreed with the plan and demonstrated an understanding of the instructions.   The patient was advised to call back or seek an in-person evaluation if the symptoms worsen or if the condition fails to improve as anticipated.  I provided 10 minutes of non-face-to-face time during this encounter.   Irean Hong, NP

## 2022-04-17 NOTE — Telephone Encounter (Signed)
Pt has been contacted and agreeable to virtual visit. Pt already had an in-person visit scheduled this morning, therefore this visit was changed to virtual. Pt and wife aware.

## 2022-04-17 NOTE — Telephone Encounter (Signed)
Patient daughter called and stated that Clarence Skinner took a Home COVID test which came back positive. They would like to know what to do next.

## 2022-04-18 ENCOUNTER — Telehealth: Payer: Self-pay

## 2022-04-18 ENCOUNTER — Inpatient Hospital Stay: Payer: Medicaid Other

## 2022-04-18 ENCOUNTER — Ambulatory Visit: Payer: Medicaid Other

## 2022-04-18 ENCOUNTER — Inpatient Hospital Stay: Payer: Medicaid Other | Admitting: Oncology

## 2022-04-18 ENCOUNTER — Telehealth: Payer: Self-pay | Admitting: *Deleted

## 2022-04-18 NOTE — Telephone Encounter (Signed)
This letter has been written and faxed.

## 2022-04-18 NOTE — Telephone Encounter (Signed)
Patient called stating hat he needs a letter faxed to his lawyer that he is on [employer]arant[employer] as he is supposed to be in court Monday. Juanda Bond office # [phone], Fax [phone]

## 2022-04-18 NOTE — Telephone Encounter (Signed)
Medial excuse faxed per patient request.

## 2022-04-19 ENCOUNTER — Ambulatory Visit: Payer: Medicaid Other

## 2022-04-20 ENCOUNTER — Ambulatory Visit: Payer: Medicaid Other

## 2022-04-22 DIAGNOSIS — Z419 Encounter for procedure for purposes other than remedying health state, unspecified: Secondary | ICD-10-CM | POA: Diagnosis not present

## 2022-04-23 ENCOUNTER — Ambulatory Visit: Payer: Medicaid Other

## 2022-04-24 ENCOUNTER — Ambulatory Visit: Payer: Medicaid Other

## 2022-04-25 ENCOUNTER — Ambulatory Visit: Payer: Medicaid Other | Admitting: Oncology

## 2022-04-25 ENCOUNTER — Other Ambulatory Visit: Payer: Medicaid Other

## 2022-04-25 ENCOUNTER — Ambulatory Visit: Payer: Medicaid Other

## 2022-04-26 ENCOUNTER — Ambulatory Visit: Payer: Medicaid Other

## 2022-04-26 ENCOUNTER — Other Ambulatory Visit: Payer: Self-pay | Admitting: Oncology

## 2022-04-27 ENCOUNTER — Ambulatory Visit: Payer: Medicaid Other

## 2022-04-30 ENCOUNTER — Ambulatory Visit: Payer: Medicaid Other

## 2022-05-01 ENCOUNTER — Ambulatory Visit: Payer: Medicaid Other

## 2022-05-01 MED FILL — Dexamethasone Sodium Phosphate Inj 100 MG/10ML: INTRAMUSCULAR | Qty: 1 | Status: AC

## 2022-05-02 ENCOUNTER — Inpatient Hospital Stay: Payer: Medicaid Other

## 2022-05-02 ENCOUNTER — Encounter: Payer: Self-pay | Admitting: Oncology

## 2022-05-02 ENCOUNTER — Other Ambulatory Visit: Payer: Self-pay

## 2022-05-02 ENCOUNTER — Inpatient Hospital Stay (HOSPITAL_BASED_OUTPATIENT_CLINIC_OR_DEPARTMENT_OTHER): Payer: Medicaid Other | Admitting: Oncology

## 2022-05-02 ENCOUNTER — Ambulatory Visit
Admission: RE | Admit: 2022-05-02 | Discharge: 2022-05-02 | Disposition: A | Discharge status: Home / Self Care | Payer: Medicaid Other | Source: Ambulatory Visit | Attending: Radiation Oncology | Admitting: Radiation Oncology

## 2022-05-02 VITALS — BP 110/72 | HR 100 | Resp 18 | Wt 162.0 lb

## 2022-05-02 DIAGNOSIS — G9001 Carotid sinus syncope: Secondary | ICD-10-CM | POA: Insufficient documentation

## 2022-05-02 DIAGNOSIS — K1230 Oral mucositis (ulcerative), unspecified: Secondary | ICD-10-CM | POA: Diagnosis not present

## 2022-05-02 DIAGNOSIS — C099 Malignant neoplasm of tonsil, unspecified: Secondary | ICD-10-CM

## 2022-05-02 DIAGNOSIS — Z7689 Persons encountering health services in other specified circumstances: Secondary | ICD-10-CM | POA: Diagnosis not present

## 2022-05-02 DIAGNOSIS — C770 Secondary and unspecified malignant neoplasm of lymph nodes of head, face and neck: Secondary | ICD-10-CM | POA: Insufficient documentation

## 2022-05-02 DIAGNOSIS — Z5111 Encounter for antineoplastic chemotherapy: Secondary | ICD-10-CM | POA: Insufficient documentation

## 2022-05-02 DIAGNOSIS — Z79630 Long term (current) use of alkylating agent: Secondary | ICD-10-CM | POA: Diagnosis not present

## 2022-05-02 DIAGNOSIS — G893 Neoplasm related pain (acute) (chronic): Secondary | ICD-10-CM | POA: Diagnosis not present

## 2022-05-02 DIAGNOSIS — F17210 Nicotine dependence, cigarettes, uncomplicated: Secondary | ICD-10-CM | POA: Insufficient documentation

## 2022-05-02 DIAGNOSIS — Z20822 Contact with and (suspected) exposure to covid-19: Secondary | ICD-10-CM | POA: Diagnosis not present

## 2022-05-02 DIAGNOSIS — I10 Essential (primary) hypertension: Secondary | ICD-10-CM | POA: Insufficient documentation

## 2022-05-02 DIAGNOSIS — Z510 Encounter for antineoplastic radiation therapy: Secondary | ICD-10-CM | POA: Diagnosis not present

## 2022-05-02 DIAGNOSIS — Z923 Personal history of irradiation: Secondary | ICD-10-CM | POA: Diagnosis not present

## 2022-05-02 DIAGNOSIS — Z515 Encounter for palliative care: Secondary | ICD-10-CM | POA: Diagnosis not present

## 2022-05-02 LAB — RAD ONC ARIA SESSION SUMMARY
Course Elapsed Days: 36
Plan Fractions Treated to Date: 15
Plan Prescribed Dose Per Fraction: 2 Gy
Plan Total Fractions Prescribed: 35
Plan Total Prescribed Dose: 70 Gy
Reference Point Dosage Given to Date: 30 Gy
Reference Point Session Dosage Given: 2 Gy
Session Number: 15

## 2022-05-02 LAB — COMPREHENSIVE METABOLIC PANEL
ALT: 18 U/L (ref 0–44)
AST: 16 U/L (ref 15–41)
Albumin: 3.9 g/dL (ref 3.5–5.0)
Alkaline Phosphatase: 68 U/L (ref 38–126)
Anion gap: 6 (ref 5–15)
BUN: 9 mg/dL (ref 6–20)
CO2: 27 mmol/L (ref 22–32)
Calcium: 9 mg/dL (ref 8.9–10.3)
Chloride: 105 mmol/L (ref 98–111)
Creatinine, Ser: 0.81 mg/dL (ref 0.61–1.24)
GFR, Estimated: >60 mL/min (ref >60)
Glucose, Bld: 100 mg/dL — ABNORMAL HIGH (ref 70–99)
Potassium: 3.3 mmol/L — ABNORMAL LOW (ref 3.5–5.1)
Sodium: 138 mmol/L (ref 135–145)
Total Bilirubin: 0.4 mg/dL (ref 0.3–1.2)
Total Protein: 7.8 g/dL (ref 6.5–8.1)

## 2022-05-02 LAB — CBC WITH DIFFERENTIAL/PLATELET
Abs Immature Granulocytes: 0.02 10*3/uL (ref 0.00–0.07)
Basophils Absolute: 0.1 10*3/uL (ref 0.0–0.1)
Basophils Relative: 1 %
Eosinophils Absolute: 0.1 10*3/uL (ref 0.0–0.5)
Eosinophils Relative: 2 %
HCT: 36.6 % — ABNORMAL LOW (ref 39.0–52.0)
Hemoglobin: 11.9 g/dL — ABNORMAL LOW (ref 13.0–17.0)
Immature Granulocytes: 0 %
Lymphocytes Relative: 23 %
Lymphs Abs: 1.1 10*3/uL (ref 0.7–4.0)
MCH: 24.5 pg — ABNORMAL LOW (ref 26.0–34.0)
MCHC: 32.5 g/dL (ref 30.0–36.0)
MCV: 75.3 fL — ABNORMAL LOW (ref 80.0–100.0)
Monocytes Absolute: 0.4 10*3/uL (ref 0.1–1.0)
Monocytes Relative: 9 %
Neutro Abs: 3 10*3/uL (ref 1.7–7.7)
Neutrophils Relative %: 65 %
Platelets: 270 10*3/uL (ref 150–400)
RBC: 4.86 MIL/uL (ref 4.22–5.81)
RDW: 15.4 % (ref 11.5–15.5)
WBC: 4.7 10*3/uL (ref 4.0–10.5)
nRBC: 0 % (ref 0.0–0.2)

## 2022-05-02 MED ORDER — HEPARIN SOD (PORK) LOCK FLUSH 100 UNIT/ML IV SOLN
INTRAVENOUS | Status: AC
  Administered 2022-05-02: 500 [IU]
  Filled 2022-05-02: qty 5

## 2022-05-02 MED ORDER — POTASSIUM CHLORIDE CRYS ER 20 MEQ PO TBCR: 20.0000 meq | EXTENDED_RELEASE_TABLET | Freq: Every day | ORAL | 0 refills | Status: DC

## 2022-05-02 MED ORDER — SODIUM CHLORIDE 0.9 % IV SOLN
Freq: Once | INTRAVENOUS | Status: AC
  Filled 2022-05-02: qty 250

## 2022-05-02 MED ORDER — BENZONATATE 100 MG PO CAPS: 100.0000 mg | ORAL_CAPSULE | Freq: Three times a day (TID) | ORAL | 0 refills | Status: DC | PRN

## 2022-05-02 MED ORDER — SODIUM CHLORIDE 0.9 % IV SOLN
10.0000 mg | Freq: Once | INTRAVENOUS | Status: AC
  Administered 2022-05-02: 10 mg via INTRAVENOUS
  Filled 2022-05-02: qty 10

## 2022-05-02 MED ORDER — PALONOSETRON HCL INJECTION 0.25 MG/5ML
0.2500 mg | Freq: Once | INTRAVENOUS | Status: AC
  Administered 2022-05-02: 0.25 mg via INTRAVENOUS
  Filled 2022-05-02: qty 5

## 2022-05-02 MED ORDER — SODIUM CHLORIDE 0.9 % IV SOLN
230.0000 mg | Freq: Once | INTRAVENOUS | Status: AC
  Administered 2022-05-02: 230 mg via INTRAVENOUS
  Filled 2022-05-02: qty 23

## 2022-05-02 MED ORDER — HEPARIN SOD (PORK) LOCK FLUSH 100 UNIT/ML IV SOLN
500.0000 [IU] | Freq: Once | INTRAVENOUS | Status: AC | PRN
  Filled 2022-05-02: qty 5

## 2022-05-02 NOTE — Patient Instructions (Signed)
[facility]  Discharge Instructions: Thank you for choosing [facility] to provide your oncology and hematology care.  If you have a lab appointment with the Cancer Center, please go directly to the Cancer Center and check in at the registration area.  Wear comfortable clothing and clothing appropriate for easy access to any Portacath or PICC line.   We strive to give you quality time with your provider. You may need to reschedule your appointment if you arrive late (15 or more minutes).  Arriving late affects you and other patients whose appointments are after yours.  Also, if you miss three or more appointments without notifying the office, you may be dismissed from the clinic at the provider's discretion.      For prescription refill requests, have your pharmacy contact our office and allow 72 hours for refills to be completed.    Today you received the following chemotherapy and/or immunotherapy agents: Carboplatin      To help prevent nausea and vomiting after your treatment, we encourage you to take your nausea medication as directed.  BELOW ARE SYMPTOMS THAT SHOULD BE REPORTED IMMEDIATELY: *FEVER GREATER THAN 100.4 F (38 C) OR HIGHER *CHILLS OR SWEATING *NAUSEA AND VOMITING THAT IS NOT CONTROLLED WITH YOUR NAUSEA MEDICATION *UNUSUAL SHORTNESS OF BREATH *UNUSUAL BRUISING OR BLEEDING *URINARY PROBLEMS (pain or burning when urinating, or frequent urination) *BOWEL PROBLEMS (unusual diarrhea, constipation, pain near the anus) TENDERNESS IN MOUTH AND THROAT WITH OR WITHOUT PRESENCE OF ULCERS (sore throat, sores in mouth, or a toothache) UNUSUAL RASH, SWELLING OR PAIN  UNUSUAL VAGINAL DISCHARGE OR ITCHING   Items with * indicate a potential emergency and should be followed up as soon as possible or go to the Emergency Department if any problems should occur.  Please show the CHEMOTHERAPY ALERT CARD or IMMUNOTHERAPY ALERT CARD at check-in  to the Emergency Department and triage nurse.  Should you have questions after your visit or need to cancel or reschedule your appointment, please contact [facility]  [phone] and follow the prompts.  Office hours are 8:00 a.m. to 4:30 p.m. Monday - Friday. Please note that voicemails left after 4:00 p.m. may not be returned until the following business day.  We are closed weekends and major holidays. You have access to a nurse at all times for urgent questions. Please call the main number to the clinic [phone] and follow the prompts.  For any non-urgent questions, you may also contact your provider using MyChart. We now offer e-Visits for anyone 18 and older to request care online for non-urgent symptoms. For details visit [email or web address].   Also download the MyChart app! Go to the app store, search "MyChart", open the app, select [facility], and log in with your MyChart username and password.  Masks are optional in the cancer centers. If you would like for your care team to wear a mask while they are taking care of you, please let them know. For doctor visits, patients may have with them one support person who is at least 59 years old. At this time, visitors are not allowed in the infusion area.   

## 2022-05-02 NOTE — Assessment & Plan Note (Signed)
Treatment plan as listed above. 

## 2022-05-02 NOTE — Assessment & Plan Note (Signed)
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 Recommend concurrent chemotherapy with radiation. Labs are reviewed and discussed with patient. Proceed with carboplatin AUC 2   

## 2022-05-02 NOTE — Progress Notes (Signed)
Patient states that he has lost his taste and hard to swallow but not painful. Painful on neck behind right ear and ear is tender. Hard to sleep at night, feels restless and agitated. Cough that causes gagging. Needs refill on cough meds.

## 2022-05-02 NOTE — Progress Notes (Signed)
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 Recommend concurrent chemotherapy with radiation. Labs are reviewed and discussed with patient. Proceed with carboplatin AUC 2  Carotid sinus syndrome Encourage oral hydration  Improved.  No recurrent symptoms.  Encounter for antineoplastic chemotherapy Treatment plan as listed above   Orders Placed This Encounter  Procedures   Comprehensive metabolic panel    Standing Status:   Future    Standing Expiration Date:   05/30/2023   CBC with Differential    Standing Status:   Future    Standing Expiration Date:   05/31/2023    Follow up 1 week lab MD carboplatin  All questions were answered. The patient knows to call the clinic with any problems, questions or concerns. No barriers to learning was detected.  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 male presents to establish care for  I have reviewed his chart and materials related to his cancer extensively and collaborated history with the patient. 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 This likely reflects an enlarged lymph node.  2 Abnormal soft tissue surrounding the mid-to-distal cervical right ICA with resultant mild vessel narrowing 3 Atherosclerotic plaque within the bilateral common carotid and cervical internal carotid arteries without hemodynamically significant stenosis. 4 Vertebral arteries patent within the neck. Mild-to-moderate atherosclerotic narrowing at the origin of the right vertebral artery. No more than mild atherosclerotic stenosis at the origin of the left vertebral artery. 5 Aortic Atherosclerosis and Emphysema 6. Cervical spondylosis   02/27/2022 Imaging   MRI neck soft tissue w wo Constellation of findings most compatible with a roughly 2.4 cm Right Tonsillar Carcinoma (series 12, image 11) with associated right level 2 and level 3 malignant nodal, ex-nodal disease (series 14, image 13). Recommend ENT consultation    02/27/2022 Imaging   02/27/2022 MRI brain without contrast showed  No acute intracranial abnormality. Abnormal right neck soft tissues. See dedicated Neck MRI today reported separately. 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 These are slow flow vascular malformations which are generally asymptomatic. [name] Other moderately advanced but nonspecific cerebral white matter signal changes.   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 Metastatic squamous cell carcinoma. Moderately differentiated with focal keratinization.  p16 negative.    02/28/2022 Cancer Staging   Staging form: Pharynx - P16 Negative Oropharynx, AJCC 8th Edition - Clinical stage from 02/28/2022: Stage IVA (cT2, cN2b, cM0, p16-) - Signed by Tasia Catchings,  Talbert Cage, MD on 03/22/2022 Stage prefix: Initial diagnosis   03/19/2022 Imaging    PET scan showed Signs of RIGHT neck malignancy with metastatic involvement just below the skull base and at level II/III also on the RIGHT. No signs of contralateral nodal involvement.   03/28/2022 -  Chemotherapy   Patient is on Treatment Plan : HEAD/NECK Carboplatin (AUC 2) q7d      Patient initially presented emergency room due to intermittent syncope/near syncope episodes, diaphoretic, nauseated. Imaging findings are concerning for right tonsil mass with level 2/level 3 lymph node involvement which contributes to syncope/near syncope episodes -carotid sinus syndrome. 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  INTERVAL HISTORY Clarence Skinner is a 61 y.o. male who has above history reviewed by me today presents for follow up visit for management of stage IVa right tonsil squamous cell carcinoma Patient reports feeling well today.  No additional syncope events.   During interval patient has had a COVID-19 infection.  Chemotherapy radiation was held for 2 weeks.  Today he reports most of his symptoms have improved.  Still have some intermittent cough.  Some sleep disturbance due to cough. Decrease of taste, hard to swallow, no dysphagia.+ Sore throat, patient has been utilizing sucralfate solution for mucositis.  MEDICAL HISTORY:  Past Medical History:  Diagnosis Date   COPD (chronic obstructive pulmonary disease) (HCC)    GERD (gastroesophageal reflux disease)    Gout    Hypertension    Multilevel degenerative disc disease    Pneumonia 11/29/2017   Sleep apnea    CPAP   Tachycardia    Wears dentures    partial upper and lower    SURGICAL HISTORY: Past Surgical History:  Procedure Laterality Date   ABCESS DRAINAGE  2009   tonsil   COLONOSCOPY WITH PROPOFOL N/A 02/11/2018   Procedure: COLONOSCOPY WITH PROPOFOL;  Surgeon: Toledo, Benay Pike, MD;  Location: [facility];  Service: Endoscopy;  Laterality: N/A;   ESOPHAGOGASTRODUODENOSCOPY (EGD) WITH PROPOFOL N/A  02/11/2018   Procedure: ESOPHAGOGASTRODUODENOSCOPY (EGD) WITH PROPOFOL;  Surgeon: Toledo, Benay Pike, MD;  Location: [facility];  Service: Endoscopy;  Laterality: N/A;   IR IMAGING GUIDED PORT INSERTION  04/09/2022   MASS EXCISION Right 08/14/2017   Procedure: EXCISION RIGHT TEMPORAL MASS SUBMENTAL MASS;  Surgeon: Carloyn Manner, MD;  Location: [place];  Service: ENT;  Laterality: Right;  sleep apnea   RADIOACTIVE SEED IMPLANT N/A 01/26/2019   Procedure: RADIOACTIVE SEED IMPLANT/BRACHYTHERAPY IMPLANT;  Surgeon: Billey Co, MD;  Location: [facility];  Service: Urology;  Laterality: N/A;   SEPTOPLASTY  2016   [place], [place]   VOLUME STUDY N/A 12/29/2018   Procedure: VOLUME STUDY;  Surgeon: Billey Co, MD;  Location: [facility];  Service: Urology;  Laterality: N/A;    SOCIAL HISTORY: Social History   Socioeconomic History   Marital status: Significant Other    Spouse name: Not on file   Number of children: Not on file   Years of education: Not on file   Highest education level: Not on file  Occupational History   Not on file  Tobacco Use   Smoking status: Every Day    Packs/day: 1.00    Years: 30.00    Total pack years: 30.00    Types: Cigarettes   Smokeless tobacco: Former    Types: Snuff, Chew    Quit date: 12/05/1984   Tobacco comments:    since age 45  Vaping Use   Vaping Use: Never used  Substance and Sexual Activity   Alcohol use: Yes    Alcohol/week: 12.0 standard drinks of alcohol    Types: 12 Cans of beer per week    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   Drug use: Not Currently   Sexual activity: Yes  Other Topics Concern   Not on file  Social History Narrative   Not on file   Social Determinants of Health   Financial Resource Strain: High Risk (04/03/2022)   Overall Financial Resource Strain (CARDIA)    Difficulty of Paying Living Expenses: Hard  Food Insecurity: Food Insecurity Present (04/03/2022)   Hunger Vital Sign     Worried About Running Out of Food in the Last Year: Often true    Ran Out of Food in the Last Year: Often true  Transportation Needs: Unmet Transportation Needs (04/03/2022)   PRAPARE - Hydrologist (Medical): Yes    Lack of Transportation (Non-Medical): Yes  Physical Activity: Inactive (04/03/2022)   Exercise Vital Sign    Days of Exercise per Week: 0 days    Minutes of Exercise per Session: 0 min  Stress: Stress Concern Present (04/03/2022)   [place]    Feeling of Stress : Very much  Social Connections: Moderately Isolated (04/03/2022)   Social Connection and Isolation Panel [NHANES]    Frequency of Communication with Friends and Family: Never    Frequency of Social Gatherings with Friends and Family: Three times a week    Attends Religious Services: Never    Active Member of Clubs or Organizations: No    Attends Archivist Meetings: Not on file    Marital Status: Living with partner  Intimate Partner Violence: Not At Risk (04/03/2022)   Humiliation, Afraid, Rape, and Kick questionnaire    Fear of Current or Ex-Partner: No    Emotionally Abused: No    Physically Abused: No    Sexually Abused: No    FAMILY HISTORY: Family History  Problem Relation Age of Onset   Anxiety disorder Mother    Cancer Father    Hypertension Sister    Diabetes Sister    Asthma Sister    Other Brother        mva  at age 95   Gout Brother    Hypertension Brother    Prostate cancer Maternal Uncle    Prostate cancer Maternal Uncle     ALLERGIES:  is allergic to chocolate, eggs or egg-derived products, milk (cow), benadryl [diphenhydramine], and milk-related compounds.  MEDICATIONS:  Current Outpatient Medications  Medication Sig Dispense Refill   albuterol (PROVENTIL HFA;VENTOLIN HFA) 108 (90 Base) MCG/ACT inhaler Inhale into the lungs every 6 (six) hours as needed for wheezing or shortness  of breath.     allopurinol (ZYLOPRIM) 300 MG tablet Take 300 mg by mouth daily.     amLODipine-benazepril (LOTREL) 10-40 MG capsule Take 1 capsule by mouth daily.     Azelastine HCl 137 MCG/SPRAY SOLN PLACE 1 SPRAY INTO THE NOSE 2 (TWO) TIMES DAILY AS NEEDED. 30 mL 1   baclofen (LIORESAL) 10 MG tablet Take 10 mg by mouth 2 (two) times daily as needed.     benzonatate (TESSALON) 100 MG capsule Take 100 mg by mouth every 8 (eight) hours as needed.     fluticasone (FLONASE) 50 MCG/ACT nasal spray Place into both nostrils daily as needed for allergies or rhinitis.     Fluticasone-Umeclidin-Vilant (TRELEGY  ELLIPTA) 100-62.5-25 MCG/ACT AEPB Trelegy Ellipta 100-62.5-25 MCG/INH Inhalation Aerosol Powder Breath Activated QTY: 90 each Days: 90 Refills: 1  Written: 11/02/20 Patient Instructions: One puff once a day     lidocaine-prilocaine (EMLA) cream Apply 1 Application topically as directed. Apply small amount of cream to port a cath site 1-2 hours prior to port being accessed. 30 g 2   Oxycodone HCl 10 MG TABS Take 10 mg by mouth every 6 (six) hours as needed.     pantoprazole (PROTONIX) 40 MG tablet Take 40 mg by mouth daily.     potassium chloride SA (KLOR-CON M) 20 MEQ tablet Take 1 tablet (20 mEq total) by mouth daily. 3 tablet 0   rosuvastatin (CRESTOR) 20 MG tablet Take 1 tablet (20 mg total) by mouth daily. 30 tablet 5   senna (SENOKOT) 8.6 MG TABS tablet Take 1 tablet (8.6 mg total) by mouth daily. 120 tablet 0   SYMBICORT 80-4.5 MCG/ACT inhaler Inhale 1 puff into the lungs 2 (two) times daily.     Vitamin D, Ergocalciferol, (DRISDOL) 1.25 MG (50000 UNIT) CAPS capsule Take 50,000 Units by mouth once a week.     furosemide (LASIX) 20 MG tablet Take 20 mg by mouth daily. (Patient not taking: Reported on 05/02/2022)     gabapentin (NEURONTIN) 100 MG capsule Take 1 capsule (100 mg total) by mouth at bedtime. (Patient not taking: Reported on 05/02/2022) 30 capsule 1   loratadine (CLARITIN) 10 MG tablet  Take 10 mg by mouth daily. (Patient not taking: Reported on 05/02/2022)     meloxicam (MOBIC) 7.5 MG tablet Take 7.5 mg by mouth daily as needed for pain. (Patient not taking: Reported on 05/02/2022)     prochlorperazine (COMPAZINE) 10 MG tablet Take 1 tablet (10 mg total) by mouth every 6 (six) hours as needed for nausea or vomiting. (Patient not taking: Reported on 05/02/2022) 30 tablet 1   sucralfate (CARAFATE) 1 g tablet Take 1 tablet (1 g total) by mouth 4 (four) times daily -  with meals and at bedtime. (Patient not taking: Reported on 05/02/2022) 30 tablet 2   sucralfate (CARAFATE) 1 GM/10ML suspension Take 10 mLs (1 g total) by mouth 4 (four) times daily -  with meals and at bedtime. (Patient not taking: Reported on 05/02/2022) 420 mL 0   tadalafil (CIALIS) 20 MG tablet Take 1 tablet (20 mg total) by mouth daily. (Patient not taking: Reported on 05/02/2022) 30 tablet 11   No current facility-administered medications for this visit.   Facility-Administered Medications Ordered in Other Visits  Medication Dose Route Frequency Provider Last Rate Last Admin   sodium chloride flush (NS) 0.9 % injection 10 mL  10 mL Intracatheter PRN [name], MD   10 mL at 04/11/22 1221    Review of Systems  Constitutional:  Positive for fatigue. Negative for chills and fever.  HENT:   Positive for hearing loss. Negative for voice change.   Eyes:  Negative for eye problems and icterus.  Respiratory:  Negative for chest tightness, cough and shortness of breath.   Cardiovascular:  Negative for chest pain and leg swelling.  Gastrointestinal:  Negative for abdominal distention and abdominal pain.  Endocrine: Negative for hot flashes.  Genitourinary:  Negative for difficulty urinating, dysuria and frequency.   Musculoskeletal:  Positive for arthralgias and neck pain.  Skin:  Negative for itching and rash.  Neurological:  Negative for light-headedness and numbness.  Hematological:  Negative for adenopathy. Does  not bruise/bleed easily.  Psychiatric/Behavioral:  Negative for confusion.      PHYSICAL EXAMINATION: ECOG PERFORMANCE STATUS: 1 - Symptomatic but completely ambulatory  Vitals:   05/02/22 1026 05/02/22 1028  BP:  110/72  Pulse: 100   Resp: 18   SpO2: 98%    Filed Weights   05/02/22 1012  Weight: 162 lb (73.5 kg)    Physical Exam Constitutional:      General: He is not in acute distress.    Appearance: He is not diaphoretic.  HENT:     Head: Normocephalic and atraumatic.     Nose: Nose normal.     Mouth/Throat:     Pharynx: No oropharyngeal exudate.  Eyes:     General: No scleral icterus.    Pupils: Pupils are equal, round, and reactive to light.  Cardiovascular:     Rate and Rhythm: Normal rate and regular rhythm.     Heart sounds: No murmur heard. Pulmonary:     Effort: Pulmonary effort is normal. No respiratory distress.     Breath sounds: No rales.  Chest:     Chest wall: No tenderness.  Abdominal:     General: There is no distension.     Palpations: Abdomen is soft.     Tenderness: There is no abdominal tenderness.  Musculoskeletal:        General: Normal range of motion.     Cervical back: Normal range of motion and neck supple.  Skin:    General: Skin is warm and dry.     Findings: No erythema.  Neurological:     Mental Status: He is alert and oriented to person, place, and time.     Cranial Nerves: No cranial nerve deficit.     Motor: No abnormal muscle tone.     Coordination: Coordination normal.  Psychiatric:        Mood and Affect: Mood and affect normal.      LABORATORY DATA:  I have reviewed the data as listed    Latest Ref Rng & Units 05/02/2022    9:05 AM 04/11/2022    9:08 AM 04/04/2022    9:08 AM  CBC  WBC 4.0 - 10.5 K/uL 4.7  6.3  8.2   Hemoglobin 13.0 - 17.0 g/dL 11.9  12.0  12.7   Hematocrit 39.0 - 52.0 % 36.6  36.8  39.4   Platelets 150 - 400 K/uL 270  290  359       Latest Ref Rng & Units 05/02/2022    9:05 AM 04/11/2022     9:08 AM 04/04/2022    9:08 AM  CMP  Glucose 70 - 99 mg/dL 100  105  95   BUN 6 - 20 mg/dL '9  14  12   '$ Creatinine 0.61 - 1.24 mg/dL 0.81  0.92  0.89   Sodium 135 - 145 mmol/L 138  132  134   Potassium 3.5 - 5.1 mmol/L 3.3  4.1  4.0   Chloride 98 - 111 mmol/L 105  101  103   CO2 22 - 32 mmol/L '27  26  25   '$ Calcium 8.9 - 10.3 mg/dL 9.0  8.8  9.1   Total Protein 6.5 - 8.1 g/dL 7.8  7.4  7.5   Total Bilirubin 0.3 - 1.2 mg/dL 0.4  0.5  0.4   Alkaline Phos 38 - 126 U/L 68  70  83   AST 15 - 41 U/L '16  18  20   '$ ALT 0 - 44 U/L  $'18  24  25       'F$ RADIOGRAPHIC STUDIES: I have personally reviewed the radiological images as listed and agreed with the findings in the report. IR IMAGING GUIDED PORT INSERTION  Result Date: 04/09/2022 INDICATION: 61 year old male with advanced tonsillar carcinoma requiring central venous access for chemotherapy administration. EXAM: IMPLANTED PORT A CATH PLACEMENT WITH ULTRASOUND AND FLUOROSCOPIC GUIDANCE COMPARISON:  None Available. MEDICATIONS: None. ANESTHESIA/SEDATION: Moderate (conscious) sedation was employed during this procedure. A total of Versed 2 mg and Fentanyl 100 mcg was administered intravenously. Moderate Sedation Time: 18 minutes. The patient's level of consciousness and vital signs were monitored continuously by radiology nursing throughout the procedure under my direct supervision. CONTRAST:  None FLUOROSCOPY TIME:  Two mGy COMPLICATIONS: None immediate. PROCEDURE: The procedure, risks, benefits, and alternatives were explained to the patient. Questions regarding the procedure were encouraged and answered. The patient understands and consents to the procedure. The right neck and chest were prepped with chlorhexidine in a sterile fashion, and a sterile drape was applied covering the operative field. Maximum barrier sterile technique with sterile gowns and gloves were used for the procedure. A timeout was performed prior to the initiation of the procedure.  Ultrasound was used to examine the jugular vein which was compressible and free of internal echoes. A skin marker was used to demarcate the planned venotomy and port pocket incision sites. Local anesthesia was provided to these sites and the subcutaneous tunnel track with 1% lidocaine with 1:100,000 epinephrine. A small incision was created at the jugular access site and blunt dissection was performed of the subcutaneous tissues. Under ultrasound guidance, the jugular vein was accessed with a 21 ga micropuncture needle and an 0.018" wire was inserted to the superior vena cava. Real-time ultrasound guidance was utilized for vascular access including the acquisition of a permanent ultrasound image documenting patency of the accessed vessel. A 5 Fr micopuncture set was then used, through which a 0.035" Rosen wire was passed under fluoroscopic guidance into the inferior vena cava. An 8 Fr dilator was then placed over the wire. A subcutaneous port pocket was then created along the upper chest wall utilizing a combination of sharp and blunt dissection. The pocket was irrigated with sterile saline, packed with gauze, and observed for hemorrhage. A single lumen "ISP" sized power injectable port was chosen for placement. The 8 Fr catheter was tunneled from the port pocket site to the venotomy incision. The port was placed in the pocket. The external catheter was trimmed to appropriate length. The dilator was exchanged for an 8 Fr peel-away sheath under fluoroscopic guidance. The catheter was then placed through the sheath and the sheath was removed. Final catheter positioning was confirmed and documented with a fluoroscopic spot radiograph. The port was accessed with a Huber needle, aspirated, and flushed with heparinized saline. The deep dermal layer of the port pocket incision was closed with interrupted 3-0 Vicryl suture. The skin was opposed with a running subcuticular 4-0 Monocryl suture. Dermabond was then placed over  the port pocket and neck incisions. The patient tolerated the procedure well without immediate post procedural complication. FINDINGS: After catheter placement, the tip lies within the superior cavoatrial junction. The catheter aspirates and flushes normally and is ready for immediate use. IMPRESSION: Successful placement of a power injectable Port-A-Cath via the right internal jugular vein. The catheter is ready for immediate use. Ruthann Cancer, MD [facility]iology Electronically Signed   By: Ruthann Cancer M.D.   On:  04/09/2022 12:44     

## 2022-05-02 NOTE — Progress Notes (Signed)
Nutrition Follow-up:   Patient with stage IV right tonsillar SCC with cervical lymph node mets.  Patient receiving concurrent chemotherapy and radiation.    Met with patient today in infusion.  He reports his appetite is good but taste is a concern. He can't swallow foods that are not tasting they way they should.  Says "they swell up in mouth, can't get em down."  Tolerating all textures no pain with swallowing but many food intolerances (chocolate, milk, citrus, peanut butter sandwiches will eat on crackers). Getting tired of ensure shakes " Want to eat" hasn't been drinking but will get back to once a day.  Tolerates BBQ sauce and will consider using to add flavor to food that don't have any taste.   Had some GERD but improved, bowels slow some constipation.    Medications: Oral iron discontinued today    Labs: reviewed 05/02/22  K+ 3.3 Hgb 11.9 falling   Anthropometrics: patient has lost 8# since last see by RD   Weight: 05/02/22  162# 04/04/22 170 lb 03/27/22  168 lb 11.2 oz  UBW of 176 lb    NUTRITION DIAGNOSIS: Inadequate oral intake continues       INTERVENTION:   Encouraged high calorie high protein snacks between meals tip sheet provided Encourage experimenting with sauces and gravies to add flavor and aid in tolerance. Encouraged increased fiber with foods he tolerates for bowel regularity (pinto beans, banana)  and high calorie fluids Encouraged good dietary iron sources Gave tip sheet soft moist protein foods.   Encouraged return to at least 1 ONS daily, offered coupons he declined     MONITORING, EVALUATION, GOAL: weight trends, intake     NEXT VISIT: remote follow up 2 weeks  Clarence Skinner, RDN, LDN Registered Dietitian, [place] Part Time Remote (Usual office hours: Tuesday-Thursday) Mobile: [phone]

## 2022-05-02 NOTE — Assessment & Plan Note (Signed)
Encourage oral hydration  Improved.  No recurrent symptoms.

## 2022-05-03 ENCOUNTER — Other Ambulatory Visit: Payer: Self-pay

## 2022-05-03 ENCOUNTER — Ambulatory Visit
Admission: RE | Admit: 2022-05-03 | Discharge: 2022-05-03 | Disposition: A | Discharge status: Home / Self Care | Payer: Medicaid Other | Source: Ambulatory Visit | Attending: Radiation Oncology | Admitting: Radiation Oncology

## 2022-05-03 DIAGNOSIS — Z7689 Persons encountering health services in other specified circumstances: Secondary | ICD-10-CM | POA: Diagnosis not present

## 2022-05-03 DIAGNOSIS — Z510 Encounter for antineoplastic radiation therapy: Secondary | ICD-10-CM | POA: Diagnosis not present

## 2022-05-03 DIAGNOSIS — Z515 Encounter for palliative care: Secondary | ICD-10-CM | POA: Diagnosis not present

## 2022-05-03 DIAGNOSIS — K1230 Oral mucositis (ulcerative), unspecified: Secondary | ICD-10-CM | POA: Diagnosis not present

## 2022-05-03 DIAGNOSIS — Z5111 Encounter for antineoplastic chemotherapy: Secondary | ICD-10-CM | POA: Diagnosis not present

## 2022-05-03 DIAGNOSIS — F17210 Nicotine dependence, cigarettes, uncomplicated: Secondary | ICD-10-CM | POA: Diagnosis not present

## 2022-05-03 DIAGNOSIS — Z20822 Contact with and (suspected) exposure to covid-19: Secondary | ICD-10-CM | POA: Diagnosis not present

## 2022-05-03 DIAGNOSIS — G9001 Carotid sinus syncope: Secondary | ICD-10-CM | POA: Diagnosis not present

## 2022-05-03 DIAGNOSIS — C099 Malignant neoplasm of tonsil, unspecified: Secondary | ICD-10-CM | POA: Diagnosis not present

## 2022-05-03 DIAGNOSIS — C770 Secondary and unspecified malignant neoplasm of lymph nodes of head, face and neck: Secondary | ICD-10-CM | POA: Diagnosis not present

## 2022-05-03 DIAGNOSIS — G893 Neoplasm related pain (acute) (chronic): Secondary | ICD-10-CM | POA: Diagnosis not present

## 2022-05-03 LAB — RAD ONC ARIA SESSION SUMMARY
Course Elapsed Days: 37
Plan Fractions Treated to Date: 16
Plan Prescribed Dose Per Fraction: 2 Gy
Plan Total Fractions Prescribed: 35
Plan Total Prescribed Dose: 70 Gy
Reference Point Dosage Given to Date: 32 Gy
Reference Point Session Dosage Given: 2 Gy
Session Number: 16

## 2022-05-04 ENCOUNTER — Ambulatory Visit
Admission: RE | Admit: 2022-05-04 | Discharge: 2022-05-04 | Disposition: A | Discharge status: Home / Self Care | Payer: Medicaid Other | Source: Ambulatory Visit | Attending: Radiation Oncology | Admitting: Radiation Oncology

## 2022-05-04 ENCOUNTER — Other Ambulatory Visit: Payer: Self-pay

## 2022-05-04 DIAGNOSIS — Z7689 Persons encountering health services in other specified circumstances: Secondary | ICD-10-CM | POA: Diagnosis not present

## 2022-05-04 DIAGNOSIS — G9001 Carotid sinus syncope: Secondary | ICD-10-CM | POA: Diagnosis not present

## 2022-05-04 DIAGNOSIS — C099 Malignant neoplasm of tonsil, unspecified: Secondary | ICD-10-CM | POA: Diagnosis not present

## 2022-05-04 DIAGNOSIS — C770 Secondary and unspecified malignant neoplasm of lymph nodes of head, face and neck: Secondary | ICD-10-CM | POA: Diagnosis not present

## 2022-05-04 DIAGNOSIS — Z510 Encounter for antineoplastic radiation therapy: Secondary | ICD-10-CM | POA: Diagnosis not present

## 2022-05-04 DIAGNOSIS — K1230 Oral mucositis (ulcerative), unspecified: Secondary | ICD-10-CM | POA: Diagnosis not present

## 2022-05-04 DIAGNOSIS — G893 Neoplasm related pain (acute) (chronic): Secondary | ICD-10-CM | POA: Diagnosis not present

## 2022-05-04 DIAGNOSIS — F17210 Nicotine dependence, cigarettes, uncomplicated: Secondary | ICD-10-CM | POA: Diagnosis not present

## 2022-05-04 DIAGNOSIS — Z20822 Contact with and (suspected) exposure to covid-19: Secondary | ICD-10-CM | POA: Diagnosis not present

## 2022-05-04 DIAGNOSIS — Z5111 Encounter for antineoplastic chemotherapy: Secondary | ICD-10-CM | POA: Diagnosis not present

## 2022-05-04 DIAGNOSIS — Z515 Encounter for palliative care: Secondary | ICD-10-CM | POA: Diagnosis not present

## 2022-05-04 LAB — RAD ONC ARIA SESSION SUMMARY
Course Elapsed Days: 38
Plan Fractions Treated to Date: 17
Plan Prescribed Dose Per Fraction: 2 Gy
Plan Total Fractions Prescribed: 35
Plan Total Prescribed Dose: 70 Gy
Reference Point Dosage Given to Date: 34 Gy
Reference Point Session Dosage Given: 2 Gy
Session Number: 17

## 2022-05-04 NOTE — Progress Notes (Addendum)
Called to speak with patient regarding grants, reached his voicemail. Left a message asking him to return my call.  -Spoke with patient he will bring in needed information on Monday 05/07/2022. I will leave paperwork with Lenon Curt for him to sign.

## 2022-05-07 ENCOUNTER — Ambulatory Visit
Admission: RE | Admit: 2022-05-07 | Discharge: 2022-05-07 | Disposition: A | Discharge status: Home / Self Care | Payer: Medicaid Other | Source: Ambulatory Visit | Attending: Radiation Oncology | Admitting: Radiation Oncology

## 2022-05-07 ENCOUNTER — Other Ambulatory Visit: Payer: Self-pay

## 2022-05-07 DIAGNOSIS — Z515 Encounter for palliative care: Secondary | ICD-10-CM | POA: Diagnosis not present

## 2022-05-07 DIAGNOSIS — G9001 Carotid sinus syncope: Secondary | ICD-10-CM | POA: Diagnosis not present

## 2022-05-07 DIAGNOSIS — K1230 Oral mucositis (ulcerative), unspecified: Secondary | ICD-10-CM | POA: Diagnosis not present

## 2022-05-07 DIAGNOSIS — Z510 Encounter for antineoplastic radiation therapy: Secondary | ICD-10-CM | POA: Diagnosis not present

## 2022-05-07 DIAGNOSIS — Z20822 Contact with and (suspected) exposure to covid-19: Secondary | ICD-10-CM | POA: Diagnosis not present

## 2022-05-07 DIAGNOSIS — Z5111 Encounter for antineoplastic chemotherapy: Secondary | ICD-10-CM | POA: Diagnosis not present

## 2022-05-07 DIAGNOSIS — G893 Neoplasm related pain (acute) (chronic): Secondary | ICD-10-CM | POA: Diagnosis not present

## 2022-05-07 DIAGNOSIS — C099 Malignant neoplasm of tonsil, unspecified: Secondary | ICD-10-CM | POA: Diagnosis not present

## 2022-05-07 DIAGNOSIS — F17210 Nicotine dependence, cigarettes, uncomplicated: Secondary | ICD-10-CM | POA: Diagnosis not present

## 2022-05-07 DIAGNOSIS — C770 Secondary and unspecified malignant neoplasm of lymph nodes of head, face and neck: Secondary | ICD-10-CM | POA: Diagnosis not present

## 2022-05-07 LAB — RAD ONC ARIA SESSION SUMMARY
Course Elapsed Days: 41
Plan Fractions Treated to Date: 18
Plan Prescribed Dose Per Fraction: 2 Gy
Plan Total Fractions Prescribed: 35
Plan Total Prescribed Dose: 70 Gy
Reference Point Dosage Given to Date: 36 Gy
Reference Point Session Dosage Given: 2 Gy
Session Number: 18

## 2022-05-07 NOTE — Progress Notes (Signed)
Patient approved for the [place] on 05/04/2022.

## 2022-05-08 ENCOUNTER — Other Ambulatory Visit: Payer: Self-pay

## 2022-05-08 ENCOUNTER — Ambulatory Visit
Admission: RE | Admit: 2022-05-08 | Discharge: 2022-05-08 | Disposition: A | Discharge status: Home / Self Care | Payer: Medicaid Other | Source: Ambulatory Visit | Attending: Radiation Oncology | Admitting: Radiation Oncology

## 2022-05-08 DIAGNOSIS — Z7689 Persons encountering health services in other specified circumstances: Secondary | ICD-10-CM | POA: Diagnosis not present

## 2022-05-08 DIAGNOSIS — C099 Malignant neoplasm of tonsil, unspecified: Secondary | ICD-10-CM | POA: Diagnosis not present

## 2022-05-08 DIAGNOSIS — Z5111 Encounter for antineoplastic chemotherapy: Secondary | ICD-10-CM | POA: Diagnosis not present

## 2022-05-08 DIAGNOSIS — G9001 Carotid sinus syncope: Secondary | ICD-10-CM | POA: Diagnosis not present

## 2022-05-08 DIAGNOSIS — F17210 Nicotine dependence, cigarettes, uncomplicated: Secondary | ICD-10-CM | POA: Diagnosis not present

## 2022-05-08 DIAGNOSIS — Z515 Encounter for palliative care: Secondary | ICD-10-CM | POA: Diagnosis not present

## 2022-05-08 DIAGNOSIS — Z20822 Contact with and (suspected) exposure to covid-19: Secondary | ICD-10-CM | POA: Diagnosis not present

## 2022-05-08 DIAGNOSIS — G893 Neoplasm related pain (acute) (chronic): Secondary | ICD-10-CM | POA: Diagnosis not present

## 2022-05-08 DIAGNOSIS — K1230 Oral mucositis (ulcerative), unspecified: Secondary | ICD-10-CM | POA: Diagnosis not present

## 2022-05-08 DIAGNOSIS — C770 Secondary and unspecified malignant neoplasm of lymph nodes of head, face and neck: Secondary | ICD-10-CM | POA: Diagnosis not present

## 2022-05-08 DIAGNOSIS — Z510 Encounter for antineoplastic radiation therapy: Secondary | ICD-10-CM | POA: Diagnosis not present

## 2022-05-08 LAB — RAD ONC ARIA SESSION SUMMARY
Course Elapsed Days: 42
Plan Fractions Treated to Date: 19
Plan Prescribed Dose Per Fraction: 2 Gy
Plan Total Fractions Prescribed: 35
Plan Total Prescribed Dose: 70 Gy
Reference Point Dosage Given to Date: 38 Gy
Reference Point Session Dosage Given: 2 Gy
Session Number: 19

## 2022-05-08 MED FILL — Dexamethasone Sodium Phosphate Inj 100 MG/10ML: INTRAMUSCULAR | Qty: 1 | Status: AC

## 2022-05-09 ENCOUNTER — Inpatient Hospital Stay (HOSPITAL_BASED_OUTPATIENT_CLINIC_OR_DEPARTMENT_OTHER): Payer: Medicaid Other | Admitting: Oncology

## 2022-05-09 ENCOUNTER — Other Ambulatory Visit: Payer: Self-pay

## 2022-05-09 ENCOUNTER — Encounter: Payer: Self-pay | Admitting: Oncology

## 2022-05-09 ENCOUNTER — Inpatient Hospital Stay: Payer: Medicaid Other

## 2022-05-09 ENCOUNTER — Ambulatory Visit
Admission: RE | Admit: 2022-05-09 | Discharge: 2022-05-09 | Disposition: A | Discharge status: Home / Self Care | Payer: Medicaid Other | Source: Ambulatory Visit | Attending: Radiation Oncology | Admitting: Radiation Oncology

## 2022-05-09 VITALS — BP 115/72 | HR 76

## 2022-05-09 VITALS — BP 87/61 | HR 110 | Temp 97.4°F | Resp 16 | Wt 164.9 lb

## 2022-05-09 DIAGNOSIS — C099 Malignant neoplasm of tonsil, unspecified: Secondary | ICD-10-CM

## 2022-05-09 DIAGNOSIS — Z515 Encounter for palliative care: Secondary | ICD-10-CM | POA: Diagnosis not present

## 2022-05-09 DIAGNOSIS — F17210 Nicotine dependence, cigarettes, uncomplicated: Secondary | ICD-10-CM | POA: Diagnosis not present

## 2022-05-09 DIAGNOSIS — B370 Candidal stomatitis: Secondary | ICD-10-CM | POA: Insufficient documentation

## 2022-05-09 DIAGNOSIS — C770 Secondary and unspecified malignant neoplasm of lymph nodes of head, face and neck: Secondary | ICD-10-CM

## 2022-05-09 DIAGNOSIS — K1230 Oral mucositis (ulcerative), unspecified: Secondary | ICD-10-CM

## 2022-05-09 DIAGNOSIS — Z5111 Encounter for antineoplastic chemotherapy: Secondary | ICD-10-CM | POA: Diagnosis not present

## 2022-05-09 DIAGNOSIS — G893 Neoplasm related pain (acute) (chronic): Secondary | ICD-10-CM | POA: Diagnosis not present

## 2022-05-09 DIAGNOSIS — G9001 Carotid sinus syncope: Secondary | ICD-10-CM | POA: Diagnosis not present

## 2022-05-09 DIAGNOSIS — Z20822 Contact with and (suspected) exposure to covid-19: Secondary | ICD-10-CM | POA: Diagnosis not present

## 2022-05-09 DIAGNOSIS — I959 Hypotension, unspecified: Secondary | ICD-10-CM | POA: Diagnosis not present

## 2022-05-09 DIAGNOSIS — I9589 Other hypotension: Secondary | ICD-10-CM | POA: Diagnosis not present

## 2022-05-09 DIAGNOSIS — E861 Hypovolemia: Secondary | ICD-10-CM

## 2022-05-09 DIAGNOSIS — Z510 Encounter for antineoplastic radiation therapy: Secondary | ICD-10-CM | POA: Diagnosis not present

## 2022-05-09 DIAGNOSIS — Z7689 Persons encountering health services in other specified circumstances: Secondary | ICD-10-CM | POA: Diagnosis not present

## 2022-05-09 LAB — CBC WITH DIFFERENTIAL/PLATELET
Abs Immature Granulocytes: 0.02 10*3/uL (ref 0.00–0.07)
Basophils Absolute: 0 10*3/uL (ref 0.0–0.1)
Basophils Relative: 1 %
Eosinophils Absolute: 0.1 10*3/uL (ref 0.0–0.5)
Eosinophils Relative: 2 %
HCT: 33 % — ABNORMAL LOW (ref 39.0–52.0)
Hemoglobin: 10.7 g/dL — ABNORMAL LOW (ref 13.0–17.0)
Immature Granulocytes: 0 %
Lymphocytes Relative: 14 %
Lymphs Abs: 0.7 10*3/uL (ref 0.7–4.0)
MCH: 24.8 pg — ABNORMAL LOW (ref 26.0–34.0)
MCHC: 32.4 g/dL (ref 30.0–36.0)
MCV: 76.6 fL — ABNORMAL LOW (ref 80.0–100.0)
Monocytes Absolute: 0.5 10*3/uL (ref 0.1–1.0)
Monocytes Relative: 9 %
Neutro Abs: 3.6 10*3/uL (ref 1.7–7.7)
Neutrophils Relative %: 74 %
Platelets: 252 10*3/uL (ref 150–400)
RBC: 4.31 MIL/uL (ref 4.22–5.81)
RDW: 15.9 % — ABNORMAL HIGH (ref 11.5–15.5)
WBC: 5 10*3/uL (ref 4.0–10.5)
nRBC: 0 % (ref 0.0–0.2)

## 2022-05-09 LAB — COMPREHENSIVE METABOLIC PANEL
ALT: 13 U/L (ref 0–44)
AST: 14 U/L — ABNORMAL LOW (ref 15–41)
Albumin: 3.8 g/dL (ref 3.5–5.0)
Alkaline Phosphatase: 66 U/L (ref 38–126)
Anion gap: 7 (ref 5–15)
BUN: 10 mg/dL (ref 6–20)
CO2: 24 mmol/L (ref 22–32)
Calcium: 8.9 mg/dL (ref 8.9–10.3)
Chloride: 102 mmol/L (ref 98–111)
Creatinine, Ser: 0.74 mg/dL (ref 0.61–1.24)
GFR, Estimated: >60 mL/min (ref >60)
Glucose, Bld: 104 mg/dL — ABNORMAL HIGH (ref 70–99)
Potassium: 3.7 mmol/L (ref 3.5–5.1)
Sodium: 133 mmol/L — ABNORMAL LOW (ref 135–145)
Total Bilirubin: 0.5 mg/dL (ref 0.3–1.2)
Total Protein: 7.4 g/dL (ref 6.5–8.1)

## 2022-05-09 LAB — RAD ONC ARIA SESSION SUMMARY
Course Elapsed Days: 43
Plan Fractions Treated to Date: 20
Plan Prescribed Dose Per Fraction: 2 Gy
Plan Total Fractions Prescribed: 35
Plan Total Prescribed Dose: 70 Gy
Reference Point Dosage Given to Date: 40 Gy
Reference Point Session Dosage Given: 2 Gy
Session Number: 20

## 2022-05-09 MED ORDER — NYSTATIN 100000 UNIT/ML MT SUSP
5.0000 mL | Freq: Four times a day (QID) | OROMUCOSAL | 0 refills | Status: DC
  Filled 2022-05-09 (×2): qty 473, 24d supply, fill #0

## 2022-05-09 MED ORDER — NYSTATIN 100000 UNIT/ML MT SUSP: 5.0000 mL | Freq: Four times a day (QID) | OROMUCOSAL | 0 refills | Status: DC

## 2022-05-09 MED ORDER — PALONOSETRON HCL INJECTION 0.25 MG/5ML
0.2500 mg | Freq: Once | INTRAVENOUS | Status: AC
  Administered 2022-05-09: 0.25 mg via INTRAVENOUS
  Filled 2022-05-09: qty 5

## 2022-05-09 MED ORDER — NYSTATIN 100000 UNIT/ML MT SUSP
OROMUCOSAL | 0 refills | Status: DC
  Filled 2022-05-09: qty 480, 12d supply, fill #0

## 2022-05-09 MED ORDER — HEPARIN SOD (PORK) LOCK FLUSH 100 UNIT/ML IV SOLN
500.0000 [IU] | Freq: Once | INTRAVENOUS | Status: AC | PRN
  Administered 2022-05-09: 500 [IU]
  Filled 2022-05-09: qty 5

## 2022-05-09 MED ORDER — STERILE WATER FOR INJECTION IJ SOLN
OROMUCOSAL | 1 refills | Status: DC
  Filled 2022-05-09: qty 480, 12d supply, fill #0
  Filled 2022-05-17 – 2022-05-25 (×2): qty 480, 12d supply, fill #1

## 2022-05-09 MED ORDER — SODIUM CHLORIDE 0.9 % IV SOLN
10.0000 mg | Freq: Once | INTRAVENOUS | Status: AC
  Administered 2022-05-09: 10 mg via INTRAVENOUS
  Filled 2022-05-09: qty 10

## 2022-05-09 MED ORDER — SODIUM CHLORIDE 0.9% FLUSH
10.0000 mL | INTRAVENOUS | Status: DC | PRN
  Filled 2022-05-09: qty 10

## 2022-05-09 MED ORDER — SODIUM CHLORIDE 0.9 % IV SOLN
260.0000 mg | Freq: Once | INTRAVENOUS | Status: AC
  Administered 2022-05-09: 260 mg via INTRAVENOUS
  Filled 2022-05-09: qty 26

## 2022-05-09 MED ORDER — MAGIC MOUTHWASH W/LIDOCAINE: 5.0000 mL | Freq: Four times a day (QID) | ORAL | 1 refills | Status: DC | PRN

## 2022-05-09 MED ORDER — SODIUM CHLORIDE 0.9 % IV SOLN
Freq: Once | INTRAVENOUS | Status: AC
  Filled 2022-05-09: qty 250

## 2022-05-09 NOTE — Progress Notes (Signed)
Pt in follow up reports has been having headache in back of head more frequently.  Pt also reports having dizziness when stands up. Pt reports PCP took off of iron supplement.

## 2022-05-09 NOTE — Progress Notes (Signed)
Nutrition  See documentation note.  Milissa Fesperman B. Zenia Resides, [place], [place] Registered Dietitian [phone]

## 2022-05-09 NOTE — Assessment & Plan Note (Signed)
Treatment plan as listed above. 

## 2022-05-09 NOTE — Assessment & Plan Note (Signed)
Nystatin mouth wash swish and swallow. Rx sent

## 2022-05-09 NOTE — Assessment & Plan Note (Addendum)
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 Recommend concurrent chemotherapy with radiation. Labs are reviewed and discussed with patient. Proceed with carboplatin AUC 2   

## 2022-05-09 NOTE — Patient Instructions (Signed)
[facility] CANCER CTR AT [place]  Discharge Instructions: Thank you for choosing [facility] to provide your oncology and hematology care.  If you have a lab appointment with the [facility], please go directly to the [facility] and check in at the registration area.  Wear comfortable clothing and clothing appropriate for easy access to any Portacath or PICC line.   We strive to give you quality time with your provider. You may need to reschedule your appointment if you arrive late (15 or more minutes).  Arriving late affects you and other patients whose appointments are after yours.  Also, if you miss three or more appointments without notifying the office, you may be dismissed from the clinic at the provider's discretion.      For prescription refill requests, have your pharmacy contact our office and allow 72 hours for refills to be completed.    Today you received the following chemotherapy and/or immunotherapy agents- carboplatin      To help prevent nausea and vomiting after your treatment, we encourage you to take your nausea medication as directed.  BELOW ARE SYMPTOMS THAT SHOULD BE REPORTED IMMEDIATELY: *FEVER GREATER THAN 100.4 F (38 C) OR HIGHER *CHILLS OR SWEATING *NAUSEA AND VOMITING THAT IS NOT CONTROLLED WITH YOUR NAUSEA MEDICATION *UNUSUAL SHORTNESS OF BREATH *UNUSUAL BRUISING OR BLEEDING *URINARY PROBLEMS (pain or burning when urinating, or frequent urination) *BOWEL PROBLEMS (unusual diarrhea, constipation, pain near the anus) TENDERNESS IN MOUTH AND THROAT WITH OR WITHOUT PRESENCE OF ULCERS (sore throat, sores in mouth, or a toothache) UNUSUAL RASH, SWELLING OR PAIN  UNUSUAL VAGINAL DISCHARGE OR ITCHING   Items with * indicate a potential emergency and should be followed up as soon as possible or go to the Emergency Department if any problems should occur.  Please show the CHEMOTHERAPY ALERT CARD or IMMUNOTHERAPY ALERT CARD at check-in  to the Emergency Department and triage nurse.  Should you have questions after your visit or need to cancel or reschedule your appointment, please contact [facility] CANCER [place] AT [place]  [phone] and follow the prompts.  Office hours are 8:00 a.m. to 4:30 p.m. Monday - Friday. Please note that voicemails left after 4:00 p.m. may not be returned until the following business day.  We are closed weekends and major holidays. You have access to a nurse at all times for urgent questions. Please call the main number to the clinic [phone] and follow the prompts.  For any non-urgent questions, you may also contact your provider using MyChart. We now offer e-Visits for anyone 79 and older to request care online for non-urgent symptoms. For details visit [email or web address].   Also download the MyChart app! Go to the app store, search "MyChart", open the app, select [facility], and log in with your MyChart username and password.  Masks are optional in the cancer centers. If you would like for your care team to wear a mask while they are taking care of you, please let them know. For doctor visits, patients may have with them one support person who is at least 59 years old. At this time, visitors are not allowed in the infusion area.

## 2022-05-09 NOTE — Assessment & Plan Note (Signed)
Sucralfate liquid 4 times daily. Magic mouth wash swish and swallow PRN mouth sore /swallowing pain

## 2022-05-09 NOTE — Progress Notes (Signed)
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 Recommend concurrent chemotherapy with radiation. Labs are reviewed and discussed with patient. Proceed with carboplatin AUC 2    Encounter for antineoplastic chemotherapy Treatment plan as listed above  Hypotension IVF NS 1L x 1h today I ask RN to repeat vital   Thrush Nystatin mouth wash swish and swallow. Rx sent   Mucositis Sucralfate liquid 4 times daily. Magic mouth wash swish and swallow PRN mouth sore /swallowing pain   No orders of the defined types were placed in this encounter.   Follow up 1 week lab MD carboplatin  All questions were answered. The patient knows to call the clinic with any problems, questions or concerns. No barriers to learning was detected.  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 male presents to establish care for  I have reviewed his chart and materials related to his cancer extensively and collaborated history with the patient. 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 This likely reflects an enlarged lymph node.  2 Abnormal soft tissue surrounding the mid-to-distal cervical right ICA with resultant mild vessel narrowing 3 Atherosclerotic plaque within the bilateral common carotid and cervical internal carotid arteries without hemodynamically significant stenosis. 4 Vertebral arteries patent within the neck. Mild-to-moderate atherosclerotic narrowing at the origin of the right vertebral artery. No more than mild atherosclerotic stenosis at the origin of the left vertebral artery. 5 Aortic Atherosclerosis and Emphysema 6. Cervical spondylosis   02/27/2022 Imaging   MRI neck soft tissue w wo Constellation of findings most compatible with a roughly 2.4 cm Right Tonsillar Carcinoma (series 12, image 11) with associated right level 2 and level 3 malignant nodal, ex-nodal disease (series 14, image 13). Recommend ENT consultation    02/27/2022 Imaging   02/27/2022 MRI brain without contrast showed  No acute intracranial abnormality. Abnormal right neck soft tissues. See dedicated Neck MRI today reported separately. 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 These are slow flow vascular malformations which are generally asymptomatic. [name] Other moderately advanced but nonspecific cerebral white matter signal changes.   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 Metastatic squamous cell carcinoma. Moderately differentiated with focal keratinization.  p16 negative.    02/28/2022 Cancer Staging   Staging form: Pharynx - P16 Negative Oropharynx, AJCC 8th Edition - Clinical stage from 02/28/2022: Stage IVA (cT2, cN2b, cM0, p16-) - Signed by [name], MD on 03/22/2022 Stage prefix: Initial  diagnosis   03/19/2022 Imaging   PET scan showed Signs of RIGHT  neck malignancy with metastatic involvement just below the skull base and at level II/III also on the RIGHT. No signs of contralateral nodal involvement.   03/28/2022 -  Chemotherapy   Patient is on Treatment Plan : HEAD/NECK Carboplatin (AUC 2) q7d      Patient initially presented emergency room due to intermittent syncope/near syncope episodes, diaphoretic, nauseated. Imaging findings are concerning for right tonsil mass with level 2/level 3 lymph node involvement which contributes to syncope/near syncope episodes -carotid sinus syndrome. 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  COVID-19 infection.  Chemotherapy radiation was held for 2 weeks.    INTERVAL HISTORY Clarence Skinner is a 61 y.o. male who has above history reviewed by me today presents for follow up visit for management of stage IVa right tonsil squamous cell carcinoma Decrease of taste, hard to swallow, no dysphagia.+ Sore throat, patient has been utilizing sucralfate solution for mucositis. Decreased oral hydration due to sore throat.  No fever, chills, nausea vomiting. Mild dizziness  MEDICAL HISTORY:  Past Medical History:  Diagnosis Date   COPD (chronic obstructive pulmonary disease) (HCC)    GERD (gastroesophageal reflux disease)    Gout    Hypertension    Multilevel degenerative disc disease    Pneumonia 11/29/2017   Sleep apnea    CPAP   Tachycardia    Wears dentures    partial upper and lower    SURGICAL HISTORY: Past Surgical History:  Procedure Laterality Date   ABCESS DRAINAGE  2009   tonsil   COLONOSCOPY WITH PROPOFOL N/A 02/11/2018   Procedure: COLONOSCOPY WITH PROPOFOL;  Surgeon: Toledo, Benay Pike, MD;  Location: [facility] ENDOSCOPY;  Service: Endoscopy;  Laterality: N/A;   ESOPHAGOGASTRODUODENOSCOPY (EGD) WITH PROPOFOL N/A 02/11/2018   Procedure: ESOPHAGOGASTRODUODENOSCOPY (EGD) WITH PROPOFOL;  Surgeon: Toledo, Benay Pike, MD;  Location: [facility] ENDOSCOPY;  Service: Endoscopy;  Laterality:  N/A;   IR IMAGING GUIDED PORT INSERTION  04/09/2022   MASS EXCISION Right 08/14/2017   Procedure: EXCISION RIGHT TEMPORAL MASS SUBMENTAL MASS;  Surgeon: Carloyn Manner, MD;  Location: [facility];  Service: ENT;  Laterality: Right;  sleep apnea   RADIOACTIVE SEED IMPLANT N/A 01/26/2019   Procedure: RADIOACTIVE SEED IMPLANT/BRACHYTHERAPY IMPLANT;  Surgeon: Billey Co, MD;  Location: [facility] ORS;  Service: Urology;  Laterality: N/A;   SEPTOPLASTY  2016   [place], [place]   VOLUME STUDY N/A 12/29/2018   Procedure: VOLUME STUDY;  Surgeon: Billey Co, MD;  Location: [facility] ORS;  Service: Urology;  Laterality: N/A;    SOCIAL HISTORY: Social History   Socioeconomic History   Marital status: Significant Other    Spouse name: Not on file   Number of children: Not on file   Years of education: Not on file   Highest education level: Not on file  Occupational History   Not on file  Tobacco Use   Smoking status: Every Day    Packs/day: 1.00    Years: 30.00    Total pack years: 30.00    Types: Cigarettes   Smokeless tobacco: Former    Types: Snuff, Chew    Quit date: 12/05/1984   Tobacco comments:    since age 54  Vaping Use   Vaping Use: Never used  Substance and Sexual Activity   Alcohol use: Yes    Alcohol/week: 12.0 standard drinks of alcohol    Types: 12 Cans of beer per week  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   Drug use: Not Currently   Sexual activity: Yes  Other Topics Concern   Not on file  Social History Narrative   Not on file   Social Determinants of Health   Financial Resource Strain: High Risk (04/03/2022)   Overall Financial Resource Strain (CARDIA)    Difficulty of Paying Living Expenses: Hard  Food Insecurity: Food Insecurity Present (04/03/2022)   Hunger Vital Sign    Worried About Running Out of Food in the Last Year: Often true    Ran Out of Food in the Last Year: Often true  Transportation Needs: Unmet Transportation Needs  (04/03/2022)   PRAPARE - Hydrologist (Medical): Yes    Lack of Transportation (Non-Medical): Yes  Physical Activity: Inactive (04/03/2022)   Exercise Vital Sign    Days of Exercise per Week: 0 days    Minutes of Exercise per Session: 0 min  Stress: Stress Concern Present (04/03/2022)   [place]    Feeling of Stress : Very much  Social Connections: Moderately Isolated (04/03/2022)   Social Connection and Isolation Panel [NHANES]    Frequency of Communication with Friends and Family: Never    Frequency of Social Gatherings with Friends and Family: Three times a week    Attends Religious Services: Never    Active Member of Clubs or Organizations: No    Attends Archivist Meetings: Not on file    Marital Status: Living with partner  Intimate Partner Violence: Not At Risk (04/03/2022)   Humiliation, Afraid, Rape, and Kick questionnaire    Fear of Current or Ex-Partner: No    Emotionally Abused: No    Physically Abused: No    Sexually Abused: No    FAMILY HISTORY: Family History  Problem Relation Age of Onset   Anxiety disorder Mother    Cancer Father    Hypertension Sister    Diabetes Sister    Asthma Sister    Other Brother        mva  at age 41   Gout Brother    Hypertension Brother    Prostate cancer Maternal Uncle    Prostate cancer Maternal Uncle     ALLERGIES:  is allergic to chocolate, eggs or egg-derived products, milk (cow), benadryl [diphenhydramine], and milk-related compounds.  MEDICATIONS:  Current Outpatient Medications  Medication Sig Dispense Refill   albuterol (PROVENTIL HFA;VENTOLIN HFA) 108 (90 Base) MCG/ACT inhaler Inhale into the lungs every 6 (six) hours as needed for wheezing or shortness of breath.     allopurinol (ZYLOPRIM) 300 MG tablet Take 300 mg by mouth daily.     amLODipine-benazepril (LOTREL) 10-40 MG capsule Take 1 capsule by mouth daily.      Azelastine HCl 137 MCG/SPRAY SOLN PLACE 1 SPRAY INTO THE NOSE 2 (TWO) TIMES DAILY AS NEEDED. 30 mL 1   baclofen (LIORESAL) 10 MG tablet Take 10 mg by mouth 2 (two) times daily as needed.     benzonatate (TESSALON) 100 MG capsule Take 1 capsule (100 mg total) by mouth every 8 (eight) hours as needed. 30 capsule 0   fluticasone (FLONASE) 50 MCG/ACT nasal spray Place into both nostrils daily as needed for allergies or rhinitis.     Fluticasone-Umeclidin-Vilant (TRELEGY ELLIPTA) 100-62.5-25 MCG/ACT AEPB Trelegy Ellipta 100-62.5-25 MCG/INH Inhalation Aerosol Powder Breath Activated QTY: 90 each Days: 90 Refills: 1  Written: 11/02/20 Patient Instructions: One puff  once a day     furosemide (LASIX) 20 MG tablet Take 20 mg by mouth daily.     gabapentin (NEURONTIN) 100 MG capsule Take 1 capsule (100 mg total) by mouth at bedtime. 30 capsule 1   lidocaine-prilocaine (EMLA) cream Apply 1 Application topically as directed. Apply small amount of cream to port a cath site 1-2 hours prior to port being accessed. 30 g 2   loratadine (CLARITIN) 10 MG tablet Take 10 mg by mouth daily.     magic mouthwash w/lidocaine SOLN Take 5 mLs by mouth 4 (four) times daily as needed for mouth pain. Sig: Swish/Spit 5-10 ml four times a day as needed. Dispense 480 ml. 1RF 480 mL 1   Oxycodone HCl 10 MG TABS Take 10 mg by mouth every 6 (six) hours as needed.     pantoprazole (PROTONIX) 40 MG tablet Take 40 mg by mouth daily.     prochlorperazine (COMPAZINE) 10 MG tablet Take 1 tablet (10 mg total) by mouth every 6 (six) hours as needed for nausea or vomiting. 30 tablet 1   rosuvastatin (CRESTOR) 20 MG tablet Take 1 tablet (20 mg total) by mouth daily. 30 tablet 5   senna (SENOKOT) 8.6 MG TABS tablet Take 1 tablet (8.6 mg total) by mouth daily. 120 tablet 0   SYMBICORT 80-4.5 MCG/ACT inhaler Inhale 1 puff into the lungs 2 (two) times daily.     Vitamin D, Ergocalciferol, (DRISDOL) 1.25 MG (50000 UNIT) CAPS capsule Take 50,000  Units by mouth once a week.     meloxicam (MOBIC) 7.5 MG tablet Take 7.5 mg by mouth daily as needed for pain. (Patient not taking: Reported on 05/09/2022)     nystatin (MYCOSTATIN) 100000 UNIT/ML suspension Take 5 mLs (500,000 Units total) by mouth 4 (four) times daily. Swish and swallow 473 mL 0   potassium chloride SA (KLOR-CON M) 20 MEQ tablet Take 1 tablet (20 mEq total) by mouth daily. (Patient not taking: Reported on 05/09/2022) 3 tablet 0   sucralfate (CARAFATE) 1 GM/10ML suspension Take 10 mLs (1 g total) by mouth 4 (four) times daily -  with meals and at bedtime. (Patient not taking: Reported on 05/09/2022) 420 mL 0   tadalafil (CIALIS) 20 MG tablet Take 1 tablet (20 mg total) by mouth daily. (Patient not taking: Reported on 05/02/2022) 30 tablet 11   No current facility-administered medications for this visit.   Facility-Administered Medications Ordered in Other Visits  Medication Dose Route Frequency Provider Last Rate Last Admin   sodium chloride flush (NS) 0.9 % injection 10 mL  10 mL Intracatheter PRN [name], MD   10 mL at 04/11/22 1221   sodium chloride flush (NS) 0.9 % injection 10 mL  10 mL Intracatheter PRN [name], MD        Review of Systems  Constitutional:  Positive for fatigue. Negative for chills and fever.  HENT:   Positive for hearing loss. Negative for voice change.   Eyes:  Negative for eye problems and icterus.  Respiratory:  Negative for chest tightness, cough and shortness of breath.   Cardiovascular:  Negative for chest pain and leg swelling.  Gastrointestinal:  Negative for abdominal distention and abdominal pain.  Endocrine: Negative for hot flashes.  Genitourinary:  Negative for difficulty urinating, dysuria and frequency.   Musculoskeletal:  Positive for arthralgias and neck pain.  Skin:  Negative for itching and rash.  Neurological:  Negative for light-headedness and numbness.  Hematological:  Negative for adenopathy.  Does not bruise/bleed easily.   Psychiatric/Behavioral:  Negative for confusion.      PHYSICAL EXAMINATION: ECOG PERFORMANCE STATUS: 1 - Symptomatic but completely ambulatory  Vitals:   05/09/22 1000 05/09/22 1001  BP: 96/67 (!) 87/61  Pulse: 100 (!) 110  Resp: 16   Temp: (!) 97.4 F (36.3 C)   SpO2: 100%    Filed Weights   05/09/22 1000  Weight: 164 lb 14.4 oz (74.8 kg)    Physical Exam Constitutional:      General: He is not in acute distress.    Appearance: He is not diaphoretic.  HENT:     Head: Normocephalic and atraumatic.     Nose: Nose normal.     Mouth/Throat:     Pharynx: No oropharyngeal exudate.  Eyes:     General: No scleral icterus.    Pupils: Pupils are equal, round, and reactive to light.  Cardiovascular:     Rate and Rhythm: Normal rate and regular rhythm.     Heart sounds: No murmur heard. Pulmonary:     Effort: Pulmonary effort is normal. No respiratory distress.     Breath sounds: No rales.  Chest:     Chest wall: No tenderness.  Abdominal:     General: There is no distension.     Palpations: Abdomen is soft.     Tenderness: There is no abdominal tenderness.  Musculoskeletal:        General: Normal range of motion.     Cervical back: Normal range of motion and neck supple.  Skin:    General: Skin is warm and dry.     Findings: No erythema.  Neurological:     Mental Status: He is alert and oriented to person, place, and time.     Cranial Nerves: No cranial nerve deficit.     Motor: No abnormal muscle tone.     Coordination: Coordination normal.  Psychiatric:        Mood and Affect: Mood and affect normal.      LABORATORY DATA:  I have reviewed the data as listed    Latest Ref Rng & Units 05/09/2022    9:05 AM 05/02/2022    9:05 AM 04/11/2022    9:08 AM  CBC  WBC 4.0 - 10.5 K/uL 5.0  4.7  6.3   Hemoglobin 13.0 - 17.0 g/dL 10.7  11.9  12.0   Hematocrit 39.0 - 52.0 % 33.0  36.6  36.8   Platelets 150 - 400 K/uL 252  270  290       Latest Ref Rng & Units  05/09/2022    9:05 AM 05/02/2022    9:05 AM 04/11/2022    9:08 AM  CMP  Glucose 70 - 99 mg/dL 104  100  105   BUN 6 - 20 mg/dL '10  9  14   '$ Creatinine 0.61 - 1.24 mg/dL 0.74  0.81  0.92   Sodium 135 - 145 mmol/L 133  138  132   Potassium 3.5 - 5.1 mmol/L 3.7  3.3  4.1   Chloride 98 - 111 mmol/L 102  105  101   CO2 22 - 32 mmol/L '24  27  26   '$ Calcium 8.9 - 10.3 mg/dL 8.9  9.0  8.8   Total Protein 6.5 - 8.1 g/dL 7.4  7.8  7.4   Total Bilirubin 0.3 - 1.2 mg/dL 0.5  0.4  0.5   Alkaline Phos 38 - 126 U/L 66  68  70  AST 15 - 41 U/L '14  16  18   '$ ALT 0 - 44 U/L '13  18  24       '$ RADIOGRAPHIC STUDIES: I have personally reviewed the radiological images as listed and agreed with the findings in the report. No results found.

## 2022-05-09 NOTE — Progress Notes (Signed)
Nutrition  Patient requested visit from RD while in infusion as had questions regarding sugar and cancer.    RD added patient onto schedule for today.  Says that he has been eating more.  Drinking oral nutrition supplements.  Reports thick mucous in throat.    Weight 164 lb 14.4 oz today, increased  162 lb 10/11 170 lb on 9/13 168 lb on 03/27/22 UBW of 176 lb   Intervention Answered questions regarding sugar and cancer. Encouraged continued use of oral nutrition supplements Discussed ways to help thick saliva and handout given.   Next visit: Wed 10/25 phone follow-up ([place])  Clarence Skinner B. Zenia Resides, [place], [place] Registered Dietitian [phone]

## 2022-05-09 NOTE — Assessment & Plan Note (Signed)
IVF NS 1L x 1h today I ask RN to repeat vital

## 2022-05-10 ENCOUNTER — Other Ambulatory Visit: Payer: Self-pay

## 2022-05-10 ENCOUNTER — Ambulatory Visit
Admission: RE | Admit: 2022-05-10 | Discharge: 2022-05-10 | Disposition: A | Discharge status: Home / Self Care | Payer: Medicaid Other | Source: Ambulatory Visit | Attending: Radiation Oncology | Admitting: Radiation Oncology

## 2022-05-10 ENCOUNTER — Inpatient Hospital Stay (HOSPITAL_BASED_OUTPATIENT_CLINIC_OR_DEPARTMENT_OTHER): Payer: Medicaid Other | Admitting: Hospice and Palliative Medicine

## 2022-05-10 DIAGNOSIS — Z510 Encounter for antineoplastic radiation therapy: Secondary | ICD-10-CM | POA: Diagnosis not present

## 2022-05-10 DIAGNOSIS — Z5111 Encounter for antineoplastic chemotherapy: Secondary | ICD-10-CM | POA: Diagnosis not present

## 2022-05-10 DIAGNOSIS — G893 Neoplasm related pain (acute) (chronic): Secondary | ICD-10-CM | POA: Diagnosis not present

## 2022-05-10 DIAGNOSIS — C099 Malignant neoplasm of tonsil, unspecified: Secondary | ICD-10-CM

## 2022-05-10 DIAGNOSIS — F17210 Nicotine dependence, cigarettes, uncomplicated: Secondary | ICD-10-CM | POA: Diagnosis not present

## 2022-05-10 DIAGNOSIS — Z7689 Persons encountering health services in other specified circumstances: Secondary | ICD-10-CM | POA: Diagnosis not present

## 2022-05-10 DIAGNOSIS — G9001 Carotid sinus syncope: Secondary | ICD-10-CM | POA: Diagnosis not present

## 2022-05-10 DIAGNOSIS — Z515 Encounter for palliative care: Secondary | ICD-10-CM | POA: Diagnosis not present

## 2022-05-10 DIAGNOSIS — Z20822 Contact with and (suspected) exposure to covid-19: Secondary | ICD-10-CM | POA: Diagnosis not present

## 2022-05-10 DIAGNOSIS — K1230 Oral mucositis (ulcerative), unspecified: Secondary | ICD-10-CM | POA: Diagnosis not present

## 2022-05-10 DIAGNOSIS — C770 Secondary and unspecified malignant neoplasm of lymph nodes of head, face and neck: Secondary | ICD-10-CM | POA: Diagnosis not present

## 2022-05-10 LAB — RAD ONC ARIA SESSION SUMMARY
Course Elapsed Days: 44
Plan Fractions Treated to Date: 21
Plan Prescribed Dose Per Fraction: 2 Gy
Plan Total Fractions Prescribed: 35
Plan Total Prescribed Dose: 70 Gy
Reference Point Dosage Given to Date: 42 Gy
Reference Point Session Dosage Given: 2 Gy
Session Number: 21

## 2022-05-10 NOTE — Progress Notes (Signed)
Virtual Visit via Telephone Note  I connected with Clarence Skinner on 05/10/22 at  3:20 PM EDT by telephone and verified that I am speaking with the correct person using two identifiers.  Location: Patient: Home Provider: Clinic   I discussed the limitations, risks, security and privacy concerns of performing an evaluation and management service by telephone and the availability of in person appointments. I also discussed with the patient that there may be a patient responsible charge related to this service. The patient expressed understanding and agreed to proceed.   History of Present Illness: Clarence Skinner is a 61 y.o. 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  Patient is on concurrent chemoradiation.   Observations/Objective: I called and spoke with patient by phone.  Patient reports he is doing reasonably well.  He feels like appetite waxes and wanes but is currently been better over the past few days.  Performance status is stable.  Patient denies pain.  Patient asked about exercises to strengthen his upper body and neck.  He is interested in referral to OT.  Assessment and Plan: Stage IV right tonsillar squamous cell carcinoma -on concurrent chemoradiation.  Patient appears to be doing reasonably well.  He requests referral to OT for upper extremity exercises.  Follow Up Instructions: Follow-up telephone visit 2 months   I discussed the assessment and treatment plan with the patient. The patient was provided an opportunity to ask questions and all were answered. The patient agreed with the plan and demonstrated an understanding of the instructions.   The patient was advised to call back or seek an in-person evaluation if the symptoms worsen or if the condition fails to improve as anticipated.  I provided 10 minutes of non-face-to-face time during this  encounter.   Irean Hong, NP

## 2022-05-11 ENCOUNTER — Other Ambulatory Visit: Payer: Self-pay

## 2022-05-11 ENCOUNTER — Ambulatory Visit
Admission: RE | Admit: 2022-05-11 | Discharge: 2022-05-11 | Disposition: A | Discharge status: Home / Self Care | Payer: Medicaid Other | Source: Ambulatory Visit | Attending: Radiation Oncology | Admitting: Radiation Oncology

## 2022-05-11 DIAGNOSIS — Z20822 Contact with and (suspected) exposure to covid-19: Secondary | ICD-10-CM | POA: Diagnosis not present

## 2022-05-11 DIAGNOSIS — Z510 Encounter for antineoplastic radiation therapy: Secondary | ICD-10-CM | POA: Diagnosis not present

## 2022-05-11 DIAGNOSIS — C770 Secondary and unspecified malignant neoplasm of lymph nodes of head, face and neck: Secondary | ICD-10-CM | POA: Diagnosis not present

## 2022-05-11 DIAGNOSIS — Z515 Encounter for palliative care: Secondary | ICD-10-CM | POA: Diagnosis not present

## 2022-05-11 DIAGNOSIS — K1230 Oral mucositis (ulcerative), unspecified: Secondary | ICD-10-CM | POA: Diagnosis not present

## 2022-05-11 DIAGNOSIS — C099 Malignant neoplasm of tonsil, unspecified: Secondary | ICD-10-CM | POA: Diagnosis not present

## 2022-05-11 DIAGNOSIS — Z7689 Persons encountering health services in other specified circumstances: Secondary | ICD-10-CM | POA: Diagnosis not present

## 2022-05-11 DIAGNOSIS — G9001 Carotid sinus syncope: Secondary | ICD-10-CM | POA: Diagnosis not present

## 2022-05-11 DIAGNOSIS — F17210 Nicotine dependence, cigarettes, uncomplicated: Secondary | ICD-10-CM | POA: Diagnosis not present

## 2022-05-11 DIAGNOSIS — Z5111 Encounter for antineoplastic chemotherapy: Secondary | ICD-10-CM | POA: Diagnosis not present

## 2022-05-11 DIAGNOSIS — G893 Neoplasm related pain (acute) (chronic): Secondary | ICD-10-CM | POA: Diagnosis not present

## 2022-05-11 LAB — RAD ONC ARIA SESSION SUMMARY
Course Elapsed Days: 45
Plan Fractions Treated to Date: 22
Plan Prescribed Dose Per Fraction: 2 Gy
Plan Total Fractions Prescribed: 35
Plan Total Prescribed Dose: 70 Gy
Reference Point Dosage Given to Date: 44 Gy
Reference Point Session Dosage Given: 2 Gy
Session Number: 22

## 2022-05-13 ENCOUNTER — Ambulatory Visit: Payer: Medicaid Other

## 2022-05-14 ENCOUNTER — Ambulatory Visit: Payer: Medicaid Other

## 2022-05-14 ENCOUNTER — Ambulatory Visit
Admission: RE | Admit: 2022-05-14 | Discharge: 2022-05-14 | Disposition: A | Discharge status: Home / Self Care | Payer: Medicaid Other | Source: Ambulatory Visit | Attending: Radiation Oncology | Admitting: Radiation Oncology

## 2022-05-14 ENCOUNTER — Other Ambulatory Visit: Payer: Self-pay

## 2022-05-14 DIAGNOSIS — Z7689 Persons encountering health services in other specified circumstances: Secondary | ICD-10-CM | POA: Diagnosis not present

## 2022-05-14 DIAGNOSIS — C099 Malignant neoplasm of tonsil, unspecified: Secondary | ICD-10-CM | POA: Diagnosis not present

## 2022-05-14 DIAGNOSIS — G9001 Carotid sinus syncope: Secondary | ICD-10-CM | POA: Diagnosis not present

## 2022-05-14 DIAGNOSIS — Z510 Encounter for antineoplastic radiation therapy: Secondary | ICD-10-CM | POA: Diagnosis not present

## 2022-05-14 DIAGNOSIS — K1230 Oral mucositis (ulcerative), unspecified: Secondary | ICD-10-CM | POA: Diagnosis not present

## 2022-05-14 DIAGNOSIS — Z515 Encounter for palliative care: Secondary | ICD-10-CM | POA: Diagnosis not present

## 2022-05-14 DIAGNOSIS — C770 Secondary and unspecified malignant neoplasm of lymph nodes of head, face and neck: Secondary | ICD-10-CM | POA: Diagnosis not present

## 2022-05-14 DIAGNOSIS — Z5111 Encounter for antineoplastic chemotherapy: Secondary | ICD-10-CM | POA: Diagnosis not present

## 2022-05-14 DIAGNOSIS — Z20822 Contact with and (suspected) exposure to covid-19: Secondary | ICD-10-CM | POA: Diagnosis not present

## 2022-05-14 DIAGNOSIS — G893 Neoplasm related pain (acute) (chronic): Secondary | ICD-10-CM | POA: Diagnosis not present

## 2022-05-14 DIAGNOSIS — F17210 Nicotine dependence, cigarettes, uncomplicated: Secondary | ICD-10-CM | POA: Diagnosis not present

## 2022-05-14 LAB — RAD ONC ARIA SESSION SUMMARY
Course Elapsed Days: 48
Plan Fractions Treated to Date: 23
Plan Prescribed Dose Per Fraction: 2 Gy
Plan Total Fractions Prescribed: 35
Plan Total Prescribed Dose: 70 Gy
Reference Point Dosage Given to Date: 46 Gy
Reference Point Session Dosage Given: 2 Gy
Session Number: 23

## 2022-05-15 ENCOUNTER — Other Ambulatory Visit: Payer: Self-pay

## 2022-05-15 ENCOUNTER — Ambulatory Visit: Payer: Medicaid Other

## 2022-05-15 ENCOUNTER — Ambulatory Visit
Admission: RE | Admit: 2022-05-15 | Discharge: 2022-05-15 | Disposition: A | Discharge status: Home / Self Care | Payer: Medicaid Other | Source: Ambulatory Visit | Attending: Radiation Oncology | Admitting: Radiation Oncology

## 2022-05-15 DIAGNOSIS — Z5111 Encounter for antineoplastic chemotherapy: Secondary | ICD-10-CM | POA: Diagnosis not present

## 2022-05-15 DIAGNOSIS — F17210 Nicotine dependence, cigarettes, uncomplicated: Secondary | ICD-10-CM | POA: Diagnosis not present

## 2022-05-15 DIAGNOSIS — Z510 Encounter for antineoplastic radiation therapy: Secondary | ICD-10-CM | POA: Diagnosis not present

## 2022-05-15 DIAGNOSIS — Z7689 Persons encountering health services in other specified circumstances: Secondary | ICD-10-CM | POA: Diagnosis not present

## 2022-05-15 DIAGNOSIS — Z20822 Contact with and (suspected) exposure to covid-19: Secondary | ICD-10-CM | POA: Diagnosis not present

## 2022-05-15 DIAGNOSIS — Z515 Encounter for palliative care: Secondary | ICD-10-CM | POA: Diagnosis not present

## 2022-05-15 DIAGNOSIS — C770 Secondary and unspecified malignant neoplasm of lymph nodes of head, face and neck: Secondary | ICD-10-CM | POA: Diagnosis not present

## 2022-05-15 DIAGNOSIS — C099 Malignant neoplasm of tonsil, unspecified: Secondary | ICD-10-CM | POA: Diagnosis not present

## 2022-05-15 DIAGNOSIS — G9001 Carotid sinus syncope: Secondary | ICD-10-CM | POA: Diagnosis not present

## 2022-05-15 DIAGNOSIS — G893 Neoplasm related pain (acute) (chronic): Secondary | ICD-10-CM | POA: Diagnosis not present

## 2022-05-15 DIAGNOSIS — K1230 Oral mucositis (ulcerative), unspecified: Secondary | ICD-10-CM | POA: Diagnosis not present

## 2022-05-15 LAB — RAD ONC ARIA SESSION SUMMARY
Course Elapsed Days: 49
Plan Fractions Treated to Date: 24
Plan Prescribed Dose Per Fraction: 2 Gy
Plan Total Fractions Prescribed: 35
Plan Total Prescribed Dose: 70 Gy
Reference Point Dosage Given to Date: 48 Gy
Reference Point Session Dosage Given: 2 Gy
Session Number: 24

## 2022-05-15 MED FILL — Dexamethasone Sodium Phosphate Inj 100 MG/10ML: INTRAMUSCULAR | Qty: 1 | Status: AC

## 2022-05-16 ENCOUNTER — Ambulatory Visit: Payer: Medicaid Other

## 2022-05-16 ENCOUNTER — Other Ambulatory Visit: Payer: Self-pay

## 2022-05-16 ENCOUNTER — Ambulatory Visit
Admission: RE | Admit: 2022-05-16 | Discharge: 2022-05-16 | Disposition: A | Discharge status: Home / Self Care | Payer: Medicaid Other | Source: Ambulatory Visit | Attending: Radiation Oncology | Admitting: Radiation Oncology

## 2022-05-16 ENCOUNTER — Encounter: Payer: Self-pay | Admitting: Oncology

## 2022-05-16 ENCOUNTER — Inpatient Hospital Stay: Payer: Medicaid Other

## 2022-05-16 ENCOUNTER — Inpatient Hospital Stay (HOSPITAL_BASED_OUTPATIENT_CLINIC_OR_DEPARTMENT_OTHER): Payer: Medicaid Other | Admitting: Oncology

## 2022-05-16 ENCOUNTER — Ambulatory Visit: Payer: Medicaid Other | Admitting: Dietician

## 2022-05-16 DIAGNOSIS — H538 Other visual disturbances: Secondary | ICD-10-CM | POA: Insufficient documentation

## 2022-05-16 DIAGNOSIS — Z5111 Encounter for antineoplastic chemotherapy: Secondary | ICD-10-CM

## 2022-05-16 DIAGNOSIS — K1230 Oral mucositis (ulcerative), unspecified: Secondary | ICD-10-CM | POA: Diagnosis not present

## 2022-05-16 DIAGNOSIS — C770 Secondary and unspecified malignant neoplasm of lymph nodes of head, face and neck: Secondary | ICD-10-CM | POA: Diagnosis not present

## 2022-05-16 DIAGNOSIS — C099 Malignant neoplasm of tonsil, unspecified: Secondary | ICD-10-CM

## 2022-05-16 DIAGNOSIS — F17210 Nicotine dependence, cigarettes, uncomplicated: Secondary | ICD-10-CM | POA: Diagnosis not present

## 2022-05-16 DIAGNOSIS — G893 Neoplasm related pain (acute) (chronic): Secondary | ICD-10-CM | POA: Diagnosis not present

## 2022-05-16 DIAGNOSIS — M7989 Other specified soft tissue disorders: Secondary | ICD-10-CM

## 2022-05-16 DIAGNOSIS — Z510 Encounter for antineoplastic radiation therapy: Secondary | ICD-10-CM | POA: Diagnosis not present

## 2022-05-16 DIAGNOSIS — Z7689 Persons encountering health services in other specified circumstances: Secondary | ICD-10-CM | POA: Diagnosis not present

## 2022-05-16 DIAGNOSIS — B370 Candidal stomatitis: Secondary | ICD-10-CM | POA: Diagnosis not present

## 2022-05-16 DIAGNOSIS — Z515 Encounter for palliative care: Secondary | ICD-10-CM | POA: Diagnosis not present

## 2022-05-16 DIAGNOSIS — G9001 Carotid sinus syncope: Secondary | ICD-10-CM | POA: Diagnosis not present

## 2022-05-16 DIAGNOSIS — Z20822 Contact with and (suspected) exposure to covid-19: Secondary | ICD-10-CM | POA: Diagnosis not present

## 2022-05-16 LAB — CBC WITH DIFFERENTIAL/PLATELET
Abs Immature Granulocytes: 0.03 10*3/uL (ref 0.00–0.07)
Basophils Absolute: 0 10*3/uL (ref 0.0–0.1)
Basophils Relative: 1 %
Eosinophils Absolute: 0.1 10*3/uL (ref 0.0–0.5)
Eosinophils Relative: 1 %
HCT: 33.6 % — ABNORMAL LOW (ref 39.0–52.0)
Hemoglobin: 10.9 g/dL — ABNORMAL LOW (ref 13.0–17.0)
Immature Granulocytes: 1 %
Lymphocytes Relative: 10 %
Lymphs Abs: 0.6 10*3/uL — ABNORMAL LOW (ref 0.7–4.0)
MCH: 25 pg — ABNORMAL LOW (ref 26.0–34.0)
MCHC: 32.4 g/dL (ref 30.0–36.0)
MCV: 77.1 fL — ABNORMAL LOW (ref 80.0–100.0)
Monocytes Absolute: 0.5 10*3/uL (ref 0.1–1.0)
Monocytes Relative: 9 %
Neutro Abs: 4.5 10*3/uL (ref 1.7–7.7)
Neutrophils Relative %: 78 %
Platelets: 355 10*3/uL (ref 150–400)
RBC: 4.36 MIL/uL (ref 4.22–5.81)
RDW: 16.8 % — ABNORMAL HIGH (ref 11.5–15.5)
WBC: 5.7 10*3/uL (ref 4.0–10.5)
nRBC: 0 % (ref 0.0–0.2)

## 2022-05-16 LAB — COMPREHENSIVE METABOLIC PANEL
ALT: 10 U/L (ref 0–44)
AST: 12 U/L — ABNORMAL LOW (ref 15–41)
Albumin: 4.1 g/dL (ref 3.5–5.0)
Alkaline Phosphatase: 69 U/L (ref 38–126)
Anion gap: 7 (ref 5–15)
BUN: 8 mg/dL (ref 6–20)
CO2: 24 mmol/L (ref 22–32)
Calcium: 9 mg/dL (ref 8.9–10.3)
Chloride: 103 mmol/L (ref 98–111)
Creatinine, Ser: 0.78 mg/dL (ref 0.61–1.24)
GFR, Estimated: >60 mL/min (ref >60)
Glucose, Bld: 102 mg/dL — ABNORMAL HIGH (ref 70–99)
Potassium: 3.8 mmol/L (ref 3.5–5.1)
Sodium: 134 mmol/L — ABNORMAL LOW (ref 135–145)
Total Bilirubin: 0.6 mg/dL (ref 0.3–1.2)
Total Protein: 7.9 g/dL (ref 6.5–8.1)

## 2022-05-16 LAB — RAD ONC ARIA SESSION SUMMARY
Course Elapsed Days: 50
Plan Fractions Treated to Date: 25
Plan Prescribed Dose Per Fraction: 2 Gy
Plan Total Fractions Prescribed: 35
Plan Total Prescribed Dose: 70 Gy
Reference Point Dosage Given to Date: 50 Gy
Reference Point Session Dosage Given: 2 Gy
Session Number: 25

## 2022-05-16 MED ORDER — FAMOTIDINE IN NACL 20-0.9 MG/50ML-% IV SOLN
20.0000 mg | Freq: Once | INTRAVENOUS | Status: AC
  Administered 2022-05-16: 20 mg via INTRAVENOUS
  Filled 2022-05-16: qty 50

## 2022-05-16 MED ORDER — SODIUM CHLORIDE 0.9 % IV SOLN
INTRAVENOUS | Status: DC
  Administered 2022-05-16: 1000 mL via INTRAVENOUS
  Filled 2022-05-16 (×2): qty 250

## 2022-05-16 MED ORDER — HEPARIN SOD (PORK) LOCK FLUSH 100 UNIT/ML IV SOLN
500.0000 [IU] | Freq: Once | INTRAVENOUS | Status: AC | PRN
  Filled 2022-05-16: qty 5

## 2022-05-16 MED ORDER — PALONOSETRON HCL INJECTION 0.25 MG/5ML
0.2500 mg | Freq: Once | INTRAVENOUS | Status: AC
  Administered 2022-05-16: 0.25 mg via INTRAVENOUS
  Filled 2022-05-16: qty 5

## 2022-05-16 MED ORDER — SODIUM CHLORIDE 0.9 % IV SOLN
Freq: Once | INTRAVENOUS | Status: AC
  Filled 2022-05-16: qty 250

## 2022-05-16 MED ORDER — HEPARIN SOD (PORK) LOCK FLUSH 100 UNIT/ML IV SOLN
INTRAVENOUS | Status: AC
  Administered 2022-05-16: 500 [IU]
  Filled 2022-05-16: qty 5

## 2022-05-16 MED ORDER — SODIUM CHLORIDE 0.9 % IV SOLN
10.0000 mg | Freq: Once | INTRAVENOUS | Status: AC
  Administered 2022-05-16: 10 mg via INTRAVENOUS
  Filled 2022-05-16: qty 10

## 2022-05-16 MED ORDER — SODIUM CHLORIDE 0.9% FLUSH
10.0000 mL | Freq: Once | INTRAVENOUS | Status: AC
  Administered 2022-05-16: 10 mL via INTRAVENOUS
  Filled 2022-05-16: qty 10

## 2022-05-16 MED ORDER — SODIUM CHLORIDE 0.9 % IV SOLN
260.0000 mg | Freq: Once | INTRAVENOUS | Status: AC
  Administered 2022-05-16: 260 mg via INTRAVENOUS
  Filled 2022-05-16: qty 26

## 2022-05-16 NOTE — Assessment & Plan Note (Signed)
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 Recommend concurrent chemotherapy with radiation. Labs are reviewed and discussed with patient. Proceed with carboplatin AUC 2   

## 2022-05-16 NOTE — Progress Notes (Signed)
Nutrition Follow-up:   Called patient today in infusion.  He reports he's had some pain in throat and appetite is down but taste has improved. He says food are still tough to swallow foods and feel like they are sticking.  Has not been taking as many Ensure shakes, been doing about 3 per week. Bowels slow, but regular.     Medications: reviewed    Labs: reviewed   Anthropometrics: patient's weight fluctuating past 3 weeks   Weight: 05/16/22   161# 05/09/22  164.8# 05/02/22  162# 04/04/22 170 lb 03/27/22  168 lb 11.2 oz  UBW of 176 lb    NUTRITION DIAGNOSIS: Inadequate oral intake continues      INTERVENTION:    Encouraged high calorie drinks to lubricate foods as he eats Encourage 1-2 ONS daily, told him to ask for sample during treatment Encouraged beans and soft moist foods that he can add extra fats to make easier to swallow   MONITORING, EVALUATION, GOAL: weight trends, intake     NEXT VISIT: remote follow up during last chemo treatment   [name], RDN, LDN Registered Dietitian, [place] Part Time Remote (Usual office hours: Tuesday-Thursday) Mobile: [phone]

## 2022-05-16 NOTE — Patient Instructions (Signed)
[facility]  Discharge Instructions: Thank you for choosing [facility] to provide your oncology and hematology care.  If you have a lab appointment with the Cancer Center, please go directly to the Cancer Center and check in at the registration area.  Wear comfortable clothing and clothing appropriate for easy access to any Portacath or PICC line.   We strive to give you quality time with your provider. You may need to reschedule your appointment if you arrive late (15 or more minutes).  Arriving late affects you and other patients whose appointments are after yours.  Also, if you miss three or more appointments without notifying the office, you may be dismissed from the clinic at the provider's discretion.      For prescription refill requests, have your pharmacy contact our office and allow 72 hours for refills to be completed.    Today you received the following chemotherapy and/or immunotherapy agents: Carboplatin      To help prevent nausea and vomiting after your treatment, we encourage you to take your nausea medication as directed.  BELOW ARE SYMPTOMS THAT SHOULD BE REPORTED IMMEDIATELY: *FEVER GREATER THAN 100.4 F (38 C) OR HIGHER *CHILLS OR SWEATING *NAUSEA AND VOMITING THAT IS NOT CONTROLLED WITH YOUR NAUSEA MEDICATION *UNUSUAL SHORTNESS OF BREATH *UNUSUAL BRUISING OR BLEEDING *URINARY PROBLEMS (pain or burning when urinating, or frequent urination) *BOWEL PROBLEMS (unusual diarrhea, constipation, pain near the anus) TENDERNESS IN MOUTH AND THROAT WITH OR WITHOUT PRESENCE OF ULCERS (sore throat, sores in mouth, or a toothache) UNUSUAL RASH, SWELLING OR PAIN  UNUSUAL VAGINAL DISCHARGE OR ITCHING   Items with * indicate a potential emergency and should be followed up as soon as possible or go to the Emergency Department if any problems should occur.  Please show the CHEMOTHERAPY ALERT CARD or IMMUNOTHERAPY ALERT CARD at check-in  to the Emergency Department and triage nurse.  Should you have questions after your visit or need to cancel or reschedule your appointment, please contact [facility]  [phone] and follow the prompts.  Office hours are 8:00 a.m. to 4:30 p.m. Monday - Friday. Please note that voicemails left after 4:00 p.m. may not be returned until the following business day.  We are closed weekends and major holidays. You have access to a nurse at all times for urgent questions. Please call the main number to the clinic [phone] and follow the prompts.  For any non-urgent questions, you may also contact your provider using MyChart. We now offer e-Visits for anyone 18 and older to request care online for non-urgent symptoms. For details visit [email or web address].   Also download the MyChart app! Go to the app store, search "MyChart", open the app, select [facility], and log in with your MyChart username and password.  Masks are optional in the cancer centers. If you would like for your care team to wear a mask while they are taking care of you, please let them know. For doctor visits, patients may have with them one support person who is at least 59 years old. At this time, visitors are not allowed in the infusion area.   

## 2022-05-16 NOTE — Assessment & Plan Note (Signed)
Improved. Continue prophylactic Nystatin mouth wash swish and swallow. 

## 2022-05-16 NOTE — Assessment & Plan Note (Signed)
Right upper arm soft tissue mass, cystic   check [place]

## 2022-05-16 NOTE — Assessment & Plan Note (Signed)
Treatment plan as listed above. 

## 2022-05-16 NOTE — Progress Notes (Signed)
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 Recommend concurrent chemotherapy with radiation. Labs are reviewed and discussed with patient. Proceed with carboplatin AUC 2    Encounter for antineoplastic chemotherapy Treatment plan as listed above  Thrush Improved. Continue prophylactic Nystatin mouth wash swish and swallow.  Mass of soft tissue Right upper arm soft tissue mass, cystic   check [place]  Blurred vision Last 1-2 days after chemo.  Possibly chemo related.  Monitor. If worse will refer to ophthalmologist.    Orders Placed This Encounter  Procedures   [place] RT UPPER EXTREM LTD SOFT TISSUE NON VASCULAR    Standing Status:   Future    Standing Expiration Date:   05/17/2023    Order Specific Question:   Reason for Exam (SYMPTOM  OR DIAGNOSIS REQUIRED)    Answer:   Right arm soft tissue mass    Order Specific Question:   Preferred imaging location?    Answer:   [facility]     Follow up 1 week lab MD carboplatin  All questions were answered. The patient knows to call the clinic with any problems, questions or concerns. No barriers to learning was detected.  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 male presents to establish  care for  I have reviewed his chart and materials related to his cancer extensively and collaborated history with the patient. 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 This likely reflects an enlarged lymph node.  2 Abnormal soft tissue surrounding the mid-to-distal cervical right ICA with resultant mild vessel narrowing 3 Atherosclerotic plaque within the bilateral common carotid and cervical internal carotid arteries without hemodynamically significant stenosis. 4 Vertebral arteries patent within the neck. Mild-to-moderate atherosclerotic narrowing at the origin of the right vertebral artery. No more than mild atherosclerotic stenosis at the origin of the left vertebral artery. 5 Aortic Atherosclerosis and Emphysema 6. Cervical spondylosis   02/27/2022 Imaging   MRI neck soft tissue w wo Constellation of findings most compatible with a roughly 2.4 cm Right Tonsillar Carcinoma (series 12, image 11) with associated right level 2 and level 3 malignant nodal, ex-nodal disease (series 14, image 13). Recommend ENT consultation    02/27/2022 Imaging   02/27/2022 MRI brain without contrast showed  No acute intracranial abnormality. Abnormal right neck soft tissues. See dedicated Neck MRI today reported separately. 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 These are slow flow vascular malformations which are generally asymptomatic. [name] Other moderately advanced but nonspecific cerebral white  matter signal changes.   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 Metastatic squamous cell  carcinoma. Moderately differentiated with focal keratinization.  p16 negative.    02/28/2022 Cancer Staging   Staging form: Pharynx - P16 Negative Oropharynx, AJCC 8th Edition - Clinical stage from 02/28/2022: Stage IVA (cT2, cN2b, cM0, p16-) - Signed by [name], MD on 03/22/2022 Stage prefix: Initial diagnosis   03/19/2022 Imaging   PET scan showed Signs of RIGHT neck malignancy with metastatic involvement just below the skull base and at level II/III also on the RIGHT. No signs of contralateral nodal involvement.   03/28/2022 -  Chemotherapy   Patient is on Treatment Plan : HEAD/NECK Carboplatin (AUC 2) q7d      Patient initially presented emergency room due to intermittent syncope/near syncope episodes, diaphoretic, nauseated. Imaging findings are concerning for right tonsil mass with level 2/level 3 lymph node involvement which contributes to syncope/near syncope episodes -carotid sinus syndrome. 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  COVID-19 infection.  Chemotherapy radiation was held for 2 weeks.    INTERVAL HISTORY Clarence Skinner is a 61 y.o. male who has above history reviewed by me today presents for follow up visit for management of stage IVa right tonsil squamous cell carcinoma Decrease of taste, hard to swallow, no dysphagia. [name] is better.  No fever, chills, nausea vomiting.  + right upper extremity chronic mass, he has noticed for months.  + weight loss.  + blurred vision for 1-2 days after chemo, spontaneously resolved.   MEDICAL HISTORY:  Past Medical History:  Diagnosis Date   COPD (chronic obstructive pulmonary disease) (HCC)    GERD (gastroesophageal reflux disease)    Gout    Hypertension    Multilevel degenerative disc disease    Pneumonia 11/29/2017   Sleep apnea    CPAP   Tachycardia    Wears dentures    partial upper and lower    SURGICAL HISTORY: Past Surgical History:  Procedure Laterality Date   ABCESS DRAINAGE  2009    tonsil   COLONOSCOPY WITH PROPOFOL N/A 02/11/2018   Procedure: COLONOSCOPY WITH PROPOFOL;  Surgeon: Toledo, Benay Pike, MD;  Location: [facility];  Service: Endoscopy;  Laterality: N/A;   ESOPHAGOGASTRODUODENOSCOPY (EGD) WITH PROPOFOL N/A 02/11/2018   Procedure: ESOPHAGOGASTRODUODENOSCOPY (EGD) WITH PROPOFOL;  Surgeon: Toledo, Benay Pike, MD;  Location: [facility];  Service: Endoscopy;  Laterality: N/A;   IR IMAGING GUIDED PORT INSERTION  04/09/2022   MASS EXCISION Right 08/14/2017   Procedure: EXCISION RIGHT TEMPORAL MASS SUBMENTAL MASS;  Surgeon: Carloyn Manner, MD;  Location: [facility];  Service: ENT;  Laterality: Right;  sleep apnea   RADIOACTIVE SEED IMPLANT N/A 01/26/2019   Procedure: RADIOACTIVE SEED IMPLANT/BRACHYTHERAPY IMPLANT;  Surgeon: Billey Co, MD;  Location: [facility];  Service: Urology;  Laterality: N/A;   SEPTOPLASTY  2016   [place]   VOLUME STUDY N/A 12/29/2018   Procedure: VOLUME STUDY;  Surgeon: Billey Co, MD;  Location: [facility];  Service: Urology;  Laterality: N/A;    SOCIAL HISTORY: Social History   Socioeconomic History   Marital status: Significant Other    Spouse name: Not on file   Number of children: Not on file   Years of education: Not on file   Highest education level: Not on file  Occupational History   Not on file  Tobacco Use   Smoking status: Every Day  Packs/day: 1.00    Years: 30.00    Total pack years: 30.00    Types: Cigarettes   Smokeless tobacco: Former    Types: Snuff, Chew    Quit date: 12/05/1984   Tobacco comments:    since age 29  Vaping Use   Vaping Use: Never used  Substance and Sexual Activity   Alcohol use: Yes    Alcohol/week: 12.0 standard drinks of alcohol    Types: 12 Cans of beer per week    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   Drug use: Not Currently   Sexual activity: Yes  Other Topics Concern   Not on file  Social History Narrative   Not on file   Social  Determinants of Health   Financial Resource Strain: High Risk (04/03/2022)   Overall Financial Resource Strain (CARDIA)    Difficulty of Paying Living Expenses: Hard  Food Insecurity: Food Insecurity Present (04/03/2022)   Hunger Vital Sign    Worried About Running Out of Food in the Last Year: Often true    Ran Out of Food in the Last Year: Often true  Transportation Needs: Unmet Transportation Needs (04/03/2022)   PRAPARE - Hydrologist (Medical): Yes    Lack of Transportation (Non-Medical): Yes  Physical Activity: Inactive (04/03/2022)   Exercise Vital Sign    Days of Exercise per Week: 0 days    Minutes of Exercise per Session: 0 min  Stress: Stress Concern Present (04/03/2022)   [place]    Feeling of Stress : Very much  Social Connections: Moderately Isolated (04/03/2022)   Social Connection and Isolation Panel [NHANES]    Frequency of Communication with Friends and Family: Never    Frequency of Social Gatherings with Friends and Family: Three times a week    Attends Religious Services: Never    Active Member of Clubs or Organizations: No    Attends Archivist Meetings: Not on file    Marital Status: Living with partner  Intimate Partner Violence: Not At Risk (04/03/2022)   Humiliation, Afraid, Rape, and Kick questionnaire    Fear of Current or Ex-Partner: No    Emotionally Abused: No    Physically Abused: No    Sexually Abused: No    FAMILY HISTORY: Family History  Problem Relation Age of Onset   Anxiety disorder Mother    Cancer Father    Hypertension Sister    Diabetes Sister    Asthma Sister    Other Brother        mva  at age 24   Gout Brother    Hypertension Brother    Prostate cancer Maternal Uncle    Prostate cancer Maternal Uncle     ALLERGIES:  is allergic to chocolate, eggs or egg-derived products, milk (cow), benadryl [diphenhydramine], and  milk-related compounds.  MEDICATIONS:  Current Outpatient Medications  Medication Sig Dispense Refill   sucralfate (CARAFATE) 1 GM/10ML suspension Take 10 mLs (1 g total) by mouth 4 (four) times daily -  with meals and at bedtime. 420 mL 0   albuterol (PROVENTIL HFA;VENTOLIN HFA) 108 (90 Base) MCG/ACT inhaler Inhale into the lungs every 6 (six) hours as needed for wheezing or shortness of breath.     allopurinol (ZYLOPRIM) 300 MG tablet Take 300 mg by mouth daily.     amLODipine-benazepril (LOTREL) 10-40 MG capsule Take 1 capsule by mouth daily.  Azelastine HCl 137 MCG/SPRAY SOLN PLACE 1 SPRAY INTO THE NOSE 2 (TWO) TIMES DAILY AS NEEDED. 30 mL 1   baclofen (LIORESAL) 10 MG tablet Take 10 mg by mouth 2 (two) times daily as needed.     benzonatate (TESSALON) 100 MG capsule Take 1 capsule (100 mg total) by mouth every 8 (eight) hours as needed. 30 capsule 0   docusate sodium (COLACE) 100 MG capsule Take 100 mg by mouth daily as needed.     fluticasone (FLONASE) 50 MCG/ACT nasal spray Place into both nostrils daily as needed for allergies or rhinitis.     Fluticasone-Umeclidin-Vilant (TRELEGY ELLIPTA) 100-62.5-25 MCG/ACT AEPB Trelegy Ellipta 100-62.5-25 MCG/INH Inhalation Aerosol Powder Breath Activated QTY: 90 each Days: 90 Refills: 1  Written: 11/02/20 Patient Instructions: One puff once a day     furosemide (LASIX) 20 MG tablet Take 20 mg by mouth daily.     gabapentin (NEURONTIN) 100 MG capsule Take 1 capsule (100 mg total) by mouth at bedtime. 30 capsule 1   ibuprofen (ADVIL) 800 MG tablet SMARTSIG:1 Tablet(s) By Mouth Every 12 Hours PRN     lidocaine-prilocaine (EMLA) cream Apply 1 Application topically as directed. Apply small amount of cream to port a cath site 1-2 hours prior to port being accessed. 30 g 2   loratadine (CLARITIN) 10 MG tablet Take 10 mg by mouth daily.     magic mouthwash (multi-ingredient) oral suspension Swish and spit with 5 to 10 mL by mouth four times a day as needed  for mouth pain 480 mL 1   magic mouthwash w/lidocaine SOLN Take 5 mLs by mouth 4 (four) times daily as needed for mouth pain. Sig: Swish/Spit 5-10 ml four times a day as needed. Dispense 480 ml. 1RF 480 mL 1   meloxicam (MOBIC) 7.5 MG tablet Take 7.5 mg by mouth daily as needed for pain. (Patient not taking: Reported on 05/09/2022)     nystatin (MYCOSTATIN) 100000 UNIT/ML suspension Take 5 mLs (500,000 Units total) by mouth 4 (four) times daily. Swish and swallow 473 mL 0   Oxycodone HCl 10 MG TABS Take 10 mg by mouth every 6 (six) hours as needed.     pantoprazole (PROTONIX) 40 MG tablet Take 40 mg by mouth daily.     potassium chloride SA (KLOR-CON M) 20 MEQ tablet Take 1 tablet (20 mEq total) by mouth daily. (Patient not taking: Reported on 05/09/2022) 3 tablet 0   prednisoLONE (ORAPRED) 15 MG/5ML solution Take by mouth.     prochlorperazine (COMPAZINE) 10 MG tablet Take 1 tablet (10 mg total) by mouth every 6 (six) hours as needed for nausea or vomiting. 30 tablet 1   rosuvastatin (CRESTOR) 20 MG tablet Take 1 tablet (20 mg total) by mouth daily. 30 tablet 5   senna (SENOKOT) 8.6 MG TABS tablet Take 1 tablet (8.6 mg total) by mouth daily. 120 tablet 0   SYMBICORT 80-4.5 MCG/ACT inhaler Inhale 1 puff into the lungs 2 (two) times daily.     tadalafil (CIALIS) 20 MG tablet Take 1 tablet (20 mg total) by mouth daily. (Patient not taking: Reported on 05/02/2022) 30 tablet 11   Vitamin D, Ergocalciferol, (DRISDOL) 1.25 MG (50000 UNIT) CAPS capsule Take 50,000 Units by mouth once a week.     No current facility-administered medications for this visit.   Facility-Administered Medications Ordered in Other Visits  Medication Dose Route Frequency Provider Last Rate Last Admin   0.9 %  sodium chloride infusion   Intravenous Continuous  [name], MD   Stopped at 05/16/22 1137   sodium chloride flush (NS) 0.9 % injection 10 mL  10 mL Intracatheter PRN [name], MD   10 mL at 04/11/22 1221    Review of  Systems  Constitutional:  Positive for fatigue and unexpected weight change. Negative for chills and fever.  HENT:   Positive for hearing loss. Negative for voice change.   Eyes:  Negative for eye problems and icterus.       Blurred vision  Respiratory:  Negative for chest tightness, cough and shortness of breath.   Cardiovascular:  Negative for chest pain and leg swelling.  Gastrointestinal:  Negative for abdominal distention and abdominal pain.  Endocrine: Negative for hot flashes.  Genitourinary:  Negative for difficulty urinating, dysuria and frequency.   Musculoskeletal:  Positive for arthralgias and neck pain.  Skin:  Negative for itching and rash.  Neurological:  Negative for light-headedness and numbness.  Hematological:  Negative for adenopathy. Does not bruise/bleed easily.  Psychiatric/Behavioral:  Negative for confusion.      PHYSICAL EXAMINATION: ECOG PERFORMANCE STATUS: 1 - Symptomatic but completely ambulatory  Vitals:   05/16/22 0905  BP: 136/81  Pulse: 84  Temp: (!) 96.5 F (35.8 C)  SpO2: 99%   Filed Weights   05/16/22 0905  Weight: 161 lb (73 kg)    Physical Exam Constitutional:      General: He is not in acute distress.    Appearance: He is not diaphoretic.  HENT:     Head: Normocephalic and atraumatic.     Nose: Nose normal.     Mouth/Throat:     Pharynx: No oropharyngeal exudate.  Eyes:     General: No scleral icterus.    Pupils: Pupils are equal, round, and reactive to light.  Cardiovascular:     Rate and Rhythm: Normal rate and regular rhythm.     Heart sounds: No murmur heard. Pulmonary:     Effort: Pulmonary effort is normal. No respiratory distress.     Breath sounds: No rales.  Chest:     Chest wall: No tenderness.  Abdominal:     General: There is no distension.     Palpations: Abdomen is soft.     Tenderness: There is no abdominal tenderness.  Musculoskeletal:        General: Normal range of motion.     Cervical back: Normal  range of motion and neck supple.  Skin:    General: Skin is warm and dry.     Findings: No erythema.  Neurological:     Mental Status: He is alert and oriented to person, place, and time.     Cranial Nerves: No cranial nerve deficit.     Motor: No abnormal muscle tone.     Coordination: Coordination normal.  Psychiatric:        Mood and Affect: Mood and affect normal.      LABORATORY DATA:  I have reviewed the data as listed    Latest Ref Rng & Units 05/16/2022    8:52 AM 05/09/2022    9:05 AM 05/02/2022    9:05 AM  CBC  WBC 4.0 - 10.5 K/uL 5.7  5.0  4.7   Hemoglobin 13.0 - 17.0 g/dL 10.9  10.7  11.9   Hematocrit 39.0 - 52.0 % 33.6  33.0  36.6   Platelets 150 - 400 K/uL 355  252  270       Latest Ref Rng & Units 05/16/2022  8:52 AM 05/09/2022    9:05 AM 05/02/2022    9:05 AM  CMP  Glucose 70 - 99 mg/dL 102  104  100   BUN 6 - 20 mg/dL '8  10  9   '$ Creatinine 0.61 - 1.24 mg/dL 0.78  0.74  0.81   Sodium 135 - 145 mmol/L 134  133  138   Potassium 3.5 - 5.1 mmol/L 3.8  3.7  3.3   Chloride 98 - 111 mmol/L 103  102  105   CO2 22 - 32 mmol/L '24  24  27   '$ Calcium 8.9 - 10.3 mg/dL 9.0  8.9  9.0   Total Protein 6.5 - 8.1 g/dL 7.9  7.4  7.8   Total Bilirubin 0.3 - 1.2 mg/dL 0.6  0.5  0.4   Alkaline Phos 38 - 126 U/L 69  66  68   AST 15 - 41 U/L '12  14  16   '$ ALT 0 - 44 U/L '10  13  18       '$ RADIOGRAPHIC STUDIES: I have personally reviewed the radiological images as listed and agreed with the findings in the report. No results found.

## 2022-05-16 NOTE — Assessment & Plan Note (Signed)
Last 1-2 days after chemo.  Possibly chemo related.  Monitor. If worse will refer to ophthalmologist.

## 2022-05-17 ENCOUNTER — Other Ambulatory Visit: Payer: Self-pay

## 2022-05-17 ENCOUNTER — Ambulatory Visit
Admission: RE | Admit: 2022-05-17 | Discharge: 2022-05-17 | Disposition: A | Discharge status: Home / Self Care | Payer: Medicaid Other | Source: Ambulatory Visit | Attending: Radiation Oncology | Admitting: Radiation Oncology

## 2022-05-17 DIAGNOSIS — Z510 Encounter for antineoplastic radiation therapy: Secondary | ICD-10-CM | POA: Diagnosis not present

## 2022-05-17 DIAGNOSIS — G893 Neoplasm related pain (acute) (chronic): Secondary | ICD-10-CM | POA: Diagnosis not present

## 2022-05-17 DIAGNOSIS — Z5111 Encounter for antineoplastic chemotherapy: Secondary | ICD-10-CM | POA: Diagnosis not present

## 2022-05-17 DIAGNOSIS — K1230 Oral mucositis (ulcerative), unspecified: Secondary | ICD-10-CM | POA: Diagnosis not present

## 2022-05-17 DIAGNOSIS — Z515 Encounter for palliative care: Secondary | ICD-10-CM | POA: Diagnosis not present

## 2022-05-17 DIAGNOSIS — Z7689 Persons encountering health services in other specified circumstances: Secondary | ICD-10-CM | POA: Diagnosis not present

## 2022-05-17 DIAGNOSIS — G9001 Carotid sinus syncope: Secondary | ICD-10-CM | POA: Diagnosis not present

## 2022-05-17 DIAGNOSIS — C770 Secondary and unspecified malignant neoplasm of lymph nodes of head, face and neck: Secondary | ICD-10-CM | POA: Diagnosis not present

## 2022-05-17 DIAGNOSIS — Z20822 Contact with and (suspected) exposure to covid-19: Secondary | ICD-10-CM | POA: Diagnosis not present

## 2022-05-17 DIAGNOSIS — C099 Malignant neoplasm of tonsil, unspecified: Secondary | ICD-10-CM | POA: Diagnosis not present

## 2022-05-17 DIAGNOSIS — F17210 Nicotine dependence, cigarettes, uncomplicated: Secondary | ICD-10-CM | POA: Diagnosis not present

## 2022-05-17 LAB — RAD ONC ARIA SESSION SUMMARY
Course Elapsed Days: 51
Plan Fractions Treated to Date: 26
Plan Prescribed Dose Per Fraction: 2 Gy
Plan Total Fractions Prescribed: 35
Plan Total Prescribed Dose: 70 Gy
Reference Point Dosage Given to Date: 52 Gy
Reference Point Session Dosage Given: 2 Gy
Session Number: 26

## 2022-05-18 ENCOUNTER — Other Ambulatory Visit: Payer: Self-pay

## 2022-05-18 ENCOUNTER — Ambulatory Visit
Admission: RE | Admit: 2022-05-18 | Discharge: 2022-05-18 | Disposition: A | Discharge status: Home / Self Care | Payer: Medicaid Other | Source: Ambulatory Visit | Attending: Radiation Oncology | Admitting: Radiation Oncology

## 2022-05-18 DIAGNOSIS — G9001 Carotid sinus syncope: Secondary | ICD-10-CM | POA: Diagnosis not present

## 2022-05-18 DIAGNOSIS — Z5111 Encounter for antineoplastic chemotherapy: Secondary | ICD-10-CM | POA: Diagnosis not present

## 2022-05-18 DIAGNOSIS — Z510 Encounter for antineoplastic radiation therapy: Secondary | ICD-10-CM | POA: Diagnosis not present

## 2022-05-18 DIAGNOSIS — K1230 Oral mucositis (ulcerative), unspecified: Secondary | ICD-10-CM | POA: Diagnosis not present

## 2022-05-18 DIAGNOSIS — G893 Neoplasm related pain (acute) (chronic): Secondary | ICD-10-CM | POA: Diagnosis not present

## 2022-05-18 DIAGNOSIS — C770 Secondary and unspecified malignant neoplasm of lymph nodes of head, face and neck: Secondary | ICD-10-CM | POA: Diagnosis not present

## 2022-05-18 DIAGNOSIS — C099 Malignant neoplasm of tonsil, unspecified: Secondary | ICD-10-CM | POA: Diagnosis not present

## 2022-05-18 DIAGNOSIS — F17210 Nicotine dependence, cigarettes, uncomplicated: Secondary | ICD-10-CM | POA: Diagnosis not present

## 2022-05-18 DIAGNOSIS — Z20822 Contact with and (suspected) exposure to covid-19: Secondary | ICD-10-CM | POA: Diagnosis not present

## 2022-05-18 DIAGNOSIS — Z515 Encounter for palliative care: Secondary | ICD-10-CM | POA: Diagnosis not present

## 2022-05-18 LAB — RAD ONC ARIA SESSION SUMMARY
Course Elapsed Days: 52
Plan Fractions Treated to Date: 27
Plan Prescribed Dose Per Fraction: 2 Gy
Plan Total Fractions Prescribed: 35
Plan Total Prescribed Dose: 70 Gy
Reference Point Dosage Given to Date: 54 Gy
Reference Point Session Dosage Given: 2 Gy
Session Number: 27

## 2022-05-21 ENCOUNTER — Ambulatory Visit
Admission: RE | Admit: 2022-05-21 | Discharge: 2022-05-21 | Disposition: A | Discharge status: Home / Self Care | Payer: Medicaid Other | Source: Ambulatory Visit | Attending: Radiation Oncology | Admitting: Radiation Oncology

## 2022-05-21 ENCOUNTER — Other Ambulatory Visit: Payer: Self-pay

## 2022-05-21 DIAGNOSIS — F17210 Nicotine dependence, cigarettes, uncomplicated: Secondary | ICD-10-CM | POA: Diagnosis not present

## 2022-05-21 DIAGNOSIS — G9001 Carotid sinus syncope: Secondary | ICD-10-CM | POA: Diagnosis not present

## 2022-05-21 DIAGNOSIS — Z20822 Contact with and (suspected) exposure to covid-19: Secondary | ICD-10-CM | POA: Diagnosis not present

## 2022-05-21 DIAGNOSIS — C099 Malignant neoplasm of tonsil, unspecified: Secondary | ICD-10-CM | POA: Diagnosis not present

## 2022-05-21 DIAGNOSIS — Z515 Encounter for palliative care: Secondary | ICD-10-CM | POA: Diagnosis not present

## 2022-05-21 DIAGNOSIS — G893 Neoplasm related pain (acute) (chronic): Secondary | ICD-10-CM | POA: Diagnosis not present

## 2022-05-21 DIAGNOSIS — Z5111 Encounter for antineoplastic chemotherapy: Secondary | ICD-10-CM | POA: Diagnosis not present

## 2022-05-21 DIAGNOSIS — Z7689 Persons encountering health services in other specified circumstances: Secondary | ICD-10-CM | POA: Diagnosis not present

## 2022-05-21 DIAGNOSIS — K1230 Oral mucositis (ulcerative), unspecified: Secondary | ICD-10-CM | POA: Diagnosis not present

## 2022-05-21 DIAGNOSIS — Z510 Encounter for antineoplastic radiation therapy: Secondary | ICD-10-CM | POA: Diagnosis not present

## 2022-05-21 DIAGNOSIS — C770 Secondary and unspecified malignant neoplasm of lymph nodes of head, face and neck: Secondary | ICD-10-CM | POA: Diagnosis not present

## 2022-05-21 LAB — RAD ONC ARIA SESSION SUMMARY
Course Elapsed Days: 55
Plan Fractions Treated to Date: 28
Plan Prescribed Dose Per Fraction: 2 Gy
Plan Total Fractions Prescribed: 35
Plan Total Prescribed Dose: 70 Gy
Reference Point Dosage Given to Date: 56 Gy
Reference Point Session Dosage Given: 2 Gy
Session Number: 28

## 2022-05-22 ENCOUNTER — Other Ambulatory Visit: Payer: Self-pay

## 2022-05-22 ENCOUNTER — Ambulatory Visit
Admission: RE | Admit: 2022-05-22 | Discharge: 2022-05-22 | Disposition: A | Discharge status: Home / Self Care | Payer: Medicaid Other | Source: Ambulatory Visit | Attending: Radiation Oncology | Admitting: Radiation Oncology

## 2022-05-22 ENCOUNTER — Other Ambulatory Visit: Payer: Self-pay | Admitting: *Deleted

## 2022-05-22 ENCOUNTER — Ambulatory Visit: Payer: Medicaid Other

## 2022-05-22 DIAGNOSIS — Z5111 Encounter for antineoplastic chemotherapy: Secondary | ICD-10-CM | POA: Diagnosis not present

## 2022-05-22 DIAGNOSIS — C099 Malignant neoplasm of tonsil, unspecified: Secondary | ICD-10-CM | POA: Diagnosis not present

## 2022-05-22 DIAGNOSIS — Z510 Encounter for antineoplastic radiation therapy: Secondary | ICD-10-CM | POA: Diagnosis not present

## 2022-05-22 DIAGNOSIS — K1230 Oral mucositis (ulcerative), unspecified: Secondary | ICD-10-CM | POA: Diagnosis not present

## 2022-05-22 DIAGNOSIS — Z7689 Persons encountering health services in other specified circumstances: Secondary | ICD-10-CM | POA: Diagnosis not present

## 2022-05-22 DIAGNOSIS — C770 Secondary and unspecified malignant neoplasm of lymph nodes of head, face and neck: Secondary | ICD-10-CM | POA: Diagnosis not present

## 2022-05-22 DIAGNOSIS — Z515 Encounter for palliative care: Secondary | ICD-10-CM | POA: Diagnosis not present

## 2022-05-22 DIAGNOSIS — G893 Neoplasm related pain (acute) (chronic): Secondary | ICD-10-CM | POA: Diagnosis not present

## 2022-05-22 DIAGNOSIS — Z20822 Contact with and (suspected) exposure to covid-19: Secondary | ICD-10-CM | POA: Diagnosis not present

## 2022-05-22 DIAGNOSIS — G9001 Carotid sinus syncope: Secondary | ICD-10-CM | POA: Diagnosis not present

## 2022-05-22 DIAGNOSIS — F17210 Nicotine dependence, cigarettes, uncomplicated: Secondary | ICD-10-CM | POA: Diagnosis not present

## 2022-05-22 LAB — RAD ONC ARIA SESSION SUMMARY
Course Elapsed Days: 56
Plan Fractions Treated to Date: 29
Plan Prescribed Dose Per Fraction: 2 Gy
Plan Total Fractions Prescribed: 35
Plan Total Prescribed Dose: 70 Gy
Reference Point Dosage Given to Date: 58 Gy
Reference Point Session Dosage Given: 2 Gy
Session Number: 29

## 2022-05-22 MED ORDER — DEXAMETHASONE 4 MG PO TABS: 2.0000 mg | ORAL_TABLET | Freq: Two times a day (BID) | ORAL | 0 refills | Status: DC

## 2022-05-22 MED FILL — Dexamethasone Sodium Phosphate Inj 100 MG/10ML: INTRAMUSCULAR | Qty: 1 | Status: AC

## 2022-05-23 ENCOUNTER — Ambulatory Visit
Admission: RE | Admit: 2022-05-23 | Discharge: 2022-05-23 | Disposition: A | Discharge status: Home / Self Care | Payer: Medicaid Other | Source: Ambulatory Visit | Attending: Radiation Oncology | Admitting: Radiation Oncology

## 2022-05-23 ENCOUNTER — Encounter: Payer: Self-pay | Admitting: Oncology

## 2022-05-23 ENCOUNTER — Inpatient Hospital Stay (HOSPITAL_BASED_OUTPATIENT_CLINIC_OR_DEPARTMENT_OTHER): Payer: Medicaid Other | Admitting: Oncology

## 2022-05-23 ENCOUNTER — Other Ambulatory Visit: Payer: Self-pay

## 2022-05-23 ENCOUNTER — Inpatient Hospital Stay: Payer: Medicaid Other | Admitting: Occupational Therapy

## 2022-05-23 ENCOUNTER — Inpatient Hospital Stay: Payer: Medicaid Other | Attending: Oncology

## 2022-05-23 ENCOUNTER — Inpatient Hospital Stay: Payer: Medicaid Other

## 2022-05-23 ENCOUNTER — Ambulatory Visit: Payer: Medicaid Other

## 2022-05-23 DIAGNOSIS — Z515 Encounter for palliative care: Secondary | ICD-10-CM | POA: Insufficient documentation

## 2022-05-23 DIAGNOSIS — K1230 Oral mucositis (ulcerative), unspecified: Secondary | ICD-10-CM

## 2022-05-23 DIAGNOSIS — C099 Malignant neoplasm of tonsil, unspecified: Secondary | ICD-10-CM | POA: Diagnosis not present

## 2022-05-23 DIAGNOSIS — G9001 Carotid sinus syncope: Secondary | ICD-10-CM | POA: Insufficient documentation

## 2022-05-23 DIAGNOSIS — B370 Candidal stomatitis: Secondary | ICD-10-CM | POA: Diagnosis not present

## 2022-05-23 DIAGNOSIS — F17210 Nicotine dependence, cigarettes, uncomplicated: Secondary | ICD-10-CM | POA: Insufficient documentation

## 2022-05-23 DIAGNOSIS — Z419 Encounter for procedure for purposes other than remedying health state, unspecified: Secondary | ICD-10-CM | POA: Diagnosis not present

## 2022-05-23 DIAGNOSIS — I10 Essential (primary) hypertension: Secondary | ICD-10-CM | POA: Insufficient documentation

## 2022-05-23 DIAGNOSIS — C770 Secondary and unspecified malignant neoplasm of lymph nodes of head, face and neck: Secondary | ICD-10-CM | POA: Insufficient documentation

## 2022-05-23 DIAGNOSIS — G893 Neoplasm related pain (acute) (chronic): Secondary | ICD-10-CM | POA: Insufficient documentation

## 2022-05-23 DIAGNOSIS — Z5111 Encounter for antineoplastic chemotherapy: Secondary | ICD-10-CM

## 2022-05-23 DIAGNOSIS — Z20822 Contact with and (suspected) exposure to covid-19: Secondary | ICD-10-CM | POA: Insufficient documentation

## 2022-05-23 DIAGNOSIS — Z510 Encounter for antineoplastic radiation therapy: Secondary | ICD-10-CM | POA: Diagnosis not present

## 2022-05-23 DIAGNOSIS — M542 Cervicalgia: Secondary | ICD-10-CM

## 2022-05-23 LAB — RAD ONC ARIA SESSION SUMMARY
Course Elapsed Days: 57
Plan Fractions Treated to Date: 30
Plan Prescribed Dose Per Fraction: 2 Gy
Plan Total Fractions Prescribed: 35
Plan Total Prescribed Dose: 70 Gy
Reference Point Dosage Given to Date: 60 Gy
Reference Point Session Dosage Given: 2 Gy
Session Number: 30

## 2022-05-23 LAB — COMPREHENSIVE METABOLIC PANEL
ALT: 12 U/L (ref 0–44)
AST: 13 U/L — ABNORMAL LOW (ref 15–41)
Albumin: 3.9 g/dL (ref 3.5–5.0)
Alkaline Phosphatase: 67 U/L (ref 38–126)
Anion gap: 6 (ref 5–15)
BUN: 8 mg/dL (ref 6–20)
CO2: 24 mmol/L (ref 22–32)
Calcium: 8.9 mg/dL (ref 8.9–10.3)
Chloride: 105 mmol/L (ref 98–111)
Creatinine, Ser: 0.83 mg/dL (ref 0.61–1.24)
GFR, Estimated: >60 mL/min (ref >60)
Glucose, Bld: 106 mg/dL — ABNORMAL HIGH (ref 70–99)
Potassium: 3.6 mmol/L (ref 3.5–5.1)
Sodium: 135 mmol/L (ref 135–145)
Total Bilirubin: 0.5 mg/dL (ref 0.3–1.2)
Total Protein: 7.4 g/dL (ref 6.5–8.1)

## 2022-05-23 LAB — CBC WITH DIFFERENTIAL/PLATELET
Abs Immature Granulocytes: 0.03 10*3/uL (ref 0.00–0.07)
Basophils Absolute: 0 10*3/uL (ref 0.0–0.1)
Basophils Relative: 1 %
Eosinophils Absolute: 0.1 10*3/uL (ref 0.0–0.5)
Eosinophils Relative: 1 %
HCT: 32 % — ABNORMAL LOW (ref 39.0–52.0)
Hemoglobin: 10.5 g/dL — ABNORMAL LOW (ref 13.0–17.0)
Immature Granulocytes: 1 %
Lymphocytes Relative: 7 %
Lymphs Abs: 0.3 10*3/uL — ABNORMAL LOW (ref 0.7–4.0)
MCH: 25.4 pg — ABNORMAL LOW (ref 26.0–34.0)
MCHC: 32.8 g/dL (ref 30.0–36.0)
MCV: 77.5 fL — ABNORMAL LOW (ref 80.0–100.0)
Monocytes Absolute: 0.5 10*3/uL (ref 0.1–1.0)
Monocytes Relative: 11 %
Neutro Abs: 3.8 10*3/uL (ref 1.7–7.7)
Neutrophils Relative %: 79 %
Platelets: 291 10*3/uL (ref 150–400)
RBC: 4.13 MIL/uL — ABNORMAL LOW (ref 4.22–5.81)
RDW: 17.2 % — ABNORMAL HIGH (ref 11.5–15.5)
WBC: 4.8 10*3/uL (ref 4.0–10.5)
nRBC: 0 % (ref 0.0–0.2)

## 2022-05-23 MED ORDER — SODIUM CHLORIDE 0.9 % IV SOLN
Freq: Once | INTRAVENOUS | Status: AC
  Filled 2022-05-23: qty 250

## 2022-05-23 MED ORDER — SODIUM CHLORIDE 0.9 % IV SOLN
10.0000 mg | Freq: Once | INTRAVENOUS | Status: AC
  Administered 2022-05-23: 10 mg via INTRAVENOUS
  Filled 2022-05-23: qty 10

## 2022-05-23 MED ORDER — HEPARIN SOD (PORK) LOCK FLUSH 100 UNIT/ML IV SOLN
500.0000 [IU] | Freq: Once | INTRAVENOUS | Status: AC | PRN
  Administered 2022-05-23: 500 [IU]
  Filled 2022-05-23: qty 5

## 2022-05-23 MED ORDER — FAMOTIDINE IN NACL 20-0.9 MG/50ML-% IV SOLN
20.0000 mg | Freq: Once | INTRAVENOUS | Status: AC
  Administered 2022-05-23: 20 mg via INTRAVENOUS
  Filled 2022-05-23: qty 50

## 2022-05-23 MED ORDER — SODIUM CHLORIDE 0.9 % IV SOLN
257.4000 mg | Freq: Once | INTRAVENOUS | Status: AC
  Administered 2022-05-23: 260 mg via INTRAVENOUS
  Filled 2022-05-23: qty 26

## 2022-05-23 MED ORDER — MORPHINE SULFATE (CONCENTRATE) 10 MG /0.5 ML PO SOLN: 10.0000 mg | Freq: Four times a day (QID) | ORAL | 0 refills | Status: DC | PRN

## 2022-05-23 MED ORDER — PALONOSETRON HCL INJECTION 0.25 MG/5ML
0.2500 mg | Freq: Once | INTRAVENOUS | Status: AC
  Administered 2022-05-23: 0.25 mg via INTRAVENOUS
  Filled 2022-05-23: qty 5

## 2022-05-23 NOTE — Therapy (Signed)
[facility] at [place], [place], [place], [address] Phone: [phone]   Fax:  [phone]  Occupational Therapy Screen:  Patient Details  Name: Clarence Skinner MRN: [id number] Date of Birth: 04/24/1961 No data recorded  Encounter Date: 05/23/2022   OT End of Session - 05/23/22 1058     Visit Number 0             Past Medical History:  Diagnosis Date   COPD (chronic obstructive pulmonary disease) ([place])    GERD (gastroesophageal reflux disease)    Gout    Hypertension    Multilevel degenerative disc disease    Pneumonia 11/29/2017   Sleep apnea    CPAP   Tachycardia    Wears dentures    partial upper and lower    Past Surgical History:  Procedure Laterality Date   ABCESS DRAINAGE  2009   tonsil   COLONOSCOPY WITH PROPOFOL N/A 02/11/2018   Procedure: COLONOSCOPY WITH PROPOFOL;  Surgeon: Toledo, Benay Pike, MD;  Location: [facility];  Service: Endoscopy;  Laterality: N/A;   ESOPHAGOGASTRODUODENOSCOPY (EGD) WITH PROPOFOL N/A 02/11/2018   Procedure: ESOPHAGOGASTRODUODENOSCOPY (EGD) WITH PROPOFOL;  Surgeon: Toledo, Benay Pike, MD;  Location: [facility];  Service: Endoscopy;  Laterality: N/A;   IR IMAGING GUIDED PORT INSERTION  04/09/2022   MASS EXCISION Right 08/14/2017   Procedure: EXCISION RIGHT TEMPORAL MASS SUBMENTAL MASS;  Surgeon: Carloyn Manner, MD;  Location: [facility];  Service: ENT;  Laterality: Right;  sleep apnea   RADIOACTIVE SEED IMPLANT N/A 01/26/2019   Procedure: RADIOACTIVE SEED IMPLANT/BRACHYTHERAPY IMPLANT;  Surgeon: Billey Co, MD;  Location: [facility];  Service: Urology;  Laterality: N/A;   SEPTOPLASTY  2016   [place], [place]   VOLUME STUDY N/A 12/29/2018   Procedure: VOLUME STUDY;  Surgeon: Billey Co, MD;  Location: [facility];  Service: Urology;  Laterality: N/A;    There were no vitals filed for this visit.   Subjective Assessment - 05/23/22 1057      Subjective  I having some issues with swallowing and neck pain in the back.  My head gets really heavy and have a hard time keeping it up.              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 Recommend concurrent chemotherapy with radiation. Labs are reviewed and discussed with patient. Proceed with carboplatin AUC 2  OT SCREEN 05/23/22: Patient arrive with reports of having trouble swallowing as well as posterior neck pain.  Neck feeling heavy at times during the day hard time keeping it up.  Patient receiving radiation mostly on the right side of the neck.  Reports increased tightness and pain with motion. Upon asking patient about referral to speech therapy.  Patient reports they called him but did not know they were addressing swallowing issues.  Request if they can contact him again because of swallowing issues. Message sent to SLP to call pt again. Patient report discomfort mostly on posterior and right side of neck. Patient show decreased cervical extension more than flexion  Rotation to the right and left 40 degrees. Lateral flexion to the right 30 into the left 20. Patient was educated in correct posture for sitting and sleeping.  Patient was propping neck up with 1 or 2 pillows pushing head and cervical flexion.  Educated on correct positioning when sitting up in bed watching TV or in recliner  during the day. Patient is a side sleeper mostly. Educated patient on doing a small roll under her neck in supine doing gentle active range of motion in supine for cervical rotation, extension and lateral flexion. Reinforced pain-free gentle motion 6-8 reps 3 times a day. Patient wants to follow-up in 2 weeks - if continued to have issues we will request PT eval and treat order.                                      Visit Diagnosis: Neck pain    Problem List Patient Active  Problem List   Diagnosis Date Noted   Mass of soft tissue 05/16/2022   Blurred vision 05/16/2022   Hypotension 05/09/2022   Thrush 05/09/2022   Mucositis 04/11/2022   Sinusitis, chronic 04/04/2022   Encounter for antineoplastic chemotherapy 03/28/2022   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[place]) 03/22/2022   Goals of care, counseling/discussion 03/22/2022   Carotid sinus syndrome 03/22/2022   Neck pain 03/22/2022   Mass of right side of neck    Anemia 02/27/2022   Tonsillar mass 02/27/2022   Overweight (BMI 25.0-29.9) 02/27/2022   Syncope 02/26/2022   Pain in joint of left elbow 01/10/2022   Chronic obstructive pulmonary disease ([place]) 06/17/2020   Prostate cancer ([place]) 03/31/2019   Mass of upper lobe of left lung 12/02/2017   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   Tobacco use disorder 11/06/2017   Cervical spondylosis without myelopathy 08/27/2017   Facet arthritis of cervical region 08/27/2017   Obstructive sleep apnea 03/13/2017   Pulmonary hypertension, unspecified ([place]) 01/02/2017   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   Essential hypertension 12/27/2016   Tachycardia 12/27/2016    Rosalyn Gess, OTR/L,CLT 05/23/2022, 10:59 AM  [facility] [address], [place], [place], [address] Phone: [phone]   Fax:  [phone]  Name: Clarence Skinner MRN: [id number] Date of Birth: [birth year]

## 2022-05-23 NOTE — Assessment & Plan Note (Signed)
Sucralfate liquid 4 times daily. Magic mouth wash swish and swallow PRN mouth sore /swallowing pain Oral morphine '10mg'$ /0.101m every 6 hours as needed.  He understands that oral morphine solution prescription may not be refilled in the future after he recovers from radiation related mucositis.

## 2022-05-23 NOTE — Assessment & Plan Note (Signed)
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 Recommend concurrent chemotherapy with radiation. Labs are reviewed and discussed with patient. Proceed with carboplatin AUC 2

## 2022-05-23 NOTE — Progress Notes (Signed)
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 Recommend concurrent chemotherapy with radiation. Labs are reviewed and discussed with patient. Proceed with carboplatin AUC 2    Encounter for antineoplastic chemotherapy Treatment plan as listed above  Thrush Improved. Continue prophylactic Nystatin mouth wash swish and swallow.  Mucositis Sucralfate liquid 4 times daily. Magic mouth wash swish and swallow PRN mouth sore /swallowing pain Oral morphine '10mg'$ /0.14m every 6 hours as needed.  He understands that oral morphine solution prescription may not be refilled in the future after he recovers from radiation related mucositis.   No orders of the defined types were placed in this encounter.    Follow up 1 week lab MD carboplatin  All questions were answered. The patient knows to call the clinic with any problems, questions or concerns. No barriers to learning was detected.  ZEarlie Server MD 05/23/2022   CHIEF COMPLAINTS/PURPOSE OF CONSULTATION:  Stage IVA right tonsillar squamous cell carcinoma  HISTORY OF PRESENTING ILLNESS:  Clarence Mckelvin53y.o. male presents to establish care for  I have reviewed his chart and materials related to his cancer extensively and collaborated history with the patient. Summary of oncologic history is as follows: Oncology History  Primary tonsillar  squamous cell carcinoma ([place]  02/26/2022 Imaging   CTA neck showed 1, 4.3 x 2.5 cm ovoid soft tissue focus at the right level II/III stations (along the posterior aspect of the proximal ICA, carotid bifurcation and distal CCA). This likely reflects an enlarged lymph node.  2 Abnormal soft tissue surrounding the mid-to-distal cervical right ICA with resultant mild vessel narrowing 3 Atherosclerotic plaque within the bilateral common carotid and cervical internal carotid arteries without hemodynamically significant stenosis. 4 Vertebral arteries patent within the neck. Mild-to-moderate atherosclerotic narrowing at the origin of the right vertebral artery. No more than mild atherosclerotic stenosis at the origin of the left vertebral artery. 5 Aortic Atherosclerosis and Emphysema 6. Cervical spondylosis   02/27/2022 Imaging   MRI neck soft tissue w wo Constellation of findings most compatible with a roughly 2.4 cm Right Tonsillar Carcinoma (series 12, image 11) with associated right level 2 and level 3 malignant nodal, ex-nodal disease (series 14, image 13). Recommend ENT consultation    02/27/2022 Imaging   02/27/2022 MRI brain without contrast showed  No acute intracranial abnormality. Abnormal right neck soft tissues. See dedicated Neck MRI today reported separately. 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 These are slow flow vascular malformations which are generally asymptomatic. .Marland KitchenOther moderately advanced but nonspecific cerebral white matter signal changes.   02/28/2022 Initial Diagnosis   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[place]  02/28/2022, status post ultrasound core biopsy. Metastatic squamous cell carcinoma. Moderately differentiated with focal keratinization.  p16 negative.    02/28/2022 Cancer Staging   Staging form: Pharynx - P16 Negative Oropharynx, AJCC 8th Edition - Clinical stage from 02/28/2022: Stage  IVA (cT2, cN2b, cM0, p16-) - Signed by [name], MD on 03/22/2022 Stage prefix: Initial diagnosis   03/19/2022 Imaging   PET scan showed Signs of RIGHT neck malignancy with metastatic involvement just below the skull base and at level II/III also on the RIGHT. No signs of contralateral nodal involvement.   03/28/2022 -  Chemotherapy   Patient is on Treatment Plan : HEAD/NECK Carboplatin (AUC 2) q7d      Patient initially presented emergency room due to intermittent syncope/near syncope episodes, diaphoretic, nauseated. Imaging findings are concerning for right tonsil mass with level 2/level 3 lymph node involvement which contributes to syncope/near syncope episodes -carotid sinus syndrome. 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  COVID-19 infection.  Chemotherapy radiation was held for 2 weeks.    INTERVAL HISTORY Clarence Skinner is a 61 y.o. male who has above history reviewed by me today presents for follow up visit for management of stage IVa right tonsil squamous cell carcinoma Decrease of taste, hard to swallow, no dysphagia. No fever, chills, nausea vomiting.  + weight loss.  Patient reports swallowing difficulties due to sore throat.  He uses Magic mouthwash swish and swallow.  Also on nystatin solution for thrush prophylaxis.     MEDICAL HISTORY:  Past Medical History:  Diagnosis Date   COPD (chronic obstructive pulmonary disease) ([facility])    GERD (gastroesophageal reflux disease)    Gout    Hypertension    Multilevel degenerative disc disease    Pneumonia 11/29/2017   Sleep apnea    CPAP   Tachycardia    Wears dentures    partial upper and lower    SURGICAL HISTORY: Past Surgical History:  Procedure Laterality Date   ABCESS DRAINAGE  2009   tonsil   COLONOSCOPY WITH PROPOFOL N/A 02/11/2018   Procedure: COLONOSCOPY WITH PROPOFOL;  Surgeon: Toledo, Benay Pike, MD;  Location: [facility];  Service: Endoscopy;  Laterality: N/A;    ESOPHAGOGASTRODUODENOSCOPY (EGD) WITH PROPOFOL N/A 02/11/2018   Procedure: ESOPHAGOGASTRODUODENOSCOPY (EGD) WITH PROPOFOL;  Surgeon: Toledo, Benay Pike, MD;  Location: [facility];  Service: Endoscopy;  Laterality: N/A;   IR IMAGING GUIDED PORT INSERTION  04/09/2022   MASS EXCISION Right 08/14/2017   Procedure: EXCISION RIGHT TEMPORAL MASS SUBMENTAL MASS;  Surgeon: Carloyn Manner, MD;  Location: [place];  Service: ENT;  Laterality: Right;  sleep apnea   RADIOACTIVE SEED IMPLANT N/A 01/26/2019   Procedure: RADIOACTIVE SEED IMPLANT/BRACHYTHERAPY IMPLANT;  Surgeon: Billey Co, MD;  Location: [facility];  Service: Urology;  Laterality: N/A;   SEPTOPLASTY  2016   [place], [place]   VOLUME STUDY N/A 12/29/2018   Procedure: VOLUME STUDY;  Surgeon: Billey Co, MD;  Location: [facility];  Service: Urology;  Laterality: N/A;    SOCIAL HISTORY: Social History   Socioeconomic History   Marital status: Significant Other    Spouse name: Not on file   Number of children: Not on file   Years of education: Not on file   Highest education level: Not on file  Occupational History   Not on file  Tobacco Use   Smoking status: Every Day    Packs/day: 1.00    Years: 30.00    Total pack years: 30.00    Types: Cigarettes   Smokeless tobacco: Former    Types: Snuff, Chew    Quit date: 12/05/1984   Tobacco comments:    since age 50  Vaping Use   Vaping Use: Never used  Substance and  Sexual Activity   Alcohol use: Yes    Alcohol/week: 12.0 standard drinks of alcohol    Types: 12 Cans of beer per week    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   Drug use: Not Currently   Sexual activity: Yes  Other Topics Concern   Not on file  Social History Narrative   Not on file   Social Determinants of Health   Financial Resource Strain: High Risk (04/03/2022)   Overall Financial Resource Strain (CARDIA)    Difficulty of Paying Living Expenses: Hard  Food Insecurity: Food  Insecurity Present (04/03/2022)   Hunger Vital Sign    Worried About Running Out of Food in the Last Year: Often true    Ran Out of Food in the Last Year: Often true  Transportation Needs: Unmet Transportation Needs (04/03/2022)   PRAPARE - Hydrologist (Medical): Yes    Lack of Transportation (Non-Medical): Yes  Physical Activity: Inactive (04/03/2022)   Exercise Vital Sign    Days of Exercise per Week: 0 days    Minutes of Exercise per Session: 0 min  Stress: Stress Concern Present (04/03/2022)   [place]    Feeling of Stress : Very much  Social Connections: Moderately Isolated (04/03/2022)   Social Connection and Isolation Panel [NHANES]    Frequency of Communication with Friends and Family: Never    Frequency of Social Gatherings with Friends and Family: Three times a week    Attends Religious Services: Never    Active Member of Clubs or Organizations: No    Attends Archivist Meetings: Not on file    Marital Status: Living with partner  Intimate Partner Violence: Not At Risk (04/03/2022)   Humiliation, Afraid, Rape, and Kick questionnaire    Fear of Current or Ex-Partner: No    Emotionally Abused: No    Physically Abused: No    Sexually Abused: No    FAMILY HISTORY: Family History  Problem Relation Age of Onset   Anxiety disorder Mother    Cancer Father    Hypertension Sister    Diabetes Sister    Asthma Sister    Other Brother        mva  at age 43   Gout Brother    Hypertension Brother    Prostate cancer Maternal Uncle    Prostate cancer Maternal Uncle     ALLERGIES:  is allergic to chocolate, eggs or egg-derived products, milk (cow), benadryl [diphenhydramine], and milk-related compounds.  MEDICATIONS:  Current Outpatient Medications  Medication Sig Dispense Refill   albuterol (PROVENTIL HFA;VENTOLIN HFA) 108 (90 Base) MCG/ACT inhaler Inhale into the lungs  every 6 (six) hours as needed for wheezing or shortness of breath.     allopurinol (ZYLOPRIM) 300 MG tablet Take 300 mg by mouth daily.     amLODipine-benazepril (LOTREL) 10-40 MG capsule Take 1 capsule by mouth daily.     Azelastine HCl 137 MCG/SPRAY SOLN PLACE 1 SPRAY INTO THE NOSE 2 (TWO) TIMES DAILY AS NEEDED. 30 mL 1   baclofen (LIORESAL) 10 MG tablet Take 10 mg by mouth 2 (two) times daily as needed.     benzonatate (TESSALON) 100 MG capsule Take 1 capsule (100 mg total) by mouth every 8 (eight) hours as needed. 30 capsule 0   dexamethasone (DECADRON) 4 MG tablet Take 0.5 tablets (2 mg total) by mouth 2 (two) times daily with a meal. 30  tablet 0   docusate sodium (COLACE) 100 MG capsule Take 100 mg by mouth daily as needed.     fluticasone (FLONASE) 50 MCG/ACT nasal spray Place into both nostrils daily as needed for allergies or rhinitis.     Fluticasone-Umeclidin-Vilant (TRELEGY ELLIPTA) 100-62.5-25 MCG/ACT AEPB Trelegy Ellipta 100-62.5-25 MCG/INH Inhalation Aerosol Powder Breath Activated QTY: 90 each Days: 90 Refills: 1  Written: 11/02/20 Patient Instructions: One puff once a day     furosemide (LASIX) 20 MG tablet Take 20 mg by mouth daily.     gabapentin (NEURONTIN) 100 MG capsule Take 1 capsule (100 mg total) by mouth at bedtime. 30 capsule 1   ibuprofen (ADVIL) 800 MG tablet SMARTSIG:1 Tablet(s) By Mouth Every 12 Hours PRN     lidocaine-prilocaine (EMLA) cream Apply 1 Application topically as directed. Apply small amount of cream to port a cath site 1-2 hours prior to port being accessed. 30 g 2   loratadine (CLARITIN) 10 MG tablet Take 10 mg by mouth daily.     magic mouthwash (multi-ingredient) oral suspension Swish and spit with 5 to 10 mL by mouth four times a day as needed for mouth pain 480 mL 1   magic mouthwash w/lidocaine SOLN Take 5 mLs by mouth 4 (four) times daily as needed for mouth pain. Sig: Swish/Spit 5-10 ml four times a day as needed. Dispense 480 ml. 1RF 480 mL 1    Morphine Sulfate (MORPHINE CONCENTRATE) 10 mg / 0.5 ml concentrated solution Take 0.5 mLs (10 mg total) by mouth every 6 (six) hours as needed for severe pain or moderate pain. 30 mL 0   nystatin (MYCOSTATIN) 100000 UNIT/ML suspension Take 5 mLs (500,000 Units total) by mouth 4 (four) times daily. Swish and swallow 473 mL 0   Oxycodone HCl 10 MG TABS Take 10 mg by mouth every 6 (six) hours as needed.     pantoprazole (PROTONIX) 40 MG tablet Take 40 mg by mouth daily.     prednisoLONE (ORAPRED) 15 MG/5ML solution Take by mouth.     prochlorperazine (COMPAZINE) 10 MG tablet Take 1 tablet (10 mg total) by mouth every 6 (six) hours as needed for nausea or vomiting. 30 tablet 1   rosuvastatin (CRESTOR) 20 MG tablet Take 1 tablet (20 mg total) by mouth daily. 30 tablet 5   senna (SENOKOT) 8.6 MG TABS tablet Take 1 tablet (8.6 mg total) by mouth daily. 120 tablet 0   sucralfate (CARAFATE) 1 GM/10ML suspension Take 10 mLs (1 g total) by mouth 4 (four) times daily -  with meals and at bedtime. 420 mL 0   SYMBICORT 80-4.5 MCG/ACT inhaler Inhale 1 puff into the lungs 2 (two) times daily.     Vitamin D, Ergocalciferol, (DRISDOL) 1.25 MG (50000 UNIT) CAPS capsule Take 50,000 Units by mouth once a week.     meloxicam (MOBIC) 7.5 MG tablet Take 7.5 mg by mouth daily as needed for pain. (Patient not taking: Reported on 05/23/2022)     potassium chloride SA (KLOR-CON M) 20 MEQ tablet Take 1 tablet (20 mEq total) by mouth daily. (Patient not taking: Reported on 05/09/2022) 3 tablet 0   tadalafil (CIALIS) 20 MG tablet Take 1 tablet (20 mg total) by mouth daily. (Patient not taking: Reported on 05/02/2022) 30 tablet 11   No current facility-administered medications for this visit.   Facility-Administered Medications Ordered in Other Visits  Medication Dose Route Frequency Provider Last Rate Last Admin   sodium chloride flush (NS) 0.9 % injection  10 mL  10 mL Intracatheter PRN [name], MD   10 mL at 04/11/22 1221     Review of Systems  Constitutional:  Positive for fatigue and unexpected weight change. Negative for chills and fever.  HENT:   Positive for hearing loss. Negative for voice change.   Eyes:  Negative for eye problems and icterus.       Blurred vision  Respiratory:  Negative for chest tightness, cough and shortness of breath.   Cardiovascular:  Negative for chest pain and leg swelling.  Gastrointestinal:  Negative for abdominal distention and abdominal pain.  Endocrine: Negative for hot flashes.  Genitourinary:  Negative for difficulty urinating, dysuria and frequency.   Musculoskeletal:  Positive for arthralgias and neck pain.  Skin:  Negative for itching and rash.  Neurological:  Negative for light-headedness and numbness.  Hematological:  Negative for adenopathy. Does not bruise/bleed easily.  Psychiatric/Behavioral:  Negative for confusion.      PHYSICAL EXAMINATION: ECOG PERFORMANCE STATUS: 1 - Symptomatic but completely ambulatory  Vitals:   05/23/22 0939  BP: 108/72  Pulse: 94  Temp: (!) 96 F (35.6 C)  SpO2: 100%   Filed Weights   05/23/22 0939  Weight: 160 lb 14.4 oz (73 kg)    Physical Exam Constitutional:      General: He is not in acute distress.    Appearance: He is not diaphoretic.  HENT:     Head: Normocephalic and atraumatic.     Nose: Nose normal.     Mouth/Throat:     Pharynx: No oropharyngeal exudate.  Eyes:     General: No scleral icterus.    Pupils: Pupils are equal, round, and reactive to light.  Cardiovascular:     Rate and Rhythm: Normal rate and regular rhythm.     Heart sounds: No murmur heard. Pulmonary:     Effort: Pulmonary effort is normal. No respiratory distress.     Breath sounds: No rales.  Chest:     Chest wall: No tenderness.  Abdominal:     General: There is no distension.     Palpations: Abdomen is soft.     Tenderness: There is no abdominal tenderness.  Musculoskeletal:        General: Normal range of motion.      Cervical back: Normal range of motion and neck supple.  Skin:    General: Skin is warm and dry.     Findings: No erythema.  Neurological:     Mental Status: He is alert and oriented to person, place, and time.     Cranial Nerves: No cranial nerve deficit.     Motor: No abnormal muscle tone.     Coordination: Coordination normal.  Psychiatric:        Mood and Affect: Mood and affect normal.      LABORATORY DATA:  I have reviewed the data as listed    Latest Ref Rng & Units 05/23/2022    8:59 AM 05/16/2022    8:52 AM 05/09/2022    9:05 AM  CBC  WBC 4.0 - 10.5 K/uL 4.8  5.7  5.0   Hemoglobin 13.0 - 17.0 g/dL 10.5  10.9  10.7   Hematocrit 39.0 - 52.0 % 32.0  33.6  33.0   Platelets 150 - 400 K/uL 291  355  252       Latest Ref Rng & Units 05/23/2022    8:59 AM 05/16/2022    8:52 AM 05/09/2022    9:05 AM  CMP  Glucose 70 - 99 mg/dL 106  102  104   BUN 6 - 20 mg/dL '8  8  10   '$ Creatinine 0.61 - 1.24 mg/dL 0.83  0.78  0.74   Sodium 135 - 145 mmol/L 135  134  133   Potassium 3.5 - 5.1 mmol/L 3.6  3.8  3.7   Chloride 98 - 111 mmol/L 105  103  102   CO2 22 - 32 mmol/L '24  24  24   '$ Calcium 8.9 - 10.3 mg/dL 8.9  9.0  8.9   Total Protein 6.5 - 8.1 g/dL 7.4  7.9  7.4   Total Bilirubin 0.3 - 1.2 mg/dL 0.5  0.6  0.5   Alkaline Phos 38 - 126 U/L 67  69  66   AST 15 - 41 U/L '13  12  14   '$ ALT 0 - 44 U/L '12  10  13       '$ RADIOGRAPHIC STUDIES: I have personally reviewed the radiological images as listed and agreed with the findings in the report. No results found.

## 2022-05-23 NOTE — Patient Instructions (Signed)
[facility]  Discharge Instructions: Thank you for choosing [facility] to provide your oncology and hematology care.  If you have a lab appointment with the Cancer Center, please go directly to the Cancer Center and check in at the registration area.  Wear comfortable clothing and clothing appropriate for easy access to any Portacath or PICC line.   We strive to give you quality time with your provider. You may need to reschedule your appointment if you arrive late (15 or more minutes).  Arriving late affects you and other patients whose appointments are after yours.  Also, if you miss three or more appointments without notifying the office, you may be dismissed from the clinic at the provider's discretion.      For prescription refill requests, have your pharmacy contact our office and allow 72 hours for refills to be completed.    Today you received the following chemotherapy and/or immunotherapy agents: Carboplatin      To help prevent nausea and vomiting after your treatment, we encourage you to take your nausea medication as directed.  BELOW ARE SYMPTOMS THAT SHOULD BE REPORTED IMMEDIATELY: *FEVER GREATER THAN 100.4 F (38 C) OR HIGHER *CHILLS OR SWEATING *NAUSEA AND VOMITING THAT IS NOT CONTROLLED WITH YOUR NAUSEA MEDICATION *UNUSUAL SHORTNESS OF BREATH *UNUSUAL BRUISING OR BLEEDING *URINARY PROBLEMS (pain or burning when urinating, or frequent urination) *BOWEL PROBLEMS (unusual diarrhea, constipation, pain near the anus) TENDERNESS IN MOUTH AND THROAT WITH OR WITHOUT PRESENCE OF ULCERS (sore throat, sores in mouth, or a toothache) UNUSUAL RASH, SWELLING OR PAIN  UNUSUAL VAGINAL DISCHARGE OR ITCHING   Items with * indicate a potential emergency and should be followed up as soon as possible or go to the Emergency Department if any problems should occur.  Please show the CHEMOTHERAPY ALERT CARD or IMMUNOTHERAPY ALERT CARD at check-in  to the Emergency Department and triage nurse.  Should you have questions after your visit or need to cancel or reschedule your appointment, please contact [facility]  [phone] and follow the prompts.  Office hours are 8:00 a.m. to 4:30 p.m. Monday - Friday. Please note that voicemails left after 4:00 p.m. may not be returned until the following business day.  We are closed weekends and major holidays. You have access to a nurse at all times for urgent questions. Please call the main number to the clinic [phone] and follow the prompts.  For any non-urgent questions, you may also contact your provider using MyChart. We now offer e-Visits for anyone 18 and older to request care online for non-urgent symptoms. For details visit [email or web address].   Also download the MyChart app! Go to the app store, search "MyChart", open the app, select [facility], and log in with your MyChart username and password.  Masks are optional in the cancer centers. If you would like for your care team to wear a mask while they are taking care of you, please let them know. For doctor visits, patients may have with them one support person who is at least 59 years old. At this time, visitors are not allowed in the infusion area.   

## 2022-05-23 NOTE — Assessment & Plan Note (Signed)
Treatment plan as listed above. 

## 2022-05-23 NOTE — Assessment & Plan Note (Signed)
Improved. Continue prophylactic Nystatin mouth wash swish and swallow.

## 2022-05-24 ENCOUNTER — Other Ambulatory Visit: Payer: Self-pay | Admitting: Radiation Oncology

## 2022-05-24 ENCOUNTER — Ambulatory Visit
Admission: RE | Admit: 2022-05-24 | Discharge: 2022-05-24 | Disposition: A | Discharge status: Home / Self Care | Payer: Medicaid Other | Source: Ambulatory Visit | Attending: Radiation Oncology | Admitting: Radiation Oncology

## 2022-05-24 ENCOUNTER — Other Ambulatory Visit: Payer: Self-pay

## 2022-05-24 DIAGNOSIS — F17210 Nicotine dependence, cigarettes, uncomplicated: Secondary | ICD-10-CM | POA: Diagnosis not present

## 2022-05-24 DIAGNOSIS — Z510 Encounter for antineoplastic radiation therapy: Secondary | ICD-10-CM | POA: Diagnosis not present

## 2022-05-24 DIAGNOSIS — C099 Malignant neoplasm of tonsil, unspecified: Secondary | ICD-10-CM | POA: Diagnosis not present

## 2022-05-24 DIAGNOSIS — Z515 Encounter for palliative care: Secondary | ICD-10-CM | POA: Diagnosis not present

## 2022-05-24 DIAGNOSIS — G9001 Carotid sinus syncope: Secondary | ICD-10-CM | POA: Diagnosis not present

## 2022-05-24 DIAGNOSIS — Z7689 Persons encountering health services in other specified circumstances: Secondary | ICD-10-CM | POA: Diagnosis not present

## 2022-05-24 DIAGNOSIS — C770 Secondary and unspecified malignant neoplasm of lymph nodes of head, face and neck: Secondary | ICD-10-CM | POA: Diagnosis not present

## 2022-05-24 DIAGNOSIS — Z20822 Contact with and (suspected) exposure to covid-19: Secondary | ICD-10-CM | POA: Diagnosis not present

## 2022-05-24 DIAGNOSIS — K1230 Oral mucositis (ulcerative), unspecified: Secondary | ICD-10-CM | POA: Diagnosis not present

## 2022-05-24 DIAGNOSIS — G893 Neoplasm related pain (acute) (chronic): Secondary | ICD-10-CM | POA: Diagnosis not present

## 2022-05-24 DIAGNOSIS — Z5111 Encounter for antineoplastic chemotherapy: Secondary | ICD-10-CM | POA: Diagnosis not present

## 2022-05-24 LAB — RAD ONC ARIA SESSION SUMMARY
Course Elapsed Days: 58
Plan Fractions Treated to Date: 31
Plan Prescribed Dose Per Fraction: 2 Gy
Plan Total Fractions Prescribed: 35
Plan Total Prescribed Dose: 70 Gy
Reference Point Dosage Given to Date: 62 Gy
Reference Point Session Dosage Given: 2 Gy
Session Number: 31

## 2022-05-24 NOTE — Addendum Note (Signed)
Addended by: Irean Hong on: 05/24/2022 09:15 AM   Modules accepted: Orders

## 2022-05-25 ENCOUNTER — Ambulatory Visit
Admission: RE | Admit: 2022-05-25 | Discharge: 2022-05-25 | Disposition: A | Discharge status: Home / Self Care | Payer: Medicaid Other | Source: Ambulatory Visit | Attending: Radiation Oncology | Admitting: Radiation Oncology

## 2022-05-25 ENCOUNTER — Other Ambulatory Visit: Payer: Self-pay

## 2022-05-25 DIAGNOSIS — C099 Malignant neoplasm of tonsil, unspecified: Secondary | ICD-10-CM | POA: Diagnosis not present

## 2022-05-25 DIAGNOSIS — Z515 Encounter for palliative care: Secondary | ICD-10-CM | POA: Diagnosis not present

## 2022-05-25 DIAGNOSIS — Z7689 Persons encountering health services in other specified circumstances: Secondary | ICD-10-CM | POA: Diagnosis not present

## 2022-05-25 DIAGNOSIS — Z510 Encounter for antineoplastic radiation therapy: Secondary | ICD-10-CM | POA: Diagnosis not present

## 2022-05-25 DIAGNOSIS — Z5111 Encounter for antineoplastic chemotherapy: Secondary | ICD-10-CM | POA: Diagnosis not present

## 2022-05-25 DIAGNOSIS — G893 Neoplasm related pain (acute) (chronic): Secondary | ICD-10-CM | POA: Diagnosis not present

## 2022-05-25 DIAGNOSIS — F17210 Nicotine dependence, cigarettes, uncomplicated: Secondary | ICD-10-CM | POA: Diagnosis not present

## 2022-05-25 DIAGNOSIS — C770 Secondary and unspecified malignant neoplasm of lymph nodes of head, face and neck: Secondary | ICD-10-CM | POA: Diagnosis not present

## 2022-05-25 DIAGNOSIS — G9001 Carotid sinus syncope: Secondary | ICD-10-CM | POA: Diagnosis not present

## 2022-05-25 DIAGNOSIS — Z20822 Contact with and (suspected) exposure to covid-19: Secondary | ICD-10-CM | POA: Diagnosis not present

## 2022-05-25 DIAGNOSIS — K1230 Oral mucositis (ulcerative), unspecified: Secondary | ICD-10-CM | POA: Diagnosis not present

## 2022-05-25 LAB — RAD ONC ARIA SESSION SUMMARY
Course Elapsed Days: 59
Plan Fractions Treated to Date: 32
Plan Prescribed Dose Per Fraction: 2 Gy
Plan Total Fractions Prescribed: 35
Plan Total Prescribed Dose: 70 Gy
Reference Point Dosage Given to Date: 64 Gy
Reference Point Session Dosage Given: 2 Gy
Session Number: 32

## 2022-05-28 ENCOUNTER — Ambulatory Visit
Admission: RE | Admit: 2022-05-28 | Discharge: 2022-05-28 | Disposition: A | Discharge status: Home / Self Care | Payer: Medicaid Other | Source: Ambulatory Visit | Attending: Radiation Oncology | Admitting: Radiation Oncology

## 2022-05-28 ENCOUNTER — Other Ambulatory Visit: Payer: Self-pay

## 2022-05-28 DIAGNOSIS — G9001 Carotid sinus syncope: Secondary | ICD-10-CM | POA: Diagnosis not present

## 2022-05-28 DIAGNOSIS — Z20822 Contact with and (suspected) exposure to covid-19: Secondary | ICD-10-CM | POA: Diagnosis not present

## 2022-05-28 DIAGNOSIS — K1230 Oral mucositis (ulcerative), unspecified: Secondary | ICD-10-CM | POA: Diagnosis not present

## 2022-05-28 DIAGNOSIS — Z515 Encounter for palliative care: Secondary | ICD-10-CM | POA: Diagnosis not present

## 2022-05-28 DIAGNOSIS — F17210 Nicotine dependence, cigarettes, uncomplicated: Secondary | ICD-10-CM | POA: Diagnosis not present

## 2022-05-28 DIAGNOSIS — Z7689 Persons encountering health services in other specified circumstances: Secondary | ICD-10-CM | POA: Diagnosis not present

## 2022-05-28 DIAGNOSIS — G893 Neoplasm related pain (acute) (chronic): Secondary | ICD-10-CM | POA: Diagnosis not present

## 2022-05-28 DIAGNOSIS — C770 Secondary and unspecified malignant neoplasm of lymph nodes of head, face and neck: Secondary | ICD-10-CM | POA: Diagnosis not present

## 2022-05-28 DIAGNOSIS — Z5111 Encounter for antineoplastic chemotherapy: Secondary | ICD-10-CM | POA: Diagnosis not present

## 2022-05-28 DIAGNOSIS — C099 Malignant neoplasm of tonsil, unspecified: Secondary | ICD-10-CM | POA: Diagnosis not present

## 2022-05-28 DIAGNOSIS — Z510 Encounter for antineoplastic radiation therapy: Secondary | ICD-10-CM | POA: Diagnosis not present

## 2022-05-28 LAB — RAD ONC ARIA SESSION SUMMARY
Course Elapsed Days: 62
Plan Fractions Treated to Date: 33
Plan Prescribed Dose Per Fraction: 2 Gy
Plan Total Fractions Prescribed: 35
Plan Total Prescribed Dose: 70 Gy
Reference Point Dosage Given to Date: 66 Gy
Reference Point Session Dosage Given: 2 Gy
Session Number: 33

## 2022-05-29 ENCOUNTER — Other Ambulatory Visit: Payer: Self-pay

## 2022-05-29 ENCOUNTER — Ambulatory Visit
Admission: RE | Admit: 2022-05-29 | Discharge: 2022-05-29 | Disposition: A | Discharge status: Home / Self Care | Payer: Medicaid Other | Source: Ambulatory Visit | Attending: Radiation Oncology | Admitting: Radiation Oncology

## 2022-05-29 ENCOUNTER — Other Ambulatory Visit: Payer: Self-pay | Admitting: *Deleted

## 2022-05-29 DIAGNOSIS — Z20822 Contact with and (suspected) exposure to covid-19: Secondary | ICD-10-CM | POA: Diagnosis not present

## 2022-05-29 DIAGNOSIS — Z510 Encounter for antineoplastic radiation therapy: Secondary | ICD-10-CM | POA: Diagnosis not present

## 2022-05-29 DIAGNOSIS — G9001 Carotid sinus syncope: Secondary | ICD-10-CM | POA: Diagnosis not present

## 2022-05-29 DIAGNOSIS — K1230 Oral mucositis (ulcerative), unspecified: Secondary | ICD-10-CM | POA: Diagnosis not present

## 2022-05-29 DIAGNOSIS — C770 Secondary and unspecified malignant neoplasm of lymph nodes of head, face and neck: Secondary | ICD-10-CM | POA: Diagnosis not present

## 2022-05-29 DIAGNOSIS — G893 Neoplasm related pain (acute) (chronic): Secondary | ICD-10-CM | POA: Diagnosis not present

## 2022-05-29 DIAGNOSIS — Z515 Encounter for palliative care: Secondary | ICD-10-CM | POA: Diagnosis not present

## 2022-05-29 DIAGNOSIS — Z7689 Persons encountering health services in other specified circumstances: Secondary | ICD-10-CM | POA: Diagnosis not present

## 2022-05-29 DIAGNOSIS — C099 Malignant neoplasm of tonsil, unspecified: Secondary | ICD-10-CM | POA: Diagnosis not present

## 2022-05-29 DIAGNOSIS — Z5111 Encounter for antineoplastic chemotherapy: Secondary | ICD-10-CM | POA: Diagnosis not present

## 2022-05-29 DIAGNOSIS — F17210 Nicotine dependence, cigarettes, uncomplicated: Secondary | ICD-10-CM | POA: Diagnosis not present

## 2022-05-29 LAB — RAD ONC ARIA SESSION SUMMARY
Course Elapsed Days: 63
Plan Fractions Treated to Date: 34
Plan Prescribed Dose Per Fraction: 2 Gy
Plan Total Fractions Prescribed: 35
Plan Total Prescribed Dose: 70 Gy
Reference Point Dosage Given to Date: 68 Gy
Reference Point Session Dosage Given: 2 Gy
Session Number: 34

## 2022-05-30 ENCOUNTER — Ambulatory Visit: Payer: Medicaid Other | Admitting: Oncology

## 2022-05-30 ENCOUNTER — Ambulatory Visit
Admission: RE | Admit: 2022-05-30 | Discharge: 2022-05-30 | Disposition: A | Discharge status: Home / Self Care | Payer: Medicaid Other | Source: Ambulatory Visit | Attending: Radiation Oncology | Admitting: Radiation Oncology

## 2022-05-30 ENCOUNTER — Other Ambulatory Visit: Payer: Medicaid Other

## 2022-05-30 ENCOUNTER — Encounter: Payer: Self-pay | Admitting: Oncology

## 2022-05-30 ENCOUNTER — Inpatient Hospital Stay: Payer: Medicaid Other

## 2022-05-30 ENCOUNTER — Other Ambulatory Visit: Payer: Self-pay

## 2022-05-30 ENCOUNTER — Ambulatory Visit: Payer: Medicaid Other

## 2022-05-30 ENCOUNTER — Inpatient Hospital Stay (HOSPITAL_BASED_OUTPATIENT_CLINIC_OR_DEPARTMENT_OTHER): Payer: Medicaid Other | Admitting: Oncology

## 2022-05-30 ENCOUNTER — Ambulatory Visit: Payer: Medicaid Other | Admitting: Dietician

## 2022-05-30 VITALS — BP 128/82 | HR 84 | Temp 97.6°F | Wt 157.9 lb

## 2022-05-30 DIAGNOSIS — Z5111 Encounter for antineoplastic chemotherapy: Secondary | ICD-10-CM

## 2022-05-30 DIAGNOSIS — F17210 Nicotine dependence, cigarettes, uncomplicated: Secondary | ICD-10-CM | POA: Diagnosis not present

## 2022-05-30 DIAGNOSIS — J0100 Acute maxillary sinusitis, unspecified: Secondary | ICD-10-CM

## 2022-05-30 DIAGNOSIS — Z515 Encounter for palliative care: Secondary | ICD-10-CM | POA: Diagnosis not present

## 2022-05-30 DIAGNOSIS — J019 Acute sinusitis, unspecified: Secondary | ICD-10-CM | POA: Insufficient documentation

## 2022-05-30 DIAGNOSIS — C099 Malignant neoplasm of tonsil, unspecified: Secondary | ICD-10-CM

## 2022-05-30 DIAGNOSIS — B370 Candidal stomatitis: Secondary | ICD-10-CM | POA: Diagnosis not present

## 2022-05-30 DIAGNOSIS — K1230 Oral mucositis (ulcerative), unspecified: Secondary | ICD-10-CM

## 2022-05-30 DIAGNOSIS — Z20822 Contact with and (suspected) exposure to covid-19: Secondary | ICD-10-CM | POA: Diagnosis not present

## 2022-05-30 DIAGNOSIS — Z510 Encounter for antineoplastic radiation therapy: Secondary | ICD-10-CM | POA: Diagnosis not present

## 2022-05-30 DIAGNOSIS — J328 Other chronic sinusitis: Secondary | ICD-10-CM | POA: Diagnosis not present

## 2022-05-30 DIAGNOSIS — G9001 Carotid sinus syncope: Secondary | ICD-10-CM | POA: Diagnosis not present

## 2022-05-30 DIAGNOSIS — C770 Secondary and unspecified malignant neoplasm of lymph nodes of head, face and neck: Secondary | ICD-10-CM | POA: Diagnosis not present

## 2022-05-30 DIAGNOSIS — G893 Neoplasm related pain (acute) (chronic): Secondary | ICD-10-CM | POA: Diagnosis not present

## 2022-05-30 LAB — CBC WITH DIFFERENTIAL/PLATELET
Abs Immature Granulocytes: 0.04 10*3/uL (ref 0.00–0.07)
Basophils Absolute: 0 10*3/uL (ref 0.0–0.1)
Basophils Relative: 0 %
Eosinophils Absolute: 0 10*3/uL (ref 0.0–0.5)
Eosinophils Relative: 0 %
HCT: 31.2 % — ABNORMAL LOW (ref 39.0–52.0)
Hemoglobin: 10.5 g/dL — ABNORMAL LOW (ref 13.0–17.0)
Immature Granulocytes: 1 %
Lymphocytes Relative: 5 %
Lymphs Abs: 0.4 10*3/uL — ABNORMAL LOW (ref 0.7–4.0)
MCH: 25.8 pg — ABNORMAL LOW (ref 26.0–34.0)
MCHC: 33.7 g/dL (ref 30.0–36.0)
MCV: 76.7 fL — ABNORMAL LOW (ref 80.0–100.0)
Monocytes Absolute: 0.5 10*3/uL (ref 0.1–1.0)
Monocytes Relative: 7 %
Neutro Abs: 6.7 10*3/uL (ref 1.7–7.7)
Neutrophils Relative %: 87 %
Platelets: 259 10*3/uL (ref 150–400)
RBC: 4.07 MIL/uL — ABNORMAL LOW (ref 4.22–5.81)
RDW: 17.4 % — ABNORMAL HIGH (ref 11.5–15.5)
WBC: 7.7 10*3/uL (ref 4.0–10.5)
nRBC: 0 % (ref 0.0–0.2)

## 2022-05-30 LAB — COMPREHENSIVE METABOLIC PANEL
ALT: 19 U/L (ref 0–44)
AST: 12 U/L — ABNORMAL LOW (ref 15–41)
Albumin: 3.9 g/dL (ref 3.5–5.0)
Alkaline Phosphatase: 61 U/L (ref 38–126)
Anion gap: 7 (ref 5–15)
BUN: 19 mg/dL (ref 6–20)
CO2: 24 mmol/L (ref 22–32)
Calcium: 8.9 mg/dL (ref 8.9–10.3)
Chloride: 101 mmol/L (ref 98–111)
Creatinine, Ser: 0.86 mg/dL (ref 0.61–1.24)
GFR, Estimated: >60 mL/min (ref >60)
Glucose, Bld: 88 mg/dL (ref 70–99)
Potassium: 3.9 mmol/L (ref 3.5–5.1)
Sodium: 132 mmol/L — ABNORMAL LOW (ref 135–145)
Total Bilirubin: 0.4 mg/dL (ref 0.3–1.2)
Total Protein: 7.4 g/dL (ref 6.5–8.1)

## 2022-05-30 LAB — RAD ONC ARIA SESSION SUMMARY
Course Elapsed Days: 64
Plan Fractions Treated to Date: 35
Plan Prescribed Dose Per Fraction: 2 Gy
Plan Total Fractions Prescribed: 35
Plan Total Prescribed Dose: 70 Gy
Reference Point Dosage Given to Date: 70 Gy
Reference Point Session Dosage Given: 2 Gy
Session Number: 35

## 2022-05-30 MED ORDER — SODIUM CHLORIDE 0.9 % IV SOLN
INTRAVENOUS | Status: DC
  Filled 2022-05-30: qty 250

## 2022-05-30 MED ORDER — FAMOTIDINE IN NACL 20-0.9 MG/50ML-% IV SOLN
20.0000 mg | Freq: Once | INTRAVENOUS | Status: AC
  Administered 2022-05-30: 20 mg via INTRAVENOUS
  Filled 2022-05-30: qty 50

## 2022-05-30 MED ORDER — SODIUM CHLORIDE 0.9 % IV SOLN
10.0000 mg | Freq: Once | INTRAVENOUS | Status: AC
  Administered 2022-05-30: 10 mg via INTRAVENOUS
  Filled 2022-05-30: qty 1

## 2022-05-30 MED ORDER — HEPARIN SOD (PORK) LOCK FLUSH 100 UNIT/ML IV SOLN
500.0000 [IU] | Freq: Once | INTRAVENOUS | Status: AC | PRN
  Administered 2022-05-30: 500 [IU]
  Filled 2022-05-30: qty 5

## 2022-05-30 MED ORDER — AMOXICILLIN-POT CLAVULANATE 875-125 MG PO TABS: 1.0000 | ORAL_TABLET | Freq: Two times a day (BID) | ORAL | 0 refills | Status: DC

## 2022-05-30 MED ORDER — PALONOSETRON HCL INJECTION 0.25 MG/5ML
0.2500 mg | Freq: Once | INTRAVENOUS | Status: AC
  Administered 2022-05-30: 0.25 mg via INTRAVENOUS
  Filled 2022-05-30: qty 5

## 2022-05-30 MED ORDER — SODIUM CHLORIDE 0.9 % IV SOLN
260.0000 mg | Freq: Once | INTRAVENOUS | Status: AC
  Administered 2022-05-30: 260 mg via INTRAVENOUS
  Filled 2022-05-30: qty 26

## 2022-05-30 MED ORDER — SODIUM CHLORIDE 0.9 % IV SOLN
Freq: Once | INTRAVENOUS | Status: AC
  Filled 2022-05-30: qty 250

## 2022-05-30 NOTE — Assessment & Plan Note (Addendum)
Sucralfate liquid 4 times daily. Magic mouth wash swish and swallow PRN mouth sore /swallowing pain Oral morphine '10mg'$ /0.79m every 6 hours as needed.  IV fluid hydration sessions weekly.

## 2022-05-30 NOTE — Progress Notes (Signed)
Attempted to reach patient for scheduled follow up during his final treatment.  Called mobile # left message on voice mail, and sent text requesting call back.  Nutrition Follow-up:   Patient called back during his infusion.  He reports foods still sticking but he's trying to use extra gravies and is managing all textures with small bites.  He reports didn't eat much this weekend  as he was struggling with constipation. Some foods he can taste but most just aren't tasting good. BK double burger (charred flavor he can taste),  couldn't get [employer] chicken down.    Only one ONS per day still has case left.     Medications: reviewed he report oral iron d/c'd   Labs: reviewed, 05/30/22 Na+ 132    Anthropometrics: patient's is down per final radiation weight during treatment    Weight: 05/29/22  157 (radiation UT) 05/16/22   161# 05/09/22  164.8# 05/02/22  162# 04/04/22 170 lb 03/27/22  168 lb 11.2 oz  UBW of 176 lb  DBW:  180#  NUTRITION DIAGNOSIS: Inadequate oral intake continues      INTERVENTION:  Encouraged returning to eating breakfast now that radiation completed.    Encouraged increase in fluids (doesn't tolerate Gatorade and most juice, willing to attempt diluted V*-Splash) Encouraged high calorie drinks to lubricate foods as he eats Encourage 1-2 ONS daily Encouraged use of nutrient dense foods to ad in weight gain and  Goal of 2-5#/month   MONITORING, EVALUATION, GOAL: weight trends, intake     NEXT VISIT: remote follow up 3 weeks  [name], RDN, LDN Registered Dietitian, [facility] Part Time Remote (Usual office hours: Tuesday-Thursday) Mobile: [phone]

## 2022-05-30 NOTE — Assessment & Plan Note (Addendum)
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 Recommend concurrent chemotherapy with radiation. Labs are reviewed and discussed with patient. Proceed with carboplatin AUC 2 He finishes radiation today. Repeat PET scan in 10 weeks for assessment of treatment response.

## 2022-05-30 NOTE — Assessment & Plan Note (Signed)
Treatment plan as listed above. 

## 2022-05-30 NOTE — Assessment & Plan Note (Signed)
Resolved. Continue prophylactic Nystatin mouth wash until he finishes his supply

## 2022-05-30 NOTE — Assessment & Plan Note (Signed)
Recommend Augmentin '875mg'$  BID x 5 days.

## 2022-05-30 NOTE — Assessment & Plan Note (Deleted)
Recommend Augmentin 875 mg twice daily x5 days.

## 2022-05-30 NOTE — Progress Notes (Signed)
Hematology/Oncology Progress note [phone] [phone]        REFERRING PROVIDER: [name], MD   Patient Care Team: Mechele Claude, FNP as PCP - General (Family Medicine) Kate Sable, MD as PCP - Cardiology (Cardiology) [name], MD as Consulting Physician (Oncology)  ASSESSMENT & PLAN:   Cancer Staging  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[place]) Staging form: Pharynx - P16 Negative Oropharynx, AJCC 8th Edition - Clinical stage from 02/28/2022: Stage IVA (cT2, cN2b, cM0, p16-) - Signed by [name], MD on 03/22/2022   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[place]) 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 Recommend concurrent chemotherapy with radiation. Labs are reviewed and discussed with patient. Proceed with carboplatin AUC 2 He finishes radiation today. Repeat PET scan in 10 weeks for assessment of treatment response.  Encounter for antineoplastic chemotherapy Treatment plan as listed above  Thrush Resolved. Continue prophylactic Nystatin mouth wash until he finishes his supply  Sinusitis, acute Recommend Augmentin '875mg'$  BID x 5 days.   Mucositis Sucralfate liquid 4 times daily. Magic mouth wash swish and swallow PRN mouth sore /swallowing pain Oral morphine '10mg'$ /0.50m every 6 hours as needed.  IV fluid hydration sessions weekly.    Orders Placed This Encounter  Procedures   NM PET Image Restag (PS) Skull Base To Thigh    Standing Status:   Future    Standing Expiration Date:   05/30/2023    Order Specific Question:   If indicated for the ordered procedure, I authorize the administration of a radiopharmaceutical per Radiology protocol    Answer:   Yes    Order Specific Question:   Preferred imaging location?    Answer:   [facility]   CBC with Differential/Platelet    Standing Status:   Future    Standing Expiration Date:   05/30/2023   Comprehensive metabolic panel     Standing Status:   Future    Standing Expiration Date:   05/30/2023   CBC with Differential/Platelet    Standing Status:   Future    Standing Expiration Date:   05/30/2023   Comprehensive metabolic panel    Standing Status:   Future    Standing Expiration Date:   05/30/2023      Follow up  Weekly IV fluid hydration.  PET scan in 10 weeks.  Follow-up in 4 weeks.  All questions were answered. The patient knows to call the clinic with any problems, questions or concerns. No barriers to learning was detected.  ZEarlie Server MD 05/30/2022   CHIEF COMPLAINTS/PURPOSE OF CONSULTATION:  Stage IVA right tonsillar squamous cell carcinoma  HISTORY OF PRESENTING ILLNESS:  Clarence Ohanesian89y.o. male presents to establish care for  I have reviewed his chart and materials related to his cancer extensively and collaborated history with the patient. Summary of oncologic history is as follows: Oncology History  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[place]  02/26/2022 Imaging   CTA neck showed 1, 4.3 x 2.5 cm ovoid soft tissue focus at the right level II/III stations (along the posterior aspect of the proximal ICA, carotid bifurcation and distal CCA). This likely reflects an enlarged lymph node.  2 Abnormal soft tissue surrounding the mid-to-distal cervical right ICA with resultant mild vessel narrowing 3 Atherosclerotic plaque within the bilateral common carotid and cervical internal carotid arteries without hemodynamically significant stenosis. 4 Vertebral arteries patent within the neck. Mild-to-moderate atherosclerotic narrowing at the origin of the right vertebral artery. No more  than mild atherosclerotic stenosis at the origin of the left vertebral artery. 5 Aortic Atherosclerosis and Emphysema 6. Cervical spondylosis   02/27/2022 Imaging   MRI neck soft tissue w wo Constellation of findings most compatible with a roughly 2.4 cm Right Tonsillar Carcinoma (series 12, image 11) with associated right  level 2 and level 3 malignant nodal, ex-nodal disease (series 14, image 13). Recommend ENT consultation    02/27/2022 Imaging   02/27/2022 MRI brain without contrast showed  No acute intracranial abnormality. Abnormal right neck soft tissues. See dedicated Neck MRI today reported separately. 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 These are slow flow vascular malformations which are generally asymptomatic. [name] Other moderately advanced but nonspecific cerebral white matter signal changes.   02/28/2022 Initial Diagnosis   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[place])  02/28/2022, status post ultrasound core biopsy. Metastatic squamous cell carcinoma. Moderately differentiated with focal keratinization.  p16 negative.    02/28/2022 Cancer Staging   Staging form: Pharynx - P16 Negative Oropharynx, AJCC 8th Edition - Clinical stage from 02/28/2022: Stage IVA (cT2, cN2b, cM0, p16-) - Signed by [name], MD on 03/22/2022 Stage prefix: Initial diagnosis   03/19/2022 Imaging   PET scan showed Signs of RIGHT neck malignancy with metastatic involvement just below the skull base and at level II/III also on the RIGHT. No signs of contralateral nodal involvement.   03/28/2022 -  Chemotherapy   Patient is on Treatment Plan : HEAD/NECK Carboplatin (AUC 2) q7d      Patient initially presented emergency room due to intermittent syncope/near syncope episodes, diaphoretic, nauseated. Imaging findings are concerning for right tonsil mass with level 2/level 3 lymph node involvement which contributes to syncope/near syncope episodes -carotid sinus syndrome. 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  COVID-19 infection.  Chemotherapy radiation was held for 2 weeks.    INTERVAL HISTORY Clarence Skinner is a 61 y.o. male who has above history reviewed by me today presents for follow up visit for management of stage IVa right tonsil  squamous cell carcinoma No fever, chills, nausea vomiting.  + weight loss.  Clarence Skinner has resolved.  Continues to have decreased taste, swallowing difficulties. + Sinus congestion, yellowish discharge.  No fever or chills.  No cough sore throat.   MEDICAL HISTORY:  Past Medical History:  Diagnosis Date   COPD (chronic obstructive pulmonary disease) (HCC)    GERD (gastroesophageal reflux disease)    Gout    Hypertension    Multilevel degenerative disc disease    Pneumonia 11/29/2017   Sleep apnea    CPAP   Tachycardia    Wears dentures    partial upper and lower    SURGICAL HISTORY: Past Surgical History:  Procedure Laterality Date   ABCESS DRAINAGE  2009   tonsil   COLONOSCOPY WITH PROPOFOL N/A 02/11/2018   Procedure: COLONOSCOPY WITH PROPOFOL;  Surgeon: Toledo, Benay Pike, MD;  Location: [facility];  Service: Endoscopy;  Laterality: N/A;   ESOPHAGOGASTRODUODENOSCOPY (EGD) WITH PROPOFOL N/A 02/11/2018   Procedure: ESOPHAGOGASTRODUODENOSCOPY (EGD) WITH PROPOFOL;  Surgeon: Toledo, Benay Pike, MD;  Location: [facility];  Service: Endoscopy;  Laterality: N/A;   IR IMAGING GUIDED PORT INSERTION  04/09/2022   MASS EXCISION Right 08/14/2017   Procedure: EXCISION RIGHT TEMPORAL MASS SUBMENTAL MASS;  Surgeon: Carloyn Manner, MD;  Location: [facility];  Service: ENT;  Laterality: Right;  sleep apnea   RADIOACTIVE SEED IMPLANT N/A 01/26/2019  Procedure: RADIOACTIVE SEED IMPLANT/BRACHYTHERAPY IMPLANT;  Surgeon: Billey Co, MD;  Location: [facility];  Service: Urology;  Laterality: N/A;   SE[place]OPLASTY  2016   [place]   VOLUME STUDY N/A 12/29/2018   Procedure: VOLUME STUDY;  Surgeon: Billey Co, MD;  Location: [facility];  Service: Urology;  Laterality: N/A;    SOCIAL HISTORY: Social History   Socioeconomic History   Marital status: Significant Other    Spouse name: Not on file   Number of children: Not on file   Years of education: Not on file   Highest  education level: Not on file  Occupational History   Not on file  Tobacco Use   Smoking status: Every Day    Packs/day: 1.00    Years: 30.00    Total pack years: 30.00    Types: Cigarettes   Smokeless tobacco: Former    Types: Snuff, Chew    Quit date: 12/05/1984   Tobacco comments:    since age 83  Vaping Use   Vaping Use: Never used  Substance and Sexual Activity   Alcohol use: Yes    Alcohol/week: 12.0 standard drinks of alcohol    Types: 12 Cans of beer per week    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   Drug use: Not Currently   Sexual activity: Yes  Other Topics Concern   Not on file  Social History Narrative   Not on file   Social Determinants of Health   Financial Resource Strain: High Risk (04/03/2022)   Overall Financial Resource Strain (CARDIA)    Difficulty of Paying Living Expenses: Hard  Food Insecurity: Food Insecurity Present (04/03/2022)   Hunger Vital Sign    Worried About Running Out of Food in the Last Year: Often true    Ran Out of Food in the Last Year: Often true  Transportation Needs: Unmet Transportation Needs (04/03/2022)   PRAPARE - Hydrologist (Medical): Yes    Lack of Transportation (Non-Medical): Yes  Physical Activity: Inactive (04/03/2022)   Exercise Vital Sign    Days of Exercise per Week: 0 days    Minutes of Exercise per Session: 0 min  Stress: Stress Concern Present (04/03/2022)   [place]    Feeling of Stress : Very much  Social Connections: Moderately Isolated (04/03/2022)   Social Connection and Isolation Panel [NHANES]    Frequency of Communication with Friends and Family: Never    Frequency of Social Gatherings with Friends and Family: Three times a week    Attends Religious Services: Never    Active Member of Clubs or Organizations: No    Attends Archivist Meetings: Not on file    Marital Status: Living  with partner  Intimate Partner Violence: Not At Risk (04/03/2022)   Humiliation, Afraid, Rape, and Kick questionnaire    Fear of Current or Ex-Partner: No    Emotionally Abused: No    Physically Abused: No    Sexually Abused: No    FAMILY HISTORY: Family History  Problem Relation Age of Onset   Anxiety disorder Mother    Cancer Father    Hypertension Sister    Diabetes Sister    Asthma Sister    Other Brother        mva  at age 58   Gout Brother    Hypertension Brother    Prostate cancer Maternal Uncle  Prostate cancer Maternal Uncle     ALLERGIES:  is allergic to chocolate, eggs or egg-derived products, milk (cow), benadryl [diphenhydramine], and milk-related compounds.  MEDICATIONS:  Current Outpatient Medications  Medication Sig Dispense Refill   albuterol (PROVENTIL HFA;VENTOLIN HFA) 108 (90 Base) MCG/ACT inhaler Inhale into the lungs every 6 (six) hours as needed for wheezing or shortness of breath.     allopurinol (ZYLOPRIM) 300 MG tablet Take 300 mg by mouth daily.     amLODipine-benazepril (LOTREL) 10-40 MG capsule Take 1 capsule by mouth daily.     amoxicillin-clavulanate (AUGMENTIN) 875-125 MG tablet Take 1 tablet by mouth 2 (two) times daily. 10 tablet 0   Azelastine HCl 137 MCG/SPRAY SOLN PLACE 1 SPRAY INTO THE NOSE 2 (TWO) TIMES DAILY AS NEEDED. 30 mL 1   baclofen (LIORESAL) 10 MG tablet Take 10 mg by mouth 2 (two) times daily as needed.     benzonatate (TESSALON) 100 MG capsule Take 1 capsule (100 mg total) by mouth every 8 (eight) hours as needed. 30 capsule 0   dexamethasone (DECADRON) 4 MG tablet Take 0.5 tablets (2 mg total) by mouth 2 (two) times daily with a meal. 30 tablet 0   docusate sodium (COLACE) 100 MG capsule Take 100 mg by mouth daily as needed.     fluticasone (FLONASE) 50 MCG/ACT nasal spray Place into both nostrils daily as needed for allergies or rhinitis.     Fluticasone-Umeclidin-Vilant (TRELEGY ELLIPTA) 100-62.5-25 MCG/ACT AEPB Trelegy  Ellipta 100-62.5-25 MCG/INH Inhalation Aerosol Powder Breath Activated QTY: 90 each Days: 90 Refills: 1  Written: 11/02/20 Patient Instructions: One puff once a day     furosemide (LASIX) 20 MG tablet Take 20 mg by mouth daily.     gabapentin (NEURONTIN) 100 MG capsule Take 1 capsule (100 mg total) by mouth at bedtime. 30 capsule 1   lidocaine-prilocaine (EMLA) cream Apply 1 Application topically as directed. Apply small amount of cream to port a cath site 1-2 hours prior to port being accessed. 30 g 2   loratadine (CLARITIN) 10 MG tablet Take 10 mg by mouth daily.     magic mouthwash (multi-ingredient) oral suspension Swish and spit with 5 to 10 mL by mouth four times a day as needed for mouth pain 480 mL 1   magic mouthwash w/lidocaine SOLN Take 5 mLs by mouth 4 (four) times daily as needed for mouth pain. Sig: Swish/Spit 5-10 ml four times a day as needed. Dispense 480 ml. 1RF 480 mL 1   Morphine Sulfate (MORPHINE CONCENTRATE) 10 mg / 0.5 ml concentrated solution Take 0.5 mLs (10 mg total) by mouth every 6 (six) hours as needed for severe pain or moderate pain. 30 mL 0   nystatin (MYCOSTATIN) 100000 UNIT/ML suspension Take 5 mLs (500,000 Units total) by mouth 4 (four) times daily. Swish and swallow 473 mL 0   Oxycodone HCl 10 MG TABS Take 10 mg by mouth every 6 (six) hours as needed.     pantoprazole (PROTONIX) 40 MG tablet Take 40 mg by mouth daily.     prednisoLONE (ORAPRED) 15 MG/5ML solution Take by mouth.     prochlorperazine (COMPAZINE) 10 MG tablet Take 1 tablet (10 mg total) by mouth every 6 (six) hours as needed for nausea or vomiting. 30 tablet 1   rosuvastatin (CRESTOR) 20 MG tablet Take 1 tablet (20 mg total) by mouth daily. 30 tablet 5   senna (SENOKOT) 8.6 MG TABS tablet Take 1 tablet (8.6 mg total) by mouth daily.  120 tablet 0   sucralfate (CARAFATE) 1 GM/10ML suspension Take 10 mLs (1 g total) by mouth 4 (four) times daily -  with meals and at bedtime. 420 mL 0   SYMBICORT 80-4.5  MCG/ACT inhaler Inhale 1 puff into the lungs 2 (two) times daily.     Vitamin D, Ergocalciferol, (DRISDOL) 1.25 MG (50000 UNIT) CAPS capsule Take 50,000 Units by mouth once a week.     ferrous sulfate 325 (65 FE) MG tablet Take 325 mg by mouth daily. (Patient not taking: Reported on 05/30/2022)     ibuprofen (ADVIL) 800 MG tablet SMARTSIG:1 Tablet(s) By Mouth Every 12 Hours PRN (Patient not taking: Reported on 05/30/2022)     meloxicam (MOBIC) 7.5 MG tablet Take 7.5 mg by mouth daily as needed for pain. (Patient not taking: Reported on 05/23/2022)     potassium chloride SA (KLOR-CON M) 20 MEQ tablet Take 1 tablet (20 mEq total) by mouth daily. (Patient not taking: Reported on 05/09/2022) 3 tablet 0   tadalafil (CIALIS) 20 MG tablet Take 1 tablet (20 mg total) by mouth daily. (Patient not taking: Reported on 05/30/2022) 30 tablet 11   No current facility-administered medications for this visit.   Facility-Administered Medications Ordered in Other Visits  Medication Dose Route Frequency Provider Last Rate Last Admin   0.9 %  sodium chloride infusion   Intravenous Continuous [name], MD   Stopped at 05/30/22 1257   sodium chloride flush (NS) 0.9 % injection 10 mL  10 mL Intracatheter PRN [name], MD   10 mL at 04/11/22 1221    Review of Systems  Constitutional:  Positive for fatigue and unexpected weight change. Negative for chills and fever.  HENT:   Positive for hearing loss. Negative for voice change.        Dry mouth  Eyes:  Negative for eye problems and icterus.  Respiratory:  Negative for chest tightness, cough and shortness of breath.   Cardiovascular:  Negative for chest pain and leg swelling.  Gastrointestinal:  Negative for abdominal distention and abdominal pain.  Endocrine: Negative for hot flashes.  Genitourinary:  Negative for difficulty urinating, dysuria and frequency.   Musculoskeletal:  Positive for arthralgias.  Skin:  Negative for itching and rash.  Neurological:  Negative  for light-headedness and numbness.  Hematological:  Negative for adenopathy. Does not bruise/bleed easily.  Psychiatric/Behavioral:  Negative for confusion.      PHYSICAL EXAMINATION: ECOG PERFORMANCE STATUS: 1 - Symptomatic but completely ambulatory  Vitals:   05/30/22 1002  BP: 128/82  Pulse: 84  Temp: 97.6 F (36.4 C)  SpO2: 100%   Filed Weights   05/30/22 1002  Weight: 157 lb 14.4 oz (71.6 kg)    Physical Exam Constitutional:      General: He is not in acute distress.    Appearance: He is not diaphoretic.  HENT:     Head: Normocephalic and atraumatic.     Nose: Nose normal.     Mouth/Throat:     Pharynx: No oropharyngeal exudate.  Eyes:     General: No scleral icterus.    Pupils: Pupils are equal, round, and reactive to light.  Cardiovascular:     Rate and Rhythm: Normal rate and regular rhythm.     Heart sounds: No murmur heard. Pulmonary:     Effort: Pulmonary effort is normal. No respiratory distress.     Breath sounds: No rales.  Chest:     Chest wall: No tenderness.  Abdominal:  General: There is no distension.     Palpations: Abdomen is soft.     Tenderness: There is no abdominal tenderness.  Musculoskeletal:        General: Normal range of motion.     Cervical back: Normal range of motion and neck supple.  Skin:    General: Skin is warm and dry.     Findings: No erythema.  Neurological:     Mental Status: He is alert and oriented to person, place, and time.     Cranial Nerves: No cranial nerve deficit.     Motor: No abnormal muscle tone.     Coordination: Coordination normal.  Psychiatric:        Mood and Affect: Mood and affect normal.      LABORATORY DATA:  I have reviewed the data as listed    Latest Ref Rng & Units 05/30/2022    9:52 AM 05/23/2022    8:59 AM 05/16/2022    8:52 AM  CBC  WBC 4.0 - 10.5 K/uL 7.7  4.8  5.7   Hemoglobin 13.0 - 17.0 g/dL 10.5  10.5  10.9   Hematocrit 39.0 - 52.0 % 31.2  32.0  33.6   Platelets 150 -  400 K/uL 259  291  355       Latest Ref Rng & Units 05/30/2022    9:52 AM 05/23/2022    8:59 AM 05/16/2022    8:52 AM  CMP  Glucose 70 - 99 mg/dL 88  106  102   BUN 6 - 20 mg/dL '19  8  8   '$ Creatinine 0.61 - 1.24 mg/dL 0.86  0.83  0.78   Sodium 135 - 145 mmol/L 132  135  134   Potassium 3.5 - 5.1 mmol/L 3.9  3.6  3.8   Chloride 98 - 111 mmol/L 101  105  103   CO2 22 - 32 mmol/L '24  24  24   '$ Calcium 8.9 - 10.3 mg/dL 8.9  8.9  9.0   Total Protein 6.5 - 8.1 g/dL 7.4  7.4  7.9   Total Bilirubin 0.3 - 1.2 mg/dL 0.4  0.5  0.6   Alkaline Phos 38 - 126 U/L 61  67  69   AST 15 - 41 U/L '12  13  12   '$ ALT 0 - 44 U/L '19  12  10       '$ RADIOGRAPHIC STUDIES: I have personally reviewed the radiological images as listed and agreed with the findings in the report. No results found.

## 2022-05-31 ENCOUNTER — Ambulatory Visit
Admission: RE | Admit: 2022-05-31 | Discharge: 2022-05-31 | Disposition: A | Discharge status: Home / Self Care | Payer: Medicaid Other | Source: Ambulatory Visit | Attending: Oncology | Admitting: Oncology

## 2022-05-31 ENCOUNTER — Ambulatory Visit: Payer: Medicaid Other | Attending: Hospice and Palliative Medicine | Admitting: Speech Pathology

## 2022-05-31 ENCOUNTER — Encounter: Payer: Self-pay | Admitting: Internal Medicine

## 2022-05-31 ENCOUNTER — Other Ambulatory Visit: Payer: Self-pay | Admitting: Hospice and Palliative Medicine

## 2022-05-31 DIAGNOSIS — M7989 Other specified soft tissue disorders: Secondary | ICD-10-CM | POA: Diagnosis not present

## 2022-05-31 DIAGNOSIS — C099 Malignant neoplasm of tonsil, unspecified: Secondary | ICD-10-CM | POA: Diagnosis not present

## 2022-05-31 DIAGNOSIS — G8929 Other chronic pain: Secondary | ICD-10-CM | POA: Insufficient documentation

## 2022-05-31 DIAGNOSIS — Z859 Personal history of malignant neoplasm, unspecified: Secondary | ICD-10-CM | POA: Diagnosis not present

## 2022-05-31 DIAGNOSIS — R1312 Dysphagia, oropharyngeal phase: Secondary | ICD-10-CM | POA: Diagnosis not present

## 2022-05-31 DIAGNOSIS — M542 Cervicalgia: Secondary | ICD-10-CM | POA: Diagnosis not present

## 2022-05-31 DIAGNOSIS — R1313 Dysphagia, pharyngeal phase: Secondary | ICD-10-CM | POA: Insufficient documentation

## 2022-06-04 NOTE — Therapy (Signed)
OUTPATIENT SPEECH LANGUAGE PATHOLOGY SWALLOW EVALUATION   Patient Name: Clarence Skinner MRN: [id number] DOB:[birth year], 61 y.o., male Today's Date: 06/04/2022  PCP: Georgian Co, MD REFERRING PROVIDER: Altha Harm, MD   End of Session - 06/04/22 0804     Visit Number 1    Number of Visits 17    Date for SLP Re-Evaluation 08/27/22    Authorization Type Medicaid [employer]    Progress Note Due on Visit 10             Past Medical History:  Diagnosis Date   COPD (chronic obstructive pulmonary disease) ([place])    GERD (gastroesophageal reflux disease)    Gout    Hypertension    Multilevel degenerative disc disease    Pneumonia 11/29/2017   Sleep apnea    CPAP   Tachycardia    Wears dentures    partial upper and lower   Past Surgical History:  Procedure Laterality Date   ABCESS DRAINAGE  2009   tonsil   COLONOSCOPY WITH PROPOFOL N/A 02/11/2018   Procedure: COLONOSCOPY WITH PROPOFOL;  Surgeon: Toledo, Benay Pike, MD;  Location: [facility];  Service: Endoscopy;  Laterality: N/A;   ESOPHAGOGASTRODUODENOSCOPY (EGD) WITH PROPOFOL N/A 02/11/2018   Procedure: ESOPHAGOGASTRODUODENOSCOPY (EGD) WITH PROPOFOL;  Surgeon: Toledo, Benay Pike, MD;  Location: [facility];  Service: Endoscopy;  Laterality: N/A;   IR IMAGING GUIDED PORT INSERTION  04/09/2022   MASS EXCISION Right 08/14/2017   Procedure: EXCISION RIGHT TEMPORAL MASS SUBMENTAL MASS;  Surgeon: Carloyn Manner, MD;  Location: [place];  Service: ENT;  Laterality: Right;  sleep apnea   RADIOACTIVE SEED IMPLANT N/A 01/26/2019   Procedure: RADIOACTIVE SEED IMPLANT/BRACHYTHERAPY IMPLANT;  Surgeon: Billey Co, MD;  Location: [facility];  Service: Urology;  Laterality: N/A;   SEPTOPLASTY  2016   [place], [place]   VOLUME STUDY N/A 12/29/2018   Procedure: VOLUME STUDY;  Surgeon: Billey Co, MD;  Location: [facility];  Service: Urology;  Laterality: N/A;   Patient Active Problem List   Diagnosis Date Noted    Sinusitis, acute 05/30/2022   Mass of soft tissue 05/16/2022   Blurred vision 05/16/2022   Hypotension 05/09/2022   Thrush 05/09/2022   Mucositis 04/11/2022   Sinusitis, chronic 04/04/2022   Encounter for antineoplastic chemotherapy 03/28/2022   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[place]) 03/22/2022   Goals of care, counseling/discussion 03/22/2022   Carotid sinus syndrome 03/22/2022   Neck pain 03/22/2022   Mass of right side of neck    Anemia 02/27/2022   Tonsillar mass 02/27/2022   Overweight (BMI 25.0-29.9) 02/27/2022   Syncope 02/26/2022   Pain in joint of left elbow 01/10/2022   Chronic obstructive pulmonary disease ([place]) 06/17/2020   Prostate cancer ([employer]) 03/31/2019   Mass of upper lobe of left lung 12/02/2017   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   Tobacco use disorder 11/06/2017   Cervical spondylosis without myelopathy 08/27/2017   Facet arthritis of cervical region 08/27/2017   Obstructive sleep apnea 03/13/2017   Pulmonary hypertension, unspecified ([place]) 01/02/2017   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   Essential hypertension 12/27/2016   Tachycardia 12/27/2016    ONSET DATE: 02/28/2022 date of diagnosis; 05/30/2022 date of this referral   REFERRING DIAG: C09.9 (ICD-10-CM) - 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 Jimmye Norman 8/9-8/10/23: Pt was found to have a tonsillar mass and had US guided lymph node biopsy by IR on 02/28/22. 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 Pt is current smoker of 1ppd since pt was in his 55s as per pt. Onco and radiation  oncology evaluated the pt while inpatient.    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   Completed 70 Gray over 7 weeks with concurrent chemo  PAIN:  Are you having pain? No  FALLS: Has patient fallen in last 6 months?  No  LIVING ENVIRONMENT: Lives with: lives alone Lives in: House/apartment  PLOF:  Level of assistance: Independent with ADLs, Independent with IADLs Employment: On disability   PATIENT GOALS to be able to swallow better  OBJECTIVE:   COGNITION: Overall cognitive status: Within functional limits for tasks assessed Functional deficits: good historian  ORAL MOTOR EXAMINATION Facial : WFL Lingual: WFL Velum: WFL Mandible: WFL Cough: WFL Voice: WFL   CLINICAL SWALLOW ASSESSMENT:   Current diet: regular and thin liquids Dentition: adequate natural dentition Feeding: able to feed self Consistencies tested: Thin Liquid: Presentation: Cup, Spoon, By hand, and Self-fed Oral Phase: WFL Pharyngeal Phase: Impaired: suspect delayed swallow, decreased hyolaryngeal movement to palpation, multiple swallows, wet vocal quality, immediate throat clearing, immediate cough, and complaints of globus Nectar-thick Liquids: Presentation: Cup, Spoon, By hand, and Self-fed Oral Phase: WFL Pharyngeal Phase: Impaired: suspect delayed swallow, decreased hyolaryngeal movement to palpation, multiple swallows, and delayed throat clearing Puree: Presentation: Spoon and Self-fed Oral Phase: Impaired: reduced lingual movement/coordination Pharyngeal Phase: Impaired: suspect delayed swallow, decreased hyolaryngeal movement to palpation, multiple swallows, and delayed throat clearing -  reports sensation of upward movement into nasal cavity - requiring nasal rinse Regular: WNL    Aspiration risk factors:History of dysphagia, Deconditioning, and Other: Tons Overall aspiration risk:Moderate Diet Recommendations:  regular and thin liquids Precautions:Minimize environmental distractions, Slow rate, Small sips/bites, and Seated upright 90 degrees Supervision: Patient able to feed self Oral care recommendations:Oral care BID Follow-up recommendations: MBSS     PATIENT REPORTED OUTCOME MEASURES (PROM): To be completed at next session    PATIENT EDUCATION: Education details: results of this study; recommend instrumental swallow study Person educated: Patient Education method: Explanation and Demonstration Education comprehension: verbalized understanding   ASSESSMENT:  CLINICAL IMPRESSION: Patient is a 62 y.o. male who was seen today for a clinical swallow evaluation d/t .   OBJECTIVE IMPAIRMENTS include dysphagia. These impairments are limiting patient from safety when swallowing. Factors affecting potential to achieve goals and functional outcome are medical prognosis. Patient will benefit from skilled SLP services to address above impairments and improve overall function.  REHAB POTENTIAL: Good   GOALS: Goals reviewed with patient? Yes  SHORT TERM GOALS: Target date: 10 sessions  Patient will participate in objective swallowing evaluation (MBSS) to identify safest diet recommendation as well as therapeutic targets. Baseline: Goal status: INITIAL  LONG TERM GOALS: Target date: 08/28/2022 Patient will consume recommended diet using strategies and compensations without overt s/sx aspiration >95% of the time. Baseline: to be determined after completion of instrumental study Goal status: INITIAL  PLAN: SLP FREQUENCY: 1-2x/week  SLP DURATION: 12 weeks  PLANNED INTERVENTIONS: Aspiration precaution training, Pharyngeal strengthening exercises, Diet toleration management , SLP instruction and feedback, and Patient/family education     Ivery Michalski B.  Rutherford Nail, M.S., CCC-SLP, Mining engineer Certified Brain Injury [place]  [place] Office [phone] Ascom [phone] Fax [phone]

## 2022-06-06 ENCOUNTER — Inpatient Hospital Stay: Payer: Medicaid Other | Admitting: Occupational Therapy

## 2022-06-06 ENCOUNTER — Other Ambulatory Visit: Payer: Self-pay

## 2022-06-06 ENCOUNTER — Inpatient Hospital Stay: Payer: Medicaid Other

## 2022-06-06 VITALS — BP 140/66 | HR 77 | Resp 20

## 2022-06-06 DIAGNOSIS — C099 Malignant neoplasm of tonsil, unspecified: Secondary | ICD-10-CM | POA: Diagnosis not present

## 2022-06-06 DIAGNOSIS — C770 Secondary and unspecified malignant neoplasm of lymph nodes of head, face and neck: Secondary | ICD-10-CM | POA: Diagnosis not present

## 2022-06-06 DIAGNOSIS — F17210 Nicotine dependence, cigarettes, uncomplicated: Secondary | ICD-10-CM | POA: Insufficient documentation

## 2022-06-06 DIAGNOSIS — Z7689 Persons encountering health services in other specified circumstances: Secondary | ICD-10-CM | POA: Diagnosis not present

## 2022-06-06 DIAGNOSIS — Z5111 Encounter for antineoplastic chemotherapy: Secondary | ICD-10-CM | POA: Insufficient documentation

## 2022-06-06 DIAGNOSIS — K1230 Oral mucositis (ulcerative), unspecified: Secondary | ICD-10-CM | POA: Diagnosis not present

## 2022-06-06 DIAGNOSIS — I10 Essential (primary) hypertension: Secondary | ICD-10-CM | POA: Diagnosis not present

## 2022-06-06 DIAGNOSIS — M542 Cervicalgia: Secondary | ICD-10-CM

## 2022-06-06 MED ORDER — SODIUM CHLORIDE 0.9% FLUSH
10.0000 mL | Freq: Once | INTRAVENOUS | Status: AC
  Administered 2022-06-06: 10 mL via INTRAVENOUS
  Filled 2022-06-06: qty 10

## 2022-06-06 MED ORDER — SODIUM CHLORIDE 0.9 % IV SOLN
Freq: Once | INTRAVENOUS | Status: AC
  Filled 2022-06-06: qty 250

## 2022-06-06 MED ORDER — HEPARIN SOD (PORK) LOCK FLUSH 100 UNIT/ML IV SOLN
500.0000 [IU] | Freq: Once | INTRAVENOUS | Status: AC
  Administered 2022-06-06: 500 [IU] via INTRAVENOUS
  Filled 2022-06-06: qty 5

## 2022-06-06 NOTE — Therapy (Signed)
[facility] [place], [place], [place], [address] Phone: [phone]   Fax:  [phone]  Occupational Therapy Screen:  Patient Details  Name: Clarence Skinner MRN: [id number] Date of Birth: 01-May-1961 No data recorded  Encounter Date: 06/06/2022   OT End of Session - 06/06/22 1239     Visit Number 0             Past Medical History:  Diagnosis Date   COPD (chronic obstructive pulmonary disease) ([place])    GERD (gastroesophageal reflux disease)    Gout    Hypertension    Multilevel degenerative disc disease    Pneumonia 11/29/2017   Sleep apnea    CPAP   Tachycardia    Wears dentures    partial upper and lower    Past Surgical History:  Procedure Laterality Date   ABCESS DRAINAGE  2009   tonsil   COLONOSCOPY WITH PROPOFOL N/A 02/11/2018   Procedure: COLONOSCOPY WITH PROPOFOL;  Surgeon: Toledo, Benay Pike, MD;  Location: [facility];  Service: Endoscopy;  Laterality: N/A;   ESOPHAGOGASTRODUODENOSCOPY (EGD) WITH PROPOFOL N/A 02/11/2018   Procedure: ESOPHAGOGASTRODUODENOSCOPY (EGD) WITH PROPOFOL;  Surgeon: Toledo, Benay Pike, MD;  Location: [facility];  Service: Endoscopy;  Laterality: N/A;   IR IMAGING GUIDED PORT INSERTION  04/09/2022   MASS EXCISION Right 08/14/2017   Procedure: EXCISION RIGHT TEMPORAL MASS SUBMENTAL MASS;  Surgeon: Carloyn Manner, MD;  Location: [place];  Service: ENT;  Laterality: Right;  sleep apnea   RADIOACTIVE SEED IMPLANT N/A 01/26/2019   Procedure: RADIOACTIVE SEED IMPLANT/BRACHYTHERAPY IMPLANT;  Surgeon: Billey Co, MD;  Location: [facility];  Service: Urology;  Laterality: N/A;   SEPTOPLASTY  2016   [place], [place]   VOLUME STUDY N/A 12/29/2018   Procedure: VOLUME STUDY;  Surgeon: Billey Co, MD;  Location: [facility];  Service: Urology;  Laterality: N/A;    There were no vitals filed for this visit.   Subjective Assessment - 06/06/22 1238      Subjective  I am doing better pain attention how I sit with my neck.  And I did see the speech therapist thank you.  The pain is better but still feel it in the back and it gets tired.             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 Recommend concurrent chemotherapy with radiation. Labs are reviewed and discussed with patient. Proceed with carboplatin AUC 2   OT SCREEN 05/23/22: Patient arrive with reports of having trouble swallowing as well as posterior neck pain.  Neck feeling heavy at times during the day hard time keeping it up.  Patient receiving radiation mostly on the right side of the neck.  Reports increased tightness and pain with motion. Upon asking patient about referral to speech therapy.  Patient reports they called him but did not know they were addressing swallowing issues.  Request if they can contact him again because of swallowing issues. Message sent to SLP to call pt again. Patient report discomfort mostly on posterior and right side of neck. Patient show decreased cervical extension more than flexion  Rotation to the right and left 40 degrees. Lateral flexion to the right 30 into the left 20. Patient was educated in correct posture for sitting and sleeping.  Patient was propping neck up with 1 or 2 pillows pushing head and cervical flexion.  Educated on  correct positioning when sitting up in bed watching TV or in recliner during the day. Patient is a side sleeper mostly. Educated patient on doing a small roll under her neck in supine doing gentle active range of motion in supine for cervical rotation, extension and lateral flexion. Reinforced pain-free gentle motion 6-8 reps 3 times a day. Patient wants to follow-up in 2 weeks - if continued to have issues we will request PT eval and treat order.     OT SCREEN 06/06/22: Patient follow-up today after being seen 2 weeks ago.  Referred to  speech therapy.  Patient was evaluated and is followed by speech therapy.  Patient reports pain in his neck is better but still feel it on the posterior neck as well as  well as head gets tired at times. Pt is doing much better with how he is sitting and supine with his posture /positioning of neck. Patient do show increase cervical rotation and lateral flexion compared to 2 weeks ago. Rotation to the left 50, rotation to the right 55 degrees. Lateral flexion to the left 30 into the right 38. Did recommend for patient to maybe follow-up with physical therapy for evaluation and treat for cervical range of motion and decreasing pain.  Patient in agreement. Patient did finish radiation but is scheduled for (per patient )a x-ray tomorrow.                                    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[place]) 03/22/2022   Goals of care, counseling/discussion 03/22/2022   Carotid sinus syndrome 03/22/2022   Neck pain 03/22/2022   Mass of right side of neck    Anemia 02/27/2022   Tonsillar mass 02/27/2022   Overweight (BMI 25.0-29.9) 02/27/2022   Syncope 02/26/2022   Pain in joint of left elbow 01/10/2022   Chronic obstructive pulmonary disease ([place]) 06/17/2020   Prostate cancer ([place]) 03/31/2019   Mass of upper lobe of left lung 12/02/2017   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   Tobacco use disorder 11/06/2017   Cervical spondylosis without myelopathy 08/27/2017   Facet arthritis of cervical region 08/27/2017   Obstructive sleep apnea 03/13/2017   Pulmonary hypertension, unspecified ([place]) 01/02/2017    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   Essential hypertension 12/27/2016   Tachycardia 12/27/2016    Rosalyn Gess, OTR/L,CLT 06/06/2022, 12:39 PM  [place] [facility] [facility] [address], [place], [place], [address] Phone: [phone]   Fax:  [phone]  Name: Alexy Heldt MRN: [id number] Date of Birth: Apr 16, 1961

## 2022-06-07 ENCOUNTER — Ambulatory Visit
Admission: RE | Admit: 2022-06-07 | Discharge: 2022-06-07 | Disposition: A | Discharge status: Home / Self Care | Payer: Medicaid Other | Source: Ambulatory Visit | Attending: Hospice and Palliative Medicine | Admitting: Hospice and Palliative Medicine

## 2022-06-07 DIAGNOSIS — C099 Malignant neoplasm of tonsil, unspecified: Secondary | ICD-10-CM | POA: Diagnosis not present

## 2022-06-07 DIAGNOSIS — Z7689 Persons encountering health services in other specified circumstances: Secondary | ICD-10-CM | POA: Diagnosis not present

## 2022-06-07 NOTE — Therapy (Incomplete)
OUTPATIENT PHYSICAL THERAPY CERVICAL EVALUATION   Patient Name: Clarence Skinner MRN: [id number] DOB:06-26-1961, 61 y.o., male Today's Date: 06/07/2022  END OF SESSION:   Past Medical History:  Diagnosis Date   COPD (chronic obstructive pulmonary disease) ([place])    GERD (gastroesophageal reflux disease)    Gout    Hypertension    Multilevel degenerative disc disease    Pneumonia 11/29/2017   Sleep apnea    CPAP   Tachycardia    Wears dentures    partial upper and lower   Past Surgical History:  Procedure Laterality Date   ABCESS DRAINAGE  2009   tonsil   COLONOSCOPY WITH PROPOFOL N/A 02/11/2018   Procedure: COLONOSCOPY WITH PROPOFOL;  Surgeon: Toledo, Benay Pike, MD;  Location: [facility];  Service: Endoscopy;  Laterality: N/A;   ESOPHAGOGASTRODUODENOSCOPY (EGD) WITH PROPOFOL N/A 02/11/2018   Procedure: ESOPHAGOGASTRODUODENOSCOPY (EGD) WITH PROPOFOL;  Surgeon: Toledo, Benay Pike, MD;  Location: [facility];  Service: Endoscopy;  Laterality: N/A;   IR IMAGING GUIDED PORT INSERTION  04/09/2022   MASS EXCISION Right 08/14/2017   Procedure: EXCISION RIGHT TEMPORAL MASS SUBMENTAL MASS;  Surgeon: Carloyn Manner, MD;  Location: [place];  Service: ENT;  Laterality: Right;  sleep apnea   RADIOACTIVE SEED IMPLANT N/A 01/26/2019   Procedure: RADIOACTIVE SEED IMPLANT/BRACHYTHERAPY IMPLANT;  Surgeon: Billey Co, MD;  Location: [facility];  Service: Urology;  Laterality: N/A;   SEPTOPLASTY  2016   [place], [place]   VOLUME STUDY N/A 12/29/2018   Procedure: VOLUME STUDY;  Surgeon: Billey Co, MD;  Location: [facility];  Service: Urology;  Laterality: N/A;   Patient Active Problem List   Diagnosis Date Noted   Sinusitis, acute 05/30/2022   Mass of soft tissue 05/16/2022   Blurred vision 05/16/2022   Hypotension 05/09/2022   Thrush 05/09/2022   Mucositis 04/11/2022   Sinusitis, chronic 04/04/2022   Encounter for antineoplastic chemotherapy 03/28/2022   Primary  tonsillar squamous cell carcinoma ([place]) 03/22/2022   Goals of care, counseling/discussion 03/22/2022   Carotid sinus syndrome 03/22/2022   Neck pain 03/22/2022   Mass of right side of neck    Anemia 02/27/2022   Tonsillar mass 02/27/2022   Overweight (BMI 25.0-29.9) 02/27/2022   Syncope 02/26/2022   Pain in joint of left elbow 01/10/2022   Chronic obstructive pulmonary disease ([place]) 06/17/2020   Prostate cancer ([place]) 03/31/2019   Mass of upper lobe of left lung 12/02/2017   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   Tobacco use disorder 11/06/2017   Cervical spondylosis without myelopathy 08/27/2017   Facet arthritis of cervical region 08/27/2017   Obstructive sleep apnea 03/13/2017   Pulmonary hypertension, unspecified ([place]) 01/02/2017   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   Essential hypertension 12/27/2016   Tachycardia 12/27/2016    PCP: Mechele Claude, FNP   REFERRING PROVIDER: [name] MD    REFERRING DIAG: C09.9 (ICD-10-CM) - 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[place])   THERAPY DIAG:  No diagnosis found.  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 Recommend concurrent chemotherapy with radiation. Labs are reviewed and discussed with patient. Proceed with carboplatin AUC 2   OT SCREEN 05/23/22: Patient arrive with reports of having trouble swallowing as well as posterior neck pain.  Neck feeling heavy at times during the day hard time keeping it up.   Patient receiving radiation mostly on the right side of the neck.  Reports increased tightness and pain with motion. Upon asking patient about referral to speech therapy.  Patient reports they called him but did not know they were addressing swallowing issues.  Request if they can contact him again because of swallowing issues. Message sent to SLP to call pt again. Patient report discomfort mostly on posterior and right side of neck. Patient show decreased cervical extension more than flexion  Rotation to the right and left 40 degrees. Lateral flexion to the right 30 into the left 20. Patient was educated in correct posture for sitting and sleeping.  Patient was propping neck up with 1 or 2 pillows pushing head and cervical flexion.  Educated on correct positioning when sitting up in bed watching TV or in recliner during the day. Patient is a side sleeper mostly. Educated patient on doing a small roll under her neck in supine doing gentle active range of motion in supine for cervical rotation, extension and lateral flexion. Reinforced pain-free gentle motion 6-8 reps 3 times a day. Patient wants to follow-up in 2 weeks - if continued to have issues we will request PT eval and treat order.       OT SCREEN 06/06/22: Patient follow-up today after being seen 2 weeks ago.  Referred to speech therapy.  Patient was evaluated and is followed by speech therapy.  Patient reports pain in his neck is better but still feel it on the posterior neck as well as  well as head gets tired at times. Pt is doing much better with how he is sitting and supine with his posture /positioning of neck. Patient do show increase cervical rotation and lateral flexion compared to 2 weeks ago. Rotation to the left 50, rotation to the right 55 degrees. Lateral flexion to the left 30 into the right 38. Did recommend for patient to maybe follow-up with physical therapy for evaluation and treat for cervical range of motion and  decreasing pain.  Patient in agreement. Patient did finish radiation but is scheduled for (per patient )a x-ray tomorrow.  PAIN:  Are you having pain? {OPRCPAIN:27236}  PRECAUTIONS: {Therapy precautions:24002}  WEIGHT BEARING RESTRICTIONS: No  FALLS:  Has patient fallen in last 6 months? {fallsyesno:27318}  LIVING ENVIRONMENT: Lives with: {OPRC lives with:25569::"lives with their family"} Lives in: {Lives in:25570} Stairs: {opstairs:27293} Has following equipment at home: {Assistive devices:23999}  OCCUPATION: ***  PLOF: {PLOF:24004}  PATIENT GOALS: ***  NEXT MD VISIT:   OBJECTIVE:   DIAGNOSTIC FINDINGS:  ***  PATIENT SURVEYS:  FOTO ***  COGNITION: Overall cognitive status: {cognition:24006}  SENSATION: {sensation:27233}  POSTURE: {posture:25561}  PALPATION: ***   CERVICAL ROM:   {AROM/PROM:27142} ROM A/PROM (deg) eval  Flexion   Extension   Right lateral flexion   Left lateral flexion   Right rotation   Left rotation    (Blank rows = not tested)  UPPER EXTREMITY ROM:  {AROM/PROM:27142} ROM Right eval Left eval  Shoulder  flexion    Shoulder extension    Shoulder abduction    Shoulder adduction    Shoulder extension    Shoulder internal rotation    Shoulder external rotation    Elbow flexion    Elbow extension    Wrist flexion    Wrist extension    Wrist ulnar deviation    Wrist radial deviation    Wrist pronation    Wrist supination     (Blank rows = not tested)  UPPER EXTREMITY MMT:  MMT Right eval Left eval  Shoulder flexion    Shoulder extension    Shoulder abduction    Shoulder adduction    Shoulder extension    Shoulder internal rotation    Shoulder external rotation    Middle trapezius    Lower trapezius    Elbow flexion    Elbow extension    Wrist flexion    Wrist extension    Wrist ulnar deviation    Wrist radial deviation    Wrist pronation    Wrist supination    Grip strength     (Blank rows = not  tested)  CERVICAL SPECIAL TESTS:  Spurling's test: {pos/neg:25243} and Distraction test: {pos/neg:25243}  FUNCTIONAL TESTS:  {Functional tests:24029}  TODAY'S TREATMENT:                                                                                                                              DATE: 06/07/22    PATIENT EDUCATION:  Education details: POC, eval findings  Person educated: Patient Education method: Explanation Education comprehension: verbalized understanding  HOME EXERCISE PROGRAM: ***  ASSESSMENT:  CLINICAL IMPRESSION: Patient is a *** y.o. *** who was seen today for physical therapy evaluation and treatment for ***.   OBJECTIVE IMPAIRMENTS: decreased activity tolerance, decreased ROM, hypomobility, impaired perceived functional ability, impaired flexibility, and pain.   ACTIVITY LIMITATIONS: {activitylimitations:27494}  PARTICIPATION LIMITATIONS: {participationrestrictions:25113}  PERSONAL FACTORS: 3+ comorbidities: DDD, HTN, COPD  are also affecting patient's functional outcome.   REHAB POTENTIAL: {rehabpotential:25112}  CLINICAL DECISION MAKING: {clinical decision making:25114}  EVALUATION COMPLEXITY: {Evaluation complexity:25115}   GOALS: Goals reviewed with patient? {yes/no:20286}  SHORT TERM GOALS: Target date: ***  *** Baseline: *** Goal status: {GOALSTATUS:25110}  2.  *** Baseline: *** Goal status: {GOALSTATUS:25110}  3.  *** Baseline: *** Goal status: {GOALSTATUS:25110}  4.  *** Baseline: *** Goal status: {GOALSTATUS:25110}  5.  *** Baseline: *** Goal status: {GOALSTATUS:25110}  6.  *** Baseline: *** Goal status: {GOALSTATUS:25110}  LONG TERM GOALS: Target date: ***  *** Baseline: *** Goal status: {GOALSTATUS:25110}  2.  *** Baseline: *** Goal status: {GOALSTATUS:25110}  3.  *** Baseline: *** Goal status: {GOALSTATUS:25110}  4.  *** Baseline: *** Goal status: {GOALSTATUS:25110}  5.  *** Baseline:  *** Goal status: {GOALSTATUS:25110}  6.  *** Baseline: *** Goal status: {GOALSTATUS:25110}   PLAN:  PT FREQUENCY: {rehab frequency:25116}  PT DURATION: {rehab duration:25117}  PLANNED INTERVENTIONS: {rehab planned interventions:25118::"Therapeutic exercises","Therapeutic  activity","Neuromuscular re-education","Balance training","Gait training","Patient/Family education","Self Care","Joint mobilization"}  PLAN FOR NEXT SESSION: ***   Particia Lather, PT 06/07/2022, 10:16 AM

## 2022-06-08 ENCOUNTER — Ambulatory Visit: Payer: Medicaid Other | Admitting: Physical Therapy

## 2022-06-11 ENCOUNTER — Telehealth: Payer: Self-pay | Admitting: *Deleted

## 2022-06-11 MED ORDER — BACITRACIN-POLYMYXIN B 500-10000 UNIT/GM OP OINT: 1.0000 | TOPICAL_OINTMENT | Freq: Two times a day (BID) | OPHTHALMIC | 0 refills | Status: DC

## 2022-06-11 NOTE — Telephone Encounter (Signed)
I spoke with patient by phone.  He reports developing red, scratchy eyes the end of last week with severe light sensitivity.  He feels like he has had some purulent drainage bilaterally.  He says that the light sensitivity has normalized but he still feels like the eyes are scratchy and he feels like he needs an antibiotic.  We discussed the importance of managing medical issues through a PCP office.  Patient is unhappy with his current PCP and request information on establishing a new PCP elsewhere.  Will send rx for Polysporin BID x 5 days.

## 2022-06-11 NOTE — Telephone Encounter (Signed)
Patient called asking to speak with Sharion Dove, NP because over the weekend, he developed sensitivity to light in his eyes and they became very red. It is better today except that his eyes are still red. Please return his call [phone]

## 2022-06-12 ENCOUNTER — Other Ambulatory Visit: Payer: Self-pay | Admitting: *Deleted

## 2022-06-12 DIAGNOSIS — Z758 Other problems related to medical facilities and other health care: Secondary | ICD-10-CM

## 2022-06-12 NOTE — Telephone Encounter (Signed)
Referral to pcp entered

## 2022-06-13 ENCOUNTER — Ambulatory Visit: Payer: Medicaid Other | Admitting: Speech Pathology

## 2022-06-13 ENCOUNTER — Other Ambulatory Visit: Payer: Medicaid Other

## 2022-06-13 ENCOUNTER — Inpatient Hospital Stay: Payer: Medicaid Other

## 2022-06-13 ENCOUNTER — Ambulatory Visit: Payer: Medicaid Other

## 2022-06-13 ENCOUNTER — Ambulatory Visit: Payer: Medicaid Other | Admitting: Dietician

## 2022-06-13 VITALS — BP 127/71 | HR 112 | Temp 97.1°F | Resp 20

## 2022-06-13 DIAGNOSIS — M542 Cervicalgia: Secondary | ICD-10-CM | POA: Diagnosis not present

## 2022-06-13 DIAGNOSIS — C099 Malignant neoplasm of tonsil, unspecified: Secondary | ICD-10-CM

## 2022-06-13 DIAGNOSIS — Z859 Personal history of malignant neoplasm, unspecified: Secondary | ICD-10-CM | POA: Diagnosis not present

## 2022-06-13 DIAGNOSIS — R1312 Dysphagia, oropharyngeal phase: Secondary | ICD-10-CM | POA: Diagnosis not present

## 2022-06-13 DIAGNOSIS — R1313 Dysphagia, pharyngeal phase: Secondary | ICD-10-CM

## 2022-06-13 DIAGNOSIS — C770 Secondary and unspecified malignant neoplasm of lymph nodes of head, face and neck: Secondary | ICD-10-CM | POA: Diagnosis not present

## 2022-06-13 DIAGNOSIS — F17210 Nicotine dependence, cigarettes, uncomplicated: Secondary | ICD-10-CM | POA: Diagnosis not present

## 2022-06-13 DIAGNOSIS — I10 Essential (primary) hypertension: Secondary | ICD-10-CM | POA: Diagnosis not present

## 2022-06-13 DIAGNOSIS — K1230 Oral mucositis (ulcerative), unspecified: Secondary | ICD-10-CM | POA: Diagnosis not present

## 2022-06-13 DIAGNOSIS — Z5111 Encounter for antineoplastic chemotherapy: Secondary | ICD-10-CM | POA: Diagnosis not present

## 2022-06-13 DIAGNOSIS — G8929 Other chronic pain: Secondary | ICD-10-CM | POA: Diagnosis not present

## 2022-06-13 LAB — CBC WITH DIFFERENTIAL/PLATELET
Abs Immature Granulocytes: 0.02 10*3/uL (ref 0.00–0.07)
Basophils Absolute: 0 10*3/uL (ref 0.0–0.1)
Basophils Relative: 0 %
Eosinophils Absolute: 0 10*3/uL (ref 0.0–0.5)
Eosinophils Relative: 0 %
HCT: 33 % — ABNORMAL LOW (ref 39.0–52.0)
Hemoglobin: 10.8 g/dL — ABNORMAL LOW (ref 13.0–17.0)
Immature Granulocytes: 1 %
Lymphocytes Relative: 9 %
Lymphs Abs: 0.3 10*3/uL — ABNORMAL LOW (ref 0.7–4.0)
MCH: 26.5 pg (ref 26.0–34.0)
MCHC: 32.7 g/dL (ref 30.0–36.0)
MCV: 80.9 fL (ref 80.0–100.0)
Monocytes Absolute: 0.2 10*3/uL (ref 0.1–1.0)
Monocytes Relative: 6 %
Neutro Abs: 2.9 10*3/uL (ref 1.7–7.7)
Neutrophils Relative %: 84 %
Platelets: 167 10*3/uL (ref 150–400)
RBC: 4.08 MIL/uL — ABNORMAL LOW (ref 4.22–5.81)
RDW: 19 % — ABNORMAL HIGH (ref 11.5–15.5)
WBC: 3.4 10*3/uL — ABNORMAL LOW (ref 4.0–10.5)
nRBC: 0 % (ref 0.0–0.2)

## 2022-06-13 LAB — COMPREHENSIVE METABOLIC PANEL
ALT: 55 U/L — ABNORMAL HIGH (ref 0–44)
AST: 31 U/L (ref 15–41)
Albumin: 3.7 g/dL (ref 3.5–5.0)
Alkaline Phosphatase: 89 U/L (ref 38–126)
Anion gap: 7 (ref 5–15)
BUN: 12 mg/dL (ref 6–20)
CO2: 23 mmol/L (ref 22–32)
Calcium: 8.8 mg/dL — ABNORMAL LOW (ref 8.9–10.3)
Chloride: 103 mmol/L (ref 98–111)
Creatinine, Ser: 0.77 mg/dL (ref 0.61–1.24)
GFR, Estimated: >60 mL/min (ref >60)
Glucose, Bld: 131 mg/dL — ABNORMAL HIGH (ref 70–99)
Potassium: 4 mmol/L (ref 3.5–5.1)
Sodium: 133 mmol/L — ABNORMAL LOW (ref 135–145)
Total Bilirubin: 0.4 mg/dL (ref 0.3–1.2)
Total Protein: 7.3 g/dL (ref 6.5–8.1)

## 2022-06-13 MED ORDER — SODIUM CHLORIDE 0.9 % IV SOLN
Freq: Once | INTRAVENOUS | Status: AC
  Filled 2022-06-13: qty 250

## 2022-06-13 MED ORDER — SODIUM CHLORIDE 0.9% FLUSH
10.0000 mL | Freq: Once | INTRAVENOUS | Status: AC
  Administered 2022-06-13: 10 mL via INTRAVENOUS
  Filled 2022-06-13: qty 10

## 2022-06-13 MED ORDER — HEPARIN SOD (PORK) LOCK FLUSH 100 UNIT/ML IV SOLN
500.0000 [IU] | Freq: Once | INTRAVENOUS | Status: AC
  Administered 2022-06-13: 500 [IU] via INTRAVENOUS
  Filled 2022-06-13: qty 5

## 2022-06-13 NOTE — Progress Notes (Signed)
Nutrition Follow-up:   Called patient on mobile to follow up on PO intake and NIS.  Patient reports appetite is good, constipation resolved and swallowing improving.  He is being followed by SLT who has cleared him for regular texture diet and thin liquids.  Trying not to skip meals, doing 2 ONS per day (using Ensure complete). He does report a supply issue and he will not have them available until beginning of next month.  He says he is running around for may appointments and will sometimes not have time for breakfast.  Encourage use of the ONS when that is a concern.   Medications: reviewed    Labs: reviewed, 06/13/22    Anthropometrics: patient reports his weight at home today 157   Weight:   06/13/22 157# (per patient report) 05/29/22  157 (radiation UT) 05/16/22   161# 05/09/22  164.8# 04/04/22 170 lb UBW of 176 lb  DBW:  180#  NUTRITION DIAGNOSIS: Inadequate oral intake stable      INTERVENTION:  Encouraged ONS if unable to  eating breakfast due to appointmens.  Encourage 2 ONS daily Goal of 2-5#/month   MONITORING, EVALUATION, GOAL: weight trends, intake     NEXT VISIT: remote follow up next month after infusion   [name], RDN, LDN Registered Dietitian, [place] Part Time Remote (Usual office hours: Tuesday-Thursday) Mobile: [phone]

## 2022-06-17 NOTE — Therapy (Signed)
OUTPATIENT SPEECH LANGUAGE PATHOLOGY TREATMENT NOTE   Patient Name: Clarence Skinner MRN: [id number] DOB:[birth year], 61 y.o., male Today's Date: 06/17/2022  PCP: Georgian Co, MD REFERRING PROVIDER: Altha Harm, NP  END OF SESSION:   End of Session - 06/17/22 2024     Visit Number 2    Number of Visits 17    Date for SLP Re-Evaluation 08/27/22    Authorization Type Medicaid [employer]    Progress Note Due on Visit 10    SLP Start Time 1210    SLP Stop Time  1255    SLP Time Calculation (min) 45 min    Activity Tolerance Patient tolerated treatment well             Past Medical History:  Diagnosis Date   COPD (chronic obstructive pulmonary disease) ([place])    GERD (gastroesophageal reflux disease)    Gout    Hypertension    Multilevel degenerative disc disease    Pneumonia 11/29/2017   Sleep apnea    CPAP   Tachycardia    Wears dentures    partial upper and lower   Past Surgical History:  Procedure Laterality Date   ABCESS DRAINAGE  2009   tonsil   COLONOSCOPY WITH PROPOFOL N/A 02/11/2018   Procedure: COLONOSCOPY WITH PROPOFOL;  Surgeon: Toledo, Benay Pike, MD;  Location: [facility];  Service: Endoscopy;  Laterality: N/A;   ESOPHAGOGASTRODUODENOSCOPY (EGD) WITH PROPOFOL N/A 02/11/2018   Procedure: ESOPHAGOGASTRODUODENOSCOPY (EGD) WITH PROPOFOL;  Surgeon: Toledo, Benay Pike, MD;  Location: [facility];  Service: Endoscopy;  Laterality: N/A;   IR IMAGING GUIDED PORT INSERTION  04/09/2022   MASS EXCISION Right 08/14/2017   Procedure: EXCISION RIGHT TEMPORAL MASS SUBMENTAL MASS;  Surgeon: Carloyn Manner, MD;  Location: [place];  Service: ENT;  Laterality: Right;  sleep apnea   RADIOACTIVE SEED IMPLANT N/A 01/26/2019   Procedure: RADIOACTIVE SEED IMPLANT/BRACHYTHERAPY IMPLANT;  Surgeon: Billey Co, MD;  Location: [facility];  Service: Urology;  Laterality: N/A;   SEPTOPLASTY  2016   [place], [place]   VOLUME STUDY N/A 12/29/2018   Procedure: VOLUME  STUDY;  Surgeon: Billey Co, MD;  Location: [facility];  Service: Urology;  Laterality: N/A;   Patient Active Problem List   Diagnosis Date Noted   Sinusitis, acute 05/30/2022   Mass of soft tissue 05/16/2022   Blurred vision 05/16/2022   Hypotension 05/09/2022   Thrush 05/09/2022   Mucositis 04/11/2022   Sinusitis, chronic 04/04/2022   Encounter for antineoplastic chemotherapy 03/28/2022   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[place]) 03/22/2022   Goals of care, counseling/discussion 03/22/2022   Carotid sinus syndrome 03/22/2022   Neck pain 03/22/2022   Mass of right side of neck    Anemia 02/27/2022   Tonsillar mass 02/27/2022   Overweight (BMI 25.0-29.9) 02/27/2022   Syncope 02/26/2022   Pain in joint of left elbow 01/10/2022   Chronic obstructive pulmonary disease ([place]) 06/17/2020   Prostate cancer ([place]) 03/31/2019   Mass of upper lobe of left lung 12/02/2017   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   Tobacco use disorder 11/06/2017   Cervical spondylosis without myelopathy 08/27/2017   Facet arthritis of cervical region 08/27/2017   Obstructive sleep apnea 03/13/2017   Pulmonary hypertension, unspecified ([place]) 01/02/2017   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   Essential hypertension 12/27/2016   Tachycardia 12/27/2016    ONSET DATE: 02/28/2022 date of diagnosis; 05/30/2022 date of this referral    REFERRING DIAG:  C09.9 (ICD-10-CM) - 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[place] )    PERTINENT HISTORY:  As per Dr. Jimmye Norman 8/9-8/10/23: Pt was found to have a tonsillar mass and had US guided lymph node biopsy by IR on 02/28/22. 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 Pt is current smoker of 1ppd since pt was in his 36s as per pt. Onco and radiation oncology evaluated the pt while inpatient.     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    Completed 70 Gray over 7 weeks with concurrent chemo  THERAPY DIAG:  Dysphagia, pharyngeal phase  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H[facility])  Rationale for Evaluation and Treatment Rehabilitation  SUBJECTIVE: pt pleasant, eager, motivated   Pt accompanied by: self  PAIN:  Are you having pain? No  PATIENT GOALS: to be able to swallow better  OBJECTIVE:   TODAY'S TREATMENT: Skilled treatment focused on reviewing video of pt's recent MBSS. SLP provided skilled information on all phases of swallow function with identification of swallow impairment, specifically pt's CP bar above the UES which squeezes each bolus in half.   Additional education provided on referral to ENT.      PATIENT EDUCATION: Education details: swallow function, CP bar, referral to ENT Person educated: Patient Education method: Explanation, Demonstration, Tactile cues, Verbal cues, and Handouts Education comprehension: verbalized understanding and returned demonstration  HOME EXERCISE PROGRAM:    GOALS: Goals reviewed with patient? Yes  SHORT TERM GOALS: Target date: 10 sessions  Patient will participate in objective swallowing evaluation (MBSS) to identify safest diet recommendation as well as therapeutic targets. Baseline: Goal status: ONGOING   LONG TERM GOALS: Target date: 08/28/2022 Patient will consume recommended diet using strategies and compensations without overt s/sx aspiration >95% of the time. Baseline: to be determined after completion of instrumental study Goal status: INITIAL   ASSESSMENT:  CLINICAL IMPRESSION: Pt continues to consume PO intake with stable weight. At this time, pt's most impactful impairment appears to be a prominent CP bar superior to his UES. Pt voices understanding and is in agreement with referral to ENT. Pt previously established with ENT, SLP will reach out.    OBJECTIVE IMPAIRMENTS include dysphagia. These impairments are limiting  patient from safety when swallowing. Factors affecting potential to achieve goals and functional outcome are medical prognosis. Patient will benefit from skilled SLP services to address above impairments and improve overall function.  REHAB POTENTIAL: Good  PLAN: SLP FREQUENCY: 1-2x/week  SLP DURATION: 12 weeks  PLANNED INTERVENTIONS: Aspiration precaution training, Pharyngeal strengthening exercises, SLP instruction and feedback, Compensatory strategies, and Patient/family education   [place] Petion B. Rutherford Nail, M.S., CCC-SLP, Mining engineer Certified Brain Injury [place]  [place] Office [phone] Ascom [phone] Fax [phone]

## 2022-06-18 ENCOUNTER — Other Ambulatory Visit: Payer: Medicaid Other

## 2022-06-18 ENCOUNTER — Ambulatory Visit: Payer: Medicaid Other

## 2022-06-18 NOTE — Therapy (Unsigned)
OUTPATIENT PHYSICAL THERAPY CERVICAL EVALUATION   Patient Name: Clarence Skinner MRN: [id number] DOB:[birth year], 61 y.o., male Today's Date: 06/18/2022  END OF SESSION:   Past Medical History:  Diagnosis Date   COPD (chronic obstructive pulmonary disease) ([facility])    GERD (gastroesophageal reflux disease)    Gout    Hypertension    Multilevel degenerative disc disease    Pneumonia 11/29/2017   Sleep apnea    CPAP   Tachycardia    Wears dentures    partial upper and lower   Past Surgical History:  Procedure Laterality Date   ABCESS DRAINAGE  2009   tonsil   COLONOSCOPY WITH PROPOFOL N/A 02/11/2018   Procedure: COLONOSCOPY WITH PROPOFOL;  Surgeon: Toledo, Benay Pike, MD;  Location: [facility];  Service: Endoscopy;  Laterality: N/A;   ESOPHAGOGASTRODUODENOSCOPY (EGD) WITH PROPOFOL N/A 02/11/2018   Procedure: ESOPHAGOGASTRODUODENOSCOPY (EGD) WITH PROPOFOL;  Surgeon: Toledo, Benay Pike, MD;  Location: [facility];  Service: Endoscopy;  Laterality: N/A;   IR IMAGING GUIDED PORT INSERTION  04/09/2022   MASS EXCISION Right 08/14/2017   Procedure: EXCISION RIGHT TEMPORAL MASS SUBMENTAL MASS;  Surgeon: Carloyn Manner, MD;  Location: [place];  Service: ENT;  Laterality: Right;  sleep apnea   RADIOACTIVE SEED IMPLANT N/A 01/26/2019   Procedure: RADIOACTIVE SEED IMPLANT/BRACHYTHERAPY IMPLANT;  Surgeon: Billey Co, MD;  Location: [facility];  Service: Urology;  Laterality: N/A;   SEPTOPLASTY  2016   [place], [place]   VOLUME STUDY N/A 12/29/2018   Procedure: VOLUME STUDY;  Surgeon: Billey Co, MD;  Location: [facility];  Service: Urology;  Laterality: N/A;   Patient Active Problem List   Diagnosis Date Noted   Sinusitis, acute 05/30/2022   Mass of soft tissue 05/16/2022   Blurred vision 05/16/2022   Hypotension 05/09/2022   Thrush 05/09/2022   Mucositis 04/11/2022   Sinusitis, chronic 04/04/2022   Encounter for antineoplastic chemotherapy 03/28/2022   Primary  tonsillar squamous cell carcinoma ([place]) 03/22/2022   Goals of care, counseling/discussion 03/22/2022   Carotid sinus syndrome 03/22/2022   Neck pain 03/22/2022   Mass of right side of neck    Anemia 02/27/2022   Tonsillar mass 02/27/2022   Overweight (BMI 25.0-29.9) 02/27/2022   Syncope 02/26/2022   Pain in joint of left elbow 01/10/2022   Chronic obstructive pulmonary disease ([place]) 06/17/2020   Prostate cancer ([place]) 03/31/2019   Mass of upper lobe of left lung 12/02/2017   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   Tobacco use disorder 11/06/2017   Cervical spondylosis without myelopathy 08/27/2017   Facet arthritis of cervical region 08/27/2017   Obstructive sleep apnea 03/13/2017   Pulmonary hypertension, unspecified ([place]) 01/02/2017   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   Essential hypertension 12/27/2016   Tachycardia 12/27/2016    PCP: Mechele Claude, FNP   REFERRING PROVIDER: [name] MD    REFERRING DIAG: C09.9 (ICD-10-CM) - 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[place])   THERAPY DIAG:  No diagnosis found.  Rationale for Evaluation and Treatment: Rehabilitation  ONSET DATE: 02/2022  SUBJECTIVE:  SUBJECTIVE STATEMENT: Pt reports neck pain since onset of cancer in August of 2023. Pt has pain primarily on the right side and in the posterior aspect of the neck. Pt previously had pain for at least last 5 years on the left side of his neck but the pain is now primarily on the right side of his neck. Pain is very limiting in his ability to complete daily activities without rest breaks. Pt has history of "degenerative changes in the neck" and cervical lymph node metastasis that impact his neck pain. Pt has history of headaches "  all the time".   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 Recommend concurrent chemotherapy with radiation.  OT SCREEN 05/23/22: Patient arrive with reports of having trouble swallowing as well as posterior neck pain.  Neck feeling heavy at times during the day hard time keeping it up.  Patient receiving radiation mostly on the right side of the neck.  Reports increased tightness and pain with motion. Upon asking patient about referral to speech therapy.  Patient reports they called him but did not know they were addressing swallowing issues.  Request if they can contact him again because of swallowing issues. Message sent to SLP to call pt again. Patient report discomfort mostly on posterior and right side of neck. Patient show decreased cervical extension more than flexion  Rotation to the right and left 40 degrees. Lateral flexion to the right 30 into the left 20. Patient was educated in correct posture for sitting and sleeping.  Patient was propping neck up with 1 or 2 pillows pushing head and cervical flexion.  Educated on correct positioning when sitting up in bed watching TV or in recliner during the day. Patient is a side sleeper mostly. Educated patient on doing a small roll under her neck in supine doing gentle active range of motion in supine for cervical rotation, extension and lateral flexion. Reinforced pain-free gentle motion 6-8 reps 3 times a day. Patient wants to follow-up in 2 weeks - if continued to have issues we will request PT eval and treat order.       OT SCREEN 06/06/22: Patient follow-up today after being seen 2 weeks ago.  Referred to speech therapy.  Patient was evaluated and is followed by speech therapy.  Patient reports pain in his neck is better but still feel it on the posterior neck as well as  well as head gets  tired at times. Pt is doing much better with how he is sitting and supine with his posture /positioning of neck. Patient do show increase cervical rotation and lateral flexion compared to 2 weeks ago. Rotation to the left 50, rotation to the right 55 degrees. Lateral flexion to the left 30 into the right 38. Did recommend for patient to maybe follow-up with physical therapy for evaluation and treat for cervical range of motion and decreasing pain.  Patient in agreement. Patient did finish radiation but is scheduled for (per patient )a x-ray tomorrow.  PAIN:  Are you having pain? Yes: NPRS scale: 4/10 Pain location: right lateral neck and posterior aspect of neck  Pain description: heavy, cramping type of main Aggravating factors: looking down and gravity dependent position Relieving factors: lies , medication but cannot use due to cancer , hat not used ice or heat due to cancer.   PRECAUTIONS: Other: cancer, MD  recommends no heat until follow up pt uncomfortable with heat applied to this area.   WEIGHT BEARING RESTRICTIONS: No  FALLS:  Has patient fallen in last 6 months? No  LIVING ENVIRONMENT: Lives with: lives alone Lives in: House/apartment Stairs: No Has following equipment at home: None  OCCUPATION: retired   PLOF: Independent and has pain limiting his ability to complete daily activities, used to like to go fishing but has not been able due to neck. Had pain on the left sid eprior to cancer that also got up to 10.   PATIENT GOALS: Improve pain levels.   NEXT MD VISIT:   OBJECTIVE:   DIAGNOSTIC FINDINGS:  Neck soft tissue only MRI cervical region on 02/27/22 IMPRESSION: Constellation of findings most compatible with a roughly 2.4 cm Right Tonsillar Carcinoma (series 12, image 11) with associated right level 2 and level 3 malignant nodal, ex-nodal disease (series 14, image 13). Recommend ENT consultation.  PATIENT SURVEYS:  FOTO 26 risk adjusted goal of  42   COGNITION: Overall cognitive status: Within functional limits for tasks assessed  SENSATION: WFL  POSTURE: rounded shoulders, forward head, and posterior pelvic tilt  PALPATION: TTP on bilateral suboccipital musculature, cervical paraspinals and R > L upper trapezius musculature.    CERVICAL ROM:   Active ROM A/PROM (deg) eval  Flexion 52*  Extension 31  Right lateral flexion 15  Left lateral flexion 11*  Right rotation 39  Left rotation 41  Thoracic rotation left  Limited*  Thoracic rotation right  WNL   (Blank rows = not tested) *pain   UPPER EXTREMITY ROM:  Active ROM Right eval Left eval  Shoulder flexion WNL WNL   Shoulder extension WNL WNL  Shoulder abduction WNL Limited   Shoulder adduction WNL WNL  Shoulder extension    Shoulder internal rotation    Shoulder external rotation    Elbow flexion    Elbow extension    Wrist flexion    Wrist extension    Wrist ulnar deviation    Wrist radial deviation    Wrist pronation    Wrist supination     (Blank rows = not tested)  UPPER EXTREMITY MMT:  MMT Right eval Left eval  Shoulder flexion WNL 3+  Shoulder extension WNL 5*  Shoulder abduction WNL 3+  Shoulder adduction WNL 5*  Shoulder extension WNL 5*  Shoulder internal rotation    Shoulder external rotation    Middle trapezius    Lower trapezius     (Blank rows = not tested) * not measured against gravity   CERVICAL SPECIAL TESTS:  Spurling's test: Negative and Distraction test: Positive  FUNCTIONAL TESTS:  NDI: 33  TODAY'S TREATMENT:                                                                                                                              DATE: 06/18/22    PATIENT EDUCATION:  Education details: POC, eval findings  Person educated: Patient Education method: Explanation Education comprehension: verbalized understanding  HOME EXERCISE PROGRAM: To begin next session, UT and levator stretch, postural muscle activation  activities   ASSESSMENT:  CLINICAL IMPRESSION: Patient is a 61 y.o. male who was seen today for physical therapy evaluation and treatment for right-sided neck pain that began in August of this year.  Patient has history of cervical lymph node metastasis as well as chronic left-sided neck pain for approximately 5 years.  Patient is currently limited by this neck pain and his ability to perform functional activities that involve standing and weightbearing position as well as looking down such as to read a book, preparing a meal, or wash hands.  Patient does present with impairments in cervical range of motion as well as impairments in right-sided shoulder strength addition to postural impairments that may be limiting patient's overall functional capacity.  Patient's scores on neck disability index as well as focus on therapeutic outcomes surveys indicate significant subjective limitation in overall function due to his neck pain.  Patient will benefit from skilled physical therapy intervention to improve his pain in his neck, his neck range of motion, and improve his ability to complete functional activities.  OBJECTIVE IMPAIRMENTS: decreased activity tolerance, decreased ROM, hypomobility, impaired perceived functional ability, impaired flexibility, and pain.   ACTIVITY LIMITATIONS: carrying, sitting, and standing  PARTICIPATION LIMITATIONS: meal prep, cleaning, laundry, driving, and community activity  PERSONAL FACTORS: 3+ comorbidities: DDD, HTN, COPD  are also affecting patient's functional outcome.   REHAB POTENTIAL: Fair chronicity of left sided neck pain (>5 years of significantly limiting pain) and ongoing uncertainty with cancer progression  CLINICAL DECISION MAKING: Evolving/moderate complexity  EVALUATION COMPLEXITY: Moderate   GOALS: Goals reviewed with patient? Yes  SHORT TERM GOALS: Target date: 07/17/2022    Patient will be independent with home exercise program in order to  improve neck pain and range of motion Baseline: No Home exercise program at this time Goal status: INITIAL   LONG TERM GOALS: Target date: 08/14/2022    Patient will improve cervical range of motion by 15 degrees with lateral flexion by 10 degrees with cervical rotation in order to improve patient's function Baseline: See evaluation table Goal status: INITIAL  2.  Patient will improve focus on therapeutic outcomes survey to 42 or greater to indicate improved subjective rating of neck function and pain. Baseline: 26 Goal status: INITIAL  3.  Patient will improve neck disability index score by 10 points or greater in order to indicate statistically significant improvement in neck related functional capacity. Baseline: 33 Goal status: INITIAL  4.  Patient will improve right shoulder flexion and abduction strength to 4 out of 5 or greater in order to indicate improved strength on the right side and improved lead to complete functional activities with the right upper extremity without pain or limitation Baseline: See evaluation chart Goal status: INITIAL     PLAN:  PT FREQUENCY: 2x/week  PT DURATION: 8 weeks  PLANNED INTERVENTIONS: Therapeutic exercises, Therapeutic activity, Neuromuscular re-education, Balance training, Gait training, Patient/Family education, Self Care, Joint mobilization, Joint manipulation, Spinal mobilization, and Manual therapy  PLAN FOR NEXT SESSION: Initiate home exercise program, manual therapy for improvement in pain   Particia Lather, PT 06/18/2022, 2:36 PM

## 2022-06-19 ENCOUNTER — Ambulatory Visit: Payer: Medicaid Other | Admitting: Physical Therapy

## 2022-06-19 ENCOUNTER — Encounter: Payer: Self-pay | Admitting: Physical Therapy

## 2022-06-19 DIAGNOSIS — R1313 Dysphagia, pharyngeal phase: Secondary | ICD-10-CM | POA: Diagnosis not present

## 2022-06-19 DIAGNOSIS — Z7689 Persons encountering health services in other specified circumstances: Secondary | ICD-10-CM | POA: Diagnosis not present

## 2022-06-19 DIAGNOSIS — R1312 Dysphagia, oropharyngeal phase: Secondary | ICD-10-CM | POA: Diagnosis not present

## 2022-06-19 DIAGNOSIS — M542 Cervicalgia: Secondary | ICD-10-CM

## 2022-06-19 DIAGNOSIS — G8929 Other chronic pain: Secondary | ICD-10-CM

## 2022-06-19 DIAGNOSIS — C099 Malignant neoplasm of tonsil, unspecified: Secondary | ICD-10-CM | POA: Diagnosis not present

## 2022-06-19 DIAGNOSIS — Z859 Personal history of malignant neoplasm, unspecified: Secondary | ICD-10-CM | POA: Diagnosis not present

## 2022-06-20 ENCOUNTER — Encounter: Payer: Medicaid Other | Admitting: Dietician

## 2022-06-22 ENCOUNTER — Ambulatory Visit: Payer: Medicaid Other | Attending: Hospice and Palliative Medicine | Admitting: Physical Therapy

## 2022-06-22 ENCOUNTER — Encounter: Payer: Self-pay | Admitting: Physical Therapy

## 2022-06-22 DIAGNOSIS — G8929 Other chronic pain: Secondary | ICD-10-CM | POA: Insufficient documentation

## 2022-06-22 DIAGNOSIS — M542 Cervicalgia: Secondary | ICD-10-CM | POA: Diagnosis not present

## 2022-06-22 DIAGNOSIS — Z419 Encounter for procedure for purposes other than remedying health state, unspecified: Secondary | ICD-10-CM | POA: Diagnosis not present

## 2022-06-22 NOTE — Therapy (Signed)
OUTPATIENT PHYSICAL THERAPY CERVICAL TREATMENT    Patient Name: Clarence Skinner MRN: [id number] DOB:[birth year], 61 y.o., male Today's Date: 06/22/2022  END OF SESSION:  PT End of Session - 06/22/22 1012     Visit Number 2    Number of Visits 16    Date for PT Re-Evaluation 08/14/22    Authorization Type Medicaid    Authorization - Visit Number 2    Authorization - Number of Visits 10    Progress Note Due on Visit 10    PT Start Time 6834    PT Stop Time 1100    PT Time Calculation (min) 45 min    Activity Tolerance Patient tolerated treatment well             Past Medical History:  Diagnosis Date   COPD (chronic obstructive pulmonary disease) ([place])    GERD (gastroesophageal reflux disease)    Gout    Hypertension    Multilevel degenerative disc disease    Pneumonia 11/29/2017   Sleep apnea    CPAP   Tachycardia    Wears dentures    partial upper and lower   Past Surgical History:  Procedure Laterality Date   ABCESS DRAINAGE  2009   tonsil   COLONOSCOPY WITH PROPOFOL N/A 02/11/2018   Procedure: COLONOSCOPY WITH PROPOFOL;  Surgeon: Toledo, Benay Pike, MD;  Location: [facility];  Service: Endoscopy;  Laterality: N/A;   ESOPHAGOGASTRODUODENOSCOPY (EGD) WITH PROPOFOL N/A 02/11/2018   Procedure: ESOPHAGOGASTRODUODENOSCOPY (EGD) WITH PROPOFOL;  Surgeon: Toledo, Benay Pike, MD;  Location: [facility];  Service: Endoscopy;  Laterality: N/A;   IR IMAGING GUIDED PORT INSERTION  04/09/2022   MASS EXCISION Right 08/14/2017   Procedure: EXCISION RIGHT TEMPORAL MASS SUBMENTAL MASS;  Surgeon: Carloyn Manner, MD;  Location: [place];  Service: ENT;  Laterality: Right;  sleep apnea   RADIOACTIVE SEED IMPLANT N/A 01/26/2019   Procedure: RADIOACTIVE SEED IMPLANT/BRACHYTHERAPY IMPLANT;  Surgeon: Billey Co, MD;  Location: [facility];  Service: Urology;  Laterality: N/A;   SEPTOPLASTY  2016   [place]   VOLUME STUDY N/A 12/29/2018   Procedure: VOLUME STUDY;   Surgeon: Billey Co, MD;  Location: [facility];  Service: Urology;  Laterality: N/A;   Patient Active Problem List   Diagnosis Date Noted   Sinusitis, acute 05/30/2022   Mass of soft tissue 05/16/2022   Blurred vision 05/16/2022   Hypotension 05/09/2022   Thrush 05/09/2022   Mucositis 04/11/2022   Sinusitis, chronic 04/04/2022   Encounter for antineoplastic chemotherapy 03/28/2022   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[place]) 03/22/2022   Goals of care, counseling/discussion 03/22/2022   Carotid sinus syndrome 03/22/2022   Neck pain 03/22/2022   Mass of right side of neck    Anemia 02/27/2022   Tonsillar mass 02/27/2022   Overweight (BMI 25.0-29.9) 02/27/2022   Syncope 02/26/2022   Pain in joint of left elbow 01/10/2022   Chronic obstructive pulmonary disease ([place]) 06/17/2020   Prostate cancer ([place]) 03/31/2019   Mass of upper lobe of left lung 12/02/2017   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   Tobacco use disorder 11/06/2017   Cervical spondylosis without myelopathy 08/27/2017   Facet arthritis of cervical region 08/27/2017   Obstructive sleep apnea 03/13/2017   Pulmonary hypertension, unspecified ([place]) 01/02/2017   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   Essential hypertension 12/27/2016   Tachycardia 12/27/2016    PCP: Mechele Claude, FNP   REFERRING PROVIDER: [name]  MD    REFERRING DIAG: C09.9 (ICD-10-CM) - 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HCC)   THERAPY DIAG:  Cervicalgia  Neck pain on right side  Chronic neck pain  Rationale for Evaluation and Treatment: Rehabilitation  ONSET DATE: 02/2022  SUBJECTIVE:                                                                                                                                                                                                         SUBJECTIVE STATEMENT: Pt reports neck pain improvement this date with improved range  of motion. Some pain still in posterior neck with prolonged looking down.   PERTINENT HISTORY:  Pt reports neck pain since onset of cancer in August of 2023. Pt has pain primarily on the right side and in the posterior aspect of the neck. Pt previously had pain for at least last 5 years on the left side of his neck but the pain is now primarily on the right side of his neck. Pain is very limiting in his ability to complete daily activities without rest breaks. Pt has history of "degenerative changes in the neck" and cervical lymph node metastasis that impact his neck pain. Pt has history of headaches " all the time".    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 Recommend concurrent chemotherapy with radiation.  OT SCREEN 05/23/22: Patient arrive with reports of having trouble swallowing as well as posterior neck pain.  Neck feeling heavy at times during the day hard time keeping it up.  Patient receiving radiation mostly on the right side of the neck.  Reports increased tightness and pain with motion. Upon asking patient about referral to speech therapy.  Patient reports they called him but did not know they were addressing swallowing issues.  Request if they can contact him again because of swallowing issues. Message sent to SLP to call pt again. Patient report discomfort mostly on posterior and right side of neck. Patient show decreased cervical extension more than flexion  Rotation to the right and left 40 degrees. Lateral flexion to the right 30 into the left 20. Patient was educated in correct posture for sitting and sleeping.  Patient was propping neck up with 1 or 2 pillows pushing head and cervical flexion.  Educated on correct positioning when sitting up in bed watching TV or in recliner during the day. Patient is a side sleeper mostly. Educated patient on doing a  small roll  under her neck in supine doing gentle active range of motion in supine for cervical rotation, extension and lateral flexion. Reinforced pain-free gentle motion 6-8 reps 3 times a day. Patient wants to follow-up in 2 weeks - if continued to have issues we will request PT eval and treat order.       OT SCREEN 06/06/22: Patient follow-up today after being seen 2 weeks ago.  Referred to speech therapy.  Patient was evaluated and is followed by speech therapy.  Patient reports pain in his neck is better but still feel it on the posterior neck as well as  well as head gets tired at times. Pt is doing much better with how he is sitting and supine with his posture /positioning of neck. Patient do show increase cervical rotation and lateral flexion compared to 2 weeks ago. Rotation to the left 50, rotation to the right 55 degrees. Lateral flexion to the left 30 into the right 38. Did recommend for patient to maybe follow-up with physical therapy for evaluation and treat for cervical range of motion and decreasing pain.  Patient in agreement. Patient did finish radiation but is scheduled for (per patient )a x-ray tomorrow.  PAIN:  Are you having pain? Yes: NPRS scale: 0/10 Pain location:   Pain description:  Aggravating factors: Relieving factors:   PRECAUTIONS: Other: cancer, MD recommends no heat until follow up pt uncomfortable with heat applied to this area.   WEIGHT BEARING RESTRICTIONS: No  FALLS:  Has patient fallen in last 6 months? No  LIVING ENVIRONMENT: Lives with: lives alone Lives in: House/apartment Stairs: No Has following equipment at home: None  OCCUPATION: retired   PLOF: Independent and has pain limiting his ability to complete daily activities, used to like to go fishing but has not been able due to neck. Had pain on the left sid eprior to cancer that also got up to 10.   PATIENT GOALS: Improve pain levels.   NEXT MD VISIT:   OBJECTIVE:   DIAGNOSTIC  FINDINGS:  Neck soft tissue only MRI cervical region on 02/27/22 IMPRESSION: Constellation of findings most compatible with a roughly 2.4 cm Right Tonsillar Carcinoma (series 12, image 11) with associated right level 2 and level 3 malignant nodal, ex-nodal disease (series 14, image 13). Recommend ENT consultation.  PATIENT SURVEYS:  FOTO 26 risk adjusted goal of 42   COGNITION: Overall cognitive status: Within functional limits for tasks assessed  SENSATION: WFL  POSTURE: rounded shoulders, forward head, and posterior pelvic tilt  PALPATION: TTP on bilateral suboccipital musculature, cervical paraspinals and R > L upper trapezius musculature.    CERVICAL ROM:   Active ROM A/PROM (deg) eval  Flexion 52*  Extension 31  Right lateral flexion 15  Left lateral flexion 11*  Right rotation 39  Left rotation 41  Thoracic rotation left  Limited*  Thoracic rotation right  WNL   (Blank rows = not tested) *pain   UPPER EXTREMITY ROM:  Active ROM Right eval Left eval  Shoulder flexion WNL WNL   Shoulder extension WNL WNL  Shoulder abduction WNL Limited   Shoulder adduction WNL WNL  Shoulder extension    Shoulder internal rotation    Shoulder external rotation    Elbow flexion    Elbow extension    Wrist flexion    Wrist extension    Wrist ulnar deviation    Wrist radial deviation    Wrist pronation    Wrist supination     (Blank rows =  not tested)  UPPER EXTREMITY MMT:  MMT Right eval Left eval  Shoulder flexion WNL 3+  Shoulder extension WNL 5*  Shoulder abduction WNL 3+  Shoulder adduction WNL 5*  Shoulder extension WNL 5*  Shoulder internal rotation    Shoulder external rotation    Middle trapezius    Lower trapezius     (Blank rows = not tested) * not measured against gravity   CERVICAL SPECIAL TESTS:  Spurling's test: Negative and Distraction test: Positive  FUNCTIONAL TESTS:  NDI: 33  TODAY'S TREATMENT:                                                                                                                               DATE: 06/22/22  Seated scapular retractions 15 x 5 sec holds  Supine peanut suboccipital release x 2 minutes   UT stretch on R with GH depression x 45 seconds   Sidebending muscle activation isometrics 10 x 5 sec ea side.   Thoracic extensions on foam roller 10 x 5 seocnds, significant limitation of thoracic extension in thi sposition.   Sidelying thoracic rotation ( open book) 10 x 5 sec on ea side   Ballroll out for thoracic rotation 15 x 5 sec holds, pt reports feels very good on shoulders  Manual:   Supine subociital release x 45 seconds   Pec minor release on R x 30 seconds   Prone rhomboid (R) ishcemic trigger point release and IASTM x several minutes    PATIENT EDUCATION:  Education details: PHEP Person educated: Patient Education method: Explanation Education comprehension: verbalized understanding  HOME EXERCISE PROGRAM: To begin next session, UT and levator stretch, postural muscle activation activities   ASSESSMENT:  CLINICAL IMPRESSION: Pt presents for initial treatment session . Pt starts with HEP with good performance with instruction. Pt reports improvement in UE mobility and shoulder comfort with ball roll out activity. Pt demonstrated very limited thoracic extension with activities and this will be a good target for future treatment along with progressive strengthening as pt pain tolerates on given day. Pt will continue to benefit from skilled physical therapy intervention to address impairments, improve QOL, and attain therapy goals.     OBJECTIVE IMPAIRMENTS: decreased activity tolerance, decreased ROM, hypomobility, impaired perceived functional ability, impaired flexibility, and pain.   ACTIVITY LIMITATIONS: carrying, sitting, and standing  PARTICIPATION LIMITATIONS: meal prep, cleaning, laundry, driving, and community activity  PERSONAL FACTORS: 3+ comorbidities: DDD,  HTN, COPD  are also affecting patient's functional outcome.   REHAB POTENTIAL: Fair chronicity of left sided neck pain (>5 years of significantly limiting pain) and ongoing uncertainty with cancer progression  CLINICAL DECISION MAKING: Evolving/moderate complexity  EVALUATION COMPLEXITY: Moderate   GOALS: Goals reviewed with patient? Yes  SHORT TERM GOALS: Target date: 07/17/2022    Patient will be independent with home exercise program in order to improve neck pain and range of motion Baseline: No Home exercise program at this time  Goal status: INITIAL   LONG TERM GOALS: Target date: 08/14/2022    Patient will improve cervical range of motion by 15 degrees with lateral flexion by 10 degrees with cervical rotation in order to improve patient's function Baseline: See evaluation table Goal status: INITIAL  2.  Patient will improve focus on therapeutic outcomes survey to 42 or greater to indicate improved subjective rating of neck function and pain. Baseline: 26 Goal status: INITIAL  3.  Patient will improve neck disability index score by 10 points or greater in order to indicate statistically significant improvement in neck related functional capacity. Baseline: 33 Goal status: INITIAL  4.  Patient will improve right shoulder flexion and abduction strength to 4 out of 5 or greater in order to indicate improved strength on the right side and improved lead to complete functional activities with the right upper extremity without pain or limitation Baseline: See evaluation chart Goal status: INITIAL     PLAN:  PT FREQUENCY: 2x/week  PT DURATION: 8 weeks  PLANNED INTERVENTIONS: Therapeutic exercises, Therapeutic activity, Neuromuscular re-education, Balance training, Gait training, Patient/Family education, Self Care, Joint mobilization, Joint manipulation, Spinal mobilization, and Manual therapy  PLAN FOR NEXT SESSION: progress  home exercise program if appropriate,  manual therapy for improvement in pain, continue POC for cervical and thoracic mobility and cervical stability    Particia Lather, PT 06/22/2022, 11:06 AM

## 2022-06-26 ENCOUNTER — Ambulatory Visit: Payer: Medicaid Other | Admitting: Physical Therapy

## 2022-06-26 ENCOUNTER — Encounter: Payer: Self-pay | Admitting: Physical Therapy

## 2022-06-26 DIAGNOSIS — M542 Cervicalgia: Secondary | ICD-10-CM | POA: Diagnosis not present

## 2022-06-26 DIAGNOSIS — G8929 Other chronic pain: Secondary | ICD-10-CM

## 2022-06-26 DIAGNOSIS — Z7689 Persons encountering health services in other specified circumstances: Secondary | ICD-10-CM | POA: Diagnosis not present

## 2022-06-26 NOTE — Therapy (Signed)
OUTPATIENT PHYSICAL THERAPY CERVICAL TREATMENT    Patient Name: Clarence Skinner MRN: [id number] DOB:22-Aug-1960, 61 y.o., male Today's Date: 06/26/2022  END OF SESSION:  PT End of Session - 06/26/22 0849     Visit Number 3    Number of Visits 16    Date for PT Re-Evaluation 08/14/22    Authorization Type Medicaid    Authorization - Visit Number 3    Authorization - Number of Visits 10    Progress Note Due on Visit 10    PT Start Time 0847    PT Stop Time 0928    PT Time Calculation (min) 41 min    Activity Tolerance Patient tolerated treatment well              Past Medical History:  Diagnosis Date   COPD (chronic obstructive pulmonary disease) ([place])    GERD (gastroesophageal reflux disease)    Gout    Hypertension    Multilevel degenerative disc disease    Pneumonia 11/29/2017   Sleep apnea    CPAP   Tachycardia    Wears dentures    partial upper and lower   Past Surgical History:  Procedure Laterality Date   ABCESS DRAINAGE  2009   tonsil   COLONOSCOPY WITH PROPOFOL N/A 02/11/2018   Procedure: COLONOSCOPY WITH PROPOFOL;  Surgeon: Toledo, Benay Pike, MD;  Location: [facility];  Service: Endoscopy;  Laterality: N/A;   ESOPHAGOGASTRODUODENOSCOPY (EGD) WITH PROPOFOL N/A 02/11/2018   Procedure: ESOPHAGOGASTRODUODENOSCOPY (EGD) WITH PROPOFOL;  Surgeon: Toledo, Benay Pike, MD;  Location: [facility];  Service: Endoscopy;  Laterality: N/A;   IR IMAGING GUIDED PORT INSERTION  04/09/2022   MASS EXCISION Right 08/14/2017   Procedure: EXCISION RIGHT TEMPORAL MASS SUBMENTAL MASS;  Surgeon: Carloyn Manner, MD;  Location: [place];  Service: ENT;  Laterality: Right;  sleep apnea   RADIOACTIVE SEED IMPLANT N/A 01/26/2019   Procedure: RADIOACTIVE SEED IMPLANT/BRACHYTHERAPY IMPLANT;  Surgeon: Billey Co, MD;  Location: [facility];  Service: Urology;  Laterality: N/A;   SEPTOPLASTY  2016   [place]   VOLUME STUDY N/A 12/29/2018   Procedure: VOLUME STUDY;   Surgeon: Billey Co, MD;  Location: [facility];  Service: Urology;  Laterality: N/A;   Patient Active Problem List   Diagnosis Date Noted   Sinusitis, acute 05/30/2022   Mass of soft tissue 05/16/2022   Blurred vision 05/16/2022   Hypotension 05/09/2022   Thrush 05/09/2022   Mucositis 04/11/2022   Sinusitis, chronic 04/04/2022   Encounter for antineoplastic chemotherapy 03/28/2022   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[place]) 03/22/2022   Goals of care, counseling/discussion 03/22/2022   Carotid sinus syndrome 03/22/2022   Neck pain 03/22/2022   Mass of right side of neck    Anemia 02/27/2022   Tonsillar mass 02/27/2022   Overweight (BMI 25.0-29.9) 02/27/2022   Syncope 02/26/2022   Pain in joint of left elbow 01/10/2022   Chronic obstructive pulmonary disease ([place]) 06/17/2020   Prostate cancer ([place]) 03/31/2019   Mass of upper lobe of left lung 12/02/2017   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   Tobacco use disorder 11/06/2017   Cervical spondylosis without myelopathy 08/27/2017   Facet arthritis of cervical region 08/27/2017   Obstructive sleep apnea 03/13/2017   Pulmonary hypertension, unspecified ([place]) 01/02/2017   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   Essential hypertension 12/27/2016   Tachycardia 12/27/2016    PCP: Mechele Claude, FNP   REFERRING PROVIDER: Tasia Catchings,  Zhou MD    REFERRING DIAG: C09.9 (ICD-10-CM) - 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HCC)   THERAPY DIAG:  Cervicalgia  Neck pain on right side  Chronic neck pain  Rationale for Evaluation and Treatment: Rehabilitation  ONSET DATE: 02/2022  SUBJECTIVE:                                                                                                                                                                                                         SUBJECTIVE STATEMENT: Pt reports neck pain improvement this date but still has pain  when looking down and pain on the right lateral side of his neck in area where cervical lymph metastasis was/is causing pain and discomfort.    PERTINENT HISTORY:  Pt reports neck pain since onset of cancer in August of 2023. Pt has pain primarily on the right side and in the posterior aspect of the neck. Pt previously had pain for at least last 5 years on the left side of his neck but the pain is now primarily on the right side of his neck. Pain is very limiting in his ability to complete daily activities without rest breaks. Pt has history of "degenerative changes in the neck" and cervical lymph node metastasis that impact his neck pain. Pt has history of headaches " all the time".    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 Recommend concurrent chemotherapy with radiation.  OT SCREEN 05/23/22: Patient arrive with reports of having trouble swallowing as well as posterior neck pain.  Neck feeling heavy at times during the day hard time keeping it up.  Patient receiving radiation mostly on the right side of the neck.  Reports increased tightness and pain with motion. Upon asking patient about referral to speech therapy.  Patient reports they called him but did not know they were addressing swallowing issues.  Request if they can contact him again because of swallowing issues. Message sent to SLP to call pt again. Patient report discomfort mostly on posterior and right side of neck. Patient show decreased cervical extension more than flexion  Rotation to the right and left 40 degrees. Lateral flexion to the right 30 into the left 20. Patient was educated in correct posture for sitting and sleeping.  Patient was propping neck up with 1 or 2 pillows pushing head and cervical flexion.  Educated on correct positioning when sitting up in bed watching TV or in recliner during the  day. Patient is a side sleeper mostly. Educated patient on doing a small roll under her neck in supine doing gentle active range of motion in supine for cervical rotation, extension and lateral flexion. Reinforced pain-free gentle motion 6-8 reps 3 times a day. Patient wants to follow-up in 2 weeks - if continued to have issues we will request PT eval and treat order.       OT SCREEN 06/06/22: Patient follow-up today after being seen 2 weeks ago.  Referred to speech therapy.  Patient was evaluated and is followed by speech therapy.  Patient reports pain in his neck is better but still feel it on the posterior neck as well as  well as head gets tired at times. Pt is doing much better with how he is sitting and supine with his posture /positioning of neck. Patient do show increase cervical rotation and lateral flexion compared to 2 weeks ago. Rotation to the left 50, rotation to the right 55 degrees. Lateral flexion to the left 30 into the right 38. Did recommend for patient to maybe follow-up with physical therapy for evaluation and treat for cervical range of motion and decreasing pain.  Patient in agreement. Patient did finish radiation but is scheduled for (per patient )a x-ray tomorrow.  PAIN:  Are you having pain? Yes: NPRS scale: 0/10 Pain location:   Pain description:  Aggravating factors: Relieving factors:   PRECAUTIONS: Other: cancer, MD recommends no heat until follow up pt uncomfortable with heat applied to this area.   WEIGHT BEARING RESTRICTIONS: No  FALLS:  Has patient fallen in last 6 months? No  LIVING ENVIRONMENT: Lives with: lives alone Lives in: House/apartment Stairs: No Has following equipment at home: None  OCCUPATION: retired   PLOF: Independent and has pain limiting his ability to complete daily activities, used to like to go fishing but has not been able due to neck. Had pain on the left sid eprior to cancer that also got up to 10.   PATIENT GOALS:  Improve pain levels.   NEXT MD VISIT:   OBJECTIVE:   DIAGNOSTIC FINDINGS:  Neck soft tissue only MRI cervical region on 02/27/22 IMPRESSION: Constellation of findings most compatible with a roughly 2.4 cm Right Tonsillar Carcinoma (series 12, image 11) with associated right level 2 and level 3 malignant nodal, ex-nodal disease (series 14, image 13). Recommend ENT consultation.  PATIENT SURVEYS:  FOTO 26 risk adjusted goal of 42   COGNITION: Overall cognitive status: Within functional limits for tasks assessed  SENSATION: WFL  POSTURE: rounded shoulders, forward head, and posterior pelvic tilt  PALPATION: TTP on bilateral suboccipital musculature, cervical paraspinals and R > L upper trapezius musculature.    CERVICAL ROM:   Active ROM A/PROM (deg) eval  Flexion 52*  Extension 31  Right lateral flexion 15  Left lateral flexion 11*  Right rotation 39  Left rotation 41  Thoracic rotation left  Limited*  Thoracic rotation right  WNL   (Blank rows = not tested) *pain   UPPER EXTREMITY ROM:  Active ROM Right eval Left eval  Shoulder flexion WNL WNL   Shoulder extension WNL WNL  Shoulder abduction WNL Limited   Shoulder adduction WNL WNL  Shoulder extension    Shoulder internal rotation    Shoulder external rotation    Elbow flexion    Elbow extension    Wrist flexion    Wrist extension    Wrist ulnar deviation    Wrist radial deviation  Wrist pronation    Wrist supination     (Blank rows = not tested)  UPPER EXTREMITY MMT:  MMT Right eval Left eval  Shoulder flexion WNL 3+  Shoulder extension WNL 5*  Shoulder abduction WNL 3+  Shoulder adduction WNL 5*  Shoulder extension WNL 5*  Shoulder internal rotation    Shoulder external rotation    Middle trapezius    Lower trapezius     (Blank rows = not tested) * not measured against gravity   CERVICAL SPECIAL TESTS:  Spurling's test: Negative and Distraction test: Positive  FUNCTIONAL TESTS:   NDI: 33  TODAY'S TREATMENT:                                                                                                                              DATE: 06/26/22  Seated scapular retractions 15 x 5 sec holds   Supine peanut suboccipital release x 2 minutes   UT stretch on R with GH depression x 45 seconds   Sidebending muscle activation isometrics 10 x 5 sec ea side.   Rotation muscle activation with isometrics x 10 ea side  Thoracic extensions on foam roller seated in armchair  10 x 5 seocnds, significant limitation of thoracic extension in thi sposition.   Sidelying thoracic rotation ( open book) 10 x 5 sec on ea side   Ballroll out for thoracic rotation 15 x 5 sec holds, pt reports feels very good on shoulders  Manual:   Supine subociital release x 45 seconds   Pec minor release on R x 30 seconds   Prone rhomboid (R > L) ishcemic trigger point release and IASTM x several minutes    PATIENT EDUCATION:  Education details: PHEP Person educated: Patient Education method: Explanation Education comprehension: verbalized understanding  HOME EXERCISE PROGRAM: To begin next session, UT and levator stretch, postural muscle activation activities   ASSESSMENT:  CLINICAL IMPRESSION: Pt presents for initial treatment session. Pt able to tolerate progressive exercises for the neck this date with good response and no increase in pain. Pt still having pain with looking down. Pt thoracic mobility still limiting. Pt will continue to benefit from skilled physical therapy intervention to address impairments, improve QOL, and attain therapy goals.     OBJECTIVE IMPAIRMENTS: decreased activity tolerance, decreased ROM, hypomobility, impaired perceived functional ability, impaired flexibility, and pain.   ACTIVITY LIMITATIONS: carrying, sitting, and standing  PARTICIPATION LIMITATIONS: meal prep, cleaning, laundry, driving, and community activity  PERSONAL FACTORS: 3+  comorbidities: DDD, HTN, COPD  are also affecting patient's functional outcome.   REHAB POTENTIAL: Fair chronicity of left sided neck pain (>5 years of significantly limiting pain) and ongoing uncertainty with cancer progression  CLINICAL DECISION MAKING: Evolving/moderate complexity  EVALUATION COMPLEXITY: Moderate   GOALS: Goals reviewed with patient? Yes  SHORT TERM GOALS: Target date: 07/17/2022    Patient will be independent with home exercise program in order to improve neck pain and range of  motion Baseline: No Home exercise program at this time Goal status: INITIAL   LONG TERM GOALS: Target date: 08/14/2022    Patient will improve cervical range of motion by 15 degrees with lateral flexion by 10 degrees with cervical rotation in order to improve patient's function Baseline: See evaluation table Goal status: INITIAL  2.  Patient will improve focus on therapeutic outcomes survey to 42 or greater to indicate improved subjective rating of neck function and pain. Baseline: 26 Goal status: INITIAL  3.  Patient will improve neck disability index score by 10 points or greater in order to indicate statistically significant improvement in neck related functional capacity. Baseline: 33 Goal status: INITIAL  4.  Patient will improve right shoulder flexion and abduction strength to 4 out of 5 or greater in order to indicate improved strength on the right side and improved lead to complete functional activities with the right upper extremity without pain or limitation Baseline: See evaluation chart Goal status: INITIAL     PLAN:  PT FREQUENCY: 2x/week  PT DURATION: 8 weeks  PLANNED INTERVENTIONS: Therapeutic exercises, Therapeutic activity, Neuromuscular re-education, Balance training, Gait training, Patient/Family education, Self Care, Joint mobilization, Joint manipulation, Spinal mobilization, and Manual therapy  PLAN FOR NEXT SESSION: progress  home exercise program  if appropriate, manual therapy for improvement in pain, continue POC for cervical and thoracic mobility and cervical stability    Particia Lather, PT 06/26/2022, 9:34 AM

## 2022-06-28 ENCOUNTER — Ambulatory Visit: Payer: Medicaid Other

## 2022-06-28 DIAGNOSIS — M542 Cervicalgia: Secondary | ICD-10-CM

## 2022-06-28 DIAGNOSIS — G8929 Other chronic pain: Secondary | ICD-10-CM | POA: Diagnosis not present

## 2022-06-28 DIAGNOSIS — Z7689 Persons encountering health services in other specified circumstances: Secondary | ICD-10-CM | POA: Diagnosis not present

## 2022-06-28 NOTE — Therapy (Signed)
OUTPATIENT PHYSICAL THERAPY CERVICAL TREATMENT    Patient Name: Clarence Skinner MRN: [id number] DOB:03-Sep-1960, 61 y.o., male Today's Date: 06/28/2022  END OF SESSION:  PT End of Session - 06/28/22 0934     Visit Number 4    Number of Visits 16    Date for PT Re-Evaluation 08/14/22    Authorization Type Medicaid    Authorization - Number of Visits 10    Progress Note Due on Visit 10    PT Start Time 0934    PT Stop Time 1015    PT Time Calculation (min) 41 min    Activity Tolerance Patient tolerated treatment well              Past Medical History:  Diagnosis Date   COPD (chronic obstructive pulmonary disease) ([place])    GERD (gastroesophageal reflux disease)    Gout    Hypertension    Multilevel degenerative disc disease    Pneumonia 11/29/2017   Sleep apnea    CPAP   Tachycardia    Wears dentures    partial upper and lower   Past Surgical History:  Procedure Laterality Date   ABCESS DRAINAGE  2009   tonsil   COLONOSCOPY WITH PROPOFOL N/A 02/11/2018   Procedure: COLONOSCOPY WITH PROPOFOL;  Surgeon: Toledo, Benay Pike, MD;  Location: [facility];  Service: Endoscopy;  Laterality: N/A;   ESOPHAGOGASTRODUODENOSCOPY (EGD) WITH PROPOFOL N/A 02/11/2018   Procedure: ESOPHAGOGASTRODUODENOSCOPY (EGD) WITH PROPOFOL;  Surgeon: Toledo, Benay Pike, MD;  Location: [facility];  Service: Endoscopy;  Laterality: N/A;   IR IMAGING GUIDED PORT INSERTION  04/09/2022   MASS EXCISION Right 08/14/2017   Procedure: EXCISION RIGHT TEMPORAL MASS SUBMENTAL MASS;  Surgeon: Carloyn Manner, MD;  Location: [place];  Service: ENT;  Laterality: Right;  sleep apnea   RADIOACTIVE SEED IMPLANT N/A 01/26/2019   Procedure: RADIOACTIVE SEED IMPLANT/BRACHYTHERAPY IMPLANT;  Surgeon: Billey Co, MD;  Location: [facility];  Service: Urology;  Laterality: N/A;   SEPTOPLASTY  2016   [place], [place]   VOLUME STUDY N/A 12/29/2018   Procedure: VOLUME STUDY;  Surgeon: Billey Co, MD;   Location: [facility];  Service: Urology;  Laterality: N/A;   Patient Active Problem List   Diagnosis Date Noted   Sinusitis, acute 05/30/2022   Mass of soft tissue 05/16/2022   Blurred vision 05/16/2022   Hypotension 05/09/2022   Thrush 05/09/2022   Mucositis 04/11/2022   Sinusitis, chronic 04/04/2022   Encounter for antineoplastic chemotherapy 03/28/2022   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[place]) 03/22/2022   Goals of care, counseling/discussion 03/22/2022   Carotid sinus syndrome 03/22/2022   Neck pain 03/22/2022   Mass of right side of neck    Anemia 02/27/2022   Tonsillar mass 02/27/2022   Overweight (BMI 25.0-29.9) 02/27/2022   Syncope 02/26/2022   Pain in joint of left elbow 01/10/2022   Chronic obstructive pulmonary disease ([place]) 06/17/2020   Prostate cancer ([place]) 03/31/2019   Mass of upper lobe of left lung 12/02/2017   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   Tobacco use disorder 11/06/2017   Cervical spondylosis without myelopathy 08/27/2017   Facet arthritis of cervical region 08/27/2017   Obstructive sleep apnea 03/13/2017   Pulmonary hypertension, unspecified ([place]) 01/02/2017   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   Essential hypertension 12/27/2016   Tachycardia 12/27/2016    PCP: Mechele Claude, FNP   REFERRING PROVIDER: [name] MD    REFERRING DIAG: C09.9 (  ICD-10-CM) - 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HCC)   THERAPY DIAG:  Cervicalgia  Neck pain on right side  Chronic neck pain  Rationale for Evaluation and Treatment: Rehabilitation  ONSET DATE: 02/2022  SUBJECTIVE:                                                                                                                                                                                                         SUBJECTIVE STATEMENT: Pt denies any pain upon arrival to the clinic.  Pt states he has not been doing HEP because he  forgets it.   PERTINENT HISTORY:  Pt reports neck pain since onset of cancer in August of 2023. Pt has pain primarily on the right side and in the posterior aspect of the neck. Pt previously had pain for at least last 5 years on the left side of his neck but the pain is now primarily on the right side of his neck. Pain is very limiting in his ability to complete daily activities without rest breaks. Pt has history of "degenerative changes in the neck" and cervical lymph node metastasis that impact his neck pain. Pt has history of headaches " all the time".    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 Recommend concurrent chemotherapy with radiation.  OT SCREEN 05/23/22: Patient arrive with reports of having trouble swallowing as well as posterior neck pain.  Neck feeling heavy at times during the day hard time keeping it up.  Patient receiving radiation mostly on the right side of the neck.  Reports increased tightness and pain with motion. Upon asking patient about referral to speech therapy.  Patient reports they called him but did not know they were addressing swallowing issues.  Request if they can contact him again because of swallowing issues. Message sent to SLP to call pt again. Patient report discomfort mostly on posterior and right side of neck. Patient show decreased cervical extension more than flexion  Rotation to the right and left 40 degrees. Lateral flexion to the right 30 into the left 20. Patient was educated in correct posture for sitting and sleeping.  Patient was propping neck up with 1 or 2 pillows pushing head and cervical flexion.  Educated on correct positioning when sitting up in bed watching TV or in recliner during the day. Patient is a side sleeper mostly. Educated patient on doing a small roll under her neck in supine doing gentle active  range  of motion in supine for cervical rotation, extension and lateral flexion. Reinforced pain-free gentle motion 6-8 reps 3 times a day. Patient wants to follow-up in 2 weeks - if continued to have issues we will request PT eval and treat order.       OT SCREEN 06/06/22: Patient follow-up today after being seen 2 weeks ago.  Referred to speech therapy.  Patient was evaluated and is followed by speech therapy.  Patient reports pain in his neck is better but still feel it on the posterior neck as well as  well as head gets tired at times. Pt is doing much better with how he is sitting and supine with his posture /positioning of neck. Patient do show increase cervical rotation and lateral flexion compared to 2 weeks ago. Rotation to the left 50, rotation to the right 55 degrees. Lateral flexion to the left 30 into the right 38. Did recommend for patient to maybe follow-up with physical therapy for evaluation and treat for cervical range of motion and decreasing pain.  Patient in agreement. Patient did finish radiation but is scheduled for (per patient )a x-ray tomorrow.  PAIN:  Are you having pain? Yes: NPRS scale: 0/10 Pain location:   Pain description:  Aggravating factors: Relieving factors:   PRECAUTIONS: Other: cancer, MD recommends no heat until follow up pt uncomfortable with heat applied to this area.   WEIGHT BEARING RESTRICTIONS: No  FALLS:  Has patient fallen in last 6 months? No  LIVING ENVIRONMENT: Lives with: lives alone Lives in: House/apartment Stairs: No Has following equipment at home: None  OCCUPATION: retired   PLOF: Independent and has pain limiting his ability to complete daily activities, used to like to go fishing but has not been able due to neck. Had pain on the left sid eprior to cancer that also got up to 10.   PATIENT GOALS: Improve pain levels.   NEXT MD VISIT:   OBJECTIVE:   DIAGNOSTIC FINDINGS:  Neck soft tissue only MRI cervical region on  02/27/22 IMPRESSION: Constellation of findings most compatible with a roughly 2.4 cm Right Tonsillar Carcinoma (series 12, image 11) with associated right level 2 and level 3 malignant nodal, ex-nodal disease (series 14, image 13). Recommend ENT consultation.  PATIENT SURVEYS:  FOTO 26 risk adjusted goal of 42   COGNITION: Overall cognitive status: Within functional limits for tasks assessed  SENSATION: WFL  POSTURE: rounded shoulders, forward head, and posterior pelvic tilt  PALPATION: TTP on bilateral suboccipital musculature, cervical paraspinals and R > L upper trapezius musculature.    CERVICAL ROM:   Active ROM A/PROM (deg) eval  Flexion 52*  Extension 31  Right lateral flexion 15  Left lateral flexion 11*  Right rotation 39  Left rotation 41  Thoracic rotation left  Limited*  Thoracic rotation right  WNL   (Blank rows = not tested) *pain   UPPER EXTREMITY ROM:  Active ROM Right eval Left eval  Shoulder flexion WNL WNL   Shoulder extension WNL WNL  Shoulder abduction WNL Limited   Shoulder adduction WNL WNL  Shoulder extension    Shoulder internal rotation    Shoulder external rotation    Elbow flexion    Elbow extension    Wrist flexion    Wrist extension    Wrist ulnar deviation    Wrist radial deviation    Wrist pronation    Wrist supination     (Blank rows = not tested)  UPPER EXTREMITY MMT:  MMT Right eval Left eval  Shoulder flexion WNL 3+  Shoulder extension WNL 5*  Shoulder abduction WNL 3+  Shoulder adduction WNL 5*  Shoulder extension WNL 5*  Shoulder internal rotation    Shoulder external rotation    Middle trapezius    Lower trapezius     (Blank rows = not tested) * not measured against gravity   CERVICAL SPECIAL TESTS:  Spurling's test: Negative and Distraction test: Positive  FUNCTIONAL TESTS:  NDI: 33  TODAY'S TREATMENT:                                                                                                                               DATE: 06/28/22  Seated scapular retractions with BTB applied to resistance, 2x15; pt refused to perform with any hold Supine peanut suboccipital release x 2 minutes  Seated thoracic rotation while seated in armchair, 2x10 each side Seated thoracic extensions over foam roller in armchair, 2x15 with 3 sec holds  Sidelying thoracic rotation (open book) 10 x 5 sec on ea side  Kneeling on airex pad, modified prayer stretch with elbow flexion/extension for increased thoracic mobility, 2x10 Seated ball roll-outs for improved spinal mobilization, forward/lateral to each side, x10 each direction with 3 sec holds   Manual:   Supine STM to cervical region to increase extensibility of the paraspinals Supine suboccipital release technique to decrease cervicalgia Supine manual traction performed in order to increase joint space in cervical region for pain relief Supine cervical upglides/downglides, 30 sec bouts Supine UT/Levator stretch, 30 sec bouts to increase tissue extensibility of the cervical region    PATIENT EDUCATION:  Education details: PHEP Person educated: Patient Education method: Explanation Education comprehension: verbalized understanding  HOME EXERCISE PROGRAM: To begin next session, UT and levator stretch, postural muscle activation activities   ASSESSMENT:  CLINICAL IMPRESSION:  Pt responded well to the exercises given to him today and put forth good effort.  Pt declined to perform holds on a couple of exercises, but otherwise completed the tasks assigned.  Pt also noted some relief upon leaving although he had no pain upon arrival to the clinic.  Pt advised to continue with current exercise program at home.   Pt will continue to benefit from skilled therapy to address remaining deficits in order to improve overall QoL and return to PLOF.       OBJECTIVE IMPAIRMENTS: decreased activity tolerance, decreased ROM, hypomobility, impaired perceived  functional ability, impaired flexibility, and pain.   ACTIVITY LIMITATIONS: carrying, sitting, and standing  PARTICIPATION LIMITATIONS: meal prep, cleaning, laundry, driving, and community activity  PERSONAL FACTORS: 3+ comorbidities: DDD, HTN, COPD  are also affecting patient's functional outcome.   REHAB POTENTIAL: Fair chronicity of left sided neck pain (>5 years of significantly limiting pain) and ongoing uncertainty with cancer progression  CLINICAL DECISION MAKING: Evolving/moderate complexity  EVALUATION COMPLEXITY: Moderate   GOALS: Goals reviewed with patient? Yes  SHORT TERM GOALS: Target date: 07/17/2022  Patient will be independent with home exercise program in order to improve neck pain and range of motion Baseline: No Home exercise program at this time Goal status: INITIAL   LONG TERM GOALS: Target date: 08/14/2022    Patient will improve cervical range of motion by 15 degrees with lateral flexion by 10 degrees with cervical rotation in order to improve patient's function Baseline: See evaluation table Goal status: INITIAL  2.  Patient will improve focus on therapeutic outcomes survey to 42 or greater to indicate improved subjective rating of neck function and pain. Baseline: 26 Goal status: INITIAL  3.  Patient will improve neck disability index score by 10 points or greater in order to indicate statistically significant improvement in neck related functional capacity. Baseline: 33 Goal status: INITIAL  4.  Patient will improve right shoulder flexion and abduction strength to 4 out of 5 or greater in order to indicate improved strength on the right side and improved lead to complete functional activities with the right upper extremity without pain or limitation Baseline: See evaluation chart Goal status: INITIAL     PLAN:  PT FREQUENCY: 2x/week  PT DURATION: 8 weeks  PLANNED INTERVENTIONS: Therapeutic exercises, Therapeutic activity,  Neuromuscular re-education, Balance training, Gait training, Patient/Family education, Self Care, Joint mobilization, Joint manipulation, Spinal mobilization, and Manual therapy  PLAN FOR NEXT SESSION:   progress home exercise program if appropriate, manual therapy for improvement in pain, continue POC for cervical and thoracic mobility and cervical stability    Gwenlyn Saran, PT, DPT Physical Therapist- [facility]  06/28/22, 10:56 AM

## 2022-06-29 DIAGNOSIS — C099 Malignant neoplasm of tonsil, unspecified: Secondary | ICD-10-CM | POA: Diagnosis not present

## 2022-07-03 ENCOUNTER — Ambulatory Visit: Payer: Medicaid Other | Admitting: Physical Therapy

## 2022-07-03 ENCOUNTER — Encounter: Payer: Self-pay | Admitting: Physical Therapy

## 2022-07-03 DIAGNOSIS — G8929 Other chronic pain: Secondary | ICD-10-CM | POA: Diagnosis not present

## 2022-07-03 DIAGNOSIS — M542 Cervicalgia: Secondary | ICD-10-CM | POA: Diagnosis not present

## 2022-07-03 NOTE — Therapy (Signed)
OUTPATIENT PHYSICAL THERAPY CERVICAL TREATMENT    Patient Name: Clarence Skinner MRN: [id number] DOB:02-Nov-1960, 61 y.o., male Today's Date: 07/03/2022  END OF SESSION:  PT End of Session - 07/03/22 0928     Visit Number 5    Number of Visits 16    Date for PT Re-Evaluation 08/14/22    Authorization Type Medicaid    Authorization - Visit Number 5    Authorization - Number of Visits 12    Progress Note Due on Visit 10    PT Start Time 0930    PT Stop Time 1012    PT Time Calculation (min) 42 min    Activity Tolerance Patient tolerated treatment well               Past Medical History:  Diagnosis Date   COPD (chronic obstructive pulmonary disease) (HCC)    GERD (gastroesophageal reflux disease)    Gout    Hypertension    Multilevel degenerative disc disease    Pneumonia 11/29/2017   Sleep apnea    CPAP   Tachycardia    Wears dentures    partial upper and lower   Past Surgical History:  Procedure Laterality Date   ABCESS DRAINAGE  2009   tonsil   COLONOSCOPY WITH PROPOFOL N/A 02/11/2018   Procedure: COLONOSCOPY WITH PROPOFOL;  Surgeon: Toledo, Benay Pike, MD;  Location: [facility];  Service: Endoscopy;  Laterality: N/A;   ESOPHAGOGASTRODUODENOSCOPY (EGD) WITH PROPOFOL N/A 02/11/2018   Procedure: ESOPHAGOGASTRODUODENOSCOPY (EGD) WITH PROPOFOL;  Surgeon: Toledo, Benay Pike, MD;  Location: [facility];  Service: Endoscopy;  Laterality: N/A;   IR IMAGING GUIDED PORT INSERTION  04/09/2022   MASS EXCISION Right 08/14/2017   Procedure: EXCISION RIGHT TEMPORAL MASS SUBMENTAL MASS;  Surgeon: Carloyn Manner, MD;  Location: [facility];  Service: ENT;  Laterality: Right;  sleep apnea   RADIOACTIVE SEED IMPLANT N/A 01/26/2019   Procedure: RADIOACTIVE SEED IMPLANT/BRACHYTHERAPY IMPLANT;  Surgeon: Billey Co, MD;  Location: [facility];  Service: Urology;  Laterality: N/A;   SEPTOPLASTY  2016   [place]   VOLUME STUDY N/A 12/29/2018   Procedure: VOLUME  STUDY;  Surgeon: Billey Co, MD;  Location: [facility];  Service: Urology;  Laterality: N/A;   Patient Active Problem List   Diagnosis Date Noted   Sinusitis, acute 05/30/2022   Mass of soft tissue 05/16/2022   Blurred vision 05/16/2022   Hypotension 05/09/2022   Thrush 05/09/2022   Mucositis 04/11/2022   Sinusitis, chronic 04/04/2022   Encounter for antineoplastic chemotherapy 03/28/2022   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   Goals of care, counseling/discussion 03/22/2022   Carotid sinus syndrome 03/22/2022   Neck pain 03/22/2022   Mass of right side of neck    Anemia 02/27/2022   Tonsillar mass 02/27/2022   Overweight (BMI 25.0-29.9) 02/27/2022   Syncope 02/26/2022   Pain in joint of left elbow 01/10/2022   Chronic obstructive pulmonary disease ([place]) 06/17/2020   Prostate cancer ([place]) 03/31/2019   Mass of upper lobe of left lung 12/02/2017   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   Tobacco use disorder 11/06/2017   Cervical spondylosis without myelopathy 08/27/2017   Facet arthritis of cervical region 08/27/2017   Obstructive sleep apnea 03/13/2017   Pulmonary hypertension, unspecified ([place]) 01/02/2017   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   Essential hypertension 12/27/2016   Tachycardia 12/27/2016    PCP: Mechele Claude, FNP   REFERRING PROVIDER:  [name] MD    REFERRING DIAG: C09.9 (ICD-10-CM) - 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 Pt  reports he is doing his HEP a little but, especially when it starts hurting. Pt reports he could be doing it more.   PERTINENT HISTORY:  Pt reports neck pain since onset of cancer in August of 2023. Pt has pain primarily on the right side and in the posterior aspect of the neck. Pt previously had pain for at least last 5 years on the left side of his neck but the pain is now primarily on the right side of his neck. Pain is very limiting in his ability to complete daily activities without rest breaks. Pt has history of "degenerative changes in the neck" and cervical lymph node metastasis that impact his neck pain. Pt has history of headaches " all the time".    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 Recommend concurrent chemotherapy with radiation.  OT SCREEN 05/23/22: Patient arrive with reports of having trouble swallowing as well as posterior neck pain.  Neck feeling heavy at times during the day hard time keeping it up.  Patient receiving radiation mostly on the right side of the neck.  Reports increased tightness and pain with motion. Upon asking patient about referral to speech therapy.  Patient reports they called him but did not know they were addressing swallowing issues.  Request if they can contact him again because of swallowing issues. Message sent to SLP to call pt again. Patient report discomfort mostly on posterior and right side of neck. Patient show decreased cervical extension more than flexion  Rotation to the right and left 40 degrees. Lateral flexion to the right 30 into the left 20. Patient was educated in correct posture for sitting and sleeping.  Patient was propping neck up with 1 or 2 pillows pushing head and cervical flexion.  Educated on correct positioning when sitting up in bed watching TV or in recliner during the day. Patient is  a side  sleeper mostly. Educated patient on doing a small roll under her neck in supine doing gentle active range of motion in supine for cervical rotation, extension and lateral flexion. Reinforced pain-free gentle motion 6-8 reps 3 times a day. Patient wants to follow-up in 2 weeks - if continued to have issues we will request PT eval and treat order.       OT SCREEN 06/06/22: Patient follow-up today after being seen 2 weeks ago.  Referred to speech therapy.  Patient was evaluated and is followed by speech therapy.  Patient reports pain in his neck is better but still feel it on the posterior neck as well as  well as head gets tired at times. Pt is doing much better with how he is sitting and supine with his posture /positioning of neck. Patient do show increase cervical rotation and lateral flexion compared to 2 weeks ago. Rotation to the left 50, rotation to the right 55 degrees. Lateral flexion to the left 30 into the right 38. Did recommend for patient to maybe follow-up with physical therapy for evaluation and treat for cervical range of motion and decreasing pain.  Patient in agreement. Patient did finish radiation but is scheduled for (per patient )a x-ray tomorrow.  PAIN:  Are you having pain? Yes: NPRS scale: 0/10 Pain location:   Pain description:  Aggravating factors: Relieving factors:   PRECAUTIONS: Other: cancer, MD recommends no heat until follow up pt uncomfortable with heat applied to this area.   WEIGHT BEARING RESTRICTIONS: No  FALLS:  Has patient fallen in last 6 months? No  LIVING ENVIRONMENT: Lives with: lives alone Lives in: House/apartment Stairs: No Has following equipment at home: None  OCCUPATION: retired   PLOF: Independent and has pain limiting his ability to complete daily activities, used to like to go fishing but has not been able due to neck. Had pain on the left sid eprior to cancer that also got up to 10.   PATIENT GOALS: Improve pain levels.    NEXT MD VISIT:   OBJECTIVE:   DIAGNOSTIC FINDINGS:  Neck soft tissue only MRI cervical region on 02/27/22 IMPRESSION: Constellation of findings most compatible with a roughly 2.4 cm Right Tonsillar Carcinoma (series 12, image 11) with associated right level 2 and level 3 malignant nodal, ex-nodal disease (series 14, image 13). Recommend ENT consultation.  PATIENT SURVEYS:  FOTO 26 risk adjusted goal of 42   COGNITION: Overall cognitive status: Within functional limits for tasks assessed  SENSATION: WFL  POSTURE: rounded shoulders, forward head, and posterior pelvic tilt  PALPATION: TTP on bilateral suboccipital musculature, cervical paraspinals and R > L upper trapezius musculature.    CERVICAL ROM:   Active ROM A/PROM (deg) eval  Flexion 52*  Extension 31  Right lateral flexion 15  Left lateral flexion 11*  Right rotation 39  Left rotation 41  Thoracic rotation left  Limited*  Thoracic rotation right  WNL   (Blank rows = not tested) *pain   UPPER EXTREMITY ROM:  Active ROM Right eval Left eval  Shoulder flexion WNL WNL   Shoulder extension WNL WNL  Shoulder abduction WNL Limited   Shoulder adduction WNL WNL  Shoulder extension    Shoulder internal rotation    Shoulder external rotation    Elbow flexion    Elbow extension    Wrist flexion    Wrist extension    Wrist ulnar deviation    Wrist radial deviation    Wrist pronation  Wrist supination     (Blank rows = not tested)  UPPER EXTREMITY MMT:  MMT Right eval Left eval  Shoulder flexion WNL 3+  Shoulder extension WNL 5*  Shoulder abduction WNL 3+  Shoulder adduction WNL 5*  Shoulder extension WNL 5*  Shoulder internal rotation    Shoulder external rotation    Middle trapezius    Lower trapezius     (Blank rows = not tested) * not measured against gravity   CERVICAL SPECIAL TESTS:  Spurling's test: Negative and Distraction test: Positive  FUNCTIONAL TESTS:  NDI: 33  TODAY'S  TREATMENT:                                                                                                                              DATE: 07/03/22  Seated scapular retractions with BTB applied to resistance, 2x15;   Supine peanut suboccipital release x 2 minutes   Seated thoracic extensions over foam roller in armchair, 2x15 with 3 sec holds   Side-lying thoracic rotation (open book) 10 x 5 sec on ea side   Seated ball roll-outs for improved spinal mobilization, forward/lateral to each side, x10 each direction with 3 sec holds  Supine chin tucks x 15 reps   Supine chin tuck combines with cervical flexion 1 x 10 reps   Manual:   Supine STM to cervical region to increase extensibility of the paraspinals Supine suboccipital release technique to decrease cervicalgia Supine manual traction performed in order to increase joint space in cervical region for pain relief Supine UT/Levator stretch, 45 sec bouts to increase tissue extensibility of the cervical region    PATIENT EDUCATION:  Education details: PHEP Person educated: Patient Education method: Explanation Education comprehension: verbalized understanding  HOME EXERCISE PROGRAM: To begin next session, UT and levator stretch, postural muscle activation activities   ASSESSMENT:  CLINICAL IMPRESSION:  Pt responded well to the exercises given to him today and put forth good effort. Pt progressed with cervical strengthening exercises with good response this date and with no increase in pain. Pt also noticing improvement in cervical mobility, although not measured this date.  Pt advised to continue with current exercise program at home but to complete exercises daily compared to just when he is having pain.   Pt will continue to benefit from skilled therapy to address remaining deficits in order to improve overall QoL and return to PLOF.       OBJECTIVE IMPAIRMENTS: decreased activity tolerance, decreased ROM, hypomobility,  impaired perceived functional ability, impaired flexibility, and pain.   ACTIVITY LIMITATIONS: carrying, sitting, and standing  PARTICIPATION LIMITATIONS: meal prep, cleaning, laundry, driving, and community activity  PERSONAL FACTORS: 3+ comorbidities: DDD, HTN, COPD  are also affecting patient's functional outcome.   REHAB POTENTIAL: Fair chronicity of left sided neck pain (>5 years of significantly limiting pain) and ongoing uncertainty with cancer progression  CLINICAL DECISION MAKING: Evolving/moderate complexity  EVALUATION COMPLEXITY: Moderate   GOALS: Goals reviewed  with patient? Yes  SHORT TERM GOALS: Target date: 07/17/2022    Patient will be independent with home exercise program in order to improve neck pain and range of motion Baseline: No Home exercise program at this time Goal status: INITIAL   LONG TERM GOALS: Target date: 08/14/2022    Patient will improve cervical range of motion by 15 degrees with lateral flexion by 10 degrees with cervical rotation in order to improve patient's function Baseline: See evaluation table Goal status: INITIAL  2.  Patient will improve focus on therapeutic outcomes survey to 42 or greater to indicate improved subjective rating of neck function and pain. Baseline: 26 Goal status: INITIAL  3.  Patient will improve neck disability index score by 10 points or greater in order to indicate statistically significant improvement in neck related functional capacity. Baseline: 33 Goal status: INITIAL  4.  Patient will improve right shoulder flexion and abduction strength to 4 out of 5 or greater in order to indicate improved strength on the right side and improved lead to complete functional activities with the right upper extremity without pain or limitation Baseline: See evaluation chart Goal status: INITIAL     PLAN:  PT FREQUENCY: 2x/week  PT DURATION: 8 weeks  PLANNED INTERVENTIONS: Therapeutic exercises, Therapeutic  activity, Neuromuscular re-education, Balance training, Gait training, Patient/Family education, Self Care, Joint mobilization, Joint manipulation, Spinal mobilization, and Manual therapy  PLAN FOR NEXT SESSION:   progress home exercise program if appropriate, manual therapy for improvement in pain, continue POC for cervical and thoracic mobility and cervical stability    Particia Lather PT   07/03/22, 11:48 AM

## 2022-07-04 ENCOUNTER — Ambulatory Visit: Payer: Medicaid Other | Admitting: Radiation Oncology

## 2022-07-04 NOTE — Progress Notes (Signed)
Modified Barium Swallow Progress Note  Patient Details  Name: Clarence Skinner MRN: [id number] Date of Birth: 1961-01-11  Today's Date: 06/07/2022  Modified Barium Swallow completed.  Full report located under Chart Review in the Imaging Section.  Brief recommendations include the following:  Clinical Impression  Pt with the appearance of a CP bar superior to the UES which impedes bolus transit into esophagus. This results in moderate pharyngeal residue post swallow prompting multiple swallows per bolus. Throughout the study, pt demonstrated good airway protection with no aspiration observed. Recommend pt continue current diet and follow up with ENT.   Swallow Evaluation Recommendations   Recommended Consults: Consider ENT evaluation   SLP Diet Recommendations: Regular solids;Thin liquid   Liquid Administration via: Straw;Cup   Medication Administration: Whole meds with liquid   Supervision: Patient able to self feed   Compensations: Minimize environmental distractions;Slow rate;Small sips/bites   Postural Changes: Seated upright at 90 degrees   Oral Care Recommendations: Oral care BID      Clarence Skinner B. Rutherford Nail, M.S., CCC-SLP, Mining engineer Certified Brain Injury [place]  [place] Office [phone] Ascom [phone] Fax [phone]

## 2022-07-04 NOTE — Addendum Note (Signed)
Encounter addended by: Stormy Fabian, CCC-SLP on: 07/04/2022 8:38 AM  Actions taken: Flowsheet accepted, Clinical Note Signed

## 2022-07-05 ENCOUNTER — Other Ambulatory Visit: Payer: Self-pay

## 2022-07-05 ENCOUNTER — Inpatient Hospital Stay: Payer: Medicaid Other

## 2022-07-05 ENCOUNTER — Other Ambulatory Visit: Payer: Self-pay | Admitting: *Deleted

## 2022-07-05 ENCOUNTER — Ambulatory Visit
Admission: RE | Admit: 2022-07-05 | Discharge: 2022-07-05 | Disposition: A | Discharge status: Home / Self Care | Payer: Medicaid Other | Source: Ambulatory Visit | Attending: Radiation Oncology | Admitting: Radiation Oncology

## 2022-07-05 ENCOUNTER — Ambulatory Visit: Payer: Medicaid Other | Admitting: Dietician

## 2022-07-05 ENCOUNTER — Inpatient Hospital Stay: Payer: Medicaid Other | Attending: Oncology | Admitting: Oncology

## 2022-07-05 ENCOUNTER — Encounter: Payer: Self-pay | Admitting: Oncology

## 2022-07-05 ENCOUNTER — Encounter: Payer: Self-pay | Admitting: Radiation Oncology

## 2022-07-05 VITALS — BP 113/72 | HR 95 | Temp 96.0°F | Wt 168.6 lb

## 2022-07-05 VITALS — BP 120/74 | HR 97 | Temp 96.5°F | Resp 14 | Ht 72.0 in | Wt 168.4 lb

## 2022-07-05 DIAGNOSIS — C099 Malignant neoplasm of tonsil, unspecified: Secondary | ICD-10-CM | POA: Insufficient documentation

## 2022-07-05 DIAGNOSIS — Z923 Personal history of irradiation: Secondary | ICD-10-CM | POA: Insufficient documentation

## 2022-07-05 DIAGNOSIS — Z79899 Other long term (current) drug therapy: Secondary | ICD-10-CM | POA: Diagnosis not present

## 2022-07-05 DIAGNOSIS — F17210 Nicotine dependence, cigarettes, uncomplicated: Secondary | ICD-10-CM | POA: Diagnosis not present

## 2022-07-05 DIAGNOSIS — I10 Essential (primary) hypertension: Secondary | ICD-10-CM | POA: Insufficient documentation

## 2022-07-05 DIAGNOSIS — R448 Other symptoms and signs involving general sensations and perceptions: Secondary | ICD-10-CM

## 2022-07-05 DIAGNOSIS — Z95828 Presence of other vascular implants and grafts: Secondary | ICD-10-CM

## 2022-07-05 DIAGNOSIS — C770 Secondary and unspecified malignant neoplasm of lymph nodes of head, face and neck: Secondary | ICD-10-CM | POA: Diagnosis not present

## 2022-07-05 DIAGNOSIS — Z8042 Family history of malignant neoplasm of prostate: Secondary | ICD-10-CM | POA: Insufficient documentation

## 2022-07-05 DIAGNOSIS — K1230 Oral mucositis (ulcerative), unspecified: Secondary | ICD-10-CM | POA: Insufficient documentation

## 2022-07-05 LAB — CBC WITH DIFFERENTIAL/PLATELET
Abs Immature Granulocytes: 0.09 10*3/uL — ABNORMAL HIGH (ref 0.00–0.07)
Basophils Absolute: 0.1 10*3/uL (ref 0.0–0.1)
Basophils Relative: 1 %
Eosinophils Absolute: 0 10*3/uL (ref 0.0–0.5)
Eosinophils Relative: 1 %
HCT: 34.2 % — ABNORMAL LOW (ref 39.0–52.0)
Hemoglobin: 11.2 g/dL — ABNORMAL LOW (ref 13.0–17.0)
Immature Granulocytes: 2 %
Lymphocytes Relative: 13 %
Lymphs Abs: 0.6 10*3/uL — ABNORMAL LOW (ref 0.7–4.0)
MCH: 26.7 pg (ref 26.0–34.0)
MCHC: 32.7 g/dL (ref 30.0–36.0)
MCV: 81.6 fL (ref 80.0–100.0)
Monocytes Absolute: 0.8 10*3/uL (ref 0.1–1.0)
Monocytes Relative: 16 %
Neutro Abs: 3.2 10*3/uL (ref 1.7–7.7)
Neutrophils Relative %: 67 %
Platelets: 413 10*3/uL — ABNORMAL HIGH (ref 150–400)
RBC: 4.19 MIL/uL — ABNORMAL LOW (ref 4.22–5.81)
RDW: 18.6 % — ABNORMAL HIGH (ref 11.5–15.5)
WBC: 4.9 10*3/uL (ref 4.0–10.5)
nRBC: 0 % (ref 0.0–0.2)

## 2022-07-05 LAB — COMPREHENSIVE METABOLIC PANEL
ALT: 11 U/L (ref 0–44)
AST: 14 U/L — ABNORMAL LOW (ref 15–41)
Albumin: 3.8 g/dL (ref 3.5–5.0)
Alkaline Phosphatase: 74 U/L (ref 38–126)
Anion gap: 9 (ref 5–15)
BUN: 12 mg/dL (ref 6–20)
CO2: 24 mmol/L (ref 22–32)
Calcium: 8.8 mg/dL — ABNORMAL LOW (ref 8.9–10.3)
Chloride: 104 mmol/L (ref 98–111)
Creatinine, Ser: 0.72 mg/dL (ref 0.61–1.24)
GFR, Estimated: >60 mL/min (ref >60)
Glucose, Bld: 112 mg/dL — ABNORMAL HIGH (ref 70–99)
Potassium: 3.6 mmol/L (ref 3.5–5.1)
Sodium: 137 mmol/L (ref 135–145)
Total Bilirubin: 0.4 mg/dL (ref 0.3–1.2)
Total Protein: 7.1 g/dL (ref 6.5–8.1)

## 2022-07-05 LAB — T4, FREE: Free T4: 0.79 ng/dL (ref 0.61–1.12)

## 2022-07-05 LAB — TSH: TSH: 0.978 u[IU]/mL (ref 0.350–4.500)

## 2022-07-05 MED ORDER — HEPARIN SOD (PORK) LOCK FLUSH 100 UNIT/ML IV SOLN
500.0000 [IU] | Freq: Once | INTRAVENOUS | Status: AC
  Administered 2022-07-05: 500 [IU] via INTRAVENOUS
  Filled 2022-07-05: qty 5

## 2022-07-05 MED ORDER — SODIUM CHLORIDE 0.9% FLUSH
10.0000 mL | INTRAVENOUS | Status: DC | PRN
  Administered 2022-07-05: 10 mL via INTRAVENOUS
  Filled 2022-07-05: qty 10

## 2022-07-05 MED ORDER — STERILE WATER FOR INJECTION IJ SOLN
OROMUCOSAL | 1 refills | Status: DC
  Filled 2022-07-05: qty 480, 12d supply, fill #0

## 2022-07-05 MED ORDER — MAGIC MOUTHWASH W/LIDOCAINE: 5.0000 mL | Freq: Four times a day (QID) | ORAL | 1 refills | Status: DC | PRN

## 2022-07-05 NOTE — Progress Notes (Signed)
Nutrition Follow-up:   Called patient on mobile to follow up on PO intake and NIS.  Patient reports appetite is good.  He is able to eat anything he want. Tolerating all foods and textures.  PO intake yesterday included; fat back, eggs, pork chops and potatoes. Eating lots of broccoli and chicken.  Declined need for nutrition resources.   Medications: reviewed    Labs: reviewed 07/05/22   Anthropometrics: weight rebounded   Weight:   07/05/22 168.6# 05/29/22  157 (radiation UT/) 05/09/22  164.8# 04/04/22 170 lb UBW:  176 lb  DBW:  180# BMI: 22.87   NUTRITION DIAGNOSIS: Inadequate oral intake-- resolved      INTERVENTION:  Encourage nutrient dense food choices  Goal weight maintenance.     MONITORING, EVALUATION, GOAL: weight trends, intake     NEXT VISIT: PRN at patient or provider request    [name], RDN, LDN Registered Dietitian, [place] Part Time Remote (Usual office hours: Tuesday-Thursday) Mobile: [phone]

## 2022-07-05 NOTE — Progress Notes (Signed)
Patient feeling well today. Declines any fluids.

## 2022-07-05 NOTE — Assessment & Plan Note (Signed)
History head and neck radiation.  Check TSH and T4

## 2022-07-05 NOTE — Progress Notes (Signed)
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 Recommend concurrent chemotherapy with radiation. Labs are reviewed and discussed with patient. He finishes concurrent chemo and radiation Repeat PET scan assessment of treatment response.  Mucositis Recommend Magic mouth wash PRN. Refills Rx provided to patient Encourage oral hydration.   Feels cold History head and neck radiation.  Check TSH and T4   Orders Placed This Encounter  Procedures   T4, free    Standing Status:   Future    Number of Occurrences:   1    Standing Expiration Date:   07/06/2023   TSH    Standing Status:   Future    Number of Occurrences:   1    Standing Expiration Date:   07/06/2023   CBC with Differential/Platelet    Standing Status:   Future    Standing Expiration Date:   07/06/2023   Comprehensive metabolic panel    Standing Status:   Future    Standing Expiration Date:   07/05/2023     Follow up  PET scan in Januray 2024 and follow up after PET  All questions were answered. The patient knows to call the clinic with any problems, questions or concerns. No barriers to learning was detected.  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 male presents to establish care for  I have reviewed his chart and materials related to his cancer extensively and collaborated history with the patient. 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 This likely reflects an enlarged lymph node.  2 Abnormal soft tissue surrounding the mid-to-distal cervical right ICA with resultant mild vessel narrowing 3 Atherosclerotic plaque within the bilateral common carotid and cervical internal carotid arteries without hemodynamically significant stenosis. 4 Vertebral arteries patent within the neck. Mild-to-moderate atherosclerotic narrowing at the origin of the right vertebral artery. No more than mild atherosclerotic stenosis at the origin of the left vertebral artery. 5 Aortic Atherosclerosis and Emphysema 6. Cervical spondylosis   02/27/2022 Imaging   MRI neck soft tissue w wo Constellation of findings most compatible with a roughly 2.4 cm Right Tonsillar Carcinoma (series 12, image 11) with associated right level 2 and level 3 malignant nodal, ex-nodal disease (series 14, image 13). Recommend ENT consultation    02/27/2022 Imaging   02/27/2022 MRI brain without contrast showed  No acute intracranial abnormality. Abnormal right neck soft tissues. See dedicated Neck MRI today reported separately. 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 These are slow flow vascular malformations which are generally asymptomatic. [name] Other moderately advanced but nonspecific cerebral white matter signal changes.   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 Metastatic squamous cell carcinoma. Moderately differentiated with focal keratinization.  p16 negative.    02/28/2022 Cancer Staging   Staging form: Pharynx - P16 Negative Oropharynx, AJCC 8th Edition - Clinical stage from 02/28/2022: Stage IVA (cT2, cN2b, cM0, p16-) - Signed by [name], MD on 03/22/2022 Stage prefix: Initial diagnosis   03/19/2022 Imaging   PET scan showed Signs of RIGHT neck malignancy with metastatic involvement just below the skull base and at level II/III also on the RIGHT. No signs of contralateral nodal involvement.   03/28/2022 -  Chemotherapy   Patient is on Treatment Plan : HEAD/NECK Carboplatin (AUC 2) q7d      Patient initially presented emergency room due to intermittent syncope/near syncope episodes, diaphoretic, nauseated. Imaging findings are concerning for right tonsil mass with level 2/level 3 lymph node involvement which contributes to syncope/near syncope episodes -carotid sinus syndrome. 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  COVID-19 infection.  Chemotherapy radiation was held for 2 weeks.    INTERVAL HISTORY Clarence Skinner is a 61 y.o. male who has above history reviewed by me today presents for follow up visit for management of stage IVa right tonsil squamous cell carcinoma No fever, chills, nausea vomiting.  He has gained weight. Still has mouth soreness and pain with swallowing. He sees physical therapy for post radiation neck stiffness.  No fever or chills.  No cough sore throat.   MEDICAL HISTORY:  Past Medical History:  Diagnosis Date   COPD (chronic obstructive pulmonary disease) (HCC)    GERD (gastroesophageal reflux disease)    Gout    Hypertension    Multilevel degenerative disc disease    Pneumonia 11/29/2017   Sleep apnea    CPAP   Tachycardia    Wears dentures    partial upper and lower    SURGICAL HISTORY: Past Surgical History:  Procedure Laterality Date   ABCESS DRAINAGE   2009   tonsil   COLONOSCOPY WITH PROPOFOL N/A 02/11/2018   Procedure: COLONOSCOPY WITH PROPOFOL;  Surgeon: Toledo, Benay Pike, MD;  Location: [facility];  Service: Endoscopy;  Laterality: N/A;   ESOPHAGOGASTRODUODENOSCOPY (EGD) WITH PROPOFOL N/A 02/11/2018   Procedure: ESOPHAGOGASTRODUODENOSCOPY (EGD) WITH PROPOFOL;  Surgeon: Toledo, Benay Pike, MD;  Location: [facility];  Service: Endoscopy;  Laterality: N/A;   IR IMAGING GUIDED PORT INSERTION  04/09/2022   MASS EXCISION Right 08/14/2017   Procedure: EXCISION RIGHT TEMPORAL MASS SUBMENTAL MASS;  Surgeon: Carloyn Manner, MD;  Location: [place];  Service: ENT;  Laterality: Right;  sleep apnea   RADIOACTIVE SEED IMPLANT N/A 01/26/2019   Procedure: RADIOACTIVE SEED IMPLANT/BRACHYTHERAPY IMPLANT;  Surgeon: Billey Co, MD;  Location: [facility];  Service: Urology;  Laterality: N/A;   SEPTOPLASTY  2016   [place], [place]   VOLUME STUDY N/A 12/29/2018   Procedure: VOLUME STUDY;  Surgeon: Billey Co, MD;  Location: [facility];  Service: Urology;  Laterality: N/A;    SOCIAL HISTORY: Social History   Socioeconomic History   Marital status: Significant Other    Spouse name: Not on file   Number of children: Not on file   Years of education: Not on file   Highest education level: Not on file  Occupational History  Not on file  Tobacco Use   Smoking status: Every Day    Packs/day: 1.00    Years: 30.00    Total pack years: 30.00    Types: Cigarettes   Smokeless tobacco: Former    Types: Snuff, Chew    Quit date: 12/05/1984   Tobacco comments:    since age 88  Vaping Use   Vaping Use: Never used  Substance and Sexual Activity   Alcohol use: Yes    Alcohol/week: 12.0 standard drinks of alcohol    Types: 12 Cans of beer per week    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   Drug use: Not Currently   Sexual activity: Yes  Other Topics Concern   Not on file  Social History Narrative   Not on file    Social Determinants of Health   Financial Resource Strain: High Risk (04/03/2022)   Overall Financial Resource Strain (CARDIA)    Difficulty of Paying Living Expenses: Hard  Food Insecurity: Food Insecurity Present (04/03/2022)   Hunger Vital Sign    Worried About Running Out of Food in the Last Year: Often true    Ran Out of Food in the Last Year: Often true  Transportation Needs: Unmet Transportation Needs (04/03/2022)   PRAPARE - Hydrologist (Medical): Yes    Lack of Transportation (Non-Medical): Yes  Physical Activity: Inactive (04/03/2022)   Exercise Vital Sign    Days of Exercise per Week: 0 days    Minutes of Exercise per Session: 0 min  Stress: Stress Concern Present (04/03/2022)   [place]    Feeling of Stress : Very much  Social Connections: Moderately Isolated (04/03/2022)   Social Connection and Isolation Panel [NHANES]    Frequency of Communication with Friends and Family: Never    Frequency of Social Gatherings with Friends and Family: Three times a week    Attends Religious Services: Never    Active Member of Clubs or Organizations: No    Attends Archivist Meetings: Not on file    Marital Status: Living with partner  Intimate Partner Violence: Not At Risk (04/03/2022)   Humiliation, Afraid, Rape, and Kick questionnaire    Fear of Current or Ex-Partner: No    Emotionally Abused: No    Physically Abused: No    Sexually Abused: No    FAMILY HISTORY: Family History  Problem Relation Age of Onset   Anxiety disorder Mother    Cancer Father    Hypertension Sister    Diabetes Sister    Asthma Sister    Other Brother        mva  at age 46   Gout Brother    Hypertension Brother    Prostate cancer Maternal Uncle    Prostate cancer Maternal Uncle     ALLERGIES:  is allergic to chocolate, eggs or egg-derived products, milk (cow), benadryl [diphenhydramine],  and milk-related compounds.  MEDICATIONS:  Current Outpatient Medications  Medication Sig Dispense Refill   albuterol (PROVENTIL HFA;VENTOLIN HFA) 108 (90 Base) MCG/ACT inhaler Inhale into the lungs every 6 (six) hours as needed for wheezing or shortness of breath.     allopurinol (ZYLOPRIM) 300 MG tablet Take 300 mg by mouth daily.     amLODipine-benazepril (LOTREL) 10-40 MG capsule Take 1 capsule by mouth daily.     Azelastine HCl 137 MCG/SPRAY SOLN PLACE 1 SPRAY INTO THE NOSE 2 (TWO) TIMES  DAILY AS NEEDED. 30 mL 1   bacitracin-polymyxin b (POLYSPORIN) ophthalmic ointment Place 1 Application into both eyes every 12 (twelve) hours. apply to eye every 12 hours while awake 3.5 g 0   baclofen (LIORESAL) 10 MG tablet Take 10 mg by mouth 2 (two) times daily as needed.     benzonatate (TESSALON) 100 MG capsule Take 1 capsule (100 mg total) by mouth every 8 (eight) hours as needed. 30 capsule 0   fluticasone (FLONASE) 50 MCG/ACT nasal spray Place into both nostrils daily as needed for allergies or rhinitis.     Fluticasone-Umeclidin-Vilant (TRELEGY ELLIPTA) 100-62.5-25 MCG/ACT AEPB Trelegy Ellipta 100-62.5-25 MCG/INH Inhalation Aerosol Powder Breath Activated QTY: 90 each Days: 90 Refills: 1  Written: 11/02/20 Patient Instructions: One puff once a day     furosemide (LASIX) 20 MG tablet Take 20 mg by mouth daily.     lidocaine-prilocaine (EMLA) cream Apply 1 Application topically as directed. Apply small amount of cream to port a cath site 1-2 hours prior to port being accessed. 30 g 2   loratadine (CLARITIN) 10 MG tablet Take 10 mg by mouth daily.     Oxycodone HCl 10 MG TABS Take 10 mg by mouth every 6 (six) hours as needed.     pantoprazole (PROTONIX) 40 MG tablet Take 40 mg by mouth daily.     prednisoLONE (ORAPRED) 15 MG/5ML solution Take by mouth.     senna (SENOKOT) 8.6 MG TABS tablet Take 1 tablet (8.6 mg total) by mouth daily. 120 tablet 0   Vitamin D, Ergocalciferol, (DRISDOL) 1.25 MG (50000  UNIT) CAPS capsule Take 50,000 Units by mouth once a week.     dexamethasone (DECADRON) 4 MG tablet Take 0.5 tablets (2 mg total) by mouth 2 (two) times daily with a meal. (Patient not taking: Reported on 07/05/2022) 30 tablet 0   docusate sodium (COLACE) 100 MG capsule Take 100 mg by mouth daily as needed. (Patient not taking: Reported on 07/05/2022)     ferrous sulfate 325 (65 FE) MG tablet Take 325 mg by mouth daily. (Patient not taking: Reported on 05/30/2022)     gabapentin (NEURONTIN) 100 MG capsule Take 1 capsule (100 mg total) by mouth at bedtime. (Patient not taking: Reported on 07/05/2022) 30 capsule 1   ibuprofen (ADVIL) 800 MG tablet SMARTSIG:1 Tablet(s) By Mouth Every 12 Hours PRN (Patient not taking: Reported on 05/30/2022)     magic mouthwash (multi-ingredient) oral suspension Swish and spit 5-10 mls four times a day as needed 480 mL 1   magic mouthwash w/lidocaine SOLN Take 5 mLs by mouth 4 (four) times daily as needed for mouth pain. Sig: Swish/Spit 5-10 ml four times a day as needed. Dispense 480 ml. 1RF 480 mL 1   meloxicam (MOBIC) 7.5 MG tablet Take 7.5 mg by mouth daily as needed for pain. (Patient not taking: Reported on 05/23/2022)     Morphine Sulfate (MORPHINE CONCENTRATE) 10 mg / 0.5 ml concentrated solution Take 0.5 mLs (10 mg total) by mouth every 6 (six) hours as needed for severe pain or moderate pain. (Patient not taking: Reported on 07/05/2022) 30 mL 0   nystatin (MYCOSTATIN) 100000 UNIT/ML suspension Take 5 mLs (500,000 Units total) by mouth 4 (four) times daily. Swish and swallow (Patient not taking: Reported on 07/05/2022) 473 mL 0   potassium chloride SA (KLOR-CON M) 20 MEQ tablet Take 1 tablet (20 mEq total) by mouth daily. (Patient not taking: Reported on 05/09/2022) 3 tablet 0   prochlorperazine (  COMPAZINE) 10 MG tablet Take 1 tablet (10 mg total) by mouth every 6 (six) hours as needed for nausea or vomiting. (Patient not taking: Reported on 07/05/2022) 30 tablet 1    rosuvastatin (CRESTOR) 20 MG tablet Take 1 tablet (20 mg total) by mouth daily. (Patient not taking: Reported on 07/05/2022) 30 tablet 5   sucralfate (CARAFATE) 1 GM/10ML suspension Take 10 mLs (1 g total) by mouth 4 (four) times daily -  with meals and at bedtime. (Patient not taking: Reported on 07/05/2022) 420 mL 0   SYMBICORT 80-4.5 MCG/ACT inhaler Inhale 1 puff into the lungs 2 (two) times daily. (Patient not taking: Reported on 07/05/2022)     tadalafil (CIALIS) 20 MG tablet Take 1 tablet (20 mg total) by mouth daily. (Patient not taking: Reported on 05/30/2022) 30 tablet 11   No current facility-administered medications for this visit.   Facility-Administered Medications Ordered in Other Visits  Medication Dose Route Frequency Provider Last Rate Last Admin   sodium chloride flush (NS) 0.9 % injection 10 mL  10 mL Intracatheter PRN [name], MD   10 mL at 04/11/22 1221    Review of Systems  Constitutional:  Negative for chills, fatigue, fever and unexpected weight change.  HENT:   Positive for hearing loss. Negative for voice change.        Mouth soreness.   Eyes:  Negative for eye problems and icterus.  Respiratory:  Negative for chest tightness, cough and shortness of breath.   Cardiovascular:  Negative for chest pain and leg swelling.  Gastrointestinal:  Negative for abdominal distention and abdominal pain.  Endocrine: Negative for hot flashes.  Genitourinary:  Negative for difficulty urinating, dysuria and frequency.   Musculoskeletal:  Positive for arthralgias.  Skin:  Negative for itching and rash.  Neurological:  Negative for light-headedness and numbness.  Hematological:  Negative for adenopathy. Does not bruise/bleed easily.  Psychiatric/Behavioral:  Negative for confusion.      PHYSICAL EXAMINATION: ECOG PERFORMANCE STATUS: 1 - Symptomatic but completely ambulatory  Vitals:   07/05/22 1018  BP: 113/72  Pulse: 95  Temp: (!) 96 F (35.6 C)  SpO2: 100%   Filed  Weights   07/05/22 1018  Weight: 168 lb 9.6 oz (76.5 kg)    Physical Exam Constitutional:      General: He is not in acute distress.    Appearance: He is not diaphoretic.  HENT:     Head: Normocephalic and atraumatic.     Nose: Nose normal.     Mouth/Throat:     Pharynx: No oropharyngeal exudate.  Eyes:     General: No scleral icterus.    Pupils: Pupils are equal, round, and reactive to light.  Cardiovascular:     Rate and Rhythm: Normal rate and regular rhythm.     Heart sounds: No murmur heard. Pulmonary:     Effort: Pulmonary effort is normal. No respiratory distress.     Breath sounds: No rales.  Chest:     Chest wall: No tenderness.  Abdominal:     General: There is no distension.     Palpations: Abdomen is soft.     Tenderness: There is no abdominal tenderness.  Musculoskeletal:        General: Normal range of motion.     Cervical back: Normal range of motion and neck supple.  Skin:    General: Skin is warm and dry.     Findings: No erythema.  Neurological:     Mental Status:  He is alert and oriented to person, place, and time.     Cranial Nerves: No cranial nerve deficit.     Motor: No abnormal muscle tone.     Coordination: Coordination normal.  Psychiatric:        Mood and Affect: Mood and affect normal.      LABORATORY DATA:  I have reviewed the data as listed    Latest Ref Rng & Units 07/05/2022   10:09 AM 06/13/2022    8:59 AM 05/30/2022    9:52 AM  CBC  WBC 4.0 - 10.5 K/uL 4.9  3.4  7.7   Hemoglobin 13.0 - 17.0 g/dL 11.2  10.8  10.5   Hematocrit 39.0 - 52.0 % 34.2  33.0  31.2   Platelets 150 - 400 K/uL 413  167  259       Latest Ref Rng & Units 07/05/2022   10:09 AM 06/13/2022    8:59 AM 05/30/2022    9:52 AM  CMP  Glucose 70 - 99 mg/dL 112  131  88   BUN 6 - 20 mg/dL '12  12  19   '$ Creatinine 0.61 - 1.24 mg/dL 0.72  0.77  0.86   Sodium 135 - 145 mmol/L 137  133  132   Potassium 3.5 - 5.1 mmol/L 3.6  4.0  3.9   Chloride 98 - 111 mmol/L 104   103  101   CO2 22 - 32 mmol/L '24  23  24   '$ Calcium 8.9 - 10.3 mg/dL 8.8  8.8  8.9   Total Protein 6.5 - 8.1 g/dL 7.1  7.3  7.4   Total Bilirubin 0.3 - 1.2 mg/dL 0.4  0.4  0.4   Alkaline Phos 38 - 126 U/L 74  89  61   AST 15 - 41 U/L '14  31  12   '$ ALT 0 - 44 U/L 11  55  19       RADIOGRAPHIC STUDIES: I have personally reviewed the radiological images as listed and agreed with the findings in the report. Leanne Chang SPEECH PATH  Result Date: 07/05/2022 Table formatting from the original result was not included. Images from the original result were not included. Objective Swallowing Evaluation: Type of Study: MBS-Modified Barium Swallow Study  Patient Details Name: Zollie Ellery MRN: [id number] Date of Birth: [birth year] Today's Date: 07/05/2022 Time: SLP Start Time (ACUTE ONLY): 0900 -SLP Stop Time (ACUTE ONLY): 1000 SLP Time Calculation (min) (ACUTE ONLY): 60 min Past Medical History: Past Medical History: Diagnosis Date  COPD (chronic obstructive pulmonary disease) (HCC)   GERD (gastroesophageal reflux disease)   Gout   Hypertension   Multilevel degenerative disc disease   Pneumonia 11/29/2017  Sleep apnea   CPAP  Tachycardia   Wears dentures   partial upper and lower Past Surgical History: Past Surgical History: Procedure Laterality Date  ABCESS DRAINAGE  2009  tonsil  COLONOSCOPY WITH PROPOFOL N/A 02/11/2018  Procedure: COLONOSCOPY WITH PROPOFOL;  Surgeon: Toledo, Benay Pike, MD;  Location: [facility];  Service: Endoscopy;  Laterality: N/A;  ESOPHAGOGASTRODUODENOSCOPY (EGD) WITH PROPOFOL N/A 02/11/2018  Procedure: ESOPHAGOGASTRODUODENOSCOPY (EGD) WITH PROPOFOL;  Surgeon: Toledo, Benay Pike, MD;  Location: [facility];  Service: Endoscopy;  Laterality: N/A;  IR IMAGING GUIDED PORT INSERTION  04/09/2022  MASS EXCISION Right 08/14/2017  Procedure: EXCISION RIGHT TEMPORAL MASS SUBMENTAL MASS;  Surgeon: Carloyn Manner, MD;  Location: [place];  Service: ENT;  Laterality: Right;  sleep  apnea  RADIOACTIVE SEED IMPLANT N/A  01/26/2019  Procedure: RADIOACTIVE SEED IMPLANT/BRACHYTHERAPY IMPLANT;  Surgeon: Billey Co, MD;  Location: [facility];  Service: Urology;  Laterality: N/A;  SEPTOPLASTY  2016  [place], [place]  VOLUME STUDY N/A 12/29/2018  Procedure: VOLUME STUDY;  Surgeon: Billey Co, MD;  Location: [facility];  Service: Urology;  Laterality: N/A; HPI: As per Dr. Jimmye Norman 8/9-8/10/23: Pt was found to have a tonsillar mass and had US guided lymph node biopsy by IR on 02/28/22. 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 Pt is current smoker of 1ppd since pt was in his 40s as per pt. Onco and radiation oncology evaluated the pt while inpatient.  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 Completed 70 [address] over 7 weeks with concurrent chemo.  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  Recommendations may be updated based on patient status, additional functional criteria and insurance authorization. Assessment / Plan / Recommendation   07/04/2022   8:00 AM Clinical Impressions Clinical Impression Pt with the appearance of a CP bar superior to the UES which impedes bolus transit into esophagus. This results in moderate pharyngeal residue post swallow prompting multiple swallows per bolus. Throughout the study, pt demonstrated good airway protection with no aspiration observed. Recommend pt continue current diet and follow up with ENT. SLP Visit Diagnosis Dysphagia, pharyngoesophageal phase (R13.14) Impact on safety and function Mild aspiration risk     07/04/2022   8:00 AM Treatment Recommendations Treatment Recommendations Therapy as outlined in treatment plan below      No data to display      07/04/2022   8:00 AM Diet Recommendations SLP Diet Recommendations Regular solids;Thin  liquid Liquid Administration via Straw;Cup Medication Administration Whole meds with liquid Compensations Minimize environmental distractions;Slow rate;Small sips/bites Postural Changes Seated upright at 90 degrees     07/04/2022   8:00 AM Other Recommendations Recommended Consults Consider ENT evaluation Oral Care Recommendations Oral care BID Follow Up Recommendations Outpatient SLP    No data to display        07/04/2022   8:00 AM Oral Phase Oral Phase [facility]    07/04/2022   8:00 AM Pharyngeal Phase Pharyngeal Phase Impaired    07/04/2022   8:00 AM Cervical Esophageal Phase  Cervical Esophageal Phase Impaired Happi Overton 07/05/2022, 8:22 AM

## 2022-07-05 NOTE — Assessment & Plan Note (Addendum)
Recommend Magic mouth wash PRN. Refills Rx provided to patient Encourage oral hydration.

## 2022-07-05 NOTE — Progress Notes (Signed)
Radiation Oncology Follow up Note  Name: Clarence Skinner   Date:   07/05/2022 MRN:  [id number] DOB: Feb 16, 1961    This 61 y.o. male presents to the clinic today for 1 month follow-up status post.  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  Back in 2020 for Gleason 7 adenocarcinoma.  He is now 1 month out from concurrent chemoradiation therapy for stage IVa squamous cell carcinoma the right tonsil.  Seen today in follow-up he is still having some slight dysphagia although appears to be improving.  He also has physical therapy for some neck stiffness.  He otherwise is doing well he is putting on weight.  COMPLICATIONS OF TREATMENT: none  FOLLOW UP COMPLIANCE: keeps appointments   PHYSICAL EXAM:  BP 120/74   Pulse 97   Temp (!) 96.5 F (35.8 C)   Resp 14   Ht 6' (1.829 m)   Wt 168 lb 6.4 oz (76.4 kg)   BMI 22.84 kg/m  On oral exam he still has some slight fullness in the right tonsillar region although no overt evidence of persistent disease.  Neck is clear without evidence of cervical or supraclavicular adenopathy.  Well-developed well-nourished patient in NAD. HEENT reveals PERLA, EOMI, discs not visualized.  Oral cavity is clear. No oral mucosal lesions are identified. Neck is clear without evidence of cervical or supraclavicular adenopathy. Lungs are clear to A&P. Cardiac examination is essentially unremarkable with regular rate and rhythm without murmur rub or thrill. Abdomen is benign with no organomegaly or masses noted. Motor sensory and DTR levels are equal and symmetric in the upper and lower extremities. Cranial nerves II through XII are grossly intact. Proprioception is intact. No peripheral adenopathy or edema is identified. No motor or sensory levels  are noted. Crude visual fields are within normal range.  RADIOLOGY RESULTS: CT scan ordered in 3 months  PLAN: Present time of asked the patient to return to ENT for evaluation and continued monitoring which he is trying to get an appointment for.  I have asked to see him back in 3 months with a CT scan of the head and neck prior to his visit.  He continues to improve patient knows to call with any concerns.  He continues follow-up care with medical oncology.  I would like to take this opportunity to thank you for allowing me to participate in the care of your patient.Noreene Filbert, MD

## 2022-07-05 NOTE — Assessment & Plan Note (Signed)
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 Recommend concurrent chemotherapy with radiation. Labs are reviewed and discussed with patient. He finishes concurrent chemo and radiation Repeat PET scan assessment of treatment response.

## 2022-07-10 ENCOUNTER — Encounter: Payer: Self-pay | Admitting: Physical Therapy

## 2022-07-10 ENCOUNTER — Inpatient Hospital Stay (HOSPITAL_BASED_OUTPATIENT_CLINIC_OR_DEPARTMENT_OTHER): Payer: Medicaid Other | Admitting: Hospice and Palliative Medicine

## 2022-07-10 ENCOUNTER — Ambulatory Visit: Payer: Medicaid Other | Admitting: Physical Therapy

## 2022-07-10 DIAGNOSIS — G8929 Other chronic pain: Secondary | ICD-10-CM | POA: Diagnosis not present

## 2022-07-10 DIAGNOSIS — Z515 Encounter for palliative care: Secondary | ICD-10-CM

## 2022-07-10 DIAGNOSIS — C099 Malignant neoplasm of tonsil, unspecified: Secondary | ICD-10-CM | POA: Diagnosis not present

## 2022-07-10 DIAGNOSIS — Z7689 Persons encountering health services in other specified circumstances: Secondary | ICD-10-CM | POA: Diagnosis not present

## 2022-07-10 DIAGNOSIS — M542 Cervicalgia: Secondary | ICD-10-CM | POA: Diagnosis not present

## 2022-07-10 NOTE — Therapy (Signed)
OUTPATIENT PHYSICAL THERAPY CERVICAL TREATMENT    Patient Name: Clarence Skinner MRN: [id number] DOB:05/14/1961, 61 y.o., male Today's Date: 07/10/2022  END OF SESSION:  PT End of Session - 07/10/22 0850     Visit Number 6    Number of Visits 16    Date for PT Re-Evaluation 08/14/22    Authorization Type Medicaid    Authorization - Visit Number 6    Authorization - Number of Visits 12    Progress Note Due on Visit 10    PT Start Time 0848    PT Stop Time 0929    PT Time Calculation (min) 41 min    Activity Tolerance Patient tolerated treatment well    Behavior During Therapy [facility] for tasks assessed/performed               Past Medical History:  Diagnosis Date   COPD (chronic obstructive pulmonary disease) (HCC)    GERD (gastroesophageal reflux disease)    Gout    Hypertension    Multilevel degenerative disc disease    Pneumonia 11/29/2017   Sleep apnea    CPAP   Tachycardia    Wears dentures    partial upper and lower   Past Surgical History:  Procedure Laterality Date   ABCESS DRAINAGE  2009   tonsil   COLONOSCOPY WITH PROPOFOL N/A 02/11/2018   Procedure: COLONOSCOPY WITH PROPOFOL;  Surgeon: Toledo, Benay Pike, MD;  Location: [facility];  Service: Endoscopy;  Laterality: N/A;   ESOPHAGOGASTRODUODENOSCOPY (EGD) WITH PROPOFOL N/A 02/11/2018   Procedure: ESOPHAGOGASTRODUODENOSCOPY (EGD) WITH PROPOFOL;  Surgeon: Toledo, Benay Pike, MD;  Location: [facility] ENDOSCOPY;  Service: Endoscopy;  Laterality: N/A;   IR IMAGING GUIDED PORT INSERTION  04/09/2022   MASS EXCISION Right 08/14/2017   Procedure: EXCISION RIGHT TEMPORAL MASS SUBMENTAL MASS;  Surgeon: Carloyn Manner, MD;  Location: [place];  Service: ENT;  Laterality: Right;  sleep apnea   RADIOACTIVE SEED IMPLANT N/A 01/26/2019   Procedure: RADIOACTIVE SEED IMPLANT/BRACHYTHERAPY IMPLANT;  Surgeon: Billey Co, MD;  Location: [facility] OR[facility];  Service: Urology;  Laterality: N/A;   SEPTOPLASTY  2016    [place]   VOLUME STUDY N/A 12/29/2018   Procedure: VOLUME STUDY;  Surgeon: Billey Co, MD;  Location: [facility] ORS;  Service: Urology;  Laterality: N/A;   Patient Active Problem List   Diagnosis Date Noted   Feels cold 07/05/2022   Sinusitis, acute 05/30/2022   Mass of soft tissue 05/16/2022   Blurred vision 05/16/2022   Hypotension 05/09/2022   Thrush 05/09/2022   Mucositis 04/11/2022   Sinusitis, chronic 04/04/2022   Encounter for antineoplastic chemotherapy 03/28/2022   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[place]) 03/22/2022   Goals of care, counseling/discussion 03/22/2022   Carotid sinus syndrome 03/22/2022   Neck pain 03/22/2022   Mass of right side of neck    Anemia 02/27/2022   Tonsillar mass 02/27/2022   Overweight (BMI 25.0-29.9) 02/27/2022   Syncope 02/26/2022   Pain in joint of left elbow 01/10/2022   Chronic obstructive pulmonary disease ([place]) 06/17/2020   Prostate cancer ([place]) 03/31/2019   Mass of upper lobe of left lung 12/02/2017   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   Tobacco use disorder 11/06/2017   Cervical spondylosis without myelopathy 08/27/2017   Facet arthritis of cervical region 08/27/2017   Obstructive sleep apnea 03/13/2017   Pulmonary hypertension, unspecified ([place]) 01/02/2017   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   Essential hypertension 12/27/2016  Tachycardia 12/27/2016    PCP: Mechele Claude, FNP   REFERRING PROVIDER: [name] MD    REFERRING DIAG: C09.9 (ICD-10-CM) - 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[place])   THERAPY DIAG:  Cervicalgia  Neck pain on right side  Chronic neck pain  Rationale for Evaluation and Treatment: Rehabilitation  ONSET DATE: 02/2022  SUBJECTIVE:                                                                                                                                                                                                          SUBJECTIVE STATEMENT: Pt denies any pain upon arrival to the clinic.  Pt reports continued pain with looking down at the flor but neck mobility in other directions is improving.   PERTINENT HISTORY:  Pt reports neck pain since onset of cancer in August of 2023. Pt has pain primarily on the right side and in the posterior aspect of the neck. Pt previously had pain for at least last 5 years on the left side of his neck but the pain is now primarily on the right side of his neck. Pain is very limiting in his ability to complete daily activities without rest breaks. Pt has history of "degenerative changes in the neck" and cervical lymph node metastasis that impact his neck pain. Pt has history of headaches " all the time".    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 Recommend concurrent chemotherapy with radiation.  OT SCREEN 05/23/22: Patient arrive with reports of having trouble swallowing as well as posterior neck pain.  Neck feeling heavy at times during the day hard time keeping it up.  Patient receiving radiation mostly on the right side of the neck.  Reports increased tightness and pain with motion. Upon asking patient about referral to speech therapy.  Patient reports they called him but did not know they were addressing swallowing issues.  Request if they can contact him again because of swallowing issues. Message sent to SLP to call pt again. Patient report discomfort mostly on posterior and right side of neck. Patient show decreased cervical extension more than flexion  Rotation to the right and left 40 degrees. Lateral flexion to the right 30 into the left 20. Patient was educated in correct posture for sitting and sleeping.  Patient was propping neck up with 1 or 2 pillows pushing head and cervical flexion.  Educated on correct positioning when sitting up  in  bed watching TV or in recliner during the day. Patient is a side sleeper mostly. Educated patient on doing a small roll under her neck in supine doing gentle active range of motion in supine for cervical rotation, extension and lateral flexion. Reinforced pain-free gentle motion 6-8 reps 3 times a day. Patient wants to follow-up in 2 weeks - if continued to have issues we will request PT eval and treat order.       OT SCREEN 06/06/22: Patient follow-up today after being seen 2 weeks ago.  Referred to speech therapy.  Patient was evaluated and is followed by speech therapy.  Patient reports pain in his neck is better but still feel it on the posterior neck as well as  well as head gets tired at times. Pt is doing much better with how he is sitting and supine with his posture /positioning of neck. Patient do show increase cervical rotation and lateral flexion compared to 2 weeks ago. Rotation to the left 50, rotation to the right 55 degrees. Lateral flexion to the left 30 into the right 38. Did recommend for patient to maybe follow-up with physical therapy for evaluation and treat for cervical range of motion and decreasing pain.  Patient in agreement. Patient did finish radiation but is scheduled for (per patient )a x-ray tomorrow.  PAIN:  Are you having pain? Yes: NPRS scale: 0/10 Pain location:   Pain description:  Aggravating factors: Relieving factors:   PRECAUTIONS: Other: cancer, MD recommends no heat until follow up pt uncomfortable with heat applied to this area.   WEIGHT BEARING RESTRICTIONS: No  FALLS:  Has patient fallen in last 6 months? No  LIVING ENVIRONMENT: Lives with: lives alone Lives in: House/apartment Stairs: No Has following equipment at home: None  OCCUPATION: retired   PLOF: Independent and has pain limiting his ability to complete daily activities, used to like to go fishing but has not been able due to neck. Had pain on the left sid eprior to cancer  that also got up to 10.   PATIENT GOALS: Improve pain levels.   NEXT MD VISIT:   OBJECTIVE:   DIAGNOSTIC FINDINGS:  Neck soft tissue only MRI cervical region on 02/27/22 IMPRESSION: Constellation of findings most compatible with a roughly 2.4 cm Right Tonsillar Carcinoma (series 12, image 11) with associated right level 2 and level 3 malignant nodal, ex-nodal disease (series 14, image 13). Recommend ENT consultation.  PATIENT SURVEYS:  FOTO 26 risk adjusted goal of 42   COGNITION: Overall cognitive status: Within functional limits for tasks assessed  SENSATION: WFL  POSTURE: rounded shoulders, forward head, and posterior pelvic tilt  PALPATION: TTP on bilateral suboccipital musculature, cervical paraspinals and R > L upper trapezius musculature.    CERVICAL ROM:   Active ROM A/PROM (deg) eval  Flexion 52*  Extension 31  Right lateral flexion 15  Left lateral flexion 11*  Right rotation 39  Left rotation 41  Thoracic rotation left  Limited*  Thoracic rotation right  WNL   (Blank rows = not tested) *pain   UPPER EXTREMITY ROM:  Active ROM Right eval Left eval  Shoulder flexion WNL WNL   Shoulder extension WNL WNL  Shoulder abduction WNL Limited   Shoulder adduction WNL WNL  Shoulder extension    Shoulder internal rotation    Shoulder external rotation    Elbow flexion    Elbow extension    Wrist flexion    Wrist extension    Wrist ulnar deviation  Wrist radial deviation    Wrist pronation    Wrist supination     (Blank rows = not tested)  UPPER EXTREMITY MMT:  MMT Right eval Left eval  Shoulder flexion WNL 3+  Shoulder extension WNL 5*  Shoulder abduction WNL 3+  Shoulder adduction WNL 5*  Shoulder extension WNL 5*  Shoulder internal rotation    Shoulder external rotation    Middle trapezius    Lower trapezius     (Blank rows = not tested) * not measured against gravity   CERVICAL SPECIAL TESTS:  Spurling's test: Negative and  Distraction test: Positive  FUNCTIONAL TESTS:  NDI: 33  TODAY'S TREATMENT:                                                                                                                              DATE: 07/10/22  Seated scapular retractions with BTB applied to resistance, 2x15;   Seated thoracic extensions over foam roller in armchair, 2x15 with 3 sec holds   Side-lying thoracic rotation (open book) 10 x 5 sec on ea side   Seated ball roll-outs for improved spinal mobilization, forward/lateral to each side, x5 each direction with 5 sec holds  Supine chin tuck combines with cervical flexion 2 x 10 reps for improved cervical flexor strength and endurance   Manual:  Gentle grade 1/2 P/A ( UPA and BPA) mobilizations of cervical region for improved joint stiffness/pain with cervical motion  Supine STM to cervical region to increase extensibility of the paraspinals Supine suboccipital release technique to decrease cervicalgia Supine manual traction performed in order to increase joint space in cervical region for pain relief Supine UT/Levator stretch, 45 sec bouts to increase tissue extensibility of the cervical region    PATIENT EDUCATION:  Education details: PHEP Person educated: Patient Education method: Explanation Education comprehension: verbalized understanding  HOME EXERCISE PROGRAM: To begin next session, UT and levator stretch, postural muscle activation activities   ASSESSMENT:  CLINICAL IMPRESSION:  Pt responded well to the exercises given to him today and put forth good effort. Pt progressed with cervical strengthening exercises with good response this date and with no increase in pain. Pt continues to have significant pain with prolonged cervical flexion which limits him in many daily activities, several interventions were added to address this this session and pt instructed in ways to help this at home.  Pt will continue to benefit from skilled therapy to address  remaining deficits in order to improve overall QoL and return to PLOF.       OBJECTIVE IMPAIRMENTS: decreased activity tolerance, decreased ROM, hypomobility, impaired perceived functional ability, impaired flexibility, and pain.   ACTIVITY LIMITATIONS: carrying, sitting, and standing  PARTICIPATION LIMITATIONS: meal prep, cleaning, laundry, driving, and community activity  PERSONAL FACTORS: 3+ comorbidities: DDD, HTN, COPD  are also affecting patient's functional outcome.   REHAB POTENTIAL: Fair chronicity of left sided neck pain (>5 years of significantly limiting pain) and ongoing  uncertainty with cancer progression  CLINICAL DECISION MAKING: Evolving/moderate complexity  EVALUATION COMPLEXITY: Moderate   GOALS: Goals reviewed with patient? Yes  SHORT TERM GOALS: Target date: 07/17/2022    Patient will be independent with home exercise program in order to improve neck pain and range of motion Baseline: No Home exercise program at this time Goal status: INITIAL   LONG TERM GOALS: Target date: 08/14/2022    Patient will improve cervical range of motion by 15 degrees with lateral flexion by 10 degrees with cervical rotation in order to improve patient's function Baseline: See evaluation table Goal status: INITIAL  2.  Patient will improve focus on therapeutic outcomes survey to 42 or greater to indicate improved subjective rating of neck function and pain. Baseline: 26 Goal status: INITIAL  3.  Patient will improve neck disability index score by 10 points or greater in order to indicate statistically significant improvement in neck related functional capacity. Baseline: 33 Goal status: INITIAL  4.  Patient will improve right shoulder flexion and abduction strength to 4 out of 5 or greater in order to indicate improved strength on the right side and improved lead to complete functional activities with the right upper extremity without pain or limitation Baseline: See  evaluation chart Goal status: INITIAL     PLAN:  PT FREQUENCY: 2x/week  PT DURATION: 8 weeks  PLANNED INTERVENTIONS: Therapeutic exercises, Therapeutic activity, Neuromuscular re-education, Balance training, Gait training, Patient/Family education, Self Care, Joint mobilization, Joint manipulation, Spinal mobilization, and Manual therapy  PLAN FOR NEXT SESSION:   progress home exercise program if appropriate, manual therapy for improvement in pain, continue POC for cervical and thoracic mobility and cervical stability, interventions for cervical flexion pain improvement   Particia Lather PT   07/10/22, 11:43 AM

## 2022-07-10 NOTE — Progress Notes (Signed)
Virtual Visit via Telephone Note  I connected with Clarence Skinner on 07/10/22 at  1:00 PM EST by telephone and verified that I am speaking with the correct person using two identifiers.  Location: Patient: Home Provider: Clinic   I discussed the limitations, risks, security and privacy concerns of performing an evaluation and management service by telephone and the availability of in person appointments. I also discussed with the patient that there may be a patient responsible charge related to this service. The patient expressed understanding and agreed to proceed.   History of Present Illness: Clarence Skinner is a 61 y.o. 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  Patient is on concurrent chemoradiation.   Observations/Objective: I called and spoke with patient by phone.  Patient reports he is doing well.  He denies any significant changes or concerns.  No symptomatic complaints at present.  He says that he is eating and drinking well.  Performance status is stable.  Patient is scheduled to establish with new PCP in January.  Assessment and Plan: Stage IV right tonsillar squamous cell carcinoma -on concurrent chemoradiation.  Patient appears to be doing reasonably well.  He requests referral to OT for upper extremity exercises.  Follow Up Instructions: Follow-up telephone visit 2 months   I discussed the assessment and treatment plan with the patient. The patient was provided an opportunity to ask questions and all were answered. The patient agreed with the plan and demonstrated an understanding of the instructions.   The patient was advised to call back or seek an in-person evaluation if the symptoms worsen or if the condition fails to improve as anticipated.  I provided 10 minutes of non-face-to-face time during this encounter.   Irean Hong, NP

## 2022-07-12 DIAGNOSIS — Z85818 Personal history of malignant neoplasm of other sites of lip, oral cavity, and pharynx: Secondary | ICD-10-CM | POA: Diagnosis not present

## 2022-07-12 DIAGNOSIS — J019 Acute sinusitis, unspecified: Secondary | ICD-10-CM | POA: Diagnosis not present

## 2022-07-12 DIAGNOSIS — J320 Chronic maxillary sinusitis: Secondary | ICD-10-CM | POA: Diagnosis not present

## 2022-07-12 DIAGNOSIS — Z7689 Persons encountering health services in other specified circumstances: Secondary | ICD-10-CM | POA: Diagnosis not present

## 2022-07-13 ENCOUNTER — Ambulatory Visit: Payer: Medicaid Other

## 2022-07-19 ENCOUNTER — Ambulatory Visit: Payer: Medicaid Other | Admitting: Physical Therapy

## 2022-07-19 DIAGNOSIS — M542 Cervicalgia: Secondary | ICD-10-CM | POA: Diagnosis not present

## 2022-07-19 DIAGNOSIS — G8929 Other chronic pain: Secondary | ICD-10-CM

## 2022-07-19 NOTE — Therapy (Signed)
OUTPATIENT PHYSICAL THERAPY CERVICAL TREATMENT    Patient Name: Clarence Skinner MRN: [id number] DOB:[birth year], 61 y.o., male Today's Date: 07/19/2022  END OF SESSION:  PT End of Session - 07/19/22 1542     Visit Number 7    Number of Visits 16    Date for PT Re-Evaluation 08/14/22    Authorization Type Medicaid    Authorization - Visit Number 7    Authorization - Number of Visits 12    Progress Note Due on Visit 10    PT Start Time 1600    PT Stop Time 1640    PT Time Calculation (min) 40 min    Activity Tolerance Patient tolerated treatment well    Behavior During Therapy [facility] for tasks assessed/performed               Past Medical History:  Diagnosis Date   COPD (chronic obstructive pulmonary disease) (HCC)    GERD (gastroesophageal reflux disease)    Gout    Hypertension    Multilevel degenerative disc disease    Pneumonia 11/29/2017   Sleep apnea    CPAP   Tachycardia    Wears dentures    partial upper and lower   Past Surgical History:  Procedure Laterality Date   ABCESS DRAINAGE  2009   tonsil   COLONOSCOPY WITH PROPOFOL N/A 02/11/2018   Procedure: COLONOSCOPY WITH PROPOFOL;  Surgeon: Toledo, Benay Pike, MD;  Location: [facility];  Service: Endoscopy;  Laterality: N/A;   ESOPHAGOGASTRODUODENOSCOPY (EGD) WITH PROPOFOL N/A 02/11/2018   Procedure: ESOPHAGOGASTRODUODENOSCOPY (EGD) WITH PROPOFOL;  Surgeon: Toledo, Benay Pike, MD;  Location: [facility];  Service: Endoscopy;  Laterality: N/A;   IR IMAGING GUIDED PORT INSERTION  04/09/2022   MASS EXCISION Right 08/14/2017   Procedure: EXCISION RIGHT TEMPORAL MASS SUBMENTAL MASS;  Surgeon: Carloyn Manner, MD;  Location: [facility];  Service: ENT;  Laterality: Right;  sleep apnea   RADIOACTIVE SEED IMPLANT N/A 01/26/2019   Procedure: RADIOACTIVE SEED IMPLANT/BRACHYTHERAPY IMPLANT;  Surgeon: Billey Co, MD;  Location: [facility];  Service: Urology;  Laterality: N/A;   SEPTOPLASTY  2016    [place], [place]   VOLUME STUDY N/A 12/29/2018   Procedure: VOLUME STUDY;  Surgeon: Billey Co, MD;  Location: [facility];  Service: Urology;  Laterality: N/A;   Patient Active Problem List   Diagnosis Date Noted   Feels cold 07/05/2022   Sinusitis, acute 05/30/2022   Mass of soft tissue 05/16/2022   Blurred vision 05/16/2022   Hypotension 05/09/2022   Thrush 05/09/2022   Mucositis 04/11/2022   Sinusitis, chronic 04/04/2022   Encounter for antineoplastic chemotherapy 03/28/2022   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   Goals of care, counseling/discussion 03/22/2022   Carotid sinus syndrome 03/22/2022   Neck pain 03/22/2022   Mass of right side of neck    Anemia 02/27/2022   Tonsillar mass 02/27/2022   Overweight (BMI 25.0-29.9) 02/27/2022   Syncope 02/26/2022   Pain in joint of left elbow 01/10/2022   Chronic obstructive pulmonary disease ([place]) 06/17/2020   Prostate cancer ([place]) 03/31/2019   Mass of upper lobe of left lung 12/02/2017   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   Tobacco use disorder 11/06/2017   Cervical spondylosis without myelopathy 08/27/2017   Facet arthritis of cervical region 08/27/2017   Obstructive sleep apnea 03/13/2017   Pulmonary hypertension, unspecified ([place]) 01/02/2017   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   Essential hypertension 12/27/2016  Tachycardia 12/27/2016    PCP: Mechele Claude, FNP   REFERRING PROVIDER: [name] MD    REFERRING DIAG: C09.9 (ICD-10-CM) - 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 Pt reports significant improvement in neck pain since last session and improved ability to look down and read as well as with looking down when doing dishes. Pt has been more diligent with Hep which he relates to helping with neck pain.   PERTINENT HISTORY:  Pt reports neck pain since onset of cancer in August of 2023. Pt has pain primarily on the right side and in the posterior aspect of the neck. Pt previously had pain for at least last 5 years on the left side of his neck but the pain is now primarily on the right side of his neck. Pain is very limiting in his ability to complete daily activities without rest breaks. Pt has history of "degenerative changes in the neck" and cervical lymph node metastasis that impact his neck pain. Pt has history of headaches " all the time".    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 Recommend concurrent chemotherapy with radiation.  OT SCREEN 05/23/22: Patient arrive with reports of having trouble swallowing as well as posterior neck pain.  Neck feeling heavy at times during the day hard time keeping it up.  Patient receiving radiation mostly on the right side of the neck.  Reports increased tightness and pain with motion. Upon asking patient about referral to speech therapy.  Patient reports they called him but did not know they were addressing swallowing issues.  Request if they can contact him again because of swallowing issues. Message sent to SLP to call pt again. Patient report discomfort mostly on posterior and right side of neck. Patient show decreased cervical extension more than flexion  Rotation to the right and left 40 degrees. Lateral flexion to the right 30 into the left 20. Patient was educated in correct posture for sitting and sleeping.   Patient  was propping neck up with 1 or 2 pillows pushing head and cervical flexion.  Educated on correct positioning when sitting up in bed watching TV or in recliner during the day. Patient is a side sleeper mostly. Educated patient on doing a small roll under her neck in supine doing gentle active range of motion in supine for cervical rotation, extension and lateral flexion. Reinforced pain-free gentle motion 6-8 reps 3 times a day. Patient wants to follow-up in 2 weeks - if continued to have issues we will request PT eval and treat order.       OT SCREEN 06/06/22: Patient follow-up today after being seen 2 weeks ago.  Referred to speech therapy.  Patient was evaluated and is followed by speech therapy.  Patient reports pain in his neck is better but still feel it on the posterior neck as well as  well as head gets tired at times. Pt is doing much better with how he is sitting and supine with his posture /positioning of neck. Patient do show increase cervical rotation and lateral flexion compared to 2 weeks ago. Rotation to the left 50, rotation to the right 55 degrees. Lateral flexion to the left 30 into the right 38. Did recommend for patient to maybe follow-up with physical therapy for evaluation and treat for cervical range of motion and decreasing pain.  Patient in agreement. Patient did finish radiation but is scheduled for (per patient )a x-ray tomorrow.  PAIN:  Are you having pain? Yes: NPRS scale: 0/10 Pain location:   Pain description:  Aggravating factors: Relieving factors:   PRECAUTIONS: Other: cancer, MD recommends no heat until follow up pt uncomfortable with heat applied to this area.   WEIGHT BEARING RESTRICTIONS: No  FALLS:  Has patient fallen in last 6 months? No  LIVING ENVIRONMENT: Lives with: lives alone Lives in: House/apartment Stairs: No Has following equipment at home: None  OCCUPATION: retired   PLOF: Independent and has pain limiting his ability  to complete daily activities, used to like to go fishing but has not been able due to neck. Had pain on the left sid eprior to cancer that also got up to 10.   PATIENT GOALS: Improve pain levels.   NEXT MD VISIT:   OBJECTIVE:   DIAGNOSTIC FINDINGS:  Neck soft tissue only MRI cervical region on 02/27/22 IMPRESSION: Constellation of findings most compatible with a roughly 2.4 cm Right Tonsillar Carcinoma (series 12, image 11) with associated right level 2 and level 3 malignant nodal, ex-nodal disease (series 14, image 13). Recommend ENT consultation.  PATIENT SURVEYS:  FOTO 26 risk adjusted goal of 42   COGNITION: Overall cognitive status: Within functional limits for tasks assessed  SENSATION: WFL  POSTURE: rounded shoulders, forward head, and posterior pelvic tilt  PALPATION: TTP on bilateral suboccipital musculature, cervical paraspinals and R > L upper trapezius musculature.    CERVICAL ROM:   Active ROM A/PROM (deg) eval  Flexion 52*  Extension 31  Right lateral flexion 15  Left lateral flexion 11*  Right rotation 39  Left rotation 41  Thoracic rotation left  Limited*  Thoracic rotation right  WNL   (Blank rows = not tested) *pain   UPPER EXTREMITY ROM:  Active ROM Right eval Left eval  Shoulder flexion WNL WNL   Shoulder extension WNL WNL  Shoulder abduction WNL Limited   Shoulder adduction WNL WNL  Shoulder extension    Shoulder internal rotation    Shoulder external rotation    Elbow  flexion    Elbow extension    Wrist flexion    Wrist extension    Wrist ulnar deviation    Wrist radial deviation    Wrist pronation    Wrist supination     (Blank rows = not tested)  UPPER EXTREMITY MMT:  MMT Right eval Left eval  Shoulder flexion WNL 3+  Shoulder extension WNL 5*  Shoulder abduction WNL 3+  Shoulder adduction WNL 5*  Shoulder extension WNL 5*  Shoulder internal rotation    Shoulder external rotation    Middle trapezius    Lower  trapezius     (Blank rows = not tested) * not measured against gravity   CERVICAL SPECIAL TESTS:  Spurling's test: Negative and Distraction test: Positive  FUNCTIONAL TESTS:  NDI: 33  TODAY'S TREATMENT:                                                                                                                              DATE: 07/19/22  Seated scapular retractions with BTB applied to resistance, 2x15;   Seated thoracic extensions over foam roller in armchair, 2x15 with 3 sec holds   Side-lying thoracic rotation (open book) 10 x 5 sec on ea side   Seated ball roll-outs for improved spinal mobilization, forward/lateral to each side, x5 each direction with 5 sec holds  Supine chin tuck combines with cervical flexion 2 x 10 reps for improved cervical flexor strength and endurance   Standing shoulder extensions 2 x 15 reps with GreenTB  Supine shoulder flexion holding Green TB at shoulder width   Manual:  Supine STM to cervical region to increase extensibility of the paraspinals Supine suboccipital release technique to decrease cervicalgia Supine manual traction performed in order to increase joint space in cervical region for pain relief Supine UT/Levator stretch, 45 sec bouts to increase tissue extensibility of the cervical region    PATIENT EDUCATION:  Education details: PHEP Person educated: Patient Education method: Explanation Education comprehension: verbalized understanding  HOME EXERCISE PROGRAM: To begin next session, UT and levator stretch, postural muscle activation activities   ASSESSMENT:  CLINICAL IMPRESSION:  Pt responded well to the exercises given to him today and put forth good effort. Pt progressed with cervical and thoracic strengthening exercises with good response this date and with no increase in pain. Pt reports improved pain with activities such as looking down and relates to compliance with HEP. Pt will continue to benefit from skilled  physical therapy intervention to address impairments, improve QOL, and attain therapy goals.       OBJECTIVE IMPAIRMENTS: decreased activity tolerance, decreased ROM, hypomobility, impaired perceived functional ability, impaired flexibility, and pain.   ACTIVITY LIMITATIONS: carrying, sitting, and standing  PARTICIPATION LIMITATIONS: meal prep, cleaning, laundry, driving, and community activity  PERSONAL FACTORS: 3+ comorbidities: DDD, HTN, COPD  are also affecting patient's functional outcome.   REHAB POTENTIAL: Fair chronicity of left sided neck pain (>5 years of significantly  limiting pain) and ongoing uncertainty with cancer progression  CLINICAL DECISION MAKING: Evolving/moderate complexity  EVALUATION COMPLEXITY: Moderate   GOALS: Goals reviewed with patient? Yes  SHORT TERM GOALS: Target date: 07/17/2022    Patient will be independent with home exercise program in order to improve neck pain and range of motion Baseline: No Home exercise program at this time Goal status: INITIAL   LONG TERM GOALS: Target date: 08/14/2022    Patient will improve cervical range of motion by 15 degrees with lateral flexion by 10 degrees with cervical rotation in order to improve patient's function Baseline: See evaluation table Goal status: INITIAL  2.  Patient will improve focus on therapeutic outcomes survey to 42 or greater to indicate improved subjective rating of neck function and pain. Baseline: 26 Goal status: INITIAL  3.  Patient will improve neck disability index score by 10 points or greater in order to indicate statistically significant improvement in neck related functional capacity. Baseline: 33 Goal status: INITIAL  4.  Patient will improve right shoulder flexion and abduction strength to 4 out of 5 or greater in order to indicate improved strength on the right side and improved lead to complete functional activities with the right upper extremity without pain or  limitation Baseline: See evaluation chart Goal status: INITIAL     PLAN:  PT FREQUENCY: 2x/week  PT DURATION: 8 weeks  PLANNED INTERVENTIONS: Therapeutic exercises, Therapeutic activity, Neuromuscular re-education, Balance training, Gait training, Patient/Family education, Self Care, Joint mobilization, Joint manipulation, Spinal mobilization, and Manual therapy  PLAN FOR NEXT SESSION:   progress home exercise program if appropriate, manual therapy for improvement in pain, continue POC for cervical and thoracic mobility and cervical stability, interventions for cervical flexion pain improvement   Particia Lather PT   07/19/22, 5:08 PM

## 2022-07-23 DIAGNOSIS — Z419 Encounter for procedure for purposes other than remedying health state, unspecified: Secondary | ICD-10-CM | POA: Diagnosis not present

## 2022-07-24 ENCOUNTER — Ambulatory Visit: Payer: Medicaid Other | Attending: Oncology | Admitting: Physical Therapy

## 2022-07-24 DIAGNOSIS — M542 Cervicalgia: Secondary | ICD-10-CM | POA: Insufficient documentation

## 2022-07-24 DIAGNOSIS — G8929 Other chronic pain: Secondary | ICD-10-CM | POA: Insufficient documentation

## 2022-07-27 DIAGNOSIS — J320 Chronic maxillary sinusitis: Secondary | ICD-10-CM | POA: Diagnosis not present

## 2022-07-27 DIAGNOSIS — J310 Chronic rhinitis: Secondary | ICD-10-CM | POA: Diagnosis not present

## 2022-07-27 DIAGNOSIS — Z7689 Persons encountering health services in other specified circumstances: Secondary | ICD-10-CM | POA: Diagnosis not present

## 2022-07-27 DIAGNOSIS — Z85818 Personal history of malignant neoplasm of other sites of lip, oral cavity, and pharynx: Secondary | ICD-10-CM | POA: Diagnosis not present

## 2022-07-31 ENCOUNTER — Encounter: Payer: Self-pay | Admitting: Physical Therapy

## 2022-07-31 ENCOUNTER — Ambulatory Visit: Payer: Medicaid Other | Admitting: Physical Therapy

## 2022-07-31 DIAGNOSIS — G8929 Other chronic pain: Secondary | ICD-10-CM | POA: Diagnosis not present

## 2022-07-31 DIAGNOSIS — M542 Cervicalgia: Secondary | ICD-10-CM | POA: Diagnosis not present

## 2022-07-31 DIAGNOSIS — Z7689 Persons encountering health services in other specified circumstances: Secondary | ICD-10-CM | POA: Diagnosis not present

## 2022-07-31 NOTE — Therapy (Signed)
OUTPATIENT PHYSICAL THERAPY CERVICAL TREATMENT    Patient Name: Clarence Skinner MRN: [id number] DOB:[birth year], 62 y.o., male Today's Date: 07/31/2022  END OF SESSION:  PT End of Session - 07/31/22 1022     Visit Number 8    Number of Visits 16    Date for PT Re-Evaluation 08/14/22    Authorization Type Medicaid    Authorization - Number of Visits 12    Progress Note Due on Visit 10    PT Start Time 1017    PT Stop Time 1058    PT Time Calculation (min) 41 min    Activity Tolerance Patient tolerated treatment well    Behavior During Therapy WFL for tasks assessed/performed               Past Medical History:  Diagnosis Date   COPD (chronic obstructive pulmonary disease) ([place])    GERD (gastroesophageal reflux disease)    Gout    Hypertension    Multilevel degenerative disc disease    Pneumonia 11/29/2017   Sleep apnea    CPAP   Tachycardia    Wears dentures    partial upper and lower   Past Surgical History:  Procedure Laterality Date   ABCESS DRAINAGE  2009   tonsil   COLONOSCOPY WITH PROPOFOL N/A 02/11/2018   Procedure: COLONOSCOPY WITH PROPOFOL;  Surgeon: Toledo, Benay Pike, MD;  Location: [facility];  Service: Endoscopy;  Laterality: N/A;   ESOPHAGOGASTRODUODENOSCOPY (EGD) WITH PROPOFOL N/A 02/11/2018   Procedure: ESOPHAGOGASTRODUODENOSCOPY (EGD) WITH PROPOFOL;  Surgeon: Toledo, Benay Pike, MD;  Location: [facility];  Service: Endoscopy;  Laterality: N/A;   IR IMAGING GUIDED PORT INSERTION  04/09/2022   MASS EXCISION Right 08/14/2017   Procedure: EXCISION RIGHT TEMPORAL MASS SUBMENTAL MASS;  Surgeon: Carloyn Manner, MD;  Location: [place];  Service: ENT;  Laterality: Right;  sleep apnea   RADIOACTIVE SEED IMPLANT N/A 01/26/2019   Procedure: RADIOACTIVE SEED IMPLANT/BRACHYTHERAPY IMPLANT;  Surgeon: Billey Co, MD;  Location: [facility];  Service: Urology;  Laterality: N/A;   SEPTOPLASTY  2016   [place]   VOLUME STUDY N/A 12/29/2018    Procedure: VOLUME STUDY;  Surgeon: Billey Co, MD;  Location: [facility];  Service: Urology;  Laterality: N/A;   Patient Active Problem List   Diagnosis Date Noted   Feels cold 07/05/2022   Sinusitis, acute 05/30/2022   Mass of soft tissue 05/16/2022   Blurred vision 05/16/2022   Hypotension 05/09/2022   Thrush 05/09/2022   Mucositis 04/11/2022   Sinusitis, chronic 04/04/2022   Encounter for antineoplastic chemotherapy 03/28/2022   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[place]) 03/22/2022   Goals of care, counseling/discussion 03/22/2022   Carotid sinus syndrome 03/22/2022   Neck pain 03/22/2022   Mass of right side of neck    Anemia 02/27/2022   Tonsillar mass 02/27/2022   Overweight (BMI 25.0-29.9) 02/27/2022   Syncope 02/26/2022   Pain in joint of left elbow 01/10/2022   Chronic obstructive pulmonary disease ([place]) 06/17/2020   Prostate cancer ([place]) 03/31/2019   Mass of upper lobe of left lung 12/02/2017   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   Tobacco use disorder 11/06/2017   Cervical spondylosis without myelopathy 08/27/2017   Facet arthritis of cervical region 08/27/2017   Obstructive sleep apnea 03/13/2017   Pulmonary hypertension, unspecified ([place]) 01/02/2017   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   Essential hypertension 12/27/2016   Tachycardia 12/27/2016    PCP: [place] Derry,  Donald Prose, FNP   REFERRING PROVIDER: [name] MD    REFERRING DIAG: C09.9 (ICD-10-CM) - 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 Pt reports significant improvement in neck pain since last session and improved ability to look down and read as well as with looking down when doing dishes. Pt has been more diligent with Hep which he relates to helping with neck pain.   PERTINENT HISTORY:  Pt reports neck pain since onset of cancer in August of 2023. Pt has pain primarily on the right side and in the posterior aspect of the neck. Pt previously had pain for at least last 5 years on the left side of his neck but the pain is now primarily on the right side of his neck. Pain is very limiting in his ability to complete daily activities without rest breaks. Pt has history of "degenerative changes in the neck" and cervical lymph node metastasis that impact his neck pain. Pt has history of headaches " all the time".    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 Recommend concurrent chemotherapy with radiation.  OT SCREEN 05/23/22: Patient arrive with reports of having trouble swallowing as well as posterior neck pain.  Neck feeling heavy at times during the day hard time keeping it up.  Patient receiving radiation mostly on the right side of the neck.  Reports increased tightness and pain with motion. Upon asking patient about referral to speech therapy.  Patient reports they called him but did not know they were addressing swallowing issues.  Request if they can contact him again because of swallowing issues. Message sent to SLP to call pt again. Patient report discomfort mostly on posterior and right side of neck. Patient show decreased cervical extension more than flexion  Rotation to the right and left 40 degrees. Lateral flexion to the right 30 into the left 20. Patient was educated in correct posture for sitting and sleeping.  Patient was propping neck up with 1 or 2  pillows  pushing head and cervical flexion.  Educated on correct positioning when sitting up in bed watching TV or in recliner during the day. Patient is a side sleeper mostly. Educated patient on doing a small roll under her neck in supine doing gentle active range of motion in supine for cervical rotation, extension and lateral flexion. Reinforced pain-free gentle motion 6-8 reps 3 times a day. Patient wants to follow-up in 2 weeks - if continued to have issues we will request PT eval and treat order.       OT SCREEN 06/06/22: Patient follow-up today after being seen 2 weeks ago.  Referred to speech therapy.  Patient was evaluated and is followed by speech therapy.  Patient reports pain in his neck is better but still feel it on the posterior neck as well as  well as head gets tired at times. Pt is doing much better with how he is sitting and supine with his posture /positioning of neck. Patient do show increase cervical rotation and lateral flexion compared to 2 weeks ago. Rotation to the left 50, rotation to the right 55 degrees. Lateral flexion to the left 30 into the right 38. Did recommend for patient to maybe follow-up with physical therapy for evaluation and treat for cervical range of motion and decreasing pain.  Patient in agreement. Patient did finish radiation but is scheduled for (per patient )a x-ray tomorrow.  PAIN:  Are you having pain? Yes: NPRS scale: 0/10 Pain location:   Pain description:  Aggravating factors: Relieving factors:   PRECAUTIONS: Other: cancer, MD recommends no heat until follow up pt uncomfortable with heat applied to this area.   WEIGHT BEARING RESTRICTIONS: No  FALLS:  Has patient fallen in last 6 months? No  LIVING ENVIRONMENT: Lives with: lives alone Lives in: House/apartment Stairs: No Has following equipment at home: None  OCCUPATION: retired   PLOF: Independent and has pain limiting his ability to complete daily activities, used to like to go  fishing but has not been able due to neck. Had pain on the left sid eprior to cancer that also got up to 10.   PATIENT GOALS: Improve pain levels.   NEXT MD VISIT:   OBJECTIVE:   DIAGNOSTIC FINDINGS:  Neck soft tissue only MRI cervical region on 02/27/22 IMPRESSION: Constellation of findings most compatible with a roughly 2.4 cm Right Tonsillar Carcinoma (series 12, image 11) with associated right level 2 and level 3 malignant nodal, ex-nodal disease (series 14, image 13). Recommend ENT consultation.  PATIENT SURVEYS:  FOTO 26 risk adjusted goal of 42   COGNITION: Overall cognitive status: Within functional limits for tasks assessed  SENSATION: WFL  POSTURE: rounded shoulders, forward head, and posterior pelvic tilt  PALPATION: TTP on bilateral suboccipital musculature, cervical paraspinals and R > L upper trapezius musculature.    CERVICAL ROM:   Active ROM A/PROM (deg) eval  Flexion 52*  Extension 31  Right lateral flexion 15  Left lateral flexion 11*  Right rotation 39  Left rotation 41  Thoracic rotation left  Limited*  Thoracic rotation right  WNL   (Blank rows = not tested) *pain   UPPER EXTREMITY ROM:  Active ROM Right eval Left eval  Shoulder flexion WNL WNL   Shoulder extension WNL WNL  Shoulder abduction WNL Limited   Shoulder adduction WNL WNL  Shoulder extension    Shoulder internal rotation    Shoulder external rotation    Elbow flexion    Elbow extension  Wrist flexion    Wrist extension    Wrist ulnar deviation    Wrist radial deviation    Wrist pronation    Wrist supination     (Blank rows = not tested)  UPPER EXTREMITY MMT:  MMT Right eval Left eval  Shoulder flexion WNL 3+  Shoulder extension WNL 5*  Shoulder abduction WNL 3+  Shoulder adduction WNL 5*  Shoulder extension WNL 5*  Shoulder internal rotation    Shoulder external rotation    Middle trapezius    Lower trapezius     (Blank rows = not tested) * not measured  against gravity   CERVICAL SPECIAL TESTS:  Spurling's test: Negative and Distraction test: Positive  FUNCTIONAL TESTS:  NDI: 33  TODAY'S TREATMENT:                                                                                                                              DATE: 07/31/22  Seated scapular retractions with BTB applied to resistance, 2x15;   Seated thoracic extensions over foam roller in armchair, 2x15 with 3 sec holds   Side-lying thoracic rotation (open book) 10 x 5 sec on ea side   Seated ball roll-outs for improved spinal mobilization, forward/lateral to each side, x5 each direction with 5 sec holds  Supine chin tuck combines with cervical flexion 2 x 10 reps for improved cervical flexor strength and endurance   Standing shoulder extensions 2 x 15 reps with GreenTB   Manual:  Supine STM to cervical region to increase extensibility of the paraspinals Supine suboccipital release technique to decrease cervicalgia Supine manual traction performed in order to increase joint space in cervical region for pain relief Supine UT/Levator stretch, 45 sec bouts to increase tissue extensibility of the cervical region    PATIENT EDUCATION:  Education details: PHEP Person educated: Patient Education method: Explanation Education comprehension: verbalized understanding  HOME EXERCISE PROGRAM: To begin next session, UT and levator stretch, postural muscle activation activities   ASSESSMENT:  CLINICAL IMPRESSION:  Pt responded well to the exercises given to him today and put forth good effort. Pt required further education regarding proper performance and prescription of therapeutic exercises as he had some pain and discomfort which was reported from improper performance of exercises. Pt responds well to education of exercises and is continuing to experience improved pain and cervical range of motion. Pt reports improved pain with activities such as looking down and relates  to compliance with HEP. Pt will continue to benefit from skilled physical therapy intervention to address impairments, improve QOL, and attain therapy goals.       OBJECTIVE IMPAIRMENTS: decreased activity tolerance, decreased ROM, hypomobility, impaired perceived functional ability, impaired flexibility, and pain.   ACTIVITY LIMITATIONS: carrying, sitting, and standing  PARTICIPATION LIMITATIONS: meal prep, cleaning, laundry, driving, and community activity  PERSONAL FACTORS: 3+ comorbidities: DDD, HTN, COPD  are also affecting patient's functional outcome.   REHAB POTENTIAL: Fair chronicity of  left sided neck pain (>5 years of significantly limiting pain) and ongoing uncertainty with cancer progression  CLINICAL DECISION MAKING: Evolving/moderate complexity  EVALUATION COMPLEXITY: Moderate   GOALS: Goals reviewed with patient? Yes  SHORT TERM GOALS: Target date: 07/17/2022    Patient will be independent with home exercise program in order to improve neck pain and range of motion Baseline: No Home exercise program at this time Goal status: INITIAL   LONG TERM GOALS: Target date: 08/14/2022    Patient will improve cervical range of motion by 15 degrees with lateral flexion by 10 degrees with cervical rotation in order to improve patient's function Baseline: See evaluation table Goal status: INITIAL  2.  Patient will improve focus on therapeutic outcomes survey to 42 or greater to indicate improved subjective rating of neck function and pain. Baseline: 26 Goal status: INITIAL  3.  Patient will improve neck disability index score by 10 points or greater in order to indicate statistically significant improvement in neck related functional capacity. Baseline: 33 Goal status: INITIAL  4.  Patient will improve right shoulder flexion and abduction strength to 4 out of 5 or greater in order to indicate improved strength on the right side and improved lead to complete  functional activities with the right upper extremity without pain or limitation Baseline: See evaluation chart Goal status: INITIAL     PLAN:  PT FREQUENCY: 2x/week  PT DURATION: 8 weeks  PLANNED INTERVENTIONS: Therapeutic exercises, Therapeutic activity, Neuromuscular re-education, Balance training, Gait training, Patient/Family education, Self Care, Joint mobilization, Joint manipulation, Spinal mobilization, and Manual therapy  PLAN FOR NEXT SESSION:  Progress home exercise program if appropriate, manual therapy for improvement in pain, continue POC for cervical and thoracic mobility and cervical stability, interventions for cervical flexion pain improvement   Particia Lather PT   07/31/22, 10:22 AM

## 2022-08-01 ENCOUNTER — Ambulatory Visit: Payer: Medicaid Other | Admitting: Internal Medicine

## 2022-08-01 ENCOUNTER — Encounter: Payer: Self-pay | Admitting: Internal Medicine

## 2022-08-01 VITALS — BP 110/62 | HR 112 | Temp 98.4°F | Resp 16 | Ht 71.0 in | Wt 172.5 lb

## 2022-08-01 DIAGNOSIS — K219 Gastro-esophageal reflux disease without esophagitis: Secondary | ICD-10-CM | POA: Diagnosis not present

## 2022-08-01 DIAGNOSIS — M1A9XX0 Chronic gout, unspecified, without tophus (tophi): Secondary | ICD-10-CM

## 2022-08-01 DIAGNOSIS — G4733 Obstructive sleep apnea (adult) (pediatric): Secondary | ICD-10-CM | POA: Diagnosis not present

## 2022-08-01 DIAGNOSIS — Z1159 Encounter for screening for other viral diseases: Secondary | ICD-10-CM

## 2022-08-01 DIAGNOSIS — Z13220 Encounter for screening for lipoid disorders: Secondary | ICD-10-CM | POA: Diagnosis not present

## 2022-08-01 DIAGNOSIS — C099 Malignant neoplasm of tonsil, unspecified: Secondary | ICD-10-CM | POA: Diagnosis not present

## 2022-08-01 DIAGNOSIS — Z720 Tobacco use: Secondary | ICD-10-CM

## 2022-08-01 DIAGNOSIS — J449 Chronic obstructive pulmonary disease, unspecified: Secondary | ICD-10-CM | POA: Diagnosis not present

## 2022-08-01 DIAGNOSIS — R7303 Prediabetes: Secondary | ICD-10-CM

## 2022-08-01 DIAGNOSIS — I10 Essential (primary) hypertension: Secondary | ICD-10-CM

## 2022-08-01 DIAGNOSIS — Z8546 Personal history of malignant neoplasm of prostate: Secondary | ICD-10-CM

## 2022-08-01 DIAGNOSIS — Z7689 Persons encountering health services in other specified circumstances: Secondary | ICD-10-CM | POA: Diagnosis not present

## 2022-08-01 NOTE — Patient Instructions (Addendum)
It was great seeing you today!  Plan discussed at today's visit: -Monitor blood pressure at home, if <120/80 hold medication dose for day  -Referral to sleep doctor for sleep apnea  -Referral for lung cancer placed today -Consider switching over the counter anti-histamine to Zyrtec, Allegra, Xyzal  -Labs today  Follow up in: 1 month   Take care and let us know if you have any questions or concerns prior to your next visit.  Dr. Rosana Berger

## 2022-08-01 NOTE — Progress Notes (Signed)
New Patient Office Visit  Subjective    Patient ID: Clarence Skinner, male    DOB: April 05, 1961  Age: 62 y.o. MRN: [id number]  CC:  Chief Complaint  Patient presents with   Establish Care    Specialist: ENT, oncologist, ortho, pain clinic    HPI Clarence Skinner presents to establish care.  Hypertension/OSA: -Medications: Amlodipine-Benazepril 10-40 mg, Lasix 20 mg PRN leg swelling (has to take 1-2 a year) -Patient is compliant with above medications and reports no side effects but has not taken blood pressure medication yet today -Checking BP at home (average): No -Denies any SOB, CP, vision changes, LE edema but occasionally gets lightheaded and tired, uncertain if he needs full blood pressure dose -Unable to tolerate CPAP machine   COPD: -COPD status: stable -Current medications: Trelegy, Albuterol  -Satisfied with current treatment?: yes -Oxygen use: no -Dyspnea frequency: yes -Cough frequency: yes -Rescue inhaler frequency: monthly sometimes more -Limitation of activity: yes -Pneumovax: unknown - getting  vaccine records -Influenza: Up to Date  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, following with Oncology, note and labs reviewed from 07/05/22 -Had been on chemotherapy and radiation, planning for PET scan 1/17  History of Prostate cancer: -Following with Urology, note reviewed from 12/27/21 -First diagnosed in 07/2018 -Underwent brachytherapy with seen implant 01/2019 -No urinary complaints -Last PSA 5/23 stable at 0.3  GERD: -Currently on Protonix 40 mg, no symptoms  Gout: -Currently Allopurinol 300 mg -Usually flares in the big toe -No flares since being on Alloprinil   Health Maintenance: -Blood work due -Lung cancer screening due, history of pulmonary nodule on CT scan 2019 ? -Colon cancer screening due, will discuss at follow up -Obtaining vaccine history    Outpatient Encounter Medications as of 08/01/2022  Medication  Sig   albuterol (PROVENTIL HFA;VENTOLIN HFA) 108 (90 Base) MCG/ACT inhaler Inhale into the lungs every 6 (six) hours as needed for wheezing or shortness of breath.   allopurinol (ZYLOPRIM) 300 MG tablet Take 300 mg by mouth daily.   amLODipine-benazepril (LOTREL) 10-40 MG capsule Take 1 capsule by mouth daily.   Azelastine HCl 137 MCG/SPRAY SOLN PLACE 1 SPRAY INTO THE NOSE 2 (TWO) TIMES DAILY AS NEEDED.   baclofen (LIORESAL) 10 MG tablet Take 10 mg by mouth 2 (two) times daily as needed.   benzonatate (TESSALON) 100 MG capsule Take 1 capsule (100 mg total) by mouth every 8 (eight) hours as needed.   docusate sodium (COLACE) 100 MG capsule Take 100 mg by mouth daily as needed.   doxycycline (MONODOX) 100 MG capsule Take 100 mg by mouth 2 (two) times daily.   EQ BUDESONIDE NASAL NA Place into the nose.   fluticasone (FLONASE) 50 MCG/ACT nasal spray Place into both nostrils daily as needed for allergies or rhinitis.   Fluticasone-Umeclidin-Vilant (TRELEGY ELLIPTA) 100-62.5-25 MCG/ACT AEPB Trelegy Ellipta 100-62.5-25 MCG/INH Inhalation Aerosol Powder Breath Activated QTY: 90 each Days: 90 Refills: 1  Written: 11/02/20 Patient Instructions: One puff once a day   furosemide (LASIX) 20 MG tablet Take 20 mg by mouth as needed.   ibuprofen (ADVIL) 800 MG tablet    lidocaine-prilocaine (EMLA) cream Apply 1 Application topically as directed. Apply small amount of cream to port a cath site 1-2 hours prior to port being accessed.   loratadine (CLARITIN) 10 MG tablet Take 10 mg by mouth daily.   magic mouthwash (multi-ingredient) oral suspension Swish and spit 5-10 mls four times a day as needed   magic mouthwash w/lidocaine  SOLN Take 5 mLs by mouth 4 (four) times daily as needed for mouth pain. Sig: Swish/Spit 5-10 ml four times a day as needed. Dispense 480 ml. 1RF   meloxicam (MOBIC) 7.5 MG tablet Take 7.5 mg by mouth daily as needed for pain.   Oxycodone HCl 10 MG TABS Take 10 mg by mouth every 6 (six)  hours as needed.   pantoprazole (PROTONIX) 40 MG tablet Take 40 mg by mouth daily.   potassium chloride SA (KLOR-CON M) 20 MEQ tablet Take 1 tablet (20 mEq total) by mouth daily.   senna (SENOKOT) 8.6 MG TABS tablet Take 1 tablet (8.6 mg total) by mouth daily.   SYMBICORT 80-4.5 MCG/ACT inhaler Inhale 1 puff into the lungs 2 (two) times daily.   tadalafil (CIALIS) 20 MG tablet Take 1 tablet (20 mg total) by mouth daily.   Vitamin D, Ergocalciferol, (DRISDOL) 1.25 MG (50000 UNIT) CAPS capsule Take 50,000 Units by mouth once a week.   [DISCONTINUED] bacitracin-polymyxin b (POLYSPORIN) ophthalmic ointment Place 1 Application into both eyes every 12 (twelve) hours. apply to eye every 12 hours while awake   [DISCONTINUED] dexamethasone (DECADRON) 4 MG tablet Take 0.5 tablets (2 mg total) by mouth 2 (two) times daily with a meal. (Patient not taking: Reported on 07/05/2022)   [DISCONTINUED] ferrous sulfate 325 (65 FE) MG tablet Take 325 mg by mouth daily. (Patient not taking: Reported on 05/30/2022)   [DISCONTINUED] gabapentin (NEURONTIN) 100 MG capsule Take 1 capsule (100 mg total) by mouth at bedtime. (Patient not taking: Reported on 07/05/2022)   [DISCONTINUED] Morphine Sulfate (MORPHINE CONCENTRATE) 10 mg / 0.5 ml concentrated solution Take 0.5 mLs (10 mg total) by mouth every 6 (six) hours as needed for severe pain or moderate pain. (Patient not taking: Reported on 07/05/2022)   [DISCONTINUED] nystatin (MYCOSTATIN) 100000 UNIT/ML suspension Take 5 mLs (500,000 Units total) by mouth 4 (four) times daily. Swish and swallow (Patient not taking: Reported on 07/05/2022)   [DISCONTINUED] prednisoLONE (ORAPRED) 15 MG/5ML solution Take by mouth.   [DISCONTINUED] prochlorperazine (COMPAZINE) 10 MG tablet Take 1 tablet (10 mg total) by mouth every 6 (six) hours as needed for nausea or vomiting. (Patient not taking: Reported on 07/05/2022)   [DISCONTINUED] rosuvastatin (CRESTOR) 20 MG tablet Take 1 tablet (20 mg  total) by mouth daily. (Patient not taking: Reported on 07/05/2022)   [DISCONTINUED] sucralfate (CARAFATE) 1 GM/10ML suspension Take 10 mLs (1 g total) by mouth 4 (four) times daily -  with meals and at bedtime. (Patient not taking: Reported on 07/05/2022)   Facility-Administered Encounter Medications as of 08/01/2022  Medication   sodium chloride flush (NS) 0.9 % injection 10 mL    Past Medical History:  Diagnosis Date   COPD (chronic obstructive pulmonary disease) ([place])    ED (erectile dysfunction)    GERD (gastroesophageal reflux disease)    Gout    Hypertension    Multilevel degenerative disc disease    Pneumonia 11/29/2017   Prostate cancer ([place])    Sleep apnea    CPAP   Tachycardia    Wears dentures    partial upper and lower    Past Surgical History:  Procedure Laterality Date   ABCESS DRAINAGE  2009   tonsil   COLONOSCOPY WITH PROPOFOL N/A 02/11/2018   Procedure: COLONOSCOPY WITH PROPOFOL;  Surgeon: Toledo, Benay Pike, MD;  Location: [facility];  Service: Endoscopy;  Laterality: N/A;   ESOPHAGOGASTRODUODENOSCOPY (EGD) WITH PROPOFOL N/A 02/11/2018   Procedure: ESOPHAGOGASTRODUODENOSCOPY (EGD) WITH PROPOFOL;  Surgeon: Toledo, Benay Pike, MD;  Location: [facility];  Service: Endoscopy;  Laterality: N/A;   IR IMAGING GUIDED PORT INSERTION  04/09/2022   MASS EXCISION Right 08/14/2017   Procedure: EXCISION RIGHT TEMPORAL MASS SUBMENTAL MASS;  Surgeon: Carloyn Manner, MD;  Location: [place];  Service: ENT;  Laterality: Right;  sleep apnea   RADIOACTIVE SEED IMPLANT N/A 01/26/2019   Procedure: RADIOACTIVE SEED IMPLANT/BRACHYTHERAPY IMPLANT;  Surgeon: Billey Co, MD;  Location: [facility];  Service: Urology;  Laterality: N/A;   SEPTOPLASTY  2016   [place], [place]   VOLUME STUDY N/A 12/29/2018   Procedure: VOLUME STUDY;  Surgeon: Billey Co, MD;  Location: [facility];  Service: Urology;  Laterality: N/A;    Family History  Problem Relation Age of Onset    Anxiety disorder Mother    Cancer Father    Hypertension Sister    Diabetes Sister    Asthma Sister    Other Brother        mva  at age 52   Gout Brother    Hypertension Brother    Prostate cancer Maternal Uncle    Prostate cancer Maternal Uncle     Social History   Socioeconomic History   Marital status: Significant Other    Spouse name: Not on file   Number of children: Not on file   Years of education: Not on file   Highest education level: Not on file  Occupational History   Not on file  Tobacco Use   Smoking status: Every Day    Packs/day: 1.00    Years: 30.00    Total pack years: 30.00    Types: Cigarettes   Smokeless tobacco: Former    Types: Snuff, Chew    Quit date: 12/05/1984   Tobacco comments:    since age 48  Vaping Use   Vaping Use: Never used  Substance and Sexual Activity   Alcohol use: Not Currently    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   Drug use: Not Currently   Sexual activity: Yes  Other Topics Concern   Not on file  Social History Narrative   Not on file   Social Determinants of Health   Financial Resource Strain: High Risk (04/03/2022)   Overall Financial Resource Strain (CARDIA)    Difficulty of Paying Living Expenses: Hard  Food Insecurity: Food Insecurity Present (04/03/2022)   Hunger Vital Sign    Worried About Running Out of Food in the Last Year: Often true    Ran Out of Food in the Last Year: Often true  Transportation Needs: Unmet Transportation Needs (04/03/2022)   PRAPARE - Hydrologist (Medical): Yes    Lack of Transportation (Non-Medical): Yes  Physical Activity: Inactive (04/03/2022)   Exercise Vital Sign    Days of Exercise per Week: 0 days    Minutes of Exercise per Session: 0 min  Stress: Stress Concern Present (04/03/2022)   [employer] [place]    Feeling of Stress : Very much  Social Connections: Moderately  Isolated (04/03/2022)   Social Connection and Isolation Panel [NHANES]    Frequency of Communication with Friends and Family: Never    Frequency of Social Gatherings with Friends and Family: Three times a week    Attends Religious Services: Never    Active Member of Clubs or Organizations: No    Attends Archivist Meetings: Not on file  Marital Status: Living with partner  Intimate Partner Violence: Not At Risk (04/03/2022)   Humiliation, Afraid, Rape, and Kick questionnaire    Fear of Current or Ex-Partner: No    Emotionally Abused: No    Physically Abused: No    Sexually Abused: No    Review of Systems  Constitutional:  Negative for chills and fever.  Respiratory:  Positive for cough, shortness of breath and wheezing.   Cardiovascular:  Negative for chest pain.  Gastrointestinal:  Negative for abdominal pain.      Objective    BP 110/62   Pulse (!) 112   Temp 98.4 F (36.9 C)   Resp 16   Ht '5\' 11"'$  (1.803 m)   Wt 172 lb 8 oz (78.2 kg)   SpO2 (!) 18%   BMI 24.06 kg/m   Physical Exam Constitutional:      Appearance: Normal appearance.  HENT:     Head: Normocephalic and atraumatic.  Eyes:     Conjunctiva/sclera: Conjunctivae normal.  Cardiovascular:     Rate and Rhythm: Normal rate and regular rhythm.  Pulmonary:     Effort: Pulmonary effort is normal.     Breath sounds: Normal breath sounds.  Musculoskeletal:     Cervical back: No tenderness.     Right lower leg: No edema.     Left lower leg: No edema.  Skin:    General: Skin is warm and dry.  Neurological:     General: No focal deficit present.     Mental Status: He is alert. Mental status is at baseline.  Psychiatric:        Mood and Affect: Mood normal.        Behavior: Behavior normal.     Last CBC Lab Results  Component Value Date   WBC 4.9 07/05/2022   HGB 11.2 (L) 07/05/2022   HCT 34.2 (L) 07/05/2022   MCV 81.6 07/05/2022   MCH 26.7 07/05/2022   RDW 18.6 (H) 07/05/2022   PLT 413  (H) 15/40/0867   Last metabolic panel Lab Results  Component Value Date   GLUCOSE 112 (H) 07/05/2022   NA 137 07/05/2022   K 3.6 07/05/2022   CL 104 07/05/2022   CO2 24 07/05/2022   BUN 12 07/05/2022   CREATININE 0.72 07/05/2022   GFRNONAA >60 07/05/2022   CALCIUM 8.8 (L) 07/05/2022   PROT 7.1 07/05/2022   ALBUMIN 3.8 07/05/2022   BILITOT 0.4 07/05/2022   ALKPHOS 74 07/05/2022   AST 14 (L) 07/05/2022   ALT 11 07/05/2022   ANIONGAP 9 07/05/2022   Last lipids No results found for: "CHOL", "HDL", "LDLCALC", "LDLDIRECT", "TRIG", "CHOLHDL" Last hemoglobin A1c No results found for: "HGBA1C" Last thyroid functions Lab Results  Component Value Date   TSH 0.978 07/05/2022   Last vitamin D No results found for: "25OHVITD2", "25OHVITD3", "VD25OH" Last vitamin B12 and Folate No results found for: "VITAMINB12", "FOLATE"      Assessment & Plan:   1. Essential hypertension: Blood pressure on the lower end today, patient has not taken his medication yet.  He states sometimes he will feel lightheaded and fatigued.  He is currently on amlodipine-benazepril 10-40 mg daily.  He has a blood pressure cuff at home but is not checking currently, he will check his blood pressure once daily and if it is lower than 120/80 he will hold his medication.  Will write down his blood pressure values and follow-up here in 1 months we can determine if he continues  to require his current dose of medication.  Follow-up in 1 month.  2. Obstructive sleep apnea: Has a CPAP machine but is unable to tolerate it, states he needs a new titration study.  Referral to sleep medicine ordered today.  - Ambulatory referral to Pulmonology  3. Chronic obstructive pulmonary disease, unspecified COPD type ([place]): Stable, doing well on Trelegy and albuterol as needed.  He also has a DuoNeb neb machine to use as needed.  4. History of prostate cancer: Following with urology, note reviewed from 12/27/2021.  PSA last checked in  May of last year, has been stable.  Denies urinary symptoms today.  5. 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[place]): Following with oncology, note and labs reviewed from 07/10/2022.  He is currently undergoing chemotherapy and radiation.  He is scheduled for a PET scan on 08/08/2022.  6. Gastroesophageal reflux disease, unspecified whether esophagitis present: Stable on Protonix 40 mg  7. Chronic gout involving toe without tophus, unspecified cause, unspecified laterality: Doing well, has not had a flare since he started prophylactic allopurinol 300 mg daily.  8. Encounter for hepatitis C screening test for low risk patient/Lipid screening: Screening labs due.  - Hepatitis C Antibody - Lipid Profile  9. Prediabetes: He was told he was prediabetic for years ago when he was living in [place], he has not had an A1c in multiple years, will check today.  - HgB A1c  10. Tobacco use: Lung cancer screening referral placed.  - Ambulatory Referral Lung Cancer Screening [facility]   Return in about 4 weeks (around 08/29/2022).   Teodora Medici, DO

## 2022-08-02 ENCOUNTER — Ambulatory Visit: Payer: Medicaid Other | Admitting: Physical Therapy

## 2022-08-02 DIAGNOSIS — G8929 Other chronic pain: Secondary | ICD-10-CM | POA: Diagnosis not present

## 2022-08-02 DIAGNOSIS — Z7689 Persons encountering health services in other specified circumstances: Secondary | ICD-10-CM | POA: Diagnosis not present

## 2022-08-02 DIAGNOSIS — M542 Cervicalgia: Secondary | ICD-10-CM

## 2022-08-02 LAB — LIPID PANEL
Cholesterol: 152 mg/dL (ref <200)
HDL: 54 mg/dL (ref >=40)
LDL Cholesterol (Calc): 82 mg/dL (calc)
Non-HDL Cholesterol (Calc): 98 mg/dL (calc) (ref <130)
Total CHOL/HDL Ratio: 2.8 (calc) (ref <5.0)
Triglycerides: 81 mg/dL (ref <150)

## 2022-08-02 LAB — HEPATITIS C ANTIBODY: Hepatitis C Ab: NONREACTIVE

## 2022-08-02 LAB — HEMOGLOBIN A1C
Hgb A1c MFr Bld: 5.7 % of total Hgb — ABNORMAL HIGH (ref <5.7)
Mean Plasma Glucose: 117 mg/dL
eAG (mmol/L): 6.5 mmol/L

## 2022-08-02 NOTE — Therapy (Signed)
OUTPATIENT PHYSICAL THERAPY CERVICAL TREATMENT    Patient Name: Clarence Skinner MRN: [id number] DOB:April 18, 1961, 62 y.o., male Today's Date: 08/02/2022  END OF SESSION:  PT End of Session - 08/02/22 1330     Visit Number 9    Number of Visits 16    Date for PT Re-Evaluation 08/14/22    Authorization Type Medicaid    Authorization - Visit Number 9    Authorization - Number of Visits 12    Progress Note Due on Visit 10    PT Start Time 1147    PT Stop Time 1225    PT Time Calculation (min) 38 min    Activity Tolerance Patient tolerated treatment well    Behavior During Therapy WFL for tasks assessed/performed                Past Medical History:  Diagnosis Date   COPD (chronic obstructive pulmonary disease) ([place])    ED (erectile dysfunction)    GERD (gastroesophageal reflux disease)    Gout    Hypertension    Multilevel degenerative disc disease    Pneumonia 11/29/2017   Prostate cancer ([place])    Sleep apnea    CPAP   Tachycardia    Wears dentures    partial upper and lower   Past Surgical History:  Procedure Laterality Date   ABCESS DRAINAGE  2009   tonsil   COLONOSCOPY WITH PROPOFOL N/A 02/11/2018   Procedure: COLONOSCOPY WITH PROPOFOL;  Surgeon: Toledo, Benay Pike, MD;  Location: [facility];  Service: Endoscopy;  Laterality: N/A;   ESOPHAGOGASTRODUODENOSCOPY (EGD) WITH PROPOFOL N/A 02/11/2018   Procedure: ESOPHAGOGASTRODUODENOSCOPY (EGD) WITH PROPOFOL;  Surgeon: Toledo, Benay Pike, MD;  Location: [facility];  Service: Endoscopy;  Laterality: N/A;   IR IMAGING GUIDED PORT INSERTION  04/09/2022   MASS EXCISION Right 08/14/2017   Procedure: EXCISION RIGHT TEMPORAL MASS SUBMENTAL MASS;  Surgeon: Carloyn Manner, MD;  Location: [facility];  Service: ENT;  Laterality: Right;  sleep apnea   RADIOACTIVE SEED IMPLANT N/A 01/26/2019   Procedure: RADIOACTIVE SEED IMPLANT/BRACHYTHERAPY IMPLANT;  Surgeon: Billey Co, MD;  Location: [facility];  Service:  Urology;  Laterality: N/A;   SEPTOPLASTY  2016   [place], [place]   VOLUME STUDY N/A 12/29/2018   Procedure: VOLUME STUDY;  Surgeon: Billey Co, MD;  Location: [facility];  Service: Urology;  Laterality: N/A;   Patient Active Problem List   Diagnosis Date Noted   Feels cold 07/05/2022   Sinusitis, acute 05/30/2022   Mass of soft tissue 05/16/2022   Blurred vision 05/16/2022   Hypotension 05/09/2022   Thrush 05/09/2022   Mucositis 04/11/2022   Sinusitis, chronic 04/04/2022   Encounter for antineoplastic chemotherapy 03/28/2022   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[place]) 03/22/2022   Goals of care, counseling/discussion 03/22/2022   Carotid sinus syndrome 03/22/2022   Neck pain 03/22/2022   Mass of right side of neck    Anemia 02/27/2022   Tonsillar mass 02/27/2022   Overweight (BMI 25.0-29.9) 02/27/2022   Syncope 02/26/2022   Pain in joint of left elbow 01/10/2022   Chronic obstructive pulmonary disease ([place]) 06/17/2020   Prostate cancer ([place]) 03/31/2019   Mass of upper lobe of left lung 12/02/2017   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   Tobacco use disorder 11/06/2017   Cervical spondylosis without myelopathy 08/27/2017   Facet arthritis of cervical region 08/27/2017   Obstructive sleep apnea 03/13/2017   Pulmonary hypertension, unspecified ([place]) 01/02/2017   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   Essential hypertension 12/27/2016   Tachycardia 12/27/2016    PCP: Mechele Claude, FNP   REFERRING PROVIDER: [name] MD    REFERRING DIAG: C09.9 (ICD-10-CM) - 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  Rationale for Evaluation and Treatment: Rehabilitation  ONSET DATE: 02/2022  SUBJECTIVE:                                                                                                                                                                                                          SUBJECTIVE STATEMENT: Pt denies any pain upon arrival to the clinic. Pt reports significant improvement in neck pain since last session and improved ability to look down and read as well as with looking down when doing dishes. Pt has been more diligent with Hep which he relates to helping with neck pain.   PERTINENT HISTORY:  Pt reports neck pain since onset of cancer in August of 2023. Pt has pain primarily on the right side and in the posterior aspect of the neck. Pt previously had pain for at least last 5 years on the left side of his neck but the pain is now primarily on the right side of his neck. Pain is very limiting in his ability to complete daily activities without rest breaks. Pt has history of "degenerative changes in the neck" and cervical lymph node metastasis that impact his neck pain. Pt has history of headaches " all the time".    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 Recommend concurrent chemotherapy with radiation.  OT SCREEN 05/23/22: Patient arrive with reports of having trouble swallowing as well as posterior neck pain.  Neck feeling heavy at times during the day hard time keeping it up.  Patient receiving radiation mostly on the right side of the neck.  Reports increased tightness and pain with motion. Upon asking patient about referral to speech therapy.  Patient reports they called him but did not know they were addressing swallowing issues.  Request if they can contact him again because of swallowing issues. Message sent to SLP to call pt again. Patient report discomfort mostly on posterior and right side of neck. Patient show decreased cervical extension more than flexion  Rotation to the right and left 40 degrees. Lateral flexion to the right 30 into the left 20. Patient was educated in correct posture for sitting and  sleeping.  Patient was propping neck up with 1 or 2 pillows pushing head and cervical flexion.  Educated on correct positioning when sitting up in bed watching TV or in recliner during the day. Patient is a side sleeper mostly. Educated patient on doing a small roll under her neck in supine doing gentle active range of motion in supine for cervical rotation, extension and lateral flexion. Reinforced pain-free gentle motion 6-8 reps 3 times a day. Patient wants to follow-up in 2 weeks - if continued to have issues we will request PT eval and treat order.       OT SCREEN 06/06/22: Patient follow-up today after being seen 2 weeks ago.  Referred to speech therapy.  Patient was evaluated and is followed by speech therapy.  Patient reports pain in his neck is better but still feel it on the posterior neck as well as  well as head gets tired at times. Pt is doing much better with how he is sitting and supine with his posture /positioning of neck. Patient do show increase cervical rotation and lateral flexion compared to 2 weeks ago. Rotation to the left 50, rotation to the right 55 degrees. Lateral flexion to the left 30 into the right 38. Did recommend for patient to maybe follow-up with physical therapy for evaluation and treat for cervical range of motion and decreasing pain.  Patient in agreement. Patient did finish radiation but is scheduled for (per patient )a x-ray tomorrow.  PAIN:  Are you having pain? Yes: NPRS scale: 0/10 Pain location:   Pain description:  Aggravating factors: Relieving factors:   PRECAUTIONS: Other: cancer, MD recommends no heat until follow up pt uncomfortable with heat applied to this area.   WEIGHT BEARING RESTRICTIONS: No  FALLS:  Has patient fallen in last 6 months? No  LIVING ENVIRONMENT: Lives with: lives alone Lives in: House/apartment Stairs: No Has following equipment at home: None  OCCUPATION: retired   PLOF: Independent and has pain limiting  his ability to complete daily activities, used to like to go fishing but has not been able due to neck. Had pain on the left sid eprior to cancer that also got up to 10.   PATIENT GOALS: Improve pain levels.   NEXT MD VISIT:   OBJECTIVE:   DIAGNOSTIC FINDINGS:  Neck soft tissue only MRI cervical region on 02/27/22 IMPRESSION: Constellation of findings most compatible with a roughly 2.4 cm Right Tonsillar Carcinoma (series 12, image 11) with associated right level 2 and level 3 malignant nodal, ex-nodal disease (series 14, image 13). Recommend ENT consultation.  PATIENT SURVEYS:  FOTO 26 risk adjusted goal of 42   COGNITION: Overall cognitive status: Within functional limits for tasks assessed  SENSATION: WFL  POSTURE: rounded shoulders, forward head, and posterior pelvic tilt  PALPATION: TTP on bilateral suboccipital musculature, cervical paraspinals and R > L upper trapezius musculature.    CERVICAL ROM:   Active ROM A/PROM (deg) eval  Flexion 52*  Extension 31  Right lateral flexion 15  Left lateral flexion 11*  Right rotation 39  Left rotation 41  Thoracic rotation left  Limited*  Thoracic rotation right  WNL   (Blank rows = not tested) *pain   UPPER EXTREMITY ROM:  Active ROM Right eval Left eval  Shoulder flexion WNL WNL   Shoulder extension WNL WNL  Shoulder abduction WNL Limited   Shoulder adduction WNL WNL  Shoulder extension    Shoulder internal rotation    Shoulder external rotation  Elbow flexion    Elbow extension    Wrist flexion    Wrist extension    Wrist ulnar deviation    Wrist radial deviation    Wrist pronation    Wrist supination     (Blank rows = not tested)  UPPER EXTREMITY MMT:  MMT Right eval Left eval  Shoulder flexion WNL 3+  Shoulder extension WNL 5*  Shoulder abduction WNL 3+  Shoulder adduction WNL 5*  Shoulder extension WNL 5*  Shoulder internal rotation    Shoulder external rotation    Middle trapezius     Lower trapezius     (Blank rows = not tested) * not measured against gravity   CERVICAL SPECIAL TESTS:  Spurling's test: Negative and Distraction test: Positive  FUNCTIONAL TESTS:  NDI: 33  TODAY'S TREATMENT:                                                                                                                              DATE: 08/02/22  Standing rows with scapular retraction with cable machine  10 x ea with 17.5, 22.5, and 27.5#   Standing shoulder extension with cable machine 2 x 10 ea UE with 7.5#   supine thoracic extensions over foam roller 10 x 5 sec holds   Side-lying thoracic rotation (open book) 10 x 5 sec on ea side   Seated ball roll-outs for improved spinal mobilization, forward/lateral to each side, x5 each direction with 5 sec holds  Supine chin tuck combines with cervical flexion 2 x 12 reps for improved cervical flexor strength and endurance   Standing shoulder extensions 2 x 15 reps with GreenTB  Supine shoulder flexion bilateral with 5# weighted bar for thoracic extension and shoulder flexion ROM 2x 10 reps   Manual:  Supine STM to SCM per pt having some soreness in this area Supine suboccipital release technique to decrease cervicalgia Supine manual traction performed in order to increase joint space in cervical region for pain relief Supine UT/Levator stretch, 45 sec bouts to increase tissue extensibility of the cervical region    PATIENT EDUCATION:  Education details: PHEP Person educated: Patient Education method: Explanation Education comprehension: verbalized understanding  HOME EXERCISE PROGRAM: To begin next session, UT and levator stretch, postural muscle activation activities   ASSESSMENT:  CLINICAL IMPRESSION:  Pt responded well to the exercises given to him today and put forth good effort. Pt required further education regarding proper performance and prescription of therapeutic exercises as he had some pain and discomfort  which was reported from improper performance of exercises. Pt responds well to education of exercises and is continuing to experience improved pain and cervical range of motion. Pt reports improved pain with activities such as looking down and relates to compliance with HEP. Pt will continue to benefit from skilled physical therapy intervention to address impairments, improve QOL, and attain therapy goals.       OBJECTIVE IMPAIRMENTS: decreased activity tolerance, decreased  ROM, hypomobility, impaired perceived functional ability, impaired flexibility, and pain.   ACTIVITY LIMITATIONS: carrying, sitting, and standing  PARTICIPATION LIMITATIONS: meal prep, cleaning, laundry, driving, and community activity  PERSONAL FACTORS: 3+ comorbidities: DDD, HTN, COPD  are also affecting patient's functional outcome.   REHAB POTENTIAL: Fair chronicity of left sided neck pain (>5 years of significantly limiting pain) and ongoing uncertainty with cancer progression  CLINICAL DECISION MAKING: Evolving/moderate complexity  EVALUATION COMPLEXITY: Moderate   GOALS: Goals reviewed with patient? Yes  SHORT TERM GOALS: Target date: 07/17/2022    Patient will be independent with home exercise program in order to improve neck pain and range of motion Baseline: No Home exercise program at this time Goal status: INITIAL   LONG TERM GOALS: Target date: 08/14/2022    Patient will improve cervical range of motion by 15 degrees with lateral flexion by 10 degrees with cervical rotation in order to improve patient's function Baseline: See evaluation table Goal status: INITIAL  2.  Patient will improve focus on therapeutic outcomes survey to 42 or greater to indicate improved subjective rating of neck function and pain. Baseline: 26 Goal status: INITIAL  3.  Patient will improve neck disability index score by 10 points or greater in order to indicate statistically significant improvement in neck related  functional capacity. Baseline: 33 Goal status: INITIAL  4.  Patient will improve right shoulder flexion and abduction strength to 4 out of 5 or greater in order to indicate improved strength on the right side and improved lead to complete functional activities with the right upper extremity without pain or limitation Baseline: See evaluation chart Goal status: INITIAL     PLAN:  PT FREQUENCY: 2x/week  PT DURATION: 8 weeks  PLANNED INTERVENTIONS: Therapeutic exercises, Therapeutic activity, Neuromuscular re-education, Balance training, Gait training, Patient/Family education, Self Care, Joint mobilization, Joint manipulation, Spinal mobilization, and Manual therapy  PLAN FOR NEXT SESSION:  Progress home exercise program if appropriate, manual therapy for improvement in pain, continue POC for cervical and thoracic mobility and cervical stability, interventions for cervical flexion pain improvement   Particia Lather PT   08/02/22, 1:30 PM

## 2022-08-03 ENCOUNTER — Telehealth: Payer: Self-pay | Admitting: Internal Medicine

## 2022-08-03 NOTE — Telephone Encounter (Signed)
Pt. Given lab results, verbalizes understanding. 

## 2022-08-07 ENCOUNTER — Ambulatory Visit: Payer: Medicaid Other | Admitting: Physical Therapy

## 2022-08-07 DIAGNOSIS — Z7689 Persons encountering health services in other specified circumstances: Secondary | ICD-10-CM | POA: Diagnosis not present

## 2022-08-07 DIAGNOSIS — G8929 Other chronic pain: Secondary | ICD-10-CM

## 2022-08-07 DIAGNOSIS — M542 Cervicalgia: Secondary | ICD-10-CM

## 2022-08-07 NOTE — Therapy (Signed)
[place] [facility] [address] [place], [place], [address] Phone: [phone]   Fax:  [phone]  Patient Details  Name: Clarence Skinner MRN: [id number] Date of Birth: [birth year] Referring Provider:  [name], MD  Encounter Date: 08/07/2022  Patient is a physical therapy clinic and reports on his way here he received a call from his doctor regarding instructions prior to tomorrow's PET scan from his skull base to his thigh.  Patient's doctor instructed him to not complete any physical activity on the day prior including physical therapy.  Physical therapy is therefore contraindicated this date and no charge visit number is associated with this brief encounter.  Patient was present in clinic at this time as he reports he was already on his way when he received the call.  Patient will continue with physical therapy following his PET scan.  Particia Lather, PT 08/07/2022, 10:34 AM  [place] [facility] [address] [place], [place], [address] Phone: [phone]   Fax:  [phone]

## 2022-08-07 NOTE — Therapy (Deleted)
OUTPATIENT PHYSICAL THERAPY CERVICAL TREATMENT / Physical Therapy Progress Note   Dates of reporting period  ***   to   ***    Patient Name: Clarence Skinner MRN: [id number] DOB:16-Feb-1961, 62 y.o., male Today's Date: 08/07/2022  END OF SESSION:       Past Medical History:  Diagnosis Date   COPD (chronic obstructive pulmonary disease) ([place])    ED (erectile dysfunction)    GERD (gastroesophageal reflux disease)    Gout    Hypertension    Multilevel degenerative disc disease    Pneumonia 11/29/2017   Prostate cancer ([place])    Sleep apnea    CPAP   Tachycardia    Wears dentures    partial upper and lower   Past Surgical History:  Procedure Laterality Date   ABCESS DRAINAGE  2009   tonsil   COLONOSCOPY WITH PROPOFOL N/A 02/11/2018   Procedure: COLONOSCOPY WITH PROPOFOL;  Surgeon: Toledo, Benay Pike, MD;  Location: [facility];  Service: Endoscopy;  Laterality: N/A;   ESOPHAGOGASTRODUODENOSCOPY (EGD) WITH PROPOFOL N/A 02/11/2018   Procedure: ESOPHAGOGASTRODUODENOSCOPY (EGD) WITH PROPOFOL;  Surgeon: Toledo, Benay Pike, MD;  Location: [facility];  Service: Endoscopy;  Laterality: N/A;   IR IMAGING GUIDED PORT INSERTION  04/09/2022   MASS EXCISION Right 08/14/2017   Procedure: EXCISION RIGHT TEMPORAL MASS SUBMENTAL MASS;  Surgeon: Carloyn Manner, MD;  Location: [place];  Service: ENT;  Laterality: Right;  sleep apnea   RADIOACTIVE SEED IMPLANT N/A 01/26/2019   Procedure: RADIOACTIVE SEED IMPLANT/BRACHYTHERAPY IMPLANT;  Surgeon: Billey Co, MD;  Location: [facility];  Service: Urology;  Laterality: N/A;   SEPTOPLASTY  2016   [place], [place]   VOLUME STUDY N/A 12/29/2018   Procedure: VOLUME STUDY;  Surgeon: Billey Co, MD;  Location: [facility];  Service: Urology;  Laterality: N/A;   Patient Active Problem List   Diagnosis Date Noted   Feels cold 07/05/2022   Sinusitis, acute 05/30/2022   Mass of soft tissue 05/16/2022   Blurred vision 05/16/2022    Hypotension 05/09/2022   Thrush 05/09/2022   Mucositis 04/11/2022   Sinusitis, chronic 04/04/2022   Encounter for antineoplastic chemotherapy 03/28/2022   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[place]) 03/22/2022   Goals of care, counseling/discussion 03/22/2022   Carotid sinus syndrome 03/22/2022   Neck pain 03/22/2022   Mass of right side of neck    Anemia 02/27/2022   Tonsillar mass 02/27/2022   Overweight (BMI 25.0-29.9) 02/27/2022   Syncope 02/26/2022   Pain in joint of left elbow 01/10/2022   Chronic obstructive pulmonary disease ([place]) 06/17/2020   Prostate cancer ([place]) 03/31/2019   Mass of upper lobe of left lung 12/02/2017   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   Tobacco use disorder 11/06/2017   Cervical spondylosis without myelopathy 08/27/2017   Facet arthritis of cervical region 08/27/2017   Obstructive sleep apnea 03/13/2017   Pulmonary hypertension, unspecified ([place]) 01/02/2017   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   Essential hypertension 12/27/2016   Tachycardia 12/27/2016    PCP: Mechele Claude, FNP   REFERRING PROVIDER: [name] MD    REFERRING DIAG: C09.9 (ICD-10-CM) - 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 Pt reports significant improvement in neck pain since last session and improved ability to look down and read as well as with looking down when doing dishes. Pt has been more diligent with Hep which he relates to helping with neck pain.   PERTINENT HISTORY:   Pt reports neck pain since onset of cancer in August of 2023. Pt has pain primarily on the right side and in the posterior aspect of the neck. Pt previously had pain for at least last 5 years on the left side of his neck but the pain is now primarily on the right side of his neck. Pain is very limiting in his ability to complete daily activities without rest breaks. Pt has history of "degenerative changes in the neck" and cervical lymph node metastasis that impact his neck pain. Pt has history of headaches " all the time".    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 Recommend concurrent chemotherapy with radiation.  OT SCREEN 05/23/22: Patient arrive with reports of having trouble swallowing as well as posterior neck pain.  Neck feeling heavy at times during the day hard time keeping it up.  Patient receiving radiation mostly on the right side of the neck.  Reports increased tightness and pain with motion. Upon asking patient about referral to speech therapy.  Patient reports they called him but did not know they were addressing swallowing issues.  Request if they can contact him again because of swallowing issues. Message sent to SLP to call pt again. Patient report discomfort mostly on posterior and right side of neck. Patient show decreased cervical extension more than flexion  Rotation to the right and left 40 degrees. Lateral flexion to the right 30 into the left 20. Patient was educated in correct posture for sitting and sleeping.  Patient was propping neck up with 1 or 2 pillows pushing head and cervical flexion.  Educated on correct positioning when sitting up in bed watching TV or in recliner during the day. Patient is a side sleeper mostly. Educated patient on doing a small roll under her neck in supine doing gentle active range of motion in supine for cervical  rotation, extension and lateral flexion. Reinforced pain-free gentle motion 6-8 reps 3 times a day. Patient wants to follow-up in 2 weeks - if continued to have issues we will request PT eval and treat order.       OT SCREEN 06/06/22: Patient follow-up today after being seen 2 weeks ago.  Referred to speech therapy.  Patient was evaluated and is followed by speech therapy.  Patient reports pain in his neck is better but still feel it on the posterior neck as well as  well as head gets tired at times. Pt is doing much better with how he is sitting and supine with his posture /positioning of neck. Patient do show increase cervical rotation and lateral flexion compared to 2 weeks ago. Rotation to the left 50, rotation to the right 55 degrees. Lateral flexion to the left 30 into the right 38. Did recommend for patient to maybe follow-up with physical therapy for evaluation and treat for cervical range of motion and decreasing pain.  Patient in agreement. Patient did finish radiation but is scheduled for (per patient )a x-ray tomorrow.  PAIN:  Are you having pain? Yes: NPRS scale: 0/10  Pain location:   Pain description:  Aggravating factors: Relieving factors:   PRECAUTIONS: Other: cancer, MD recommends no heat until follow up pt uncomfortable with heat applied to this area.   WEIGHT BEARING RESTRICTIONS: No  FALLS:  Has patient fallen in last 6 months? No  LIVING ENVIRONMENT: Lives with: lives alone Lives in: House/apartment Stairs: No Has following equipment at home: None  OCCUPATION: retired   PLOF: Independent and has pain limiting his ability to complete daily activities, used to like to go fishing but has not been able due to neck. Had pain on the left sid eprior to cancer that also got up to 10.   PATIENT GOALS: Improve pain levels.   NEXT MD VISIT:   OBJECTIVE:   DIAGNOSTIC FINDINGS:  Neck soft tissue only MRI cervical region on 02/27/22 IMPRESSION: Constellation of  findings most compatible with a roughly 2.4 cm Right Tonsillar Carcinoma (series 12, image 11) with associated right level 2 and level 3 malignant nodal, ex-nodal disease (series 14, image 13). Recommend ENT consultation.  PATIENT SURVEYS:  FOTO 26 risk adjusted goal of 42   COGNITION: Overall cognitive status: Within functional limits for tasks assessed  SENSATION: WFL  POSTURE: rounded shoulders, forward head, and posterior pelvic tilt  PALPATION: TTP on bilateral suboccipital musculature, cervical paraspinals and R > L upper trapezius musculature.    CERVICAL ROM:   Active ROM A/PROM (deg) eval  Flexion 52*  Extension 31  Right lateral flexion 15  Left lateral flexion 11*  Right rotation 39  Left rotation 41  Thoracic rotation left  Limited*  Thoracic rotation right  WNL   (Blank rows = not tested) *pain   UPPER EXTREMITY ROM:  Active ROM Right eval Left eval  Shoulder flexion WNL WNL   Shoulder extension WNL WNL  Shoulder abduction WNL Limited   Shoulder adduction WNL WNL  Shoulder extension    Shoulder internal rotation    Shoulder external rotation    Elbow flexion    Elbow extension    Wrist flexion    Wrist extension    Wrist ulnar deviation    Wrist radial deviation    Wrist pronation    Wrist supination     (Blank rows = not tested)  UPPER EXTREMITY MMT:  MMT Right eval Left eval  Shoulder flexion WNL 3+  Shoulder extension WNL 5*  Shoulder abduction WNL 3+  Shoulder adduction WNL 5*  Shoulder extension WNL 5*  Shoulder internal rotation    Shoulder external rotation    Middle trapezius    Lower trapezius     (Blank rows = not tested) * not measured against gravity   CERVICAL SPECIAL TESTS:  Spurling's test: Negative and Distraction test: Positive  FUNCTIONAL TESTS:  NDI: 33  TODAY'S TREATMENT:  DATE:  08/07/22  Standing rows with scapular retraction with cable machine  10 x ea with 17.5, 22.5, and 27.5#   Standing shoulder extension with cable machine 2 x 10 ea UE with 7.5#   supine thoracic extensions over foam roller 10 x 5 sec holds   Side-lying thoracic rotation (open book) 10 x 5 sec on ea side   Seated ball roll-outs for improved spinal mobilization, forward/lateral to each side, x5 each direction with 5 sec holds  Supine chin tuck combines with cervical flexion 2 x 12 reps for improved cervical flexor strength and endurance   Standing shoulder extensions 2 x 15 reps with GreenTB  Supine shoulder flexion bilateral with 5# weighted bar for thoracic extension and shoulder flexion ROM 2x 10 reps   Manual:  Supine STM to SCM per pt having some soreness in this area Supine suboccipital release technique to decrease cervicalgia Supine manual traction performed in order to increase joint space in cervical region for pain relief Supine UT/Levator stretch, 45 sec bouts to increase tissue extensibility of the cervical region    PATIENT EDUCATION:  Education details: PHEP Person educated: Patient Education method: Explanation Education comprehension: verbalized understanding  HOME EXERCISE PROGRAM: To begin next session, UT and levator stretch, postural muscle activation activities   ASSESSMENT:  CLINICAL IMPRESSION:  Pt responded well to the exercises given to him today and put forth good effort. Pt required further education regarding proper performance and prescription of therapeutic exercises as he had some pain and discomfort which was reported from improper performance of exercises. Pt responds well to education of exercises and is continuing to experience improved pain and cervical range of motion. Pt reports improved pain with activities such as looking down and relates to compliance with HEP. Pt will continue to benefit from skilled physical therapy intervention to  address impairments, improve QOL, and attain therapy goals.       OBJECTIVE IMPAIRMENTS: decreased activity tolerance, decreased ROM, hypomobility, impaired perceived functional ability, impaired flexibility, and pain.   ACTIVITY LIMITATIONS: carrying, sitting, and standing  PARTICIPATION LIMITATIONS: meal prep, cleaning, laundry, driving, and community activity  PERSONAL FACTORS: 3+ comorbidities: DDD, HTN, COPD  are also affecting patient's functional outcome.   REHAB POTENTIAL: Fair chronicity of left sided neck pain (>5 years of significantly limiting pain) and ongoing uncertainty with cancer progression  CLINICAL DECISION MAKING: Evolving/moderate complexity  EVALUATION COMPLEXITY: Moderate   GOALS: Goals reviewed with patient? Yes  SHORT TERM GOALS: Target date: 07/17/2022    Patient will be independent with home exercise program in order to improve neck pain and range of motion Baseline: No Home exercise program at this time Goal status: INITIAL   LONG TERM GOALS: Target date: 08/14/2022    Patient will improve cervical range of motion by 15 degrees with lateral flexion by 10 degrees with cervical rotation in order to improve patient's function Baseline: See evaluation table Goal status: INITIAL  2.  Patient will improve focus on therapeutic outcomes survey to 42 or greater to indicate improved subjective rating of neck function and pain. Baseline: 26 Goal status: INITIAL  3.  Patient will improve neck disability index score by 10 points or greater in order to indicate statistically significant improvement in neck related functional capacity. Baseline: 33 Goal status: INITIAL  4.  Patient will improve right shoulder flexion and abduction strength to 4 out of 5 or greater in order to indicate improved strength on the right side and improved lead to complete functional  activities with the right upper extremity without pain or limitation Baseline: See evaluation  chart Goal status: INITIAL     PLAN:  PT FREQUENCY: 2x/week  PT DURATION: 8 weeks  PLANNED INTERVENTIONS: Therapeutic exercises, Therapeutic activity, Neuromuscular re-education, Balance training, Gait training, Patient/Family education, Self Care, Joint mobilization, Joint manipulation, Spinal mobilization, and Manual therapy  PLAN FOR NEXT SESSION:  Progress home exercise program if appropriate, manual therapy for improvement in pain, continue POC for cervical and thoracic mobility and cervical stability, interventions for cervical flexion pain improvement   Particia Lather PT   08/07/22, 8:44 AM

## 2022-08-09 ENCOUNTER — Ambulatory Visit: Payer: Medicaid Other | Admitting: Physical Therapy

## 2022-08-13 ENCOUNTER — Other Ambulatory Visit: Payer: Medicaid Other

## 2022-08-13 ENCOUNTER — Ambulatory Visit: Payer: Medicaid Other | Admitting: Oncology

## 2022-08-14 ENCOUNTER — Ambulatory Visit: Payer: Medicaid Other | Admitting: Physical Therapy

## 2022-08-14 ENCOUNTER — Encounter: Payer: Self-pay | Admitting: Physical Therapy

## 2022-08-14 DIAGNOSIS — G8929 Other chronic pain: Secondary | ICD-10-CM | POA: Diagnosis not present

## 2022-08-14 DIAGNOSIS — M542 Cervicalgia: Secondary | ICD-10-CM | POA: Diagnosis not present

## 2022-08-14 DIAGNOSIS — Z7689 Persons encountering health services in other specified circumstances: Secondary | ICD-10-CM | POA: Diagnosis not present

## 2022-08-14 NOTE — Therapy (Signed)
OUTPATIENT PHYSICAL THERAPY CERVICAL TREATMENT / Physical Therapy Progress Note/ Discharge Summary    Dates of reporting period  06/19/22   to   08/14/22    Patient Name: Clarence Skinner MRN: [id number] DOB:[birth year], 62 y.o., male Today's Date: 08/14/2022  END OF SESSION:  PT End of Session - 08/14/22 0913     Visit Number 10    Number of Visits 16    Date for PT Re-Evaluation 08/14/22    Authorization Type Medicaid    Authorization - Visit Number 10    Authorization - Number of Visits 12    Progress Note Due on Visit 10    PT Start Time 0847    PT Stop Time 0903    PT Time Calculation (min) 16 min    Activity Tolerance No increased pain;Patient tolerated treatment well   Pt unable to participate in exercie interventions secondary to MD restrictions prior to PET scan scheduled for tomorrow.   Behavior During Therapy [facility] for tasks assessed/performed                 Past Medical History:  Diagnosis Date   COPD (chronic obstructive pulmonary disease) ([place])    ED (erectile dysfunction)    GERD (gastroesophageal reflux disease)    Gout    Hypertension    Multilevel degenerative disc disease    Pneumonia 11/29/2017   Prostate cancer ([place])    Sleep apnea    CPAP   Tachycardia    Wears dentures    partial upper and lower   Past Surgical History:  Procedure Laterality Date   ABCESS DRAINAGE  2009   tonsil   COLONOSCOPY WITH PROPOFOL N/A 02/11/2018   Procedure: COLONOSCOPY WITH PROPOFOL;  Surgeon: Toledo, Benay Pike, MD;  Location: [facility] END[facility];  Service: Endoscopy;  Laterality: N/A;   ESOPHAGOGASTRODUODENOSCOPY (EGD) WITH PROPOFOL N/A 02/11/2018   Procedure: ESOPHAGOGASTRODUODENOSCOPY (EGD) WITH PROPOFOL;  Surgeon: Toledo, Benay Pike, MD;  Location: [facility] ENDOS[facility];  Service: Endoscopy;  Laterality: N/A;   IR IMAGING GUIDED PORT INSERTION  04/09/2022   MASS EXCISION Right 08/14/2017   Procedure: EXCISION RIGHT TEMPORAL MASS SUBMENTAL MASS;  Surgeon: Carloyn Manner,  MD;  Location: [place];  Service: ENT;  Laterality: Right;  sleep apnea   RADIOACTIVE SEED IMPLANT N/A 01/26/2019   Procedure: RADIOACTIVE SEED IMPLANT/BRACHYTHERAPY IMPLANT;  Surgeon: Billey Co, MD;  Location: [facility] OR[facility];  Service: Urology;  Laterality: N/A;   SEPTOPLASTY  2016   [place], [place]   VOLUME STUDY N/A 12/29/2018   Procedure: VOLUME STUDY;  Surgeon: Billey Co, MD;  Location: [facility] OR[facility];  Service: Urology;  Laterality: N/A;   Patient Active Problem List   Diagnosis Date Noted   Feels cold 07/05/2022   Sinusitis, acute 05/30/2022   Mass of soft tissue 05/16/2022   Blurred vision 05/16/2022   Hypotension 05/09/2022   Thrush 05/09/2022   Mucositis 04/11/2022   Sinusitis, chronic 04/04/2022   Encounter for antineoplastic chemotherapy 03/28/2022   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[place]) 03/22/2022   Goals of care, counseling/discussion 03/22/2022   Carotid sinus syndrome 03/22/2022   Neck pain 03/22/2022   Mass of right side of neck    Anemia 02/27/2022   Tonsillar mass 02/27/2022   Overweight (BMI 25.0-29.9) 02/27/2022   Syncope 02/26/2022   Pain in joint of left elbow 01/10/2022   Chronic obstructive pulmonary disease ([place]) 06/17/2020   Prostate cancer ([place]) 03/31/2019   Mass of upper lobe of left lung 12/02/2017   Impotence  of organic origin 11/06/2017   Tobacco use disorder 11/06/2017   Cervical spondylosis without myelopathy 08/27/2017   Facet arthritis of cervical region 08/27/2017   Obstructive sleep apnea 03/13/2017   Pulmonary hypertension, unspecified ([place]) 01/02/2017   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   Essential hypertension 12/27/2016   Tachycardia 12/27/2016    PCP: Mechele Claude, FNP   REFERRING PROVIDER: [name] MD    REFERRING DIAG: C09.9 (ICD-10-CM) - 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 Pt reports  he is comfortable with HEP at this time and is ready for discharge if PT deems he is fit.   PERTINENT HISTORY:  Pt reports neck pain since onset of cancer in August of 2023. Pt has pain primarily on the right side and in the posterior aspect of the neck. Pt previously had pain for at least last 5 years on the left side of his neck but the pain is now primarily on the right side of his neck. Pain is very limiting in his ability to complete daily activities without rest breaks. Pt has history of "degenerative changes in the neck" and cervical lymph node metastasis that impact his neck pain. Pt has history of headaches " all the time".    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 Recommend concurrent chemotherapy with radiation.  OT SCREEN 05/23/22: Patient arrive with reports of having trouble swallowing as well as posterior neck pain.  Neck feeling heavy at times during the day hard time keeping it up.  Patient receiving radiation mostly on the right side of the neck.  Reports increased tightness and pain with motion. Upon asking patient about referral to speech therapy.  Patient reports they called him but did not know they were addressing swallowing issues.  Request if they can contact him again because of swallowing issues. Message sent to SLP to call pt again. Patient report discomfort mostly on posterior and right side of neck. Patient show  decreased cervical extension more  than flexion  Rotation to the right and left 40 degrees. Lateral flexion to the right 30 into the left 20. Patient was educated in correct posture for sitting and sleeping.  Patient was propping neck up with 1 or 2 pillows pushing head and cervical flexion.  Educated on correct positioning when sitting up in bed watching TV or in recliner during the day. Patient is a side sleeper mostly. Educated patient on doing a small roll under her neck in supine doing gentle active range of motion in supine for cervical rotation, extension and lateral flexion. Reinforced pain-free gentle motion 6-8 reps 3 times a day. Patient wants to follow-up in 2 weeks - if continued to have issues we will request PT eval and treat order.       OT SCREEN 06/06/22: Patient follow-up today after being seen 2 weeks ago.  Referred to speech therapy.  Patient was evaluated and is followed by speech therapy.  Patient reports pain in his neck is better but still feel it on the posterior neck as well as  well as head gets tired at times. Pt is doing much better with how he is sitting and supine with his posture /positioning of neck. Patient do show increase cervical rotation and lateral flexion compared to 2 weeks ago. Rotation to the left 50, rotation to the right 55 degrees. Lateral flexion to the left 30 into the right 38. Did recommend for patient to maybe follow-up with physical therapy for evaluation and treat for cervical range of motion and decreasing pain.  Patient in agreement. Patient did finish radiation but is scheduled for (per patient )a x-ray tomorrow.  PAIN:  Are you having pain? Yes: NPRS scale: 0/10 Pain location:   Pain description:  Aggravating factors: Relieving factors:   PRECAUTIONS: Other: cancer, MD recommends no heat until follow up pt uncomfortable with heat applied to this area.   WEIGHT BEARING RESTRICTIONS: No  FALLS:  Has patient fallen in last 6 months? No  LIVING ENVIRONMENT: Lives  with: lives alone Lives in: House/apartment Stairs: No Has following equipment at home: None  OCCUPATION: retired   PLOF: Independent and has pain limiting his ability to complete daily activities, used to like to go fishing but has not been able due to neck. Had pain on the left sid eprior to cancer that also got up to 10.   PATIENT GOALS: Improve pain levels.   NEXT MD VISIT:   OBJECTIVE:   DIAGNOSTIC FINDINGS:  Neck soft tissue only MRI cervical region on 02/27/22 IMPRESSION: Constellation of findings most compatible with a roughly 2.4 cm Right Tonsillar Carcinoma (series 12, image 11) with associated right level 2 and level 3 malignant nodal, ex-nodal disease (series 14, image 13). Recommend ENT consultation.  PATIENT SURVEYS:  FOTO 26 risk adjusted goal of 42   COGNITION: Overall cognitive status: Within functional limits for tasks assessed  SENSATION: WFL  POSTURE: rounded shoulders, forward head, and posterior pelvic tilt  PALPATION: TTP on bilateral suboccipital musculature, cervical paraspinals and R > L upper trapezius musculature.    CERVICAL ROM:   Active ROM A/PROM (deg) eval AROM 08/14/22  Flexion 52* 55  Extension 31 20  Right lateral flexion 15 30  Left lateral flexion 11* 30  Right rotation 39 45  Left rotation 41 48  Thoracic rotation left  Limited*   Thoracic rotation right  WNL    (Blank rows = not tested) *pain   UPPER EXTREMITY ROM:  Active ROM Right eval Left eval  Shoulder flexion WNL WNL   Shoulder extension WNL WNL  Shoulder abduction WNL Limited   Shoulder adduction WNL WNL  Shoulder extension    Shoulder internal rotation    Shoulder external rotation    Elbow flexion    Elbow extension    Wrist flexion    Wrist extension    Wrist ulnar deviation    Wrist radial deviation    Wrist pronation    Wrist supination     (Blank rows = not tested)  UPPER EXTREMITY MMT:  MMT Right eval Left eval 08/14/22  Shoulder flexion  WNL 3+ 4+  Shoulder extension WNL 5* 4+  Shoulder abduction WNL 3+ 4+  Shoulder adduction WNL 5*   Shoulder extension WNL 5*   Shoulder internal rotation     Shoulder external rotation     Middle trapezius     Lower trapezius      (Blank rows = not tested) * not measured against gravity   CERVICAL SPECIAL TESTS:  Spurling's test: Negative and Distraction test: Positive  FUNCTIONAL TESTS:  NDI: 33  TODAY'S TREATMENT:                                                                                                                              DATE: 08/14/22 Assessment of goals only per pt preference, he is happy with plan and HEP and I happy with discharge.   Physical therapy treatment session today consisted of completing assessment of goals and administration of testing as demonstrated and documented in flow sheet, treatment, and goals section of this note. Addition treatments may be found below.    PATIENT EDUCATION:  Education details: PHEP Person educated: Patient Education method: Explanation Education comprehension: verbalized understanding  HOME EXERCISE PROGRAM: To begin next session, UT and levator stretch, postural muscle activation activities   ASSESSMENT:  CLINICAL IMPRESSION:  Patient presents to physical therapy for progress note this date.  Patient has met or nearly met all goals and is no longer having increased pain with every day activities.  Patient happy with current home exercise program as well as with his progress in physical therapy thus far.  Patient will be discharged with home exercise program this date with all questions answered.  See goals section as well as assessment tables for progression in strength and function.      OBJECTIVE IMPAIRMENTS: decreased activity tolerance, decreased ROM, hypomobility, impaired perceived functional ability, impaired flexibility, and pain.   ACTIVITY LIMITATIONS: carrying, sitting, and standing  PARTICIPATION  LIMITATIONS: meal prep, cleaning, laundry, driving, and community activity  PERSONAL FACTORS: 3+ comorbidities: DDD, HTN, COPD  are also affecting patient's functional outcome.   REHAB POTENTIAL: Fair chronicity of left sided neck pain (>5 years of significantly limiting pain) and ongoing uncertainty with cancer progression  CLINICAL DECISION MAKING: Evolving/moderate complexity  EVALUATION COMPLEXITY: Moderate   GOALS: Goals reviewed with patient? Yes  SHORT TERM  GOALS: Target date: 07/17/2022    Patient will be independent with home exercise program in order to improve neck pain and range of motion Baseline: No Home exercise program at this time Goal status: MET   LONG TERM GOALS: Target date: 08/14/2022    Patient will improve cervical range of motion by 15 degrees with lateral flexion by 10 degrees with cervical rotation in order to improve patient's function Baseline: See evaluation table 1/23: partially met; met lateral flexion and improved cervical rotation no pain with any movements  Goal status: IN PROGRESS  2.  Patient will improve focus on therapeutic outcomes survey to 42 or greater to indicate improved subjective rating of neck function and pain. Baseline: 26 1/23:50 Goal status: MET  3.  Patient will improve neck disability index score by 10 points or greater in order to indicate statistically significant improvement in neck related functional capacity. Baseline: 33 1/23: 19 Goal status: MET  4.  Patient will improve right shoulder flexion and abduction strength to 4 out of 5 or greater in order to indicate improved strength on the right side and improved lead to complete functional activities with the right upper extremity without pain or limitation Baseline: See evaluation chart 1/23: met 4+/5 in both without pain  Goal status: MET     PLAN:  PT FREQUENCY: 2x/week  PT DURATION: 8 weeks  PLANNED INTERVENTIONS: Therapeutic exercises, Therapeutic  activity, Neuromuscular re-education, Balance training, Gait training, Patient/Family education, Self Care, Joint mobilization, Joint manipulation, Spinal mobilization, and Manual therapy  PLAN FOR NEXT SESSION:  Progress home exercise program if appropriate, manual therapy for improvement in pain, continue POC for cervical and thoracic mobility and cervical stability, interventions for cervical flexion pain improvement   Particia Lather PT   08/14/22, 9:14 AM

## 2022-08-16 ENCOUNTER — Ambulatory Visit: Payer: Medicaid Other | Admitting: Physical Therapy

## 2022-08-21 ENCOUNTER — Ambulatory Visit: Payer: Medicaid Other

## 2022-08-22 ENCOUNTER — Ambulatory Visit
Admission: RE | Admit: 2022-08-22 | Discharge: 2022-08-22 | Disposition: A | Discharge status: Home / Self Care | Payer: Medicaid Other | Source: Ambulatory Visit | Attending: Oncology | Admitting: Oncology

## 2022-08-22 DIAGNOSIS — C099 Malignant neoplasm of tonsil, unspecified: Secondary | ICD-10-CM | POA: Diagnosis not present

## 2022-08-22 DIAGNOSIS — R599 Enlarged lymph nodes, unspecified: Secondary | ICD-10-CM | POA: Insufficient documentation

## 2022-08-22 DIAGNOSIS — I700 Atherosclerosis of aorta: Secondary | ICD-10-CM | POA: Diagnosis not present

## 2022-08-22 DIAGNOSIS — I2510 Atherosclerotic heart disease of native coronary artery without angina pectoris: Secondary | ICD-10-CM | POA: Insufficient documentation

## 2022-08-22 DIAGNOSIS — R918 Other nonspecific abnormal finding of lung field: Secondary | ICD-10-CM

## 2022-08-22 DIAGNOSIS — J439 Emphysema, unspecified: Secondary | ICD-10-CM | POA: Insufficient documentation

## 2022-08-22 DIAGNOSIS — J432 Centrilobular emphysema: Secondary | ICD-10-CM | POA: Diagnosis not present

## 2022-08-22 LAB — GLUCOSE, CAPILLARY: Glucose-Capillary: 88 mg/dL (ref 70–99)

## 2022-08-22 MED ORDER — FLUDEOXYGLUCOSE F - 18 (FDG) INJECTION
8.9000 | Freq: Once | INTRAVENOUS | Status: AC | PRN
  Administered 2022-08-22: 9.25 via INTRAVENOUS

## 2022-08-23 DIAGNOSIS — Z419 Encounter for procedure for purposes other than remedying health state, unspecified: Secondary | ICD-10-CM | POA: Diagnosis not present

## 2022-08-24 DIAGNOSIS — Z85818 Personal history of malignant neoplasm of other sites of lip, oral cavity, and pharynx: Secondary | ICD-10-CM | POA: Diagnosis not present

## 2022-08-24 DIAGNOSIS — J320 Chronic maxillary sinusitis: Secondary | ICD-10-CM | POA: Diagnosis not present

## 2022-08-26 ENCOUNTER — Other Ambulatory Visit: Payer: Self-pay | Admitting: Internal Medicine

## 2022-08-27 DIAGNOSIS — Z7689 Persons encountering health services in other specified circumstances: Secondary | ICD-10-CM | POA: Diagnosis not present

## 2022-08-28 ENCOUNTER — Encounter: Payer: Self-pay | Admitting: Oncology

## 2022-08-28 ENCOUNTER — Inpatient Hospital Stay (HOSPITAL_BASED_OUTPATIENT_CLINIC_OR_DEPARTMENT_OTHER): Payer: Medicaid Other | Admitting: Oncology

## 2022-08-28 ENCOUNTER — Inpatient Hospital Stay: Payer: Medicaid Other | Attending: Oncology

## 2022-08-28 ENCOUNTER — Encounter: Payer: Medicaid Other | Admitting: Physical Therapy

## 2022-08-28 VITALS — BP 120/76 | HR 95 | Temp 97.0°F | Resp 18 | Wt 173.7 lb

## 2022-08-28 DIAGNOSIS — Z95828 Presence of other vascular implants and grafts: Secondary | ICD-10-CM

## 2022-08-28 DIAGNOSIS — C099 Malignant neoplasm of tonsil, unspecified: Secondary | ICD-10-CM | POA: Diagnosis not present

## 2022-08-28 DIAGNOSIS — C770 Secondary and unspecified malignant neoplasm of lymph nodes of head, face and neck: Secondary | ICD-10-CM | POA: Insufficient documentation

## 2022-08-28 DIAGNOSIS — N179 Acute kidney failure, unspecified: Secondary | ICD-10-CM | POA: Insufficient documentation

## 2022-08-28 DIAGNOSIS — D649 Anemia, unspecified: Secondary | ICD-10-CM | POA: Diagnosis not present

## 2022-08-28 DIAGNOSIS — I10 Essential (primary) hypertension: Secondary | ICD-10-CM | POA: Diagnosis not present

## 2022-08-28 DIAGNOSIS — F17210 Nicotine dependence, cigarettes, uncomplicated: Secondary | ICD-10-CM | POA: Insufficient documentation

## 2022-08-28 DIAGNOSIS — Z8042 Family history of malignant neoplasm of prostate: Secondary | ICD-10-CM | POA: Insufficient documentation

## 2022-08-28 DIAGNOSIS — Z7689 Persons encountering health services in other specified circumstances: Secondary | ICD-10-CM | POA: Diagnosis not present

## 2022-08-28 LAB — COMPREHENSIVE METABOLIC PANEL
ALT: 10 U/L (ref 0–44)
AST: 12 U/L — ABNORMAL LOW (ref 15–41)
Albumin: 3.7 g/dL (ref 3.5–5.0)
Alkaline Phosphatase: 70 U/L (ref 38–126)
Anion gap: 10 (ref 5–15)
BUN: 19 mg/dL (ref 6–20)
CO2: 23 mmol/L (ref 22–32)
Calcium: 8.5 mg/dL — ABNORMAL LOW (ref 8.9–10.3)
Chloride: 105 mmol/L (ref 98–111)
Creatinine, Ser: 1.56 mg/dL — ABNORMAL HIGH (ref 0.61–1.24)
GFR, Estimated: 51 mL/min — ABNORMAL LOW (ref >60)
Glucose, Bld: 138 mg/dL — ABNORMAL HIGH (ref 70–99)
Potassium: 3.4 mmol/L — ABNORMAL LOW (ref 3.5–5.1)
Sodium: 138 mmol/L (ref 135–145)
Total Bilirubin: 0.2 mg/dL — ABNORMAL LOW (ref 0.3–1.2)
Total Protein: 6.9 g/dL (ref 6.5–8.1)

## 2022-08-28 LAB — CBC WITH DIFFERENTIAL/PLATELET
Abs Immature Granulocytes: 0.03 10*3/uL (ref 0.00–0.07)
Basophils Absolute: 0 10*3/uL (ref 0.0–0.1)
Basophils Relative: 0 %
Eosinophils Absolute: 0.1 10*3/uL (ref 0.0–0.5)
Eosinophils Relative: 1 %
HCT: 37.9 % — ABNORMAL LOW (ref 39.0–52.0)
Hemoglobin: 12 g/dL — ABNORMAL LOW (ref 13.0–17.0)
Immature Granulocytes: 0 %
Lymphocytes Relative: 9 %
Lymphs Abs: 0.7 10*3/uL (ref 0.7–4.0)
MCH: 25.9 pg — ABNORMAL LOW (ref 26.0–34.0)
MCHC: 31.7 g/dL (ref 30.0–36.0)
MCV: 81.9 fL (ref 80.0–100.0)
Monocytes Absolute: 0.8 10*3/uL (ref 0.1–1.0)
Monocytes Relative: 10 %
Neutro Abs: 6.1 10*3/uL (ref 1.7–7.7)
Neutrophils Relative %: 80 %
Platelets: 275 10*3/uL (ref 150–400)
RBC: 4.63 MIL/uL (ref 4.22–5.81)
RDW: 14.2 % (ref 11.5–15.5)
WBC: 7.7 10*3/uL (ref 4.0–10.5)
nRBC: 0 % (ref 0.0–0.2)

## 2022-08-28 MED ORDER — HEPARIN SOD (PORK) LOCK FLUSH 100 UNIT/ML IV SOLN
500.0000 [IU] | Freq: Once | INTRAVENOUS | Status: AC
  Administered 2022-08-28: 500 [IU] via INTRAVENOUS
  Filled 2022-08-28: qty 5

## 2022-08-28 MED ORDER — SODIUM CHLORIDE 0.9% FLUSH
10.0000 mL | Freq: Once | INTRAVENOUS | Status: AC
  Administered 2022-08-28: 10 mL via INTRAVENOUS
  Filled 2022-08-28: qty 10

## 2022-08-28 NOTE — Progress Notes (Signed)
Patient here for follow up. No new concerns voiced.  °

## 2022-08-28 NOTE — Progress Notes (Unsigned)
Established Patient Office Visit  Subjective    Patient ID: Clarence Skinner, male    DOB: 1961/01/18  Age: 62 y.o. MRN: [id number]  CC:  No chief complaint on file.   HPI Clarence Skinner presents to follow up on chronic medical conditions.   Hypertension/OSA: -Medications: Amlodipine-Benazepril 10-40 mg, Lasix 20 mg PRN leg swelling (has to take 1-2 a year). Discussed holding medication if BP <120/80 -Patient is compliant with above medications and reports no side effects but has not taken blood pressure medication yet today -Checking BP at home (average): No -Denies any SOB, CP, vision changes, LE edema but occasionally gets lightheaded and tired, uncertain if he needs full blood pressure dose -Unable to tolerate CPAP machine   COPD: -COPD status: stable -Current medications: Trelegy, Albuterol  -Satisfied with current treatment?: yes -Oxygen use: no -Dyspnea frequency: yes -Cough frequency: yes -Rescue inhaler frequency: monthly sometimes more -Limitation of activity: yes -Pneumovax: unknown - getting  vaccine records -Influenza: Up to Date -Referral placed to pulmonology for OSA work up   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, following with Oncology, note and labs reviewed from 07/05/22 -Had been on chemotherapy and radiation, planning for PET scan 1/17  History of Prostate cancer: -Following with Urology, note reviewed from 12/27/21 -First diagnosed in 07/2018 -Underwent brachytherapy with seen implant 01/2019 -No urinary complaints -Last PSA 5/23 stable at 0.3  GERD: -Currently on Protonix 40 mg, no symptoms  Gout: -Currently Allopurinol 300 mg -Usually flares in the big toe -No flares since being on Alloprinil   Pre-Diabetes: -A1c 1/24 5.7% -Not currently on medications  Health Maintenance: -Blood work UTD -Lung cancer screening due. Referral placed at [facility] -Colon cancer screening due, will discuss at follow up -Obtaining  vaccine history    Outpatient Encounter Medications as of 08/29/2022  Medication Sig   albuterol (PROVENTIL HFA;VENTOLIN HFA) 108 (90 Base) MCG/ACT inhaler Inhale into the lungs every 6 (six) hours as needed for wheezing or shortness of breath.   allopurinol (ZYLOPRIM) 300 MG tablet Take 300 mg by mouth daily.   amLODipine-benazepril (LOTREL) 10-40 MG capsule Take 1 capsule by mouth daily.   Azelastine HCl 137 MCG/SPRAY SOLN PLACE 1 SPRAY INTO THE NOSE 2 (TWO) TIMES DAILY AS NEEDED.   baclofen (LIORESAL) 10 MG tablet Take 10 mg by mouth 2 (two) times daily as needed.   benzonatate (TESSALON) 100 MG capsule Take 1 capsule (100 mg total) by mouth every 8 (eight) hours as needed.   docusate sodium (COLACE) 100 MG capsule Take 100 mg by mouth daily as needed.   doxycycline (MONODOX) 100 MG capsule Take 100 mg by mouth 2 (two) times daily.   EQ BUDESONIDE NASAL NA Place into the nose.   fluticasone (FLONASE) 50 MCG/ACT nasal spray Place into both nostrils daily as needed for allergies or rhinitis.   Fluticasone-Umeclidin-Vilant (TRELEGY ELLIPTA) 100-62.5-25 MCG/ACT AEPB Trelegy Ellipta 100-62.5-25 MCG/INH Inhalation Aerosol Powder Breath Activated QTY: 90 each Days: 90 Refills: 1  Written: 11/02/20 Patient Instructions: One puff once a day   furosemide (LASIX) 20 MG tablet Take 20 mg by mouth as needed.   ibuprofen (ADVIL) 800 MG tablet TAKE ONE TABLET BY MOUTH EVERY 12 HOURS AS NEEDED. WITH FOOD   lidocaine-prilocaine (EMLA) cream Apply 1 Application topically as directed. Apply small amount of cream to port a cath site 1-2 hours prior to port being accessed.   loratadine (CLARITIN) 10 MG tablet Take 10 mg by mouth daily.   magic  mouthwash (multi-ingredient) oral suspension Swish and spit 5-10 mls four times a day as needed   magic mouthwash w/lidocaine SOLN Take 5 mLs by mouth 4 (four) times daily as needed for mouth pain. Sig: Swish/Spit 5-10 ml four times a day as needed. Dispense 480 ml. 1RF    Oxycodone HCl 10 MG TABS Take 10 mg by mouth every 6 (six) hours as needed.   pantoprazole (PROTONIX) 40 MG tablet Take 40 mg by mouth daily.   senna (SENOKOT) 8.6 MG TABS tablet Take 1 tablet (8.6 mg total) by mouth daily.   tadalafil (CIALIS) 20 MG tablet Take 1 tablet (20 mg total) by mouth daily.   Vitamin D, Ergocalciferol, (DRISDOL) 1.25 MG (50000 UNIT) CAPS capsule Take 50,000 Units by mouth once a week.   Facility-Administered Encounter Medications as of 08/29/2022  Medication   sodium chloride flush (NS) 0.9 % injection 10 mL    Past Medical History:  Diagnosis Date   COPD (chronic obstructive pulmonary disease) ([place])    ED (erectile dysfunction)    GERD (gastroesophageal reflux disease)    Gout    Hypertension    Multilevel degenerative disc disease    Pneumonia 11/29/2017   Prostate cancer ([place])    Sleep apnea    CPAP   Tachycardia    Wears dentures    partial upper and lower    Past Surgical History:  Procedure Laterality Date   ABCESS DRAINAGE  2009   tonsil   COLONOSCOPY WITH PROPOFOL N/A 02/11/2018   Procedure: COLONOSCOPY WITH PROPOFOL;  Surgeon: Toledo, Benay Pike, MD;  Location: [facility];  Service: Endoscopy;  Laterality: N/A;   ESOPHAGOGASTRODUODENOSCOPY (EGD) WITH PROPOFOL N/A 02/11/2018   Procedure: ESOPHAGOGASTRODUODENOSCOPY (EGD) WITH PROPOFOL;  Surgeon: Toledo, Benay Pike, MD;  Location: [facility];  Service: Endoscopy;  Laterality: N/A;   IR IMAGING GUIDED PORT INSERTION  04/09/2022   MASS EXCISION Right 08/14/2017   Procedure: EXCISION RIGHT TEMPORAL MASS SUBMENTAL MASS;  Surgeon: Carloyn Manner, MD;  Location: [facility];  Service: ENT;  Laterality: Right;  sleep apnea   RADIOACTIVE SEED IMPLANT N/A 01/26/2019   Procedure: RADIOACTIVE SEED IMPLANT/BRACHYTHERAPY IMPLANT;  Surgeon: Billey Co, MD;  Location: [facility];  Service: Urology;  Laterality: N/A;   SEPTOPLASTY  2016   [place], [place]   VOLUME STUDY N/A 12/29/2018   Procedure:  VOLUME STUDY;  Surgeon: Billey Co, MD;  Location: [facility];  Service: Urology;  Laterality: N/A;    Family History  Problem Relation Age of Onset   Anxiety disorder Mother    Cancer Father    Hypertension Sister    Diabetes Sister    Asthma Sister    Other Brother        mva  at age 24   Gout Brother    Hypertension Brother    Prostate cancer Maternal Uncle    Prostate cancer Maternal Uncle     Social History   Socioeconomic History   Marital status: Significant Other    Spouse name: Not on file   Number of children: Not on file   Years of education: Not on file   Highest education level: Not on file  Occupational History   Not on file  Tobacco Use   Smoking status: Every Day    Packs/day: 1.00    Years: 30.00    Total pack years: 30.00    Types: Cigarettes   Smokeless tobacco: Former    Types: Snuff, Loss adjuster, chartered  Quit date: 12/05/1984   Tobacco comments:    since age 32  Vaping Use   Vaping Use: Never used  Substance and Sexual Activity   Alcohol use: Not Currently    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   Drug use: Not Currently   Sexual activity: Yes  Other Topics Concern   Not on file  Social History Narrative   Not on file   Social Determinants of Health   Financial Resource Strain: High Risk (04/03/2022)   Overall Financial Resource Strain (CARDIA)    Difficulty of Paying Living Expenses: Hard  Food Insecurity: Food Insecurity Present (04/03/2022)   Hunger Vital Sign    Worried About Running Out of Food in the Last Year: Often true    Ran Out of Food in the Last Year: Often true  Transportation Needs: Unmet Transportation Needs (04/03/2022)   PRAPARE - Hydrologist (Medical): Yes    Lack of Transportation (Non-Medical): Yes  Physical Activity: Inactive (04/03/2022)   Exercise Vital Sign    Days of Exercise per Week: 0 days    Minutes of Exercise per Session: 0 min  Stress: Stress Concern Present  (04/03/2022)   [place]    Feeling of Stress : Very much  Social Connections: Moderately Isolated (04/03/2022)   Social Connection and Isolation Panel [NHANES]    Frequency of Communication with Friends and Family: Never    Frequency of Social Gatherings with Friends and Family: Three times a week    Attends Religious Services: Never    Active Member of Clubs or Organizations: No    Attends Archivist Meetings: Not on file    Marital Status: Living with partner  Intimate Partner Violence: Not At Risk (04/03/2022)   Humiliation, Afraid, Rape, and Kick questionnaire    Fear of Current or Ex-Partner: No    Emotionally Abused: No    Physically Abused: No    Sexually Abused: No    Review of Systems  Constitutional:  Negative for chills and fever.  Respiratory:  Positive for cough, shortness of breath and wheezing.   Cardiovascular:  Negative for chest pain.  Gastrointestinal:  Negative for abdominal pain.      Objective    There were no vitals taken for this visit.  Physical Exam Constitutional:      Appearance: Normal appearance.  HENT:     Head: Normocephalic and atraumatic.  Eyes:     Conjunctiva/sclera: Conjunctivae normal.  Cardiovascular:     Rate and Rhythm: Normal rate and regular rhythm.  Pulmonary:     Effort: Pulmonary effort is normal.     Breath sounds: Normal breath sounds.  Musculoskeletal:     Cervical back: No tenderness.     Right lower leg: No edema.     Left lower leg: No edema.  Skin:    General: Skin is warm and dry.  Neurological:     General: No focal deficit present.     Mental Status: He is alert. Mental status is at baseline.  Psychiatric:        Mood and Affect: Mood normal.        Behavior: Behavior normal.     Last CBC Lab Results  Component Value Date   WBC 4.9 07/05/2022   HGB 11.2 (L) 07/05/2022   HCT 34.2 (L) 07/05/2022   MCV 81.6 07/05/2022   MCH  26.7 07/05/2022  RDW 18.6 (H) 07/05/2022   PLT 413 (H) 00/93/8182   Last metabolic panel Lab Results  Component Value Date   GLUCOSE 112 (H) 07/05/2022   NA 137 07/05/2022   K 3.6 07/05/2022   CL 104 07/05/2022   CO2 24 07/05/2022   BUN 12 07/05/2022   CREATININE 0.72 07/05/2022   GFRNONAA >60 07/05/2022   CALCIUM 8.8 (L) 07/05/2022   PROT 7.1 07/05/2022   ALBUMIN 3.8 07/05/2022   BILITOT 0.4 07/05/2022   ALKPHOS 74 07/05/2022   AST 14 (L) 07/05/2022   ALT 11 07/05/2022   ANIONGAP 9 07/05/2022   Last lipids Lab Results  Component Value Date   CHOL 152 08/01/2022   HDL 54 08/01/2022   LDLCALC 82 08/01/2022   TRIG 81 08/01/2022   CHOLHDL 2.8 08/01/2022   Last hemoglobin A1c Lab Results  Component Value Date   HGBA1C 5.7 (H) 08/01/2022   Last thyroid functions Lab Results  Component Value Date   TSH 0.978 07/05/2022   Last vitamin D No results found for: "25OHVITD2", "25OHVITD3", "VD25OH" Last vitamin B12 and Folate No results found for: "VITAMINB12", "FOLATE"      Assessment & Plan:   1. Essential hypertension: Blood pressure on the lower end today, patient has not taken his medication yet.  He states sometimes he will feel lightheaded and fatigued.  He is currently on amlodipine-benazepril 10-40 mg daily.  He has a blood pressure cuff at home but is not checking currently, he will check his blood pressure once daily and if it is lower than 120/80 he will hold his medication.  Will write down his blood pressure values and follow-up here in 1 months we can determine if he continues to require his current dose of medication.  Follow-up in 1 month.  2. Obstructive sleep apnea: Has a CPAP machine but is unable to tolerate it, states he needs a new titration study.  Referral to sleep medicine ordered today.  - Ambulatory referral to Pulmonology  3. Chronic obstructive pulmonary disease, unspecified COPD type ([place]): Stable, doing well on Trelegy and albuterol as  needed.  He also has a DuoNeb neb machine to use as needed.  4. History of prostate cancer: Following with urology, note reviewed from 12/27/2021.  PSA last checked in May of last year, has been stable.  Denies urinary symptoms today.  5. 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[place]): Following with oncology, note and labs reviewed from 07/10/2022.  He is currently undergoing chemotherapy and radiation.  He is scheduled for a PET scan on 08/08/2022.  6. Gastroesophageal reflux disease, unspecified whether esophagitis present: Stable on Protonix 40 mg  7. Chronic gout involving toe without tophus, unspecified cause, unspecified laterality: Doing well, has not had a flare since he started prophylactic allopurinol 300 mg daily.  8. Encounter for hepatitis C screening test for low risk patient/Lipid screening: Screening labs due.  - Hepatitis C Antibody - Lipid Profile  9. Prediabetes: He was told he was prediabetic for years ago when he was living in [place], he has not had an A1c in multiple years, will check today.  - HgB A1c  10. Tobacco use: Lung cancer screening referral placed.  - Ambulatory Referral Lung Cancer Screening [facility]   No follow-ups on file.   Teodora Medici, DO

## 2022-08-28 NOTE — Assessment & Plan Note (Signed)
Likely due to NSAID use. Recommend patient to stop ibuprofen, take tylenol, if pain is not controlled, he may use Oxycodone PRN for pain.

## 2022-08-28 NOTE — Progress Notes (Signed)
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 S/p concurrent chemotherapy with radiation. Labs are reviewed and discussed with patient. PET scan assessment of treatment response results were reviewed and discussed with patient. Partial response.  Status post ENT evaluation New small left cervical lymphadenopathy-reactive versus secondary to malignancy.  Repeat CT scan in March 2024.  Normocytic anemia Hemoglobin is stable. monitor  AKI (acute kidney injury) ([place]) Likely due to NSAID use. Recommend patient to stop ibuprofen, take tylenol, if pain is not controlled, he may use Oxycodone PRN for pain.    Orders Placed This Encounter  Procedures   CBC with Differential/Platelet    Standing Status:   Future    Standing Expiration Date:   08/28/2023   Comprehensive metabolic panel    Standing Status:   Future    Standing Expiration Date:   08/28/2023     Follow up in March 2024  All questions were answered. The patient knows to call the clinic with any problems, questions or concerns. No barriers to learning was detected.  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 male presents to establish  care for  I have reviewed his chart and materials related to his cancer extensively and collaborated history with the patient. Summary of oncologic history is as follows: Oncology History  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Clarence Skinner)  02/26/2022 Imaging   CTA neck showed 1, 4.3 x 2.5 cm ovoid soft tissue focus at the right level II/III stations (along the posterior aspect of the proximal ICA, carotid bifurcation and distal CCA). This likely reflects an enlarged lymph node.  2 Abnormal soft tissue surrounding the mid-to-distal cervical right ICA with resultant mild vessel narrowing 3 Atherosclerotic plaque within the bilateral common carotid and cervical internal carotid arteries without hemodynamically significant stenosis. 4 Vertebral arteries patent within the neck. Mild-to-moderate atherosclerotic narrowing at the origin of the right vertebral artery. No more than mild atherosclerotic stenosis at the origin of the left vertebral artery. 5 Aortic Atherosclerosis and Emphysema 6. Cervical spondylosis   02/27/2022 Imaging   MRI neck soft tissue w wo Constellation of findings most compatible with a roughly 2.4 cm Right Tonsillar Carcinoma (series 12, image 11) with associated right level 2 and level 3 malignant nodal, ex-nodal disease (series 14, image 13). Recommend ENT consultation    02/27/2022 Imaging   02/27/2022 MRI brain without contrast showed  No acute intracranial abnormality. Abnormal right neck soft tissues. See dedicated Neck MRI today reported separately. 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 These are slow flow vascular malformations which are generally asymptomatic. [name] Other moderately advanced but nonspecific cerebral white matter signal changes.   02/28/2022 Initial Diagnosis   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[facility])  02/28/2022, status post ultrasound core biopsy. Metastatic squamous cell  carcinoma. Moderately differentiated with focal keratinization.  p16 negative.    02/28/2022 Cancer Staging   Staging form: Pharynx - P16 Negative Oropharynx, AJCC 8th Edition - Clinical stage from 02/28/2022: Stage IVA (cT2, cN2b, cM0, p16-) - Signed by [name], MD on 03/22/2022 Stage prefix: Initial diagnosis   03/19/2022 Imaging   PET scan showed Signs of RIGHT neck malignancy with metastatic involvement just below the skull base and at level II/III also on the RIGHT. No signs of contralateral nodal involvement.   03/28/2022 -  Chemotherapy   Patient is on Treatment Plan : HEAD/NECK Carboplatin (AUC 2) q7d     08/22/2022 Imaging   PET scan showed 1. Response therapy as evidenced by decrease in size and hypermetabolism of a primary right tonsillar nodule and right level 2 adenopathy. 2. New 8 mm left level 2 hypermetabolic lymph node. Recommend attention on follow-up. 3. Aortic atherosclerosis (ICD10-I70.0). Coronary artery calcification. 4.  Emphysema    Patient initially presented emergency room due to intermittent syncope/near syncope episodes, diaphoretic, nauseated. Imaging findings are concerning for right tonsil mass with level 2/level 3 lymph node involvement which contributes to syncope/near syncope episodes -carotid sinus syndrome. 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  COVID-19 infection.  Chemotherapy radiation was held for 2 weeks.    INTERVAL HISTORY Clarence Skinner is a 62 y.o. male who has above history reviewed by me today presents for follow up visit for management of stage IVa right tonsil squamous cell carcinoma No fever, chills, nausea vomiting.  He has had post treatment evaluation by Dr. Pryor Ochoa.  + right tooth pain, on antibiotic prescribed by dentist. Also take Ibuprofen PRN for pain.   MEDICAL HISTORY:  Past Medical History:  Diagnosis Date   COPD (chronic obstructive pulmonary disease) ([place])    ED (erectile dysfunction)    GERD  (gastroesophageal reflux disease)    Gout    Hypertension    Multilevel degenerative disc disease    Pneumonia 11/29/2017   Prostate cancer ([place])    Sleep apnea    CPAP   Tachycardia    Wears dentures    partial upper and lower    SURGICAL HISTORY: Past Surgical History:  Procedure Laterality Date   ABCESS DRAINAGE  2009   tonsil   COLONOSCOPY WITH PROPOFOL N/A 02/11/2018   Procedure: COLONOSCOPY WITH PROPOFOL;  Surgeon: Toledo, Benay Pike, MD;  Location: [facility];  Service: Endoscopy;  Laterality: N/A;   ESOPHAGOGASTRODUODENOSCOPY (EGD) WITH PROPOFOL N/A 02/11/2018   Procedure: ESOPHAGOGASTRODUODENOSCOPY (EGD) WITH PROPOFOL;  Surgeon: Toledo, Benay Pike, MD;  Location: [facility];  Service: Endoscopy;  Laterality: N/A;   IR IMAGING GUIDED PORT INSERTION  04/09/2022   MASS EXCISION Right 08/14/2017   Procedure: EXCISION RIGHT TEMPORAL MASS SUBMENTAL MASS;  Surgeon: Carloyn Manner, MD;  Location: [place];  Service: ENT;  Laterality: Right;  sleep apnea   RADIOACTIVE SEED IMPLANT N/A 01/26/2019   Procedure: RADIOACTIVE SEED IMPLANT/BRACHYTHERAPY IMPLANT;  Surgeon: Billey Co, MD;  Location: [facility];  Service: Urology;  Laterality: N/A;   SEPTOPLASTY  2016   [place]   VOLUME STUDY N/A 12/29/2018   Procedure: VOLUME STUDY;  Surgeon: Billey Co, MD;  Location: [facility];  Service: Urology;  Laterality: N/A;    SOCIAL HISTORY: Social History   Socioeconomic History   Marital status: Significant Other    Spouse name: Not on file   Number of children:  Not on file   Years of education: Not on file   Highest education level: Not on file  Occupational History   Not on file  Tobacco Use   Smoking status: Every Day    Packs/day: 1.00    Years: 30.00    Total pack years: 30.00    Types: Cigarettes   Smokeless tobacco: Former    Types: Snuff, Chew    Quit date: 12/05/1984   Tobacco comments:    since age 9  Vaping Use   Vaping Use: Never used   Substance and Sexual Activity   Alcohol use: Not Currently    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   Drug use: Not Currently   Sexual activity: Yes  Other Topics Concern   Not on file  Social History Narrative   Not on file   Social Determinants of Health   Financial Resource Strain: High Risk (04/03/2022)   Overall Financial Resource Strain (CARDIA)    Difficulty of Paying Living Expenses: Hard  Food Insecurity: Food Insecurity Present (04/03/2022)   Hunger Vital Sign    Worried About Running Out of Food in the Last Year: Often true    Ran Out of Food in the Last Year: Often true  Transportation Needs: Unmet Transportation Needs (04/03/2022)   PRAPARE - Hydrologist (Medical): Yes    Lack of Transportation (Non-Medical): Yes  Physical Activity: Inactive (04/03/2022)   Exercise Vital Sign    Days of Exercise per Week: 0 days    Minutes of Exercise per Session: 0 min  Stress: Stress Concern Present (04/03/2022)   [place]    Feeling of Stress : Very much  Social Connections: Moderately Isolated (04/03/2022)   Social Connection and Isolation Panel [NHANES]    Frequency of Communication with Friends and Family: Never    Frequency of Social Gatherings with Friends and Family: Three times a week    Attends Religious Services: Never    Active Member of Clubs or Organizations: No    Attends Archivist Meetings: Not on file    Marital Status: Living with partner  Intimate Partner Violence: Not At Risk (04/03/2022)   Humiliation, Afraid, Rape, and Kick questionnaire    Fear of Current or Ex-Partner: No    Emotionally Abused: No    Physically Abused: No    Sexually Abused: No    FAMILY HISTORY: Family History  Problem Relation Age of Onset   Anxiety disorder Mother    Cancer Father    Hypertension Sister    Diabetes Sister    Asthma Sister    Other Brother         mva  at age 51   Gout Brother    Hypertension Brother    Prostate cancer Maternal Uncle    Prostate cancer Maternal Uncle     ALLERGIES:  is allergic to chocolate, eggs or egg-derived products, milk (cow), benadryl [diphenhydramine], and milk-related compounds.  MEDICATIONS:  Current Outpatient Medications  Medication Sig Dispense Refill   albuterol (PROVENTIL HFA;VENTOLIN HFA) 108 (90 Base) MCG/ACT inhaler Inhale into the lungs every 6 (six) hours as needed for wheezing or shortness of breath.     allopurinol (ZYLOPRIM) 300 MG tablet Take 300 mg by mouth daily.     amoxicillin (AMOXIL) 875 MG tablet Take 875 mg by mouth 2 (two) times daily. Taking until Sunday     Azelastine HCl  137 MCG/SPRAY SOLN PLACE 1 SPRAY INTO THE NOSE 2 (TWO) TIMES DAILY AS NEEDED. 30 mL 1   baclofen (LIORESAL) 10 MG tablet Take 10 mg by mouth 2 (two) times daily as needed.     benzonatate (TESSALON) 100 MG capsule Take 1 capsule (100 mg total) by mouth every 8 (eight) hours as needed. 30 capsule 0   fluticasone (FLONASE) 50 MCG/ACT nasal spray Place into both nostrils daily as needed for allergies or rhinitis.     Fluticasone-Umeclidin-Vilant (TRELEGY ELLIPTA) 100-62.5-25 MCG/ACT AEPB Trelegy Ellipta 100-62.5-25 MCG/INH Inhalation Aerosol Powder Breath Activated QTY: 90 each Days: 90 Refills: 1  Written: 11/02/20 Patient Instructions: One puff once a day     furosemide (LASIX) 20 MG tablet Take 20 mg by mouth as needed.     ibuprofen (ADVIL) 800 MG tablet TAKE ONE TABLET BY MOUTH EVERY 12 HOURS AS NEEDED. WITH FOOD 60 tablet 3   lidocaine-prilocaine (EMLA) cream Apply 1 Application topically as directed. Apply small amount of cream to port a cath site 1-2 hours prior to port being accessed. 30 g 2   pantoprazole (PROTONIX) 40 MG tablet Take 40 mg by mouth daily.     rosuvastatin (CRESTOR) 20 MG tablet Take 20 mg by mouth daily.     tadalafil (CIALIS) 20 MG tablet Take 1 tablet (20 mg total) by mouth daily. 30  tablet 11   Vitamin D, Ergocalciferol, (DRISDOL) 1.25 MG (50000 UNIT) CAPS capsule Take 50,000 Units by mouth once a week.     amLODipine-benazepril (LOTREL) 10-40 MG capsule Take 1 capsule by mouth daily. (Patient not taking: Reported on 08/28/2022)     docusate sodium (COLACE) 100 MG capsule Take 100 mg by mouth daily as needed. (Patient not taking: Reported on 08/28/2022)     loratadine (CLARITIN) 10 MG tablet Take 10 mg by mouth daily. (Patient not taking: Reported on 08/28/2022)     magic mouthwash (multi-ingredient) oral suspension Swish and spit 5-10 mls four times a day as needed (Patient not taking: Reported on 08/28/2022) 480 mL 1   magic mouthwash w/lidocaine SOLN Take 5 mLs by mouth 4 (four) times daily as needed for mouth pain. Sig: Swish/Spit 5-10 ml four times a day as needed. Dispense 480 ml. 1RF (Patient not taking: Reported on 08/28/2022) 480 mL 1   Oxycodone HCl 10 MG TABS Take 10 mg by mouth every 6 (six) hours as needed. (Patient not taking: Reported on 08/28/2022)     senna (SENOKOT) 8.6 MG TABS tablet Take 1 tablet (8.6 mg total) by mouth daily. (Patient not taking: Reported on 08/28/2022) 120 tablet 0   No current facility-administered medications for this visit.   Facility-Administered Medications Ordered in Other Visits  Medication Dose Route Frequency Provider Last Rate Last Admin   sodium chloride flush (NS) 0.9 % injection 10 mL  10 mL Intracatheter PRN [name], MD   10 mL at 04/11/22 1221    Review of Systems  Constitutional:  Negative for chills, fatigue, fever and unexpected weight change.  HENT:   Positive for hearing loss. Negative for voice change.        Mouth soreness.   Eyes:  Negative for eye problems and icterus.  Respiratory:  Negative for chest tightness, cough and shortness of breath.   Cardiovascular:  Negative for chest pain and leg swelling.  Gastrointestinal:  Negative for abdominal distention and abdominal pain.  Endocrine: Negative for hot flashes.   Genitourinary:  Negative for difficulty urinating, dysuria and frequency.  Musculoskeletal:  Positive for arthralgias.  Skin:  Negative for itching and rash.  Neurological:  Negative for light-headedness and numbness.  Hematological:  Negative for adenopathy. Does not bruise/bleed easily.  Psychiatric/Behavioral:  Negative for confusion.      PHYSICAL EXAMINATION: ECOG PERFORMANCE STATUS: 1 - Symptomatic but completely ambulatory  Vitals:   08/28/22 1357  BP: 120/76  Pulse: 95  Resp: 18  Temp: (!) 97 F (36.1 C)   Filed Weights   08/28/22 1357  Weight: 173 lb 11.2 oz (78.8 kg)    Physical Exam Constitutional:      General: He is not in acute distress.    Appearance: He is not diaphoretic.  HENT:     Head: Normocephalic and atraumatic.     Nose: Nose normal.     Mouth/Throat:     Pharynx: No oropharyngeal exudate.  Eyes:     General: No scleral icterus.    Pupils: Pupils are equal, round, and reactive to light.  Cardiovascular:     Rate and Rhythm: Normal rate and regular rhythm.     Heart sounds: No murmur heard. Pulmonary:     Effort: Pulmonary effort is normal. No respiratory distress.     Breath sounds: No rales.  Chest:     Chest wall: No tenderness.  Abdominal:     General: There is no distension.     Palpations: Abdomen is soft.     Tenderness: There is no abdominal tenderness.  Musculoskeletal:        General: Normal range of motion.     Cervical back: Normal range of motion and neck supple.  Skin:    General: Skin is warm and dry.     Findings: No erythema.  Neurological:     Mental Status: He is alert and oriented to person, place, and time.     Cranial Nerves: No cranial nerve deficit.     Motor: No abnormal muscle tone.     Coordination: Coordination normal.  Psychiatric:        Mood and Affect: Mood and affect normal.      LABORATORY DATA:  I have reviewed the data as listed    Latest Ref Rng & Units 08/28/2022    1:29 PM 07/05/2022    10:09 AM 06/13/2022    8:59 AM  CBC  WBC 4.0 - 10.5 K/uL 7.7  4.9  3.4   Hemoglobin 13.0 - 17.0 g/dL 12.0  11.2  10.8   Hematocrit 39.0 - 52.0 % 37.9  34.2  33.0   Platelets 150 - 400 K/uL 275  413  167       Latest Ref Rng & Units 08/28/2022    1:29 PM 07/05/2022   10:09 AM 06/13/2022    8:59 AM  CMP  Glucose 70 - 99 mg/dL 138  112  131   BUN 6 - 20 mg/dL '19  12  12   '$ Creatinine 0.61 - 1.24 mg/dL 1.56  0.72  0.77   Sodium 135 - 145 mmol/L 138  137  133   Potassium 3.5 - 5.1 mmol/L 3.4  3.6  4.0   Chloride 98 - 111 mmol/L 105  104  103   CO2 22 - 32 mmol/L '23  24  23   '$ Calcium 8.9 - 10.3 mg/dL 8.5  8.8  8.8   Total Protein 6.5 - 8.1 g/dL 6.9  7.1  7.3   Total Bilirubin 0.3 - 1.2 mg/dL 0.2  0.4  0.4  Alkaline Phos 38 - 126 U/L 70  74  89   AST 15 - 41 U/L '12  14  31   '$ ALT 0 - 44 U/L 10  11  55       RADIOGRAPHIC STUDIES: I have personally reviewed the radiological images as listed and agreed with the findings in the report. NM PET Image Restag (PS) Skull Base To Thigh  Result Date: 08/23/2022 CLINICAL DATA:  Subsequent treatment strategy for tonsillar squamous cell carcinoma. EXAM: NUCLEAR MEDICINE PET SKULL BASE TO THIGH TECHNIQUE: 9.3 mCi F-18 FDG was injected intravenously. Full-ring PET imaging was performed from the skull base to thigh after the radiotracer. CT data was obtained and used for attenuation correction and anatomic localization. Fasting blood glucose: 88 mg/dl COMPARISON:  03/19/2022. FINDINGS: Mediastinal blood pool activity: SUV max 2.0 Liver activity: SUV max NA NECK: 8 mm left level 2 lymph node, SUV max 8.0, new. 1.9 x 2.3 cm residual right tonsillar nodule, SUV max 4.6, decreased from 1.4 x 2.1 cm and 19.0 previously. Right level 2 lymph node has decreased in size, now measuring 1.7 x 2.3 cm (2/48), SUV max 3.2, compared to 2.6 x 3.6 cm and 18.0 previously. Incidental CT findings: None. CHEST: No abnormal hypermetabolism. Incidental CT findings: Right IJ  Port-A-Cath terminates in the low SVC. Coronary artery calcification. Heart size normal. No pericardial or pleural effusion. Centrilobular emphysema. ABDOMEN/PELVIS: No abnormal hypermetabolism. Incidental CT findings: Liver, gallbladder, adrenal glands, kidneys, spleen, pancreas, stomach and bowel are grossly unremarkable. Atherosclerotic calcification of the aorta. 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 SKELETON: No abnormal hypermetabolism. Incidental CT findings: None. IMPRESSION: 1. Response therapy as evidenced by decrease in size and hypermetabolism of a primary right tonsillar nodule and right level 2 adenopathy. 2. New 8 mm left level 2 hypermetabolic lymph node. Recommend attention on follow-up. 3. Aortic atherosclerosis (ICD10-I70.0). Coronary artery calcification. 4.  Emphysema (ICD10-J43.9). Electronically Signed   By: Lorin Picket M.D.   On: 08/23/2022 10:52

## 2022-08-28 NOTE — Assessment & Plan Note (Addendum)
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 S/p concurrent chemotherapy with radiation. Labs are reviewed and discussed with patient. PET scan assessment of treatment response results were reviewed and discussed with patient. Partial response.  Status post ENT evaluation New small left cervical lymphadenopathy-reactive versus secondary to malignancy.  Repeat CT scan in March 2024.

## 2022-08-28 NOTE — Assessment & Plan Note (Signed)
Hemoglobin is stable. monitor

## 2022-08-29 ENCOUNTER — Encounter: Payer: Self-pay | Admitting: Internal Medicine

## 2022-08-29 ENCOUNTER — Ambulatory Visit: Payer: Medicaid Other | Admitting: Internal Medicine

## 2022-08-29 VITALS — BP 112/62 | HR 106 | Temp 97.0°F | Resp 18 | Ht 71.0 in | Wt 173.1 lb

## 2022-08-29 DIAGNOSIS — Z23 Encounter for immunization: Secondary | ICD-10-CM

## 2022-08-29 DIAGNOSIS — C099 Malignant neoplasm of tonsil, unspecified: Secondary | ICD-10-CM

## 2022-08-29 DIAGNOSIS — E782 Mixed hyperlipidemia: Secondary | ICD-10-CM

## 2022-08-29 DIAGNOSIS — I10 Essential (primary) hypertension: Secondary | ICD-10-CM | POA: Diagnosis not present

## 2022-08-29 DIAGNOSIS — Z7689 Persons encountering health services in other specified circumstances: Secondary | ICD-10-CM | POA: Diagnosis not present

## 2022-08-29 MED ORDER — AMLODIPINE BESY-BENAZEPRIL HCL 5-20 MG PO CAPS: 1.0000 | ORAL_CAPSULE | Freq: Every day | ORAL | 0 refills | Status: DC

## 2022-08-29 NOTE — Patient Instructions (Addendum)
It was great seeing you today!  Plan discussed at today's visit: -Decrease blood pressure medication to Amlodipine-Benazepril 5-20 mg daily. If BP is <120/80 do not take any medication -Continue to monitor blood pressure at home -Tdap vaccine today - ok to take Tylenol   Follow up in: 1 month   Take care and let us know if you have any questions or concerns prior to your next visit.  Dr. Rosana Berger

## 2022-09-03 DIAGNOSIS — Z7689 Persons encountering health services in other specified circumstances: Secondary | ICD-10-CM | POA: Diagnosis not present

## 2022-09-06 ENCOUNTER — Encounter: Payer: Medicaid Other | Admitting: Physical Therapy

## 2022-09-07 ENCOUNTER — Ambulatory Visit (INDEPENDENT_AMBULATORY_CARE_PROVIDER_SITE_OTHER): Payer: Medicaid Other | Admitting: Primary Care

## 2022-09-07 ENCOUNTER — Encounter: Payer: Self-pay | Admitting: Primary Care

## 2022-09-07 ENCOUNTER — Telehealth: Payer: Self-pay

## 2022-09-07 VITALS — BP 122/60 | HR 95 | Temp 97.8°F | Ht 71.0 in | Wt 166.0 lb

## 2022-09-07 DIAGNOSIS — G4733 Obstructive sleep apnea (adult) (pediatric): Secondary | ICD-10-CM

## 2022-09-07 DIAGNOSIS — J449 Chronic obstructive pulmonary disease, unspecified: Secondary | ICD-10-CM

## 2022-09-07 NOTE — Assessment & Plan Note (Signed)
-   Hx sleep apnea. Difficulty tolerating CPAP in the past d/t cough. He stopped wearing several years ago. Continues to have restless sleep and snoring symptoms. He is open to resume therapy. He has two CPAP machines, needs new supplies. Requesting sleep study from [facility]. Once we get this we can place an order for him to establish with local DME company, if unable to obtain records he may need to return to lab for CPAP titration study. FU in 6 weeks or sooner if needed.

## 2022-09-07 NOTE — Patient Instructions (Addendum)
Pleas let us know what medical supply store you received your CPAP from AND/OR where exactly you have your sleep study   If we can get a copy of you most recent sleep study we should be able to get your establish with a local medical supply company in [place] to give you new supplies for your CPAP  Once you get new CPAP supplies please resume wearing every night 4-6 hours or longer   Orders: Request sleep study from [facility] [phone]  Follow-up: 6 weeks with Beth NP   CPAP and BIPAP Information CPAP and BIPAP are methods that use air pressure to keep your airways open and to help you breathe well. CPAP and BIPAP use different amounts of pressure. Your health care provider will tell you whether CPAP or BIPAP would be more helpful for you. CPAP stands for "continuous positive airway pressure." With CPAP, the amount of pressure stays the same while you breathe in (inhale) and out (exhale). BIPAP stands for "bi-level positive airway pressure." With BIPAP, the amount of pressure will be higher when you inhale and lower when you exhale. This allows you to take larger breaths. CPAP or BIPAP may be used in the hospital, or your health care provider may want you to use it at home. You may need to have a sleep study before your health care provider can order a machine for you to use at home. What are the advantages? CPAP or BIPAP can be helpful if you have: Sleep apnea. Chronic obstructive pulmonary disease (COPD). Heart failure. Medical conditions that cause muscle weakness, including muscular dystrophy or amyotrophic lateral sclerosis (ALS). Other problems that cause breathing to be shallow, weak, abnormal, or difficult. CPAP and BIPAP are most commonly used for obstructive sleep apnea (OSA) to keep the airways from collapsing when the muscles relax during sleep. What are the risks? Generally, this is a safe treatment. However, problems may occur, including: Irritated skin or skin  sores if the mask does not fit properly. Dry or stuffy nose or nosebleeds. Dry mouth. Feeling gassy or bloated. Sinus or lung infection if the equipment is not cleaned properly. When should CPAP or BIPAP be used? In most cases, the mask only needs to be worn during sleep. Generally, the mask needs to be worn throughout the night and during any daytime naps. People with certain medical conditions may also need to wear the mask at other times, such as when they are awake. Follow instructions from your health care provider about when to use the machine. What happens during CPAP or BIPAP?  Both CPAP and BIPAP are provided by a small machine with a flexible plastic tube that attaches to a plastic mask that you wear. Air is blown through the mask into your nose or mouth. The amount of pressure that is used to blow the air can be adjusted on the machine. Your health care provider will set the pressure setting and help you find the best mask for you. Tips for using the mask Because the mask needs to be snug, some people feel trapped or closed-in (claustrophobic) when first using the mask. If you feel this way, you may need to get used to the mask. One way to do this is to hold the mask loosely over your nose or mouth and then gradually apply the mask more snugly. You can also gradually increase the amount of time that you use the mask. Masks are available in various types and sizes. If your mask does  not fit well, talk with your health care provider about getting a different one. Some common types of masks include: Full face masks, which fit over the mouth and nose. Nasal masks, which fit over the nose. Nasal pillow or prong masks, which fit into the nostrils. If you are using a mask that fits over your nose and you tend to breathe through your mouth, a chin strap may be applied to help keep your mouth closed. Use a skin barrier to protect your skin as told by your health care provider. Some CPAP and BIPAP  machines have alarms that may sound if the mask comes off or develops a leak. If you have trouble with the mask, it is very important that you talk with your health care provider about finding a way to make the mask easier to tolerate. Do not stop using the mask. There could be a negative impact on your health if you stop using the mask. Tips for using the machine Place your CPAP or BIPAP machine on a secure table or stand near an electrical outlet. Know where the on/off switch is on the machine. Follow instructions from your health care provider about how to set the pressure on your machine and when you should use it. Do not eat or drink while the CPAP or BIPAP machine is on. Food or fluids could get pushed into your lungs by the pressure of the CPAP or BIPAP. For home use, CPAP and BIPAP machines can be rented or purchased through home health care companies. Many different brands of machines are available. Renting a machine before purchasing may help you find out which particular machine works well for you. Your health insurance company may also decide which machine you may get. Keep the CPAP or BIPAP machine and attachments clean. Ask your health care provider for specific instructions. Check the humidifier if you have a dry stuffy nose or nosebleeds. Make sure it is working correctly. Follow these instructions at home: Take over-the-counter and prescription medicines only as told by your health care provider. Ask if you can take sinus medicine if your sinuses are blocked. Do not use any products that contain nicotine or tobacco. These products include cigarettes, chewing tobacco, and vaping devices, such as e-cigarettes. If you need help quitting, ask your health care provider. Keep all follow-up visits. This is important. Contact a health care provider if: You have redness or pressure sores on your head, face, mouth, or nose from the mask or head gear. You have trouble using the CPAP or BIPAP  machine. You cannot tolerate wearing the CPAP or BIPAP mask. Someone tells you that you snore even when wearing your CPAP or BIPAP. Get help right away if: You have trouble breathing. You feel confused. Summary CPAP and BIPAP are methods that use air pressure to keep your airways open and to help you breathe well. If you have trouble with the mask, it is very important that you talk with your health care provider about finding a way to make the mask easier to tolerate. Do not stop using the mask. There could be a negative impact to your health if you stop using the mask. Follow instructions from your health care provider about when to use the machine. This information is not intended to replace advice given to you by your health care provider. Make sure you discuss any questions you have with your health care provider. Document Revised: 02/15/2021 Document Reviewed: 06/17/2020 Elsevier Patient Education  [place].

## 2022-09-07 NOTE — Progress Notes (Signed)
$@PatientS$  ID: Clarence Skinner, male    DOB: [birth year], 62 y.o.   MRN: [id number]  Chief Complaint  Patient presents with   sleep consult    Prior sleep study- not wearing cpap.     Referring provider: Teodora Medici, DO  HPI: 62 year old male, current every day smoker. PMH significant for OSA, COPD, primary tonsillar squamous cell carcinoma, prostate cancer, GERD, gout, pre-diabetes.    09/07/2022 Patient referred by internal medicine for OSA work up. He has hx sleep apnea dx. He is having trouble staying asleep. Tosses and turns at night. He snores at night. We do not have a copy of his most recent sleep study. He is unsure where he had testing done, sounds like it was more than 4 years ago. He has two CPAP machines, not currently using either. He stopped wearing CPAP during pandemic. He tells me that wearing CPAP made him cough a lot. He could not sleep with it at night but was able to use it during the day. He is open to resuming treatment if needed. He would like to establish with DME company in [place]. Needs new supplies. Epworth score 9.   Allergies  Allergen Reactions   Chocolate Itching, Nausea Only and Shortness Of Breath    (large amounts) Other reaction(s): Nausea, Vomiting, Shortness of Breath / Dyspnea   Eggs Or Egg-Derived Products Itching, Nausea And Vomiting, Nausea Only and Shortness Of Breath   Milk (Cow) Shortness Of Breath   Benadryl [Diphenhydramine] Other (See Comments)    Altered mental status "makes me crazy and figety"   Milk-Related Compounds Nausea And Vomiting    Immunization History  Administered Date(s) Administered   Influenza-Unspecified 07/30/2022   PFIZER(Purple Top)SARS-COV-2 Vaccination 10/13/2019, 11/03/2019   Pfizer Covid-19 Vaccine Bivalent Booster 29yr & up 05/08/2021   Tdap 08/29/2022    Past Medical History:  Diagnosis Date   [place] [facility]) [place]    ED (erectile dysfunction)    GERD  (gastroesophageal reflux disease)    Gout    Hypertension    Multilevel degenerative disc disease    Pneumonia 11/29/2017   Prostate cancer ([facility]    Sleep apnea    CPAP   Tachycardia    Wears dentures    partial upper and lower    Tobacco History: Social History   Tobacco Use  Smoking Status Every Day   Packs/day: 1.00   Years: 30.00   Total pack years: 30.00   Types: Cigarettes  Smokeless Tobacco Former   Types: Snuff, Chew   Quit date: 12/05/1984  Tobacco Comments   since age 62  Ready to quit: Not Answered Counseling given: Not Answered Tobacco comments: since age 62  Outpatient Medications Prior to Visit  Medication Sig Dispense Refill   albuterol (PROVENTIL HFA;VENTOLIN HFA) 108 (90 Base) MCG/ACT inhaler Inhale into the lungs every 6 (six) hours as needed for wheezing or shortness of breath.     allopurinol (ZYLOPRIM) 300 MG tablet Take 300 mg by mouth daily.     amLODipine-benazepril (LOTREL) 5-20 MG capsule Take 1 capsule by mouth daily. 90 capsule 0   amoxicillin (AMOXIL) 875 MG tablet Take 875 mg by mouth 2 (two) times daily. Taking until Sunday     benzonatate (TESSALON) 100 MG capsule Take 1 capsule (100 mg total) by mouth every 8 (eight) hours as needed. 30 capsule 0   fluticasone (FLONASE) 50 MCG/ACT nasal spray Place into both nostrils daily as needed for allergies  or rhinitis.     Fluticasone-Umeclidin-Vilant (TRELEGY ELLIPTA) 100-62.5-25 MCG/ACT AEPB Trelegy Ellipta 100-62.5-25 MCG/INH Inhalation Aerosol Powder Breath Activated QTY: 90 each Days: 90 Refills: 1  Written: 11/02/20 Patient Instructions: One puff once a day     furosemide (LASIX) 20 MG tablet Take 20 mg by mouth as needed.     lidocaine-prilocaine (EMLA) cream Apply 1 Application topically as directed. Apply small amount of cream to port a cath site 1-2 hours prior to port being accessed. 30 g 2   loratadine (CLARITIN) 10 MG tablet Take 10 mg by mouth daily.     magic mouthwash  (multi-ingredient) oral suspension Swish and spit 5-10 mls four times a day as needed 480 mL 1   magic mouthwash w/lidocaine SOLN Take 5 mLs by mouth 4 (four) times daily as needed for mouth pain. Sig: Swish/Spit 5-10 ml four times a day as needed. Dispense 480 ml. 1RF 480 mL 1   pantoprazole (PROTONIX) 40 MG tablet Take 40 mg by mouth daily.     rosuvastatin (CRESTOR) 20 MG tablet Take 20 mg by mouth daily.     senna (SENOKOT) 8.6 MG TABS tablet Take 1 tablet (8.6 mg total) by mouth daily. 120 tablet 0   tadalafil (CIALIS) 20 MG tablet Take 1 tablet (20 mg total) by mouth daily. 30 tablet 11   Vitamin D, Ergocalciferol, (DRISDOL) 1.25 MG (50000 UNIT) CAPS capsule Take 50,000 Units by mouth once a week.     Facility-Administered Medications Prior to Visit  Medication Dose Route Frequency Provider Last Rate Last Admin   sodium chloride flush (NS) 0.9 % injection 10 mL  10 mL Intracatheter PRN [name], MD   10 mL at 04/11/22 1221    Review of Systems  Review of Systems  Constitutional: Negative.   HENT: Negative.    Respiratory: Negative.    Cardiovascular: Negative.     Physical Exam  BP 122/60 (BP Location: Left Arm)   Pulse 95   Temp 97.8 F (36.6 C) (Temporal)   Ht 5' 11"$  (1.803 m)   Wt 166 lb (75.3 kg)   SpO2 98%   BMI 23.15 kg/m  Physical Exam Constitutional:      Appearance: Normal appearance.  HENT:     Head: Normocephalic and atraumatic.     Mouth/Throat:     Mouth: Mucous membranes are moist.     Pharynx: Oropharynx is clear.  Cardiovascular:     Rate and Rhythm: Normal rate and regular rhythm.  Pulmonary:     Effort: Pulmonary effort is normal.     Breath sounds: Normal breath sounds.  Musculoskeletal:        General: Normal range of motion.  Skin:    General: Skin is warm and dry.  Neurological:     General: No focal deficit present.     Mental Status: He is alert and oriented to person, place, and time. Mental status is at baseline.  Psychiatric:         Mood and Affect: Mood normal.        Behavior: Behavior normal.        Thought Content: Thought content normal.        Judgment: Judgment normal.      Lab Results:  CBC    Component Value Date/Time   WBC 7.7 08/28/2022 1329   RBC 4.63 08/28/2022 1329   HGB 12.0 (L) 08/28/2022 1329   HGB 12.1 (L) 06/17/2013 1305   HCT 37.9 (L) 08/28/2022 1329  HCT 36.8 (L) 06/17/2013 1305   PLT 275 08/28/2022 1329   PLT 246 06/17/2013 1305   MCV 81.9 08/28/2022 1329   MCV 80 06/17/2013 1305   MCH 25.9 (L) 08/28/2022 1329   MCHC 31.7 08/28/2022 1329   RDW 14.2 08/28/2022 1329   RDW 16.8 (H) 06/17/2013 1305   LYMPHSABS 0.7 08/28/2022 1329   LYMPHSABS 2.5 06/17/2013 1305   MONOABS 0.8 08/28/2022 1329   MONOABS 0.7 06/17/2013 1305   EOSABS 0.1 08/28/2022 1329   EOSABS 0.2 06/17/2013 1305   BASOSABS 0.0 08/28/2022 1329   BASOSABS 0.1 06/17/2013 1305    BMET    Component Value Date/Time   NA 138 08/28/2022 1329   NA 138 10/03/2012 1843   K 3.4 (L) 08/28/2022 1329   K 3.5 10/03/2012 1843   CL 105 08/28/2022 1329   CL 106 10/03/2012 1843   CO2 23 08/28/2022 1329   CO2 27 10/03/2012 1843   GLUCOSE 138 (H) 08/28/2022 1329   GLUCOSE 134 (H) 10/03/2012 1843   BUN 19 08/28/2022 1329   BUN 10 10/03/2012 1843   CREATININE 1.56 (H) 08/28/2022 1329   CREATININE 1.04 10/03/2012 1843   CALCIUM 8.5 (L) 08/28/2022 1329   CALCIUM 8.5 10/03/2012 1843   GFRNONAA 51 (L) 08/28/2022 1329   GFRNONAA >60 10/03/2012 1843   GFRAA >60 11/29/2017 0719   GFRAA >60 10/03/2012 1843    BNP    Component Value Date/Time   BNP 14.2 02/26/2022 1504    ProBNP No results found for: "PROBNP"  Imaging: NM PET Image Restag (PS) Skull Base To Thigh  Result Date: 08/23/2022 CLINICAL DATA:  Subsequent treatment strategy for tonsillar squamous cell carcinoma. EXAM: NUCLEAR MEDICINE PET SKULL BASE TO THIGH TECHNIQUE: 9.3 mCi F-18 FDG was injected intravenously. Full-ring PET imaging was performed from the  skull base to thigh after the radiotracer. CT data was obtained and used for attenuation correction and anatomic localization. Fasting blood glucose: 88 mg/dl COMPARISON:  03/19/2022. FINDINGS: Mediastinal blood pool activity: SUV max 2.0 Liver activity: SUV max NA NECK: 8 mm left level 2 lymph node, SUV max 8.0, new. 1.9 x 2.3 cm residual right tonsillar nodule, SUV max 4.6, decreased from 1.4 x 2.1 cm and 19.0 previously. Right level 2 lymph node has decreased in size, now measuring 1.7 x 2.3 cm (2/48), SUV max 3.2, compared to 2.6 x 3.6 cm and 18.0 previously. Incidental CT findings: None. CHEST: No abnormal hypermetabolism. Incidental CT findings: Right IJ Port-A-Cath terminates in the low SVC. Coronary artery calcification. Heart size normal. No pericardial or pleural effusion. Centrilobular emphysema. ABDOMEN/PELVIS: No abnormal hypermetabolism. Incidental CT findings: Liver, gallbladder, adrenal glands, kidneys, spleen, pancreas, stomach and bowel are grossly unremarkable. Atherosclerotic calcification of the aorta. 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 SKELETON: No abnormal hypermetabolism. Incidental CT findings: None. IMPRESSION: 1. Response therapy as evidenced by decrease in size and hypermetabolism of a primary right tonsillar nodule and right level 2 adenopathy. 2. New 8 mm left level 2 hypermetabolic lymph node. Recommend attention on follow-up. 3. Aortic atherosclerosis (ICD10-I70.0). Coronary artery calcification. 4.  Emphysema (ICD10-J43.9). Electronically Signed   By: Lorin Picket M.D.   On: 08/23/2022 10:52     Assessment & Plan:   Obstructive sleep apnea - Hx sleep apnea. Difficulty tolerating CPAP in the past d/t cough. He stopped wearing several years ago. Continues to have restless sleep and snoring symptoms. He is open to resume therapy. He has two CPAP machines, needs new supplies. Requesting sleep  study from [facility]. Once we get this we can place an order for  him to establish with local DME company, if unable to obtain records he may need to return to lab for CPAP titration study. FU in 6 weeks or sooner if needed.   Chronic obstructive pulmonary disease (HCC) - Stable; Maintained on Trelegy 111mg daily. Using SABA < 1/month. He is not ready to quit smoking. Managed by primary care.    EMartyn Ehrich NP 09/07/2022

## 2022-09-07 NOTE — Telephone Encounter (Signed)
Lm for [facility]/ [phone] to request sleep study.

## 2022-09-07 NOTE — Progress Notes (Signed)
Reviewed and agree with assessment/plan.   Chesley Mires, MD [facility]monary/Critical Care 09/07/2022, 11:09 AM Pager:  [phone]

## 2022-09-07 NOTE — Assessment & Plan Note (Signed)
-   Stable; Maintained on Trelegy 149mg daily. Using SABA < 1/month. He is not ready to quit smoking. Managed by primary care.

## 2022-09-10 ENCOUNTER — Inpatient Hospital Stay (HOSPITAL_BASED_OUTPATIENT_CLINIC_OR_DEPARTMENT_OTHER): Payer: Medicaid Other | Admitting: Hospice and Palliative Medicine

## 2022-09-10 DIAGNOSIS — C099 Malignant neoplasm of tonsil, unspecified: Secondary | ICD-10-CM

## 2022-09-10 MED ORDER — MAGIC MOUTHWASH W/LIDOCAINE: 5.0000 mL | Freq: Four times a day (QID) | ORAL | 1 refills | Status: DC | PRN

## 2022-09-10 NOTE — Progress Notes (Signed)
Virtual Visit via Telephone Note  I connected with Clarence Skinner on 09/10/22 at  2:00 PM EST by telephone and verified that I am speaking with the correct person using two identifiers.  Location: Patient: Home Provider: Clinic   I discussed the limitations, risks, security and privacy concerns of performing an evaluation and management service by telephone and the availability of in person appointments. I also discussed with the patient that there may be a patient responsible charge related to this service. The patient expressed understanding and agreed to proceed.   History of Present Illness: Clarence Skinner is a 62 y.o. 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  Patient is on concurrent chemoradiation.   Observations/Objective: I called and spoke with patient by phone.  Patient saw Dr. Tasia Catchings recently with plan for follow-up CTs in March.  Patient says he is doing reasonably well.  He says he is swallowing some better but reports that he feels he has recurrent thrush.  He requests refill of his Magic mouthwash with nystatin.  Will send Rx to pharmacy.  Assessment and Plan: Stage IV right tonsillar squamous cell carcinoma -status post concurrent chemoradiation with partial response to treatment.  Repeat CT in March is pending.  Symptomatically well-controlled.  Will refill Magic mouthwash.  Follow Up Instructions: Follow-up telephone visit 2 months   I discussed the assessment and treatment plan with the patient. The patient was provided an opportunity to ask questions and all were answered. The patient agreed with the plan and demonstrated an understanding of the instructions.   The patient was advised to call back or seek an in-person evaluation if the symptoms worsen or if the condition fails to improve as anticipated.  I provided 5 minutes of non-face-to-face time  during this encounter.   Irean Hong, NP

## 2022-09-10 NOTE — Telephone Encounter (Signed)
Lm x2 for [facility] record department.  Record request also faxed to [facility] at [phone]

## 2022-09-11 ENCOUNTER — Telehealth: Payer: Self-pay | Admitting: *Deleted

## 2022-09-11 ENCOUNTER — Encounter: Payer: Medicaid Other | Admitting: Physical Therapy

## 2022-09-11 ENCOUNTER — Encounter: Payer: Self-pay | Admitting: Oncology

## 2022-09-11 ENCOUNTER — Other Ambulatory Visit: Payer: Self-pay

## 2022-09-11 MED ORDER — STERILE WATER FOR INJECTION IJ SOLN
5.0000 mL | Freq: Four times a day (QID) | OROMUCOSAL | 1 refills | Status: DC | PRN
  Filled 2022-09-11: qty 480, 12d supply, fill #0

## 2022-09-11 NOTE — Telephone Encounter (Signed)
Refaxed magic mouth wash to correct location- [facility]- fax conf. [id number]

## 2022-09-11 NOTE — Telephone Encounter (Signed)
Magic mouth wash faxed to pt's [facility] pharmacy

## 2022-09-11 NOTE — Telephone Encounter (Signed)
Sleep study received and faxed to [facility] office for [facility] to review.

## 2022-09-12 ENCOUNTER — Other Ambulatory Visit: Payer: Self-pay

## 2022-09-13 ENCOUNTER — Encounter: Payer: Medicaid Other | Admitting: Physical Therapy

## 2022-09-17 ENCOUNTER — Telehealth: Payer: Self-pay | Admitting: Primary Care

## 2022-09-17 NOTE — Telephone Encounter (Signed)
Patient states CPAP machine is [facility]. DME company is [employer]. Patient phone number is [phone].

## 2022-09-17 NOTE — Telephone Encounter (Signed)
Please refer to 09/07/2022 phone note.

## 2022-09-17 NOTE — Telephone Encounter (Signed)
He stated that his current machine is very old and he would like a new one.   Please advise if okay to order?

## 2022-09-17 NOTE — Telephone Encounter (Signed)
Ok sending in order for new machine auto 5-15cm h20

## 2022-09-17 NOTE — Telephone Encounter (Signed)
Patient is aware of results and voiced his understanding.  He would like to proceed with cpap. He would like local DME.  Beth, please advise on settings? Thank you

## 2022-09-17 NOTE — Telephone Encounter (Signed)
I thought he had a current machine CPAP machine. Would recommend auto settings 5-15cm h20. He will need to bring machine into DME and have it set/looked at

## 2022-09-17 NOTE — Telephone Encounter (Signed)
Received sleep study results.  Patient had sleep study on January 11, 2017 with [facility] in [place].  Patient has mild sleep disordered breathing, overall AHI 5 an hour and all sleep positions.  REM index was mild with average AHI 14 an hour.  Mean SpO2 92% with nadir of 86%.  Snoring was mild.  No significant periodic limb movements.  Please place an order for patient to establish with local DME company for CPAP supplies

## 2022-09-17 NOTE — Telephone Encounter (Signed)
Patient is aware of below message and voiced his understanding.  Order placed to [facility] per patient request.  He will call back to schedule OV once setup on machine.  Nothing further needed.

## 2022-09-18 ENCOUNTER — Encounter: Payer: Medicaid Other | Admitting: Physical Therapy

## 2022-09-18 ENCOUNTER — Encounter: Payer: Self-pay | Admitting: Registered Nurse

## 2022-09-20 ENCOUNTER — Encounter: Payer: Medicaid Other | Admitting: Physical Therapy

## 2022-09-21 DIAGNOSIS — Z419 Encounter for procedure for purposes other than remedying health state, unspecified: Secondary | ICD-10-CM | POA: Diagnosis not present

## 2022-09-24 NOTE — Progress Notes (Unsigned)
Established Patient Office Visit  Subjective    Patient ID: Clarence Skinner, male    DOB: 1961/05/26  Age: 62 y.o. MRN: [id number]  CC:  No chief complaint on file.   HPI Clarence Skinner presents to follow up on chronic medical conditions.   Hypertension/OSA: -Medications: Amlodipine-Benazepril decreased to 5-20 mg at LOV, Lasix 20 mg PRN leg swelling (has to take 1-2 a year). Discussed holding medication if BP <120/80 at LOV. Patient states that he has been holding his medication most days and will only take it about once a week when his blood pressure is high (148/88 was the highest) -Patient is compliant with above medications and reports no side effects. -Checking BP at home (average): Yes; 124-147/70-90 -Denies any SOB, CP, vision changes, LE edema  -Unable to tolerate CPAP machine   HLD: -Medications: Crestor 20 mg -Patient is compliant with above medications and reports no side effects.  -Last lipid panel: Lipid Panel     Component Value Date/Time   CHOL 152 08/01/2022 0933   TRIG 81 08/01/2022 0933   HDL 54 08/01/2022 0933   CHOLHDL 2.8 08/01/2022 0933   LDLCALC 82 08/01/2022 0933    COPD: -COPD status: stable -Current medications: Trelegy, Albuterol  -Satisfied with current treatment?: yes -Oxygen use: no -Dyspnea frequency: yes -Cough frequency: yes -Rescue inhaler frequency: monthly sometimes more -Limitation of activity: yes -Pneumovax: UTD - got at [facility] in [place]  -Influenza: Up to Date -Following with Pulmonology for OSA - CPAP ?  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 Planning on CT scan in March. -Following with hospice/palliative care, note from 09/10/22 reviewed.   History of Prostate cancer: -Following with Urology, note reviewed from 12/27/21 -First diagnosed  in 07/2018 -Underwent brachytherapy with seen implant 01/2019 -No urinary complaints -Last PSA 5/23 stable at 0.3  GERD: -Currently on Protonix 40 mg, no symptoms  Gout: -Currently Allopurinol 300 mg -Usually flares in the big toe -No flares since being on Alloprinil   Pre-Diabetes: -A1c 1/24 5.7% -Not currently on medications  Health Maintenance: -Blood work UTD -Lung cancer screening due. Referral placed, patient will call them back -Colon cancer screening due, will discuss at follow up    Outpatient Encounter Medications as of 09/26/2022  Medication Sig   albuterol (PROVENTIL HFA;VENTOLIN HFA) 108 (90 Base) MCG/ACT inhaler Inhale into the lungs every 6 (six) hours as needed for wheezing or shortness of breath.   allopurinol (ZYLOPRIM) 300 MG tablet Take 300 mg by mouth daily.   amLODipine-benazepril (LOTREL) 5-20 MG capsule Take 1 capsule by mouth daily.   amoxicillin (AMOXIL) 875 MG tablet Take 875 mg by mouth 2 (two) times daily. Taking until Sunday   benzonatate (TESSALON) 100 MG capsule Take 1 capsule (100 mg total) by mouth every 8 (eight) hours as needed.   fluticasone (FLONASE) 50 MCG/ACT nasal spray Place into both nostrils daily as needed for allergies or rhinitis.   Fluticasone-Umeclidin-Vilant (TRELEGY ELLIPTA) 100-62.5-25 MCG/ACT AEPB Trelegy Ellipta 100-62.5-25 MCG/INH Inhalation Aerosol Powder Breath Activated QTY: 90 each Days: 90 Refills: 1  Written: 11/02/20 Patient Instructions: One puff once a day   furosemide (LASIX) 20 MG tablet Take 20 mg by mouth as needed.   lidocaine-prilocaine (EMLA) cream Apply 1 Application topically as directed. Apply small amount of cream to port a cath site 1-2 hours prior to port  being accessed.   loratadine (CLARITIN) 10 MG tablet Take 10 mg by mouth daily.   magic mouthwash (multi-ingredient) oral suspension Swish and spit 5-10 mls four times a day as needed   magic mouthwash (multi-ingredient) oral suspension Swish and spit 5-10  mLs by mouth 4 (four) times daily as needed.   magic mouthwash w/lidocaine SOLN Take 5 mLs by mouth 4 (four) times daily as needed for mouth pain. Sig: Swish/Spit 5-10 ml four times a day as needed. Dispense 480 ml. 1RF   pantoprazole (PROTONIX) 40 MG tablet Take 40 mg by mouth daily.   rosuvastatin (CRESTOR) 20 MG tablet Take 20 mg by mouth daily.   senna (SENOKOT) 8.6 MG TABS tablet Take 1 tablet (8.6 mg total) by mouth daily.   tadalafil (CIALIS) 20 MG tablet Take 1 tablet (20 mg total) by mouth daily.   Vitamin D, Ergocalciferol, (DRISDOL) 1.25 MG (50000 UNIT) CAPS capsule Take 50,000 Units by mouth once a week.   Facility-Administered Encounter Medications as of 09/26/2022  Medication   sodium chloride flush (NS) 0.9 % injection 10 mL    Past Medical History:  Diagnosis Date   COPD (chronic obstructive pulmonary disease) ([place])    ED (erectile dysfunction)    GERD (gastroesophageal reflux disease)    Gout    Hypertension    Multilevel degenerative disc disease    Pneumonia 11/29/2017   Prostate cancer ([place])    Sleep apnea    CPAP   Tachycardia    Wears dentures    partial upper and lower    Past Surgical History:  Procedure Laterality Date   ABCESS DRAINAGE  2009   tonsil   COLONOSCOPY WITH PROPOFOL N/A 02/11/2018   Procedure: COLONOSCOPY WITH PROPOFOL;  Surgeon: Toledo, Benay Pike, MD;  Location: [facility];  Service: Endoscopy;  Laterality: N/A;   ESOPHAGOGASTRODUODENOSCOPY (EGD) WITH PROPOFOL N/A 02/11/2018   Procedure: ESOPHAGOGASTRODUODENOSCOPY (EGD) WITH PROPOFOL;  Surgeon: Toledo, Benay Pike, MD;  Location: [facility];  Service: Endoscopy;  Laterality: N/A;   IR IMAGING GUIDED PORT INSERTION  04/09/2022   MASS EXCISION Right 08/14/2017   Procedure: EXCISION RIGHT TEMPORAL MASS SUBMENTAL MASS;  Surgeon: Carloyn Manner, MD;  Location: [place];  Service: ENT;  Laterality: Right;  sleep apnea   RADIOACTIVE SEED IMPLANT N/A 01/26/2019   Procedure:  RADIOACTIVE SEED IMPLANT/BRACHYTHERAPY IMPLANT;  Surgeon: Billey Co, MD;  Location: [facility];  Service: Urology;  Laterality: N/A;   SEPTOPLASTY  2016   [place]   VOLUME STUDY N/A 12/29/2018   Procedure: VOLUME STUDY;  Surgeon: Billey Co, MD;  Location: [facility];  Service: Urology;  Laterality: N/A;    Family History  Problem Relation Age of Onset   Anxiety disorder Mother    Cancer Father    Hypertension Sister    Diabetes Sister    Asthma Sister    Other Brother        mva  at age 70   Gout Brother    Hypertension Brother    Prostate cancer Maternal Uncle    Prostate cancer Maternal Uncle     Social History   Socioeconomic History   Marital status: Significant Other    Spouse name: Not on file   Number of children: Not on file   Years of education: Not on file   Highest education level: Not on file  Occupational History   Not on file  Tobacco Use   Smoking status: Every Day  Packs/day: 1.00    Years: 30.00    Total pack years: 30.00    Types: Cigarettes   Smokeless tobacco: Former    Types: Snuff, Chew    Quit date: 12/05/1984   Tobacco comments:    since age 72  Vaping Use   Vaping Use: Never used  Substance and Sexual Activity   Alcohol use: Not Currently    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   Drug use: Not Currently   Sexual activity: Yes  Other Topics Concern   Not on file  Social History Narrative   Not on file   Social Determinants of Health   Financial Resource Strain: High Risk (04/03/2022)   Overall Financial Resource Strain (CARDIA)    Difficulty of Paying Living Expenses: Hard  Food Insecurity: Food Insecurity Present (04/03/2022)   Hunger Vital Sign    Worried About Running Out of Food in the Last Year: Often true    Ran Out of Food in the Last Year: Often true  Transportation Needs: Unmet Transportation Needs (04/03/2022)   PRAPARE - Hydrologist (Medical): Yes    Lack of  Transportation (Non-Medical): Yes  Physical Activity: Inactive (04/03/2022)   Exercise Vital Sign    Days of Exercise per Week: 0 days    Minutes of Exercise per Session: 0 min  Stress: Stress Concern Present (04/03/2022)   [place]    Feeling of Stress : Very much  Social Connections: Moderately Isolated (04/03/2022)   Social Connection and Isolation Panel [NHANES]    Frequency of Communication with Friends and Family: Never    Frequency of Social Gatherings with Friends and Family: Three times a week    Attends Religious Services: Never    Active Member of Clubs or Organizations: No    Attends Archivist Meetings: Not on file    Marital Status: Living with partner  Intimate Partner Violence: Not At Risk (04/03/2022)   Humiliation, Afraid, Rape, and Kick questionnaire    Fear of Current or Ex-Partner: No    Emotionally Abused: No    Physically Abused: No    Sexually Abused: No    Review of Systems  Constitutional:  Negative for chills and fever.  Respiratory:  Negative for cough, shortness of breath and wheezing.   Cardiovascular:  Negative for chest pain.  Gastrointestinal:  Negative for abdominal pain.      Objective    There were no vitals taken for this visit.  Physical Exam Constitutional:      Appearance: Normal appearance.  HENT:     Head: Normocephalic and atraumatic.  Eyes:     Conjunctiva/sclera: Conjunctivae normal.  Cardiovascular:     Rate and Rhythm: Normal rate and regular rhythm.  Pulmonary:     Effort: Pulmonary effort is normal.     Breath sounds: Normal breath sounds.  Musculoskeletal:     Right lower leg: No edema.     Left lower leg: No edema.  Skin:    General: Skin is warm and dry.  Neurological:     General: No focal deficit present.     Mental Status: He is alert. Mental status is at baseline.  Psychiatric:        Mood and Affect: Mood normal.        Behavior:  Behavior normal.     Last CBC Lab Results  Component Value Date   WBC 7.7  08/28/2022   HGB 12.0 (L) 08/28/2022   HCT 37.9 (L) 08/28/2022   MCV 81.9 08/28/2022   MCH 25.9 (L) 08/28/2022   RDW 14.2 08/28/2022   PLT 275 0000000   Last metabolic panel Lab Results  Component Value Date   GLUCOSE 138 (H) 08/28/2022   NA 138 08/28/2022   K 3.4 (L) 08/28/2022   CL 105 08/28/2022   CO2 23 08/28/2022   BUN 19 08/28/2022   CREATININE 1.56 (H) 08/28/2022   GFRNONAA 51 (L) 08/28/2022   CALCIUM 8.5 (L) 08/28/2022   PROT 6.9 08/28/2022   ALBUMIN 3.7 08/28/2022   BILITOT 0.2 (L) 08/28/2022   ALKPHOS 70 08/28/2022   AST 12 (L) 08/28/2022   ALT 10 08/28/2022   ANIONGAP 10 08/28/2022   Last lipids Lab Results  Component Value Date   CHOL 152 08/01/2022   HDL 54 08/01/2022   LDLCALC 82 08/01/2022   TRIG 81 08/01/2022   CHOLHDL 2.8 08/01/2022   Last hemoglobin A1c Lab Results  Component Value Date   HGBA1C 5.7 (H) 08/01/2022   Last thyroid functions Lab Results  Component Value Date   TSH 0.978 07/05/2022   Last vitamin D No results found for: "25OHVITD2", "25OHVITD3", "VD25OH" Last vitamin B12 and Folate No results found for: "VITAMINB12", "FOLATE"      Assessment & Plan:   1. Essential hypertension: Patient has been holding his BP medication nearly daily, will decrease dose to Amlodipine-Benazepril 5-20 mg daily. Continue to monitor at home and still hold meds if BP <120/80 to avoid hypotension. Recheck in 1 month.  - amLODipine-benazepril (LOTREL) 5-20 MG capsule; Take 1 capsule by mouth daily.  Dispense: 90 capsule; Refill: 0  2. Mixed hyperlipidemia: Reviewed lipid panel with the patient from last month, doing well with Crestor 20 mg.  3. 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[place]): Following with Oncology, note and labs from 08/28/22 reviewed and discussed with the patient.   4. Need for Tdap vaccination: Tdap administered.   - Tdap vaccine greater than  or equal to 7yo IM   No follow-ups on file.   Teodora Medici, DO

## 2022-09-25 ENCOUNTER — Encounter: Payer: Medicaid Other | Admitting: Physical Therapy

## 2022-09-26 ENCOUNTER — Encounter: Payer: Self-pay | Admitting: Internal Medicine

## 2022-09-26 ENCOUNTER — Ambulatory Visit: Payer: Medicaid Other | Admitting: Internal Medicine

## 2022-09-26 VITALS — BP 116/66 | HR 91 | Resp 16 | Ht 71.0 in | Wt 171.0 lb

## 2022-09-26 DIAGNOSIS — J3489 Other specified disorders of nose and nasal sinuses: Secondary | ICD-10-CM

## 2022-09-26 DIAGNOSIS — I10 Essential (primary) hypertension: Secondary | ICD-10-CM

## 2022-09-26 DIAGNOSIS — C099 Malignant neoplasm of tonsil, unspecified: Secondary | ICD-10-CM | POA: Diagnosis not present

## 2022-09-26 DIAGNOSIS — Z7689 Persons encountering health services in other specified circumstances: Secondary | ICD-10-CM | POA: Diagnosis not present

## 2022-09-26 MED ORDER — METHYLPREDNISOLONE 4 MG PO TBPK: ORAL_TABLET | ORAL | 0 refills | Status: DC

## 2022-09-26 NOTE — Patient Instructions (Signed)
It was great seeing you today!  Plan discussed at today's visit: -Discontinue blood pressure medication - take only if BP >140/90  -Medrol dose pack (steroid) sent to pharmacy   Follow up in: 3 months   Take care and let us know if you have any questions or concerns prior to your next visit.  Dr. Rosana Berger

## 2022-09-27 ENCOUNTER — Encounter: Payer: Medicaid Other | Admitting: Physical Therapy

## 2022-10-02 ENCOUNTER — Ambulatory Visit
Admission: RE | Admit: 2022-10-02 | Discharge: 2022-10-02 | Disposition: A | Discharge status: Home / Self Care | Payer: Medicaid Other | Source: Ambulatory Visit | Attending: Radiation Oncology | Admitting: Radiation Oncology

## 2022-10-02 DIAGNOSIS — C099 Malignant neoplasm of tonsil, unspecified: Secondary | ICD-10-CM | POA: Diagnosis not present

## 2022-10-02 DIAGNOSIS — Z7689 Persons encountering health services in other specified circumstances: Secondary | ICD-10-CM | POA: Diagnosis not present

## 2022-10-02 MED ORDER — IOHEXOL 300 MG/ML  SOLN
75.0000 mL | Freq: Once | INTRAMUSCULAR | Status: AC | PRN
  Administered 2022-10-02: 75 mL via INTRAVENOUS

## 2022-10-04 ENCOUNTER — Encounter: Payer: Medicaid Other | Admitting: Physical Therapy

## 2022-10-05 DIAGNOSIS — C148 Malignant neoplasm of overlapping sites of lip, oral cavity and pharynx: Secondary | ICD-10-CM | POA: Diagnosis not present

## 2022-10-05 DIAGNOSIS — R1314 Dysphagia, pharyngoesophageal phase: Secondary | ICD-10-CM | POA: Diagnosis not present

## 2022-10-05 DIAGNOSIS — Z7689 Persons encountering health services in other specified circumstances: Secondary | ICD-10-CM | POA: Diagnosis not present

## 2022-10-09 ENCOUNTER — Encounter: Payer: Medicaid Other | Admitting: Physical Therapy

## 2022-10-10 ENCOUNTER — Encounter: Payer: Self-pay | Admitting: Radiation Oncology

## 2022-10-10 ENCOUNTER — Ambulatory Visit
Admission: RE | Admit: 2022-10-10 | Discharge: 2022-10-10 | Disposition: A | Discharge status: Home / Self Care | Payer: Medicaid Other | Source: Ambulatory Visit | Attending: Radiation Oncology | Admitting: Radiation Oncology

## 2022-10-10 VITALS — BP 118/71 | HR 100 | Temp 97.6°F | Resp 20 | Ht 71.0 in | Wt 173.0 lb

## 2022-10-10 DIAGNOSIS — C099 Malignant neoplasm of tonsil, unspecified: Secondary | ICD-10-CM

## 2022-10-10 DIAGNOSIS — Z923 Personal history of irradiation: Secondary | ICD-10-CM | POA: Insufficient documentation

## 2022-10-10 DIAGNOSIS — Z7689 Persons encountering health services in other specified circumstances: Secondary | ICD-10-CM | POA: Diagnosis not present

## 2022-10-10 DIAGNOSIS — Z9221 Personal history of antineoplastic chemotherapy: Secondary | ICD-10-CM | POA: Insufficient documentation

## 2022-10-10 NOTE — Progress Notes (Signed)
Radiation Oncology Follow up Note  Name: Clarence Skinner   Date:   10/10/2022 MRN:  [id number] DOB: 03-21-1961    This 62 y.o. 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  Seen today in routine follow-up he is doing well clinically.  Specifically denies any head and neck pain or dysphagia.  We have been tracking an area on.  PET CT scan showing an 8 mm level 2 hypermetabolic lymph node.  That PET scan was back in January repeated his CT scan of soft tissue of head and neck this month showing again enlarged left lateral retropharyngeal lymph node site of hypermetabolic activity on recent PET scan.  He is seeing Dr. Diona Foley and question is the seems to be referred to do for a biopsy.  COMPLICATIONS OF TREATMENT: none  FOLLOW UP COMPLIANCE: keeps appointments   PHYSICAL EXAM:  BP 118/71   Pulse 100   Temp 97.6 F (36.4 C) (Tympanic)   Resp 20   Ht 5\' 11"  (1.803 m) Comment: stated HT  Wt 173 lb (78.5 kg)   BMI 24.13 kg/m  Oral cavity is clear no evidence of adenopathy on physical exam of the cervical or supraclavicular region.  Well-developed well-nourished patient in NAD. HEENT reveals PERLA, EOMI, discs not visualized.  Oral cavity is clear. No oral mucosal lesions are identified. Neck is clear without evidence of cervical or supraclavicular adenopathy. Lungs are clear to A&P. Cardiac examination is essentially unremarkable with regular rate and rhythm without murmur rub or thrill. Abdomen is benign with no organomegaly or masses noted. Motor sensory and DTR levels are equal and symmetric in the upper and lower extremities. Cranial nerves II  through XII are grossly intact. Proprioception is intact. No peripheral adenopathy or edema is identified. No motor or sensory levels are noted. Crude visual fields are within normal range.  RADIOLOGY RESULTS: CT scans and PET CT scans reviewed  PLAN: This time I will get in touch with ENT and try to help arrange biopsy of this hypermetabolic node.  Difficult area to resect although could offer salvage radiation therapy should biopsy be positive.  Our own IR department may be able assisted biopsy.  Will contact Dr. Pryor Ochoa through secure chat.  I would like to take this opportunity to thank you for allowing me to participate in the care of your patient.Noreene Filbert, MD

## 2022-10-11 ENCOUNTER — Encounter: Payer: Medicaid Other | Admitting: Physical Therapy

## 2022-10-11 ENCOUNTER — Other Ambulatory Visit: Payer: Self-pay | Admitting: Internal Medicine

## 2022-10-11 ENCOUNTER — Telehealth: Payer: Self-pay

## 2022-10-11 ENCOUNTER — Other Ambulatory Visit: Payer: Self-pay | Admitting: Radiation Oncology

## 2022-10-11 DIAGNOSIS — M25552 Pain in left hip: Secondary | ICD-10-CM

## 2022-10-11 DIAGNOSIS — I10 Essential (primary) hypertension: Secondary | ICD-10-CM

## 2022-10-11 NOTE — Telephone Encounter (Signed)
Rx 08/29/22 #90- too soon Requested Prescriptions  Pending Prescriptions Disp Refills   amLODipine-benazepril (LOTREL) 5-20 MG capsule [Pharmacy Med Name: AMLODIPINE-BENAZEPRIL 5-20 MG] 90 capsule 0    Sig: TAKE 1 CAPSULE BY MOUTH EVERY DAY     Cardiovascular: CCB + ACEI Combos Failed - 10/11/2022 10:28 AM      Failed - Cr in normal range and within 180 days    Creatinine  Date Value Ref Range Status  10/03/2012 1.04 0.60 - 1.30 mg/dL Final   Creatinine, Ser  Date Value Ref Range Status  08/28/2022 1.56 (H) 0.61 - 1.24 mg/dL Final         Failed - K in normal range and within 180 days    Potassium  Date Value Ref Range Status  08/28/2022 3.4 (L) 3.5 - 5.1 mmol/L Final  10/03/2012 3.5 3.5 - 5.1 mmol/L Final         Passed - Na in normal range and within 180 days    Sodium  Date Value Ref Range Status  08/28/2022 138 135 - 145 mmol/L Final  10/03/2012 138 136 - 145 mmol/L Final         Passed - eGFR is 30 or above and within 180 days    EGFR (African American)  Date Value Ref Range Status  10/03/2012 >60  Final   GFR calc Af Amer  Date Value Ref Range Status  11/29/2017 >60 >60 mL/min Final    Comment:    (NOTE) The eGFR has been calculated using the CKD EPI equation. This calculation has not been validated in all clinical situations. eGFR's persistently <60 mL/min signify possible Chronic Kidney Disease.    EGFR (Non-African Amer.)  Date Value Ref Range Status  10/03/2012 >60  Final    Comment:    eGFR values <26mL/min/1.73 m2 may be an indication of chronic kidney disease (CKD). Calculated eGFR is useful in patients with stable renal function. The eGFR calculation will not be reliable in acutely ill patients when serum creatinine is changing rapidly. It is not useful in  patients on dialysis. The eGFR calculation may not be applicable to patients at the low and high extremes of body sizes, pregnant women, and vegetarians.    GFR, Estimated  Date Value Ref  Range Status  08/28/2022 51 (L) >60 mL/min Final    Comment:    (NOTE) Calculated using the CKD-EPI Creatinine Equation (2021)          Passed - Patient is not pregnant      Passed - Last BP in normal range    BP Readings from Last 1 Encounters:  10/10/22 118/71         Passed - Valid encounter within last 6 months    Recent Outpatient Visits           2 weeks ago Essential hypertension   [place], DO   1 month ago Essential hypertension   [place], DO   2 months ago Chronic obstructive pulmonary disease, unspecified COPD type [facility])   [facility] Teodora Medici, DO       Future Appointments             In 2 months Teodora Medici, [facility], [facility]

## 2022-10-11 NOTE — Telephone Encounter (Signed)
Spoke to [place] with [employer]. He stated that due to non compliance with cpap, insurance requires new sleep study for new machine and supplies.

## 2022-10-12 ENCOUNTER — Encounter: Payer: Self-pay | Admitting: Primary Care

## 2022-10-12 ENCOUNTER — Ambulatory Visit (INDEPENDENT_AMBULATORY_CARE_PROVIDER_SITE_OTHER): Payer: Medicaid Other | Admitting: Primary Care

## 2022-10-12 VITALS — BP 118/62 | HR 100 | Temp 98.0°F | Ht 71.0 in | Wt 172.2 lb

## 2022-10-12 DIAGNOSIS — J449 Chronic obstructive pulmonary disease, unspecified: Secondary | ICD-10-CM | POA: Diagnosis not present

## 2022-10-12 DIAGNOSIS — Z7689 Persons encountering health services in other specified circumstances: Secondary | ICD-10-CM | POA: Diagnosis not present

## 2022-10-12 DIAGNOSIS — G4733 Obstructive sleep apnea (adult) (pediatric): Secondary | ICD-10-CM | POA: Diagnosis not present

## 2022-10-12 NOTE — Assessment & Plan Note (Signed)
-   Hx mild sleep apnea, PSG 01/11/17>> total AHI 5/hour (REM index 14/hour). Mean SpO2 92% with nadir of 86%. - He is needing new CPAP machine and supplies  - Due to break in therapy he will need repeat sleep study - FU in 3 months or sooner if needed

## 2022-10-12 NOTE — Assessment & Plan Note (Deleted)
-   Hx sleep apnea, not currently on CPAP.  - Sleep study 01/11/2017 with [facility] in [facility] showed mild sleep disordered breathing, overall AHI 5/hour (REM index AHI 14 an hour) - Patient stopped wearing CPAP several years ago. He continues to having symptoms of snoring, sleep disruption and daytime sleepiness. He is open to resuming CPAP. He will need updated sleep study in order to received new CPAP machine/supplies. We have placed an order for patient to have home sleep study. After completing sleep study we can place DME order for him to get new CPAP machine

## 2022-10-12 NOTE — Assessment & Plan Note (Signed)
-   Stable; No acute respiratory symptoms or active dyspnea  - Continue Trelegy 117mcg one puff daily  - Managed by primary care

## 2022-10-12 NOTE — Patient Instructions (Addendum)
  Orders: Home sleep study  Follow-up Call or message to get sleep study results  3 months with [facility]

## 2022-10-12 NOTE — Progress Notes (Signed)
@Patient  ID: Clarence Skinner, male    DOB: Sep 15, 1960, 62 y.o.   MRN: [id number]  Chief Complaint  Patient presents with   Follow-up    Not wearing cpap. Needs repeat study.    Referring provider: Teodora Medici, DO  HPI: 62 year old male, current every day smoker. PMH significant for OSA, COPD, primary tonsillar squamous cell carcinoma, prostate cancer, GERD, gout, pre-diabetes.    10/12/2022 - interim hx  Patient presents today for OSA follow-up.  Patient has a history of sleep apnea, difficulty tolerating CPAP in the past due to cough.  He stopped wearing CPAP several years ago.  He continues to have restless sleep and snoring symptoms. Patient had sleep study on January 11, 2017 with [facility] in [place].  Patient has mild sleep disordered breathing, overall AHI 5 an hour and all sleep positions.  REM index was mild with average AHI 14 an hour.  Mean SpO2 92% with nadir of 86%.  He continues to have snoring symptoms. He wakes up choking/cough at night. Associated daytime sleepiness. Typical bedtime 5-7pm. He falls asleep without difficulty. He wakes up twice a night to use the restroom. He starts his day 8:30am. He naps throughout the day. He is not working, gets bored easily during the day. He would like to be more active.    Allergies  Allergen Reactions   Chocolate Itching, Nausea Only and Shortness Of Breath    (large amounts) Other reaction(s): Nausea, Vomiting, Shortness of Breath / Dyspnea   Egg-Derived Products Itching, Nausea And Vomiting, Nausea Only and Shortness Of Breath   Milk (Cow) Shortness Of Breath   Benadryl [Diphenhydramine] Other (See Comments)    Altered mental status "makes me crazy and figety"   Milk-Related Compounds Nausea And Vomiting    Immunization History  Administered Date(s) Administered   Influenza-Unspecified 07/30/2022   PFIZER(Purple Top)SARS-COV-2 Vaccination 10/13/2019, 11/03/2019   Pfizer Covid-19  Vaccine Bivalent Booster 58yrs & up 05/08/2021   Tdap 08/29/2022    Past Medical History:  Diagnosis Date   COPD (chronic obstructive pulmonary disease) ([place])    ED (erectile dysfunction)    GERD (gastroesophageal reflux disease)    Gout    Hypertension    Multilevel degenerative disc disease    Pneumonia 11/29/2017   Prostate cancer ([place])    Sleep apnea    CPAP   Tachycardia    Wears dentures    partial upper and lower    Tobacco History: Social History   Tobacco Use  Smoking Status Every Day   Packs/day: 1.00   Years: 40.00   Additional pack years: 0.00   Total pack years: 40.00   Types: Cigarettes  Smokeless Tobacco Former   Types: Snuff, Chew   Quit date: 12/05/1984  Tobacco Comments   since age 37   Ready to quit: Not Answered Counseling given: Not Answered Tobacco comments: since age 89   Outpatient Medications Prior to Visit  Medication Sig Dispense Refill   albuterol (PROVENTIL HFA;VENTOLIN HFA) 108 (90 Base) MCG/ACT inhaler Inhale into the lungs every 6 (six) hours as needed for wheezing or shortness of breath.     allopurinol (ZYLOPRIM) 300 MG tablet Take 300 mg by mouth daily.     amLODipine-benazepril (LOTREL) 5-20 MG capsule Take 1 capsule by mouth daily. (Patient taking differently: Take 1 capsule by mouth as needed.) 90 capsule 0   Fluticasone-Umeclidin-Vilant (TRELEGY ELLIPTA) 100-62.5-25 MCG/ACT AEPB Trelegy Ellipta 100-62.5-25 MCG/INH Inhalation Aerosol Powder Breath Activated QTY:  90 each Days: 90 Refills: 1  Written: 11/02/20 Patient Instructions: One puff once a day     furosemide (LASIX) 20 MG tablet Take 20 mg by mouth as needed.     lidocaine-prilocaine (EMLA) cream Apply 1 Application topically as directed. Apply small amount of cream to port a cath site 1-2 hours prior to port being accessed. 30 g 2   loratadine (CLARITIN) 10 MG tablet Take 10 mg by mouth daily.     magic mouthwash w/lidocaine SOLN Take 5 mLs by mouth 4 (four) times daily as  needed for mouth pain. Sig: Swish/Spit 5-10 ml four times a day as needed. Dispense 480 ml. 1RF 480 mL 1   pantoprazole (PROTONIX) 40 MG tablet Take 40 mg by mouth daily.     rosuvastatin (CRESTOR) 20 MG tablet Take 20 mg by mouth daily.     tadalafil (CIALIS) 20 MG tablet Take 1 tablet (20 mg total) by mouth daily. 30 tablet 11   Vitamin D, Ergocalciferol, (DRISDOL) 1.25 MG (50000 UNIT) CAPS capsule Take 50,000 Units by mouth once a week.     meloxicam (MOBIC) 7.5 MG tablet TAKE 1 TABLET BY MOUTH EVERY DAY (Patient not taking: Reported on 10/12/2022) 90 tablet 2   Facility-Administered Medications Prior to Visit  Medication Dose Route Frequency Provider Last Rate Last Admin   sodium chloride flush (NS) 0.9 % injection 10 mL  10 mL Intracatheter PRN [name], MD   10 mL at 04/11/22 1221   Review of Systems  Review of Systems  Constitutional: Negative.   HENT: Negative.    Respiratory: Negative.  Negative for shortness of breath.   Cardiovascular: Negative.    Physical Exam  BP 118/62 (BP Location: Left Arm, Cuff Size: Normal)   Pulse 100   Temp 98 F (36.7 C) (Temporal)   Ht 5\' 11"  (1.803 m)   Wt 172 lb 3.2 oz (78.1 kg)   SpO2 96%   BMI 24.02 kg/m  Physical Exam Constitutional:      Appearance: Normal appearance.  HENT:     Head: Normocephalic and atraumatic.  Cardiovascular:     Rate and Rhythm: Normal rate and regular rhythm.  Pulmonary:     Effort: Pulmonary effort is normal.     Breath sounds: Normal breath sounds.  Skin:    General: Skin is warm and dry.  Neurological:     General: No focal deficit present.     Mental Status: He is alert and oriented to person, place, and time. Mental status is at baseline.  Psychiatric:        Mood and Affect: Mood normal.        Behavior: Behavior normal.        Thought Content: Thought content normal.        Judgment: Judgment normal.      Lab Results:  CBC    Component Value Date/Time   WBC 7.7 08/28/2022 1329   RBC  4.63 08/28/2022 1329   HGB 12.0 (L) 08/28/2022 1329   HGB 12.1 (L) 06/17/2013 1305   HCT 37.9 (L) 08/28/2022 1329   HCT 36.8 (L) 06/17/2013 1305   PLT 275 08/28/2022 1329   PLT 246 06/17/2013 1305   MCV 81.9 08/28/2022 1329   MCV 80 06/17/2013 1305   MCH 25.9 (L) 08/28/2022 1329   MCHC 31.7 08/28/2022 1329   RDW 14.2 08/28/2022 1329   RDW 16.8 (H) 06/17/2013 1305   LYMPHSABS 0.7 08/28/2022 1329   LYMPHSABS 2.5 06/17/2013  1305   MONOABS 0.8 08/28/2022 1329   MONOABS 0.7 06/17/2013 1305   EOSABS 0.1 08/28/2022 1329   EOSABS 0.2 06/17/2013 1305   BASOSABS 0.0 08/28/2022 1329   BASOSABS 0.1 06/17/2013 1305    BMET    Component Value Date/Time   NA 138 08/28/2022 1329   NA 138 10/03/2012 1843   K 3.4 (L) 08/28/2022 1329   K 3.5 10/03/2012 1843   CL 105 08/28/2022 1329   CL 106 10/03/2012 1843   CO2 23 08/28/2022 1329   CO2 27 10/03/2012 1843   GLUCOSE 138 (H) 08/28/2022 1329   GLUCOSE 134 (H) 10/03/2012 1843   BUN 19 08/28/2022 1329   BUN 10 10/03/2012 1843   CREATININE 1.56 (H) 08/28/2022 1329   CREATININE 1.04 10/03/2012 1843   CALCIUM 8.5 (L) 08/28/2022 1329   CALCIUM 8.5 10/03/2012 1843   GFRNONAA 51 (L) 08/28/2022 1329   GFRNONAA >60 10/03/2012 1843   GFRAA >60 11/29/2017 0719   GFRAA >60 10/03/2012 1843    BNP    Component Value Date/Time   BNP 14.2 02/26/2022 1504    ProBNP No results found for: "PROBNP"  Imaging: CT Soft Tissue Neck W Contrast  Result Date: 10/04/2022 CLINICAL DATA:  Head neck cancer, assess treatment response EXAM: CT NECK WITH CONTRAST TECHNIQUE: Multidetector CT imaging of the neck was performed using the standard protocol following the bolus administration of intravenous contrast. RADIATION DOSE REDUCTION: This exam was performed according to the departmental dose-optimization program which includes automated exposure control, adjustment of the mA and/or kV according to patient size and/or use of iterative reconstruction technique.  CONTRAST:  57mL OMNIPAQUE IOHEXOL 300 MG/ML  SOLN COMPARISON:  08/22/2022 PET-CT and MRI of the neck 02/27/2022 FINDINGS: Pharynx and larynx: Submucosal low-density in the supraglottic larynx asymmetrically affecting the right. Chronic deep extension of the right-sided hyoid bone compared to the left, no bony erosion. No discrete mass is seen. Salivary glands: No evidence of mass or inflammation. Post treatment changes bilaterally. Thyroid: Normal Lymph nodes: 13 mm rounded and mildly heterogeneous left lateral retropharyngeal lymph node, hypermetabolic on prior PET. Right jugular chain lymphadenopathy has regressed with non masslike architectural distortion and soft tissue density. Vascular: Atheromatous calcification. Limited intracranial: Negative Visualized orbits: Negative Mastoids and visualized paranasal sinuses: Clear Skeleton: No acute or erosive finding. Upper chest: Clear apical lungs where there is emphysema. IMPRESSION: Enlarged left lateral retropharyngeal lymph node, site of hypermetabolism on recent PET, a site of likely residual disease. NIRADS 3 by PET. Electronically Signed   By: Jorje Guild M.D.   On: 10/04/2022 04:40     Assessment & Plan:   Obstructive sleep apnea - Hx mild sleep apnea, PSG 01/11/17>> total AHI 5/hour (REM index 14/hour). Mean SpO2 92% with nadir of 86%. - He is needing new CPAP machine and supplies  - Due to break in therapy he will need repeat sleep study - FU in 3 months or sooner if needed   Chronic obstructive pulmonary disease ([place]) - Stable; No acute respiratory symptoms or active dyspnea  - Continue Trelegy 126mcg one puff daily  - Managed by primary care   Martyn Ehrich, NP 10/12/2022

## 2022-10-12 NOTE — Progress Notes (Signed)
Reviewed and agree with assessment/plan.   Chesley Mires, MD [facility] Pulmonary/Critical Care 10/12/2022, 2:06 PM Pager:  [phone]

## 2022-10-15 ENCOUNTER — Inpatient Hospital Stay: Payer: Medicaid Other | Attending: Oncology

## 2022-10-15 ENCOUNTER — Encounter: Payer: Self-pay | Admitting: Oncology

## 2022-10-15 ENCOUNTER — Inpatient Hospital Stay (HOSPITAL_BASED_OUTPATIENT_CLINIC_OR_DEPARTMENT_OTHER): Payer: Medicaid Other | Admitting: Oncology

## 2022-10-15 VITALS — BP 128/82 | HR 90 | Temp 96.2°F | Resp 18 | Wt 169.7 lb

## 2022-10-15 DIAGNOSIS — D649 Anemia, unspecified: Secondary | ICD-10-CM | POA: Insufficient documentation

## 2022-10-15 DIAGNOSIS — C770 Secondary and unspecified malignant neoplasm of lymph nodes of head, face and neck: Secondary | ICD-10-CM | POA: Diagnosis not present

## 2022-10-15 DIAGNOSIS — Z8546 Personal history of malignant neoplasm of prostate: Secondary | ICD-10-CM | POA: Insufficient documentation

## 2022-10-15 DIAGNOSIS — F17210 Nicotine dependence, cigarettes, uncomplicated: Secondary | ICD-10-CM | POA: Diagnosis not present

## 2022-10-15 DIAGNOSIS — C099 Malignant neoplasm of tonsil, unspecified: Secondary | ICD-10-CM | POA: Insufficient documentation

## 2022-10-15 DIAGNOSIS — F17200 Nicotine dependence, unspecified, uncomplicated: Secondary | ICD-10-CM

## 2022-10-15 DIAGNOSIS — Z7689 Persons encountering health services in other specified circumstances: Secondary | ICD-10-CM | POA: Diagnosis not present

## 2022-10-15 DIAGNOSIS — I10 Essential (primary) hypertension: Secondary | ICD-10-CM | POA: Insufficient documentation

## 2022-10-15 DIAGNOSIS — Z8042 Family history of malignant neoplasm of prostate: Secondary | ICD-10-CM | POA: Insufficient documentation

## 2022-10-15 DIAGNOSIS — Z95828 Presence of other vascular implants and grafts: Secondary | ICD-10-CM

## 2022-10-15 LAB — CBC WITH DIFFERENTIAL/PLATELET
Abs Immature Granulocytes: 0.05 10*3/uL (ref 0.00–0.07)
Basophils Absolute: 0 10*3/uL (ref 0.0–0.1)
Basophils Relative: 1 %
Eosinophils Absolute: 0.1 10*3/uL (ref 0.0–0.5)
Eosinophils Relative: 2 %
HCT: 39.7 % (ref 39.0–52.0)
Hemoglobin: 12.8 g/dL — ABNORMAL LOW (ref 13.0–17.0)
Immature Granulocytes: 1 %
Lymphocytes Relative: 15 %
Lymphs Abs: 0.9 10*3/uL (ref 0.7–4.0)
MCH: 25.7 pg — ABNORMAL LOW (ref 26.0–34.0)
MCHC: 32.2 g/dL (ref 30.0–36.0)
MCV: 79.6 fL — ABNORMAL LOW (ref 80.0–100.0)
Monocytes Absolute: 0.6 10*3/uL (ref 0.1–1.0)
Monocytes Relative: 10 %
Neutro Abs: 4.5 10*3/uL (ref 1.7–7.7)
Neutrophils Relative %: 71 %
Platelets: 309 10*3/uL (ref 150–400)
RBC: 4.99 MIL/uL (ref 4.22–5.81)
RDW: 15.6 % — ABNORMAL HIGH (ref 11.5–15.5)
WBC: 6.2 10*3/uL (ref 4.0–10.5)
nRBC: 0 % (ref 0.0–0.2)

## 2022-10-15 LAB — COMPREHENSIVE METABOLIC PANEL
ALT: 31 U/L (ref 0–44)
AST: 18 U/L (ref 15–41)
Albumin: 4.4 g/dL (ref 3.5–5.0)
Alkaline Phosphatase: 70 U/L (ref 38–126)
Anion gap: 8 (ref 5–15)
BUN: 14 mg/dL (ref 6–20)
CO2: 24 mmol/L (ref 22–32)
Calcium: 9 mg/dL (ref 8.9–10.3)
Chloride: 100 mmol/L (ref 98–111)
Creatinine, Ser: 0.78 mg/dL (ref 0.61–1.24)
GFR, Estimated: >60 mL/min (ref >60)
Glucose, Bld: 117 mg/dL — ABNORMAL HIGH (ref 70–99)
Potassium: 3.8 mmol/L (ref 3.5–5.1)
Sodium: 132 mmol/L — ABNORMAL LOW (ref 135–145)
Total Bilirubin: 0.3 mg/dL (ref 0.3–1.2)
Total Protein: 7.7 g/dL (ref 6.5–8.1)

## 2022-10-15 MED ORDER — NICOTINE POLACRILEX 2 MG MT GUM: 2.0000 mg | CHEWING_GUM | OROMUCOSAL | 3 refills | Status: DC | PRN

## 2022-10-15 MED ORDER — HEPARIN SOD (PORK) LOCK FLUSH 100 UNIT/ML IV SOLN
500.0000 [IU] | Freq: Once | INTRAVENOUS | Status: AC
  Administered 2022-10-15: 500 [IU] via INTRAVENOUS
  Filled 2022-10-15: qty 5

## 2022-10-15 MED ORDER — SODIUM CHLORIDE 0.9% FLUSH
10.0000 mL | Freq: Once | INTRAVENOUS | Status: AC
  Administered 2022-10-15: 10 mL via INTRAVENOUS
  Filled 2022-10-15: qty 10

## 2022-10-15 NOTE — Progress Notes (Signed)
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 S/p concurrent chemotherapy with radiation. Labs are reviewed and discussed with patient. PET scan assessment of treatment response results were reviewed and discussed with patient. Partial response.  Status post ENT evaluation PET scan showed new small left cervical lymphadenopathy, residual tonsil nodular and residual right level 2 adenopathy. CT scan showed persistently enlarged left cervical lymphadenopathy.  Right jugular chain lymphadenopathy has regressed. I recommend patient to get ENT evaluation for further biopsy and evaluation of resectability.  Dr. Pryor Ochoa has referred the patient to [facility] and patient has not established care yet.  I have communicated with Dr. Pryor Ochoa via secure chat he will reach out to [facility] to help patient establish care there. If his condition is deemed not resectable, we will further discuss with him about chemotherapy/radiation.   Normocytic anemia Hemoglobin is stable. monitor  Tobacco use disorder Advised patient to stop smoking.  He is motivated and asked prescription of nicotine gums.  He did not tolerate with nicotine patch well due to itchiness. Nicotine gums prescription was sent to pharmacy.   No orders of the defined types were placed in this encounter.    Follow up TBD   All questions were answered. The patient knows to call the clinic with any problems, questions or concerns.  No barriers to learning was detected.  [name], MD 10/15/2022     HISTORY OF PRESENTING ILLNESS:  Clarence Skinner 62 y.o. male presents to establish care for  I have reviewed his chart and materials related to his cancer extensively and collaborated history with the patient. Summary of oncologic history is as follows: Oncology History  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[place])  02/26/2022 Imaging   CTA neck showed 1, 4.3 x 2.5 cm ovoid soft tissue focus at the right level II/III stations (along the posterior aspect of the proximal ICA, carotid bifurcation and distal CCA). This likely reflects an enlarged lymph node.  2 Abnormal soft tissue surrounding the mid-to-distal cervical right ICA with resultant mild vessel narrowing 3 Atherosclerotic plaque within the bilateral common carotid and cervical internal carotid arteries without hemodynamically significant stenosis. 4 Vertebral arteries patent within the neck. Mild-to-moderate atherosclerotic narrowing at the origin of the right vertebral artery. No more than mild atherosclerotic stenosis at the origin of the left vertebral artery. 5 Aortic Atherosclerosis and Emphysema 6. Cervical spondylosis   02/27/2022 Imaging   MRI neck soft tissue w wo Constellation of findings most compatible with a roughly 2.4 cm Right Tonsillar Carcinoma (series 12, image 11) with associated right level 2 and level 3 malignant nodal, ex-nodal disease (series 14, image 13). Recommend ENT consultation    02/27/2022 Imaging   02/27/2022 MRI brain without contrast showed  No acute intracranial abnormality. Abnormal right neck soft tissues. See dedicated Neck MRI today reported separately. 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 These are slow flow vascular malformations which are generally asymptomatic. [name] Other moderately advanced but nonspecific cerebral white matter signal  changes.    02/28/2022 Initial Diagnosis   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[place])  02/28/2022, status post ultrasound core biopsy. Metastatic squamous cell carcinoma. Moderately differentiated with focal keratinization.  p16 negative.    02/28/2022 Cancer Staging   Staging form: Pharynx - P16 Negative Oropharynx, AJCC 8th Edition - Clinical stage from 02/28/2022: Stage IVA (cT2, cN2b, cM0, p16-) - Signed by [name], MD on 03/22/2022 Stage prefix: Initial diagnosis   03/19/2022 Imaging   PET scan showed Signs of RIGHT neck malignancy with metastatic involvement just below the skull base and at level II/III also on the RIGHT. No signs of contralateral nodal involvement.   03/28/2022 -  Chemotherapy   Patient is on Treatment Plan : HEAD/NECK Carboplatin (AUC 2) q7d     08/22/2022 Imaging   PET scan showed 1. Response therapy as evidenced by decrease in size and hypermetabolism of a primary right tonsillar nodule and right level 2 adenopathy. 2. New 8 mm left level 2 hypermetabolic lymph node. Recommend attention on follow-up. 3. Aortic atherosclerosis (ICD10-I70.0). Coronary artery calcification. 4.  Emphysema   10/04/2022 Imaging   CT soft tissue neck with contrast showed Enlarged left lateral retropharyngeal lymph node, site of hypermetabolism on recent PET, a site of likely residual disease. NIRADS 3 by PET.    Patient initially presented emergency room due to intermittent syncope/near syncope episodes, diaphoretic, nauseated. Imaging findings are concerning for right tonsil mass with level 2/level 3 lymph node involvement which contributes to syncope/near syncope episodes -carotid sinus syndrome. 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  COVID-19 infection.  Chemotherapy radiation was held for 2 weeks.    INTERVAL HISTORY Clarence Skinner is a 62 y.o. male who has above history reviewed by me today presents for follow up visit for management of stage IVa right tonsil squamous  cell carcinoma No fever, chills, nausea vomiting.  He has had post treatment evaluation by Dr. Pryor Ochoa.    MEDICAL HISTORY:  Past Medical History:  Diagnosis Date   COPD (chronic obstructive pulmonary disease) ([place])    ED (erectile dysfunction)    GERD (gastroesophageal reflux disease)    Gout    Hypertension    Multilevel degenerative disc disease    Pneumonia 11/29/2017   Prostate cancer ([place])    Sleep apnea    CPAP   Tachycardia    Wears dentures    partial upper and lower    SURGICAL HISTORY: Past Surgical History:  Procedure Laterality Date   ABCESS DRAINAGE  2009   tonsil   COLONOSCOPY WITH PROPOFOL N/A 02/11/2018   Procedure: COLONOSCOPY WITH PROPOFOL;  Surgeon: Toledo, Benay Pike, MD;  Location: [facility];  Service: Endoscopy;  Laterality: N/A;   ESOPHAGOGASTRODUODENOSCOPY (EGD) WITH PROPOFOL N/A 02/11/2018   Procedure: ESOPHAGOGASTRODUODENOSCOPY (EGD) WITH PROPOFOL;  Surgeon: Toledo, Benay Pike, MD;  Location: [facility];  Service: Endoscopy;  Laterality: N/A;   IR IMAGING GUIDED PORT INSERTION  04/09/2022   MASS EXCISION Right 08/14/2017   Procedure: EXCISION RIGHT TEMPORAL MASS SUBMENTAL MASS;  Surgeon: Carloyn Manner, MD;  Location: [place];  Service: ENT;  Laterality: Right;  sleep apnea   RADIOACTIVE SEED IMPLANT N/A 01/26/2019   Procedure: RADIOACTIVE SEED IMPLANT/BRACHYTHERAPY IMPLANT;  Surgeon: Billey Co, MD;  Location: [facility];  Service: Urology;  Laterality: N/A;   SEPTOPLASTY  2016   [place], [place]   VOLUME STUDY N/A 12/29/2018   Procedure: VOLUME STUDY;  Surgeon: Billey Co, MD;  Location: [facility];  Service: Urology;  Laterality: N/A;    SOCIAL HISTORY: Social History   Socioeconomic History   Marital status: Significant Other    Spouse name: Not on file   Number of children: Not on file   Years of education: Not on file   Highest education level: Not on file  Occupational History   Not on file  Tobacco Use    Smoking status: Every Day    Packs/day: 1.00    Years: 40.00    Additional pack years: 0.00    Total pack years: 40.00    Types: Cigarettes   Smokeless tobacco: Former    Types: Snuff, Chew    Quit date: 12/05/1984   Tobacco comments:    since age 31  Vaping Use   Vaping Use: Never used  Substance and Sexual Activity   Alcohol use: Not Currently    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   Drug use: Not Currently   Sexual activity: Yes  Other Topics Concern   Not on file  Social History Narrative   Not on file   Social Determinants of Health   Financial Resource Strain: High Risk (04/03/2022)   Overall Financial Resource Strain (CARDIA)    Difficulty of Paying Living Expenses: Hard  Food Insecurity: Food Insecurity Present (04/03/2022)   Hunger Vital Sign    Worried About Running Out of Food in the Last Year: Often true    Ran Out of Food in the Last Year: Often true  Transportation Needs: Unmet Transportation Needs (04/03/2022)   PRAPARE - Hydrologist (Medical): Yes    Lack of Transportation (Non-Medical): Yes  Physical Activity: Inactive (04/03/2022)   Exercise Vital Sign    Days of Exercise per Week: 0 days    Minutes of Exercise per Session: 0 min  Stress: Stress Concern Present (04/03/2022)   [place]    Feeling of Stress : Very much  Social Connections: Moderately Isolated (04/03/2022)   Social Connection and Isolation Panel [NHANES]    Frequency of Communication with Friends and Family: Never    Frequency of Social Gatherings with Friends and Family: Three times a week    Attends Religious Services: Never    Active Member of Clubs or Organizations: No    Attends Archivist Meetings: Not on file    Marital Status: Living with partner  Intimate Partner Violence: Not At Risk (04/03/2022)   Humiliation, Afraid, Rape, and Kick questionnaire    Fear of  Current or Ex-Partner: No    Emotionally Abused: No    Physically Abused: No    Sexually Abused: No    FAMILY HISTORY: Family History  Problem Relation Age of Onset   Anxiety disorder Mother    Cancer Father    Hypertension Sister    Diabetes Sister    Asthma Sister    Other Brother        mva  at age 54   Gout Brother    Hypertension Brother    Prostate cancer Maternal Uncle    Prostate cancer Maternal Uncle     ALLERGIES:  is allergic to chocolate, egg-derived products, milk (cow), benadryl [diphenhydramine], and milk-related compounds.  MEDICATIONS:  Current Outpatient Medications  Medication Sig Dispense Refill   albuterol (PROVENTIL HFA;VENTOLIN HFA) 108 (90 Base) MCG/ACT inhaler Inhale into the lungs every 6 (six) hours as needed for wheezing or shortness of breath.  allopurinol (ZYLOPRIM) 300 MG tablet Take 300 mg by mouth daily.     amLODipine-benazepril (LOTREL) 5-20 MG capsule Take 1 capsule by mouth daily. (Patient taking differently: Take 1 capsule by mouth as needed.) 90 capsule 0   Fluticasone-Umeclidin-Vilant (TRELEGY ELLIPTA) 100-62.5-25 MCG/ACT AEPB Trelegy Ellipta 100-62.5-25 MCG/INH Inhalation Aerosol Powder Breath Activated QTY: 90 each Days: 90 Refills: 1  Written: 11/02/20 Patient Instructions: One puff once a day     furosemide (LASIX) 20 MG tablet Take 20 mg by mouth as needed.     lidocaine-prilocaine (EMLA) cream Apply 1 Application topically as directed. Apply small amount of cream to port a cath site 1-2 hours prior to port being accessed. 30 g 2   loratadine (CLARITIN) 10 MG tablet Take 10 mg by mouth daily.     magic mouthwash w/lidocaine SOLN Take 5 mLs by mouth 4 (four) times daily as needed for mouth pain. Sig: Swish/Spit 5-10 ml four times a day as needed. Dispense 480 ml. 1RF 480 mL 1   nicotine polacrilex (NICORETTE) 2 MG gum Take 1 each (2 mg total) by mouth every 3 (three) hours as needed for smoking cessation. 100 each 3   pantoprazole  (PROTONIX) 40 MG tablet Take 40 mg by mouth daily.     rosuvastatin (CRESTOR) 20 MG tablet Take 20 mg by mouth daily.     tadalafil (CIALIS) 20 MG tablet Take 1 tablet (20 mg total) by mouth daily. 30 tablet 11   Vitamin D, Ergocalciferol, (DRISDOL) 1.25 MG (50000 UNIT) CAPS capsule Take 50,000 Units by mouth once a week.     No current facility-administered medications for this visit.   Facility-Administered Medications Ordered in Other Visits  Medication Dose Route Frequency Provider Last Rate Last Admin   sodium chloride flush (NS) 0.9 % injection 10 mL  10 mL Intracatheter PRN [name], MD   10 mL at 04/11/22 1221    Review of Systems  Constitutional:  Negative for chills, fatigue, fever and unexpected weight change.  HENT:   Positive for hearing loss. Negative for voice change.        Mouth soreness.   Eyes:  Negative for eye problems and icterus.  Respiratory:  Negative for chest tightness, cough and shortness of breath.   Cardiovascular:  Negative for chest pain and leg swelling.  Gastrointestinal:  Negative for abdominal distention and abdominal pain.  Endocrine: Negative for hot flashes.  Genitourinary:  Negative for difficulty urinating, dysuria and frequency.   Musculoskeletal:  Positive for arthralgias.  Skin:  Negative for itching and rash.  Neurological:  Negative for light-headedness and numbness.  Hematological:  Negative for adenopathy. Does not bruise/bleed easily.  Psychiatric/Behavioral:  Negative for confusion.      PHYSICAL EXAMINATION: ECOG PERFORMANCE STATUS: 1 - Symptomatic but completely ambulatory  Vitals:   10/15/22 1010  BP: 128/82  Pulse: 90  Resp: 18  Temp: (!) 96.2 F (35.7 C)  SpO2: 100%   Filed Weights   10/15/22 1010  Weight: 169 lb 11.2 oz (77 kg)    Physical Exam Constitutional:      General: He is not in acute distress.    Appearance: He is not diaphoretic.  HENT:     Head: Normocephalic and atraumatic.     Nose: Nose normal.      Mouth/Throat:     Pharynx: No oropharyngeal exudate.  Eyes:     General: No scleral icterus.    Pupils: Pupils are equal, round, and reactive to light.  Cardiovascular:     Rate and Rhythm: Normal rate and regular rhythm.     Heart sounds: No murmur heard. Pulmonary:     Effort: Pulmonary effort is normal. No respiratory distress.     Breath sounds: No rales.  Chest:     Chest wall: No tenderness.  Abdominal:     General: There is no distension.     Palpations: Abdomen is soft.     Tenderness: There is no abdominal tenderness.  Musculoskeletal:        General: Normal range of motion.     Cervical back: Normal range of motion and neck supple.  Skin:    General: Skin is warm and dry.     Findings: No erythema.  Neurological:     Mental Status: He is alert and oriented to person, place, and time.     Cranial Nerves: No cranial nerve deficit.     Motor: No abnormal muscle tone.     Coordination: Coordination normal.  Psychiatric:        Mood and Affect: Mood and affect normal.      LABORATORY DATA:  I have reviewed the data as listed    Latest Ref Rng & Units 10/15/2022    9:59 AM 08/28/2022    1:29 PM 07/05/2022   10:09 AM  CBC  WBC 4.0 - 10.5 K/uL 6.2  7.7  4.9   Hemoglobin 13.0 - 17.0 g/dL 12.8  12.0  11.2   Hematocrit 39.0 - 52.0 % 39.7  37.9  34.2   Platelets 150 - 400 K/uL 309  275  413       Latest Ref Rng & Units 10/15/2022    9:59 AM 08/28/2022    1:29 PM 07/05/2022   10:09 AM  CMP  Glucose 70 - 99 mg/dL 117  138  112   BUN 6 - 20 mg/dL 14  19  12    Creatinine 0.61 - 1.24 mg/dL 0.78  1.56  0.72   Sodium 135 - 145 mmol/L 132  138  137   Potassium 3.5 - 5.1 mmol/L 3.8  3.4  3.6   Chloride 98 - 111 mmol/L 100  105  104   CO2 22 - 32 mmol/L 24  23  24    Calcium 8.9 - 10.3 mg/dL 9.0  8.5  8.8   Total Protein 6.5 - 8.1 g/dL 7.7  6.9  7.1   Total Bilirubin 0.3 - 1.2 mg/dL 0.3  0.2  0.4   Alkaline Phos 38 - 126 U/L 70  70  74   AST 15 - 41 U/L 18  12  14     ALT 0 - 44 U/L 31  10  11        RADIOGRAPHIC STUDIES: I have personally reviewed the radiological images as listed and agreed with the findings in the report. CT Soft Tissue Neck W Contrast  Result Date: 10/04/2022 CLINICAL DATA:  Head neck cancer, assess treatment response EXAM: CT NECK WITH CONTRAST TECHNIQUE: Multidetector CT imaging of the neck was performed using the standard protocol following the bolus administration of intravenous contrast. RADIATION DOSE REDUCTION: This exam was performed according to the departmental dose-optimization program which includes automated exposure control, adjustment of the mA and/or kV according to patient size and/or use of iterative reconstruction technique. CONTRAST:  43mL OMNIPAQUE IOHEXOL 300 MG/ML  SOLN COMPARISON:  08/22/2022 PET-CT and MRI of the neck 02/27/2022 FINDINGS: Pharynx and larynx: Submucosal low-density in the supraglottic larynx asymmetrically affecting the  right. Chronic deep extension of the right-sided hyoid bone compared to the left, no bony erosion. No discrete mass is seen. Salivary glands: No evidence of mass or inflammation. Post treatment changes bilaterally. Thyroid: Normal Lymph nodes: 13 mm rounded and mildly heterogeneous left lateral retropharyngeal lymph node, hypermetabolic on prior PET. Right jugular chain lymphadenopathy has regressed with non masslike architectural distortion and soft tissue density. Vascular: Atheromatous calcification. Limited intracranial: Negative Visualized orbits: Negative Mastoids and visualized paranasal sinuses: Clear Skeleton: No acute or erosive finding. Upper chest: Clear apical lungs where there is emphysema. IMPRESSION: Enlarged left lateral retropharyngeal lymph node, site of hypermetabolism on recent PET, a site of likely residual disease. NIRADS 3 by PET. Electronically Signed   By: Jorje Guild M.D.   On: 10/04/2022 04:40

## 2022-10-15 NOTE — Assessment & Plan Note (Addendum)
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 S/p concurrent chemotherapy with radiation. Labs are reviewed and discussed with patient. PET scan assessment of treatment response results were reviewed and discussed with patient. Partial response.  Status post ENT evaluation PET scan showed new small left cervical lymphadenopathy, residual tonsil nodular and residual right level 2 adenopathy. CT scan showed persistently enlarged left cervical lymphadenopathy.  Right jugular chain lymphadenopathy has regressed. I recommend patient to get ENT evaluation for further biopsy and evaluation of resectability.  Dr. Pryor Ochoa has referred the patient to [facility] and patient has not established care yet.  I have communicated with Dr. Pryor Ochoa via secure chat he will reach out to [facility] to help patient establish care there. If his condition is deemed not resectable, we will further discuss with him about chemotherapy/radiation.

## 2022-10-15 NOTE — Assessment & Plan Note (Signed)
Hemoglobin is stable. monitor 

## 2022-10-15 NOTE — Assessment & Plan Note (Signed)
Advised patient to stop smoking.  He is motivated and asked prescription of nicotine gums.  He did not tolerate with nicotine patch well due to itchiness. Nicotine gums prescription was sent to pharmacy.

## 2022-10-19 DIAGNOSIS — R918 Other nonspecific abnormal finding of lung field: Secondary | ICD-10-CM | POA: Diagnosis not present

## 2022-10-19 DIAGNOSIS — R599 Enlarged lymph nodes, unspecified: Secondary | ICD-10-CM | POA: Diagnosis not present

## 2022-10-19 DIAGNOSIS — K118 Other diseases of salivary glands: Secondary | ICD-10-CM | POA: Diagnosis not present

## 2022-10-22 DIAGNOSIS — Z419 Encounter for procedure for purposes other than remedying health state, unspecified: Secondary | ICD-10-CM | POA: Diagnosis not present

## 2022-10-23 DIAGNOSIS — C099 Malignant neoplasm of tonsil, unspecified: Secondary | ICD-10-CM | POA: Diagnosis not present

## 2022-10-30 ENCOUNTER — Other Ambulatory Visit: Payer: Self-pay | Admitting: Internal Medicine

## 2022-10-31 ENCOUNTER — Other Ambulatory Visit: Payer: Self-pay | Admitting: Oncology

## 2022-10-31 ENCOUNTER — Other Ambulatory Visit: Payer: Self-pay

## 2022-10-31 DIAGNOSIS — C099 Malignant neoplasm of tonsil, unspecified: Secondary | ICD-10-CM | POA: Diagnosis not present

## 2022-10-31 DIAGNOSIS — Z7951 Long term (current) use of inhaled steroids: Secondary | ICD-10-CM | POA: Diagnosis not present

## 2022-10-31 DIAGNOSIS — I10 Essential (primary) hypertension: Secondary | ICD-10-CM | POA: Diagnosis not present

## 2022-10-31 DIAGNOSIS — F17210 Nicotine dependence, cigarettes, uncomplicated: Secondary | ICD-10-CM | POA: Diagnosis not present

## 2022-10-31 DIAGNOSIS — J449 Chronic obstructive pulmonary disease, unspecified: Secondary | ICD-10-CM | POA: Diagnosis not present

## 2022-10-31 DIAGNOSIS — C4492 Squamous cell carcinoma of skin, unspecified: Secondary | ICD-10-CM | POA: Diagnosis not present

## 2022-10-31 DIAGNOSIS — Z7689 Persons encountering health services in other specified circumstances: Secondary | ICD-10-CM | POA: Diagnosis not present

## 2022-10-31 MED ORDER — PANTOPRAZOLE SODIUM 40 MG PO TBEC: 40.0000 mg | DELAYED_RELEASE_TABLET | Freq: Every day | ORAL | 0 refills | Status: DC

## 2022-10-31 MED ORDER — ALLOPURINOL 300 MG PO TABS: 300.0000 mg | ORAL_TABLET | Freq: Every day | ORAL | 0 refills | Status: DC

## 2022-10-31 MED ORDER — LORATADINE 10 MG PO TABS: 10.0000 mg | ORAL_TABLET | Freq: Every day | ORAL | 0 refills | Status: DC

## 2022-10-31 NOTE — Telephone Encounter (Signed)
Copied from CRM [id number]. Topic: General - Other >> Oct 30, 2022  4:41 PM Clarence Skinner wrote: Reason for CRM: The patient would like to be contacted by a member of clinical staff regarding potentially receiving the RSV vaccine   Please contact the patient further when possible

## 2022-11-01 ENCOUNTER — Other Ambulatory Visit: Payer: Self-pay

## 2022-11-02 NOTE — Telephone Encounter (Unsigned)
Copied from CRM [id number]. Topic: General - Other >> Nov 02, 2022 10:03 AM Turkey B wrote: Reason for CRM: Pt called in states [employer] faxed in paperwork for In for nebulizer and inhaler chamber 2 days ago and hasn't heard anything. Please call back for further assistance.

## 2022-11-05 ENCOUNTER — Other Ambulatory Visit: Payer: Self-pay | Admitting: Internal Medicine

## 2022-11-05 NOTE — Telephone Encounter (Signed)
Medication Refill - Medication: rosuvastatin (CRESTOR) 20 MG tablet   Has the patient contacted their pharmacy? Yes.     Preferred Pharmacy (with phone number or street name):  [id number] - [place], [place] - [address] Phone: [phone]  Fax: [phone]     Has the patient been seen for an appointment in the last year OR does the patient have an upcoming appointment? Yes.    Please assist patient further

## 2022-11-06 DIAGNOSIS — J449 Chronic obstructive pulmonary disease, unspecified: Secondary | ICD-10-CM | POA: Diagnosis not present

## 2022-11-06 DIAGNOSIS — C099 Malignant neoplasm of tonsil, unspecified: Secondary | ICD-10-CM | POA: Diagnosis not present

## 2022-11-06 DIAGNOSIS — F17210 Nicotine dependence, cigarettes, uncomplicated: Secondary | ICD-10-CM | POA: Diagnosis not present

## 2022-11-06 DIAGNOSIS — C4492 Squamous cell carcinoma of skin, unspecified: Secondary | ICD-10-CM | POA: Diagnosis not present

## 2022-11-06 DIAGNOSIS — Z7689 Persons encountering health services in other specified circumstances: Secondary | ICD-10-CM | POA: Diagnosis not present

## 2022-11-06 DIAGNOSIS — I10 Essential (primary) hypertension: Secondary | ICD-10-CM | POA: Diagnosis not present

## 2022-11-06 NOTE — Telephone Encounter (Signed)
Requested medication (s) are due for refill today: for review  Requested medication (s) are on the active medication list: yes    Last refill: 08/28/22  Amount not specified  Future visit scheduled yes 12/27/22  Notes to clinic:Historical provider, please review. Thank you.  Requested Prescriptions  Pending Prescriptions Disp Refills   rosuvastatin (CRESTOR) 20 MG tablet      Sig: Take 1 tablet (20 mg total) by mouth daily.     Cardiovascular:  Antilipid - Statins 2 Failed - 11/05/2022  2:54 PM      Failed - Lipid Panel in normal range within the last 12 months    Cholesterol  Date Value Ref Range Status  08/01/2022 152 <200 mg/dL Final   LDL Cholesterol (Calc)  Date Value Ref Range Status  08/01/2022 82 mg/dL (calc) Final    Comment:    Reference range: <100 . Desirable range <100 mg/dL for primary prevention;   <70 mg/dL for patients with CHD or diabetic patients  with > or = 2 CHD risk factors. [name] LDL-C is now calculated using the Martin-Hopkins  calculation, which is a validated novel method providing  better accuracy than the Friedewald equation in the  estimation of LDL-C.  Horald Pollen et al. Lenox Ahr. 1610;960(45): 2061-2068  (http://education.[facility].com/faq/FAQ164)    HDL  Date Value Ref Range Status  08/01/2022 54 > OR = 40 mg/dL Final   Triglycerides  Date Value Ref Range Status  08/01/2022 81 <150 mg/dL Final         Passed - Cr in normal range and within 360 days    Creatinine  Date Value Ref Range Status  10/03/2012 1.04 0.60 - 1.30 mg/dL Final   Creatinine, Ser  Date Value Ref Range Status  10/15/2022 0.78 0.61 - 1.24 mg/dL Final         Passed - Patient is not pregnant      Passed - Valid encounter within last 12 months    Recent Outpatient Visits           1 month ago Essential hypertension   [facility] [name], DO   2 months ago Essential hypertension   [facility]  [name], DO   3 months ago Chronic obstructive pulmonary disease, unspecified COPD type [facility])   [facility] [name], DO       Future Appointments             In 1 month [name], DO [facility]

## 2022-11-07 MED ORDER — ROSUVASTATIN CALCIUM 20 MG PO TABS: 20.0000 mg | ORAL_TABLET | Freq: Every day | ORAL | 0 refills | Status: DC

## 2022-11-08 ENCOUNTER — Ambulatory Visit: Payer: Medicaid Other

## 2022-11-08 DIAGNOSIS — G4733 Obstructive sleep apnea (adult) (pediatric): Secondary | ICD-10-CM

## 2022-11-08 DIAGNOSIS — Z7689 Persons encountering health services in other specified circumstances: Secondary | ICD-10-CM | POA: Diagnosis not present

## 2022-11-09 ENCOUNTER — Telehealth: Payer: Self-pay | Admitting: Internal Medicine

## 2022-11-09 DIAGNOSIS — G4733 Obstructive sleep apnea (adult) (pediatric): Secondary | ICD-10-CM | POA: Diagnosis not present

## 2022-11-09 DIAGNOSIS — Z7689 Persons encountering health services in other specified circumstances: Secondary | ICD-10-CM | POA: Diagnosis not present

## 2022-11-09 NOTE — Telephone Encounter (Signed)
Erroneous encounter

## 2022-11-10 DIAGNOSIS — Z7689 Persons encountering health services in other specified circumstances: Secondary | ICD-10-CM | POA: Diagnosis not present

## 2022-11-10 DIAGNOSIS — C4492 Squamous cell carcinoma of skin, unspecified: Secondary | ICD-10-CM | POA: Diagnosis not present

## 2022-11-10 DIAGNOSIS — R590 Localized enlarged lymph nodes: Secondary | ICD-10-CM | POA: Diagnosis not present

## 2022-11-10 DIAGNOSIS — R930 Abnormal findings on diagnostic imaging of skull and head, not elsewhere classified: Secondary | ICD-10-CM | POA: Diagnosis not present

## 2022-11-10 DIAGNOSIS — R599 Enlarged lymph nodes, unspecified: Secondary | ICD-10-CM | POA: Diagnosis not present

## 2022-11-15 ENCOUNTER — Other Ambulatory Visit: Payer: Self-pay | Admitting: Nurse Practitioner

## 2022-11-15 NOTE — Telephone Encounter (Signed)
Requested Prescriptions  Pending Prescriptions Disp Refills   rosuvastatin (CRESTOR) 20 MG tablet [Pharmacy Med Name: ROSUVASTATIN CALCIUM 20 MG TAB] 90 tablet 1    Sig: TAKE 1 TABLET BY MOUTH EVERY DAY     Cardiovascular:  Antilipid - Statins 2 Failed - 11/15/2022 11:36 AM      Failed - Lipid Panel in normal range within the last 12 months    Cholesterol  Date Value Ref Range Status  08/01/2022 152 <200 mg/dL Final   LDL Cholesterol (Calc)  Date Value Ref Range Status  08/01/2022 82 mg/dL (calc) Final    Comment:    Reference range: <100 . Desirable range <100 mg/dL for primary prevention;   <70 mg/dL for patients with CHD or diabetic patients  with > or = 2 CHD risk factors. [name] LDL-C is now calculated using the Martin-Hopkins  calculation, which is a validated novel method providing  better accuracy than the Friedewald equation in the  estimation of LDL-C.  Horald Pollen et al. Lenox Ahr. 1610;960(45): 2061-2068  (http://education.[facility].com/faq/FAQ164)    HDL  Date Value Ref Range Status  08/01/2022 54 > OR = 40 mg/dL Final   Triglycerides  Date Value Ref Range Status  08/01/2022 81 <150 mg/dL Final         Passed - Cr in normal range and within 360 days    Creatinine  Date Value Ref Range Status  10/03/2012 1.04 0.60 - 1.30 mg/dL Final   Creatinine, Ser  Date Value Ref Range Status  10/15/2022 0.78 0.61 - 1.24 mg/dL Final         Passed - Patient is not pregnant      Passed - Valid encounter within last 12 months    Recent Outpatient Visits           1 month ago Essential hypertension   [place] [facility] [name], DO   2 months ago Essential hypertension   [facility] [name], DO   3 months ago Chronic obstructive pulmonary disease, unspecified COPD type [facility])   [facility] [name], DO       Future Appointments             In 1 month  [name], DO [facility], [facility]

## 2022-11-19 DIAGNOSIS — G4733 Obstructive sleep apnea (adult) (pediatric): Secondary | ICD-10-CM | POA: Diagnosis not present

## 2022-11-19 DIAGNOSIS — J449 Chronic obstructive pulmonary disease, unspecified: Secondary | ICD-10-CM | POA: Diagnosis not present

## 2022-11-20 DIAGNOSIS — Z8546 Personal history of malignant neoplasm of prostate: Secondary | ICD-10-CM | POA: Diagnosis not present

## 2022-11-20 DIAGNOSIS — Z923 Personal history of irradiation: Secondary | ICD-10-CM | POA: Diagnosis not present

## 2022-11-20 DIAGNOSIS — C4492 Squamous cell carcinoma of skin, unspecified: Secondary | ICD-10-CM | POA: Diagnosis not present

## 2022-11-20 DIAGNOSIS — G4730 Sleep apnea, unspecified: Secondary | ICD-10-CM | POA: Diagnosis not present

## 2022-11-20 DIAGNOSIS — Z7689 Persons encountering health services in other specified circumstances: Secondary | ICD-10-CM | POA: Diagnosis not present

## 2022-11-20 DIAGNOSIS — F17210 Nicotine dependence, cigarettes, uncomplicated: Secondary | ICD-10-CM | POA: Diagnosis not present

## 2022-11-20 DIAGNOSIS — Z7189 Other specified counseling: Secondary | ICD-10-CM | POA: Diagnosis not present

## 2022-11-20 DIAGNOSIS — I10 Essential (primary) hypertension: Secondary | ICD-10-CM | POA: Diagnosis not present

## 2022-11-20 DIAGNOSIS — Z792 Long term (current) use of antibiotics: Secondary | ICD-10-CM | POA: Diagnosis not present

## 2022-11-20 DIAGNOSIS — C099 Malignant neoplasm of tonsil, unspecified: Secondary | ICD-10-CM | POA: Diagnosis not present

## 2022-11-20 DIAGNOSIS — H9192 Unspecified hearing loss, left ear: Secondary | ICD-10-CM | POA: Diagnosis not present

## 2022-11-20 DIAGNOSIS — G893 Neoplasm related pain (acute) (chronic): Secondary | ICD-10-CM | POA: Diagnosis not present

## 2022-11-20 DIAGNOSIS — C410 Malignant neoplasm of bones of skull and face: Secondary | ICD-10-CM | POA: Diagnosis not present

## 2022-11-20 DIAGNOSIS — J449 Chronic obstructive pulmonary disease, unspecified: Secondary | ICD-10-CM | POA: Diagnosis not present

## 2022-11-21 DIAGNOSIS — C099 Malignant neoplasm of tonsil, unspecified: Secondary | ICD-10-CM | POA: Diagnosis not present

## 2022-11-21 DIAGNOSIS — Z419 Encounter for procedure for purposes other than remedying health state, unspecified: Secondary | ICD-10-CM | POA: Diagnosis not present

## 2022-11-22 DIAGNOSIS — G4733 Obstructive sleep apnea (adult) (pediatric): Secondary | ICD-10-CM | POA: Diagnosis not present

## 2022-11-22 DIAGNOSIS — J449 Chronic obstructive pulmonary disease, unspecified: Secondary | ICD-10-CM | POA: Diagnosis not present

## 2022-11-28 DIAGNOSIS — Z7689 Persons encountering health services in other specified circumstances: Secondary | ICD-10-CM | POA: Diagnosis not present

## 2022-12-03 DIAGNOSIS — C099 Malignant neoplasm of tonsil, unspecified: Secondary | ICD-10-CM | POA: Diagnosis not present

## 2022-12-03 DIAGNOSIS — Z7689 Persons encountering health services in other specified circumstances: Secondary | ICD-10-CM | POA: Diagnosis not present

## 2022-12-03 DIAGNOSIS — J449 Chronic obstructive pulmonary disease, unspecified: Secondary | ICD-10-CM | POA: Diagnosis not present

## 2022-12-03 DIAGNOSIS — F17210 Nicotine dependence, cigarettes, uncomplicated: Secondary | ICD-10-CM | POA: Diagnosis not present

## 2022-12-03 DIAGNOSIS — I10 Essential (primary) hypertension: Secondary | ICD-10-CM | POA: Diagnosis not present

## 2022-12-03 DIAGNOSIS — C109 Malignant neoplasm of oropharynx, unspecified: Secondary | ICD-10-CM | POA: Diagnosis not present

## 2022-12-06 ENCOUNTER — Telehealth: Payer: Self-pay | Admitting: Primary Care

## 2022-12-06 DIAGNOSIS — G4733 Obstructive sleep apnea (adult) (pediatric): Secondary | ICD-10-CM

## 2022-12-06 NOTE — Telephone Encounter (Signed)
Called and spoke to patient. He is requesting sleep study results from 11/08/2022. Results are scanned in chart under media tab.  He would like a new cpap machine.  Beth, please advise. Thanks

## 2022-12-06 NOTE — Telephone Encounter (Signed)
Patient called to get an update on his sleep study machine.  He stated he had the test over 3 weeks ago and has not heard anything further.  Please advise and call patient to discuss further.  CB# [phone]

## 2022-12-07 NOTE — Telephone Encounter (Signed)
Spoke with the pt and notified of results of sleep study. Pt verbalized understanding and was agreeable to CPAP therapy. I have placed DME referral for this and pt aware to contact the office for 31-90 day f/u once they begin using machine per insurance requirement.   

## 2022-12-07 NOTE — Telephone Encounter (Signed)
HST on 11/08/22 showed mild-moderate OSA >> AHI 14.9/hour with SpO2 low 78%   If open to starting CPAP therapy please place order for patient to be started on auto CPAP 5-15cm h20 with mask of choice. He needs to wear CPAP while sleeping, aim for 4-6 hours of usage a night. If he has any questions and would like to discuss further please set up virtual visit first opening

## 2022-12-07 NOTE — Telephone Encounter (Signed)
PT returning call. Please try again.

## 2022-12-07 NOTE — Telephone Encounter (Signed)
Lm for patient.  

## 2022-12-10 ENCOUNTER — Inpatient Hospital Stay: Payer: Medicaid Other | Attending: Oncology | Admitting: Hospice and Palliative Medicine

## 2022-12-10 DIAGNOSIS — C099 Malignant neoplasm of tonsil, unspecified: Secondary | ICD-10-CM

## 2022-12-10 NOTE — Progress Notes (Signed)
Patient unavailable to talk. Will reschedule.

## 2022-12-12 ENCOUNTER — Ambulatory Visit: Payer: Medicaid Other | Admitting: Radiation Oncology

## 2022-12-12 DIAGNOSIS — Z7689 Persons encountering health services in other specified circumstances: Secondary | ICD-10-CM | POA: Diagnosis not present

## 2022-12-13 DIAGNOSIS — J449 Chronic obstructive pulmonary disease, unspecified: Secondary | ICD-10-CM | POA: Diagnosis not present

## 2022-12-13 DIAGNOSIS — F17210 Nicotine dependence, cigarettes, uncomplicated: Secondary | ICD-10-CM | POA: Diagnosis not present

## 2022-12-13 DIAGNOSIS — C109 Malignant neoplasm of oropharynx, unspecified: Secondary | ICD-10-CM | POA: Diagnosis not present

## 2022-12-13 DIAGNOSIS — I10 Essential (primary) hypertension: Secondary | ICD-10-CM | POA: Diagnosis not present

## 2022-12-13 DIAGNOSIS — C099 Malignant neoplasm of tonsil, unspecified: Secondary | ICD-10-CM | POA: Diagnosis not present

## 2022-12-18 ENCOUNTER — Encounter: Payer: Self-pay | Admitting: Oncology

## 2022-12-18 DIAGNOSIS — Z7689 Persons encountering health services in other specified circumstances: Secondary | ICD-10-CM | POA: Diagnosis not present

## 2022-12-18 DIAGNOSIS — Z510 Encounter for antineoplastic radiation therapy: Secondary | ICD-10-CM | POA: Diagnosis not present

## 2022-12-18 DIAGNOSIS — C099 Malignant neoplasm of tonsil, unspecified: Secondary | ICD-10-CM | POA: Diagnosis not present

## 2022-12-19 DIAGNOSIS — Z7689 Persons encountering health services in other specified circumstances: Secondary | ICD-10-CM | POA: Diagnosis not present

## 2022-12-19 DIAGNOSIS — Z510 Encounter for antineoplastic radiation therapy: Secondary | ICD-10-CM | POA: Diagnosis not present

## 2022-12-19 DIAGNOSIS — C099 Malignant neoplasm of tonsil, unspecified: Secondary | ICD-10-CM | POA: Diagnosis not present

## 2022-12-20 DIAGNOSIS — Z7689 Persons encountering health services in other specified circumstances: Secondary | ICD-10-CM | POA: Diagnosis not present

## 2022-12-20 DIAGNOSIS — C099 Malignant neoplasm of tonsil, unspecified: Secondary | ICD-10-CM | POA: Diagnosis not present

## 2022-12-20 DIAGNOSIS — Z510 Encounter for antineoplastic radiation therapy: Secondary | ICD-10-CM | POA: Diagnosis not present

## 2022-12-21 DIAGNOSIS — C099 Malignant neoplasm of tonsil, unspecified: Secondary | ICD-10-CM | POA: Diagnosis not present

## 2022-12-21 DIAGNOSIS — Z510 Encounter for antineoplastic radiation therapy: Secondary | ICD-10-CM | POA: Diagnosis not present

## 2022-12-21 DIAGNOSIS — Z7689 Persons encountering health services in other specified circumstances: Secondary | ICD-10-CM | POA: Diagnosis not present

## 2022-12-22 DIAGNOSIS — Z419 Encounter for procedure for purposes other than remedying health state, unspecified: Secondary | ICD-10-CM | POA: Diagnosis not present

## 2022-12-24 DIAGNOSIS — Z510 Encounter for antineoplastic radiation therapy: Secondary | ICD-10-CM | POA: Diagnosis not present

## 2022-12-24 DIAGNOSIS — Z7689 Persons encountering health services in other specified circumstances: Secondary | ICD-10-CM | POA: Diagnosis not present

## 2022-12-24 DIAGNOSIS — Z5112 Encounter for antineoplastic immunotherapy: Secondary | ICD-10-CM | POA: Diagnosis not present

## 2022-12-24 DIAGNOSIS — C099 Malignant neoplasm of tonsil, unspecified: Secondary | ICD-10-CM | POA: Diagnosis not present

## 2022-12-25 ENCOUNTER — Other Ambulatory Visit: Payer: Self-pay

## 2022-12-25 DIAGNOSIS — Z7689 Persons encountering health services in other specified circumstances: Secondary | ICD-10-CM | POA: Diagnosis not present

## 2022-12-25 DIAGNOSIS — Z5112 Encounter for antineoplastic immunotherapy: Secondary | ICD-10-CM | POA: Diagnosis not present

## 2022-12-25 DIAGNOSIS — C099 Malignant neoplasm of tonsil, unspecified: Secondary | ICD-10-CM | POA: Diagnosis not present

## 2022-12-25 DIAGNOSIS — Z510 Encounter for antineoplastic radiation therapy: Secondary | ICD-10-CM | POA: Diagnosis not present

## 2022-12-25 MED ORDER — MAGIC MOUTHWASH: 5.0000 mL | Freq: Four times a day (QID) | ORAL | 0 refills | Status: DC | PRN

## 2022-12-25 NOTE — Progress Notes (Unsigned)
Established Patient Office Visit  Subjective    Patient ID: Clarence Skinner, Clarence Skinner    DOB: 1961-04-15  Age: 62 y.o. MRN: [id number]  CC:  No chief complaint on file.   HPI Clarence Skinner presents to follow up on chronic medical conditions.   Hypertension/OSA: -Medications: Amlodipine-Benazepril decreased to 5-20 mg at LOV. Patient rarely had to take and has only taken it a few times when BP >130. Also taking Lasix 20 mg PRN leg swelling (has to take 1-2 a year; hasn't had to take since LOV). -Patient is compliant with above medications and reports no side effects. -Checking BP at home (average): Yes; 120-130/80 without medication  -Denies any SOB, CP, vision changes, LE edema  -Unable to tolerate CPAP machine   HLD: -Medications: Crestor 20 mg -Patient is compliant with above medications and reports no side effects.  -Last lipid panel: Lipid Panel     Component Value Date/Time   CHOL 152 08/01/2022 0933   TRIG 81 08/01/2022 0933   HDL 54 08/01/2022 0933   CHOLHDL 2.8 08/01/2022 0933   LDLCALC 82 08/01/2022 0933    COPD: -COPD status: stable -Current medications: Trelegy, Albuterol  -Satisfied with current treatment?: yes -Oxygen use: no -Dyspnea frequency: yes -Cough frequency: yes -Rescue inhaler frequency: monthly sometimes more -Limitation of activity: yes -Pneumovax: UTD - got at [facility] [place]  -Influenza: Up to Date -Following with Pulmonology for OSA  -Is planning on having all of his teeth pulled soon - was treated with multiple antibiotics and ENT was trying to give him a steroid but insurance won't cover ?  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 Planning on CT scan in March. -Following with hospice/palliative care, note from 09/10/22 reviewed.   History of Prostate  cancer: -Following with Urology, note reviewed from 12/27/21 -First diagnosed in 07/2018 -Underwent brachytherapy with seen implant 01/2019 -No urinary complaints -Last PSA 5/23 stable at 0.3  GERD: -Currently on Protonix 40 mg, no symptoms  Gout: -Currently Allopurinol 300 mg -Usually flares in the big toe -No flares since being on Alloprinil   Pre-Diabetes: -A1c 1/24 5.7% -Not currently on medications  Health Maintenance: -Blood work UTD -Lung cancer screening due. Referral placed, patient will call them back -Colon cancer screening due, will discuss at follow up    Outpatient Encounter Medications as of 12/27/2022  Medication Sig   albuterol (PROVENTIL HFA;VENTOLIN HFA) 108 (90 Base) MCG/ACT inhaler Inhale into the lungs every 6 (six) hours as needed for wheezing or shortness of breath.   allopurinol (ZYLOPRIM) 300 MG tablet Take 1 tablet (300 mg total) by mouth daily.   amLODipine-benazepril (LOTREL) 5-20 MG capsule Take 1 capsule by mouth daily. (Patient taking differently: Take 1 capsule by mouth as needed.)   Fluticasone-Umeclidin-Vilant (TRELEGY ELLIPTA) 100-62.5-25 MCG/ACT AEPB Trelegy Ellipta 100-62.5-25 MCG/INH Inhalation Aerosol Powder Breath Activated QTY: 90 each Days: 90 Refills: 1  Written: 11/02/20 Patient Instructions: One puff once a day   furosemide (LASIX) 20 MG tablet Take 20 mg by mouth as needed.   lidocaine-prilocaine (EMLA) cream Apply 1 Application topically as directed. Apply small amount of cream to port a cath site 1-2 hours prior to port being accessed.   loratadine (CLARITIN) 10 MG tablet Take 1 tablet (10 mg total) by mouth daily.   magic mouthwash SOLN Take 5 mLs by mouth 4 (  four) times daily as needed.   magic mouthwash w/lidocaine SOLN Take 5 mLs by mouth 4 (four) times daily as needed for mouth pain. Sig: Swish/Spit 5-10 ml four times a day as needed. Dispense 480 ml. 1RF   nicotine polacrilex (NICORETTE) 2 MG gum Take 1 each (2 mg total) by mouth  every 3 (three) hours as needed for smoking cessation.   pantoprazole (PROTONIX) 40 MG tablet Take 1 tablet (40 mg total) by mouth daily.   rosuvastatin (CRESTOR) 20 MG tablet TAKE 1 TABLET BY MOUTH EVERY DAY   tadalafil (CIALIS) 20 MG tablet Take 1 tablet (20 mg total) by mouth daily.   Vitamin D, Ergocalciferol, (DRISDOL) 1.25 MG (50000 UNIT) CAPS capsule Take 50,000 Units by mouth once a week.   Facility-Administered Encounter Medications as of 12/27/2022  Medication   sodium chloride flush (NS) 0.9 % injection 10 mL    Past Medical History:  Diagnosis Date   COPD (chronic obstructive pulmonary disease) (HCC)    ED (erectile dysfunction)    GERD (gastroesophageal reflux disease)    Gout    Hypertension    Multilevel degenerative disc disease    Pneumonia 11/29/2017   Prostate cancer (HCC)    Sleep apnea    CPAP   Tachycardia    Wears dentures    partial upper and lower    Past Surgical History:  Procedure Laterality Date   ABCESS DRAINAGE  2009   tonsil   COLONOSCOPY WITH PROPOFOL N/A 02/11/2018   Procedure: COLONOSCOPY WITH PROPOFOL;  Surgeon: Toledo, Boykin Nearing, MD;  Location: [facility];  Service: Endoscopy;  Laterality: N/A;   ESOPHAGOGASTRODUODENOSCOPY (EGD) WITH PROPOFOL N/A 02/11/2018   Procedure: ESOPHAGOGASTRODUODENOSCOPY (EGD) WITH PROPOFOL;  Surgeon: Toledo, Boykin Nearing, MD;  Location: [facility] END[facility]COPY;  Service: Endoscopy;  Laterality: N/A;   IR IMAGING GUIDED PORT INSERTION  04/09/2022   MASS EXCISION Right 08/14/2017   Procedure: EXCISION RIGHT TEMPORAL MASS SUBMENTAL MASS;  Surgeon: Bud Face, MD;  Location[facility];  Service: ENT;  Laterality: Right;  sleep apnea   RADIOACTIVE SEED IMPLANT N/A 01/26/2019   Procedure: RADIOACTIVE SEED IMPLANT/BRACHYTHERAPY IMPLANT;  Surgeon: [name], MD;  Location: [facility] ORS;  Service: Urology;  Laterality: N/A;   SEPTOPLASTY  2016   [place]   VOLUME STUDY N/A 12/29/2018   Procedure: VOLUME STUDY;   Surgeon: [name], MD;  Location: [facility] OR[facility];  Service: Urology;  Laterality: N/A;    Family History  Problem Relation Age of Onset   Anxiety disorder Mother    Cancer Father    Hypertension Sister    Diabetes Sister    Asthma Sister    Other Brother        mva  at age 53   Gout Brother    Hypertension Brother    Prostate cancer Maternal Uncle    Prostate cancer Maternal Uncle     Social History   Socioeconomic History   Marital status: Significant Other    Spouse name: Not on file   Number of children: Not on file   Years of education: Not on file   Highest education level: GED or equivalent  Occupational History   Not on file  Tobacco Use   Smoking status: Every Day    Packs/day: 1.00    Years: 40.00    Additional pack years: 0.00    Total pack years: 40.00    Types: Cigarettes   Smokeless tobacco: Former    Types: Snuff,  Dorna Bloom    Quit date: 12/05/1984   Tobacco comments:    since age 6  Vaping Use   Vaping Use: Never used  Substance and Sexual Activity   Alcohol use: Not Currently    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   Drug use: Not Currently   Sexual activity: Yes  Other Topics Concern   Not on file  Social History Narrative   Not on file   Social Determinants of Health   Financial Resource Strain: Low Risk  (12/24/2022)   Overall Financial Resource Strain (CARDIA)    Difficulty of Paying Living Expenses: Not very hard  Food Insecurity: Food Insecurity Present (12/24/2022)   Hunger Vital Sign    Worried About Running Out of Food in the Last Year: Sometimes true    Ran Out of Food in the Last Year: Sometimes true  Transportation Needs: No Transportation Needs (12/24/2022)   PRAPARE - Administrator, Civil Service (Medical): No    Lack of Transportation (Non-Medical): No  Physical Activity: Inactive (12/24/2022)   Exercise Vital Sign    Days of Exercise per Week: 0 days    Minutes of Exercise per Session: 0 min   Stress: No Stress Concern Present (12/24/2022)   [employer] of Occupational Health - Occupational Stress Questionnaire    Feeling of Stress : Not at all  Social Connections: Moderately Isolated (12/24/2022)   Social Connection and Isolation Panel [NHANES]    Frequency of Communication with Friends and Family: Three times a week    Frequency of Social Gatherings with Friends and Family: More than three times a week    Attends Religious Services: 1 to 4 times per year    Active Member of [employer] or Organizations: No    Attends Banker Meetings: Not on file    Marital Status: Divorced  Intimate Partner Violence: Not At Risk (04/03/2022)   Humiliation, Afraid, Rape, and Kick questionnaire    Fear of Current or Ex-Partner: No    Emotionally Abused: No    Physically Abused: No    Sexually Abused: No    Review of Systems  Constitutional:  Negative for chills and fever.  Respiratory:  Negative for cough, shortness of breath and wheezing.   Cardiovascular:  Negative for chest pain.  Gastrointestinal:  Negative for abdominal pain.  Neurological:  Negative for headaches.      Objective    There were no vitals taken for this visit.  Physical Exam Constitutional:      Appearance: Normal appearance.  HENT:     Head: Normocephalic and atraumatic.     Mouth/Throat:     Mouth: Mucous membranes are moist.     Pharynx: Oropharynx is clear.  Eyes:     Conjunctiva/sclera: Conjunctivae normal.  Cardiovascular:     Rate and Rhythm: Normal rate and regular rhythm.  Pulmonary:     Effort: Pulmonary effort is normal.     Breath sounds: Normal breath sounds.  Skin:    General: Skin is warm and dry.  Neurological:     General: No focal deficit present.     Mental Status: He is alert. Mental status is at baseline.  Psychiatric:        Mood and Affect: Mood normal.        Behavior: Behavior normal.     Last CBC Lab Results  Component Value Date   WBC 6.2 10/15/2022    HGB 12.8 (L) 10/15/2022  HCT 39.7 10/15/2022   MCV 79.6 (L) 10/15/2022   MCH 25.7 (L) 10/15/2022   RDW 15.6 (H) 10/15/2022   PLT 309 10/15/2022   Last metabolic panel Lab Results  Component Value Date   GLUCOSE 117 (H) 10/15/2022   NA 132 (L) 10/15/2022   K 3.8 10/15/2022   CL 100 10/15/2022   CO2 24 10/15/2022   BUN 14 10/15/2022   CREATININE 0.78 10/15/2022   GFRNONAA >60 10/15/2022   CALCIUM 9.0 10/15/2022   PROT 7.7 10/15/2022   ALBUMIN 4.4 10/15/2022   BILITOT 0.3 10/15/2022   ALKPHOS 70 10/15/2022   AST 18 10/15/2022   ALT 31 10/15/2022   ANIONGAP 8 10/15/2022   Last lipids Lab Results  Component Value Date   CHOL 152 08/01/2022   HDL 54 08/01/2022   LDLCALC 82 08/01/2022   TRIG 81 08/01/2022   CHOLHDL 2.8 08/01/2022   Last hemoglobin A1c Lab Results  Component Value Date   HGBA1C 5.7 (H) 08/01/2022   Last thyroid functions Lab Results  Component Value Date   TSH 0.978 07/05/2022   Last vitamin D No results found for: "25OHVITD2", "25OHVITD3", "VD25OH" Last vitamin B12 and Folate No results found for: "VITAMINB12", "FOLATE"      Assessment & Plan:   1. Essential hypertension: Blood pressure stable, good today and has not been on medication. Discontinue Amlodipine-Benazepril 5-20 mg altogether. Can take if BP >140/90. Follow up here in 3 months to recheck.  2. Sinus pressure: Will prescribe Medrol dose pack for ongoing sinus pressure changes.   - methylPREDNISolone (MEDROL DOSEPAK) 4 MG TBPK tablet; Day 1: Take 8 mg (2 tablets) before breakfast, 4 mg (1 tablet) after lunch, 4 mg (1 tablet) after supper, and 8 mg (2 tablets) at bedtime. Day 2:Take 4 mg (1 tablet) before breakfast, 4 mg (1 tablet) after lunch, 4 mg (1 tablet) after supper, and 8 mg (2 tablets) at bedtime. Day 3: Take 4 mg (1 tablet) before breakfast, 4 mg (1 tablet) after lunch, 4 mg (1 tablet) after supper, and 4 mg (1 tablet) at bedtime. Day 4: Take 4 mg (1 tablet) before breakfast, 4  mg (1 tablet) after lunch, and 4 mg (1 tablet) at bedtime. Day 5: Take 4 mg (1 tablet) before breakfast and 4 mg (1 tablet) at bedtime. Day 6: Take 4 mg (1 tablet) before breakfast.  Dispense: 1 each; Refill: 0   No follow-ups on file.   [name], DO

## 2022-12-26 ENCOUNTER — Ambulatory Visit: Payer: Medicaid Other | Admitting: Primary Care

## 2022-12-26 DIAGNOSIS — C099 Malignant neoplasm of tonsil, unspecified: Secondary | ICD-10-CM | POA: Diagnosis not present

## 2022-12-26 DIAGNOSIS — Z7689 Persons encountering health services in other specified circumstances: Secondary | ICD-10-CM | POA: Diagnosis not present

## 2022-12-27 ENCOUNTER — Other Ambulatory Visit: Payer: Self-pay

## 2022-12-27 ENCOUNTER — Encounter: Payer: Self-pay | Admitting: Internal Medicine

## 2022-12-27 ENCOUNTER — Ambulatory Visit: Payer: Medicaid Other | Admitting: Internal Medicine

## 2022-12-27 ENCOUNTER — Other Ambulatory Visit: Payer: Self-pay | Admitting: *Deleted

## 2022-12-27 ENCOUNTER — Ambulatory Visit (INDEPENDENT_AMBULATORY_CARE_PROVIDER_SITE_OTHER): Payer: Medicaid Other | Admitting: Internal Medicine

## 2022-12-27 VITALS — BP 132/70 | HR 98 | Temp 98.0°F | Resp 18 | Ht 71.0 in | Wt 161.0 lb

## 2022-12-27 DIAGNOSIS — J449 Chronic obstructive pulmonary disease, unspecified: Secondary | ICD-10-CM

## 2022-12-27 DIAGNOSIS — I10 Essential (primary) hypertension: Secondary | ICD-10-CM

## 2022-12-27 DIAGNOSIS — Z510 Encounter for antineoplastic radiation therapy: Secondary | ICD-10-CM | POA: Diagnosis not present

## 2022-12-27 DIAGNOSIS — E782 Mixed hyperlipidemia: Secondary | ICD-10-CM | POA: Diagnosis not present

## 2022-12-27 DIAGNOSIS — Z7689 Persons encountering health services in other specified circumstances: Secondary | ICD-10-CM | POA: Diagnosis not present

## 2022-12-27 DIAGNOSIS — K219 Gastro-esophageal reflux disease without esophagitis: Secondary | ICD-10-CM | POA: Diagnosis not present

## 2022-12-27 DIAGNOSIS — C099 Malignant neoplasm of tonsil, unspecified: Secondary | ICD-10-CM | POA: Diagnosis not present

## 2022-12-27 DIAGNOSIS — C61 Malignant neoplasm of prostate: Secondary | ICD-10-CM

## 2022-12-27 DIAGNOSIS — Z5112 Encounter for antineoplastic immunotherapy: Secondary | ICD-10-CM | POA: Diagnosis not present

## 2022-12-27 MED ORDER — FLUTICASONE-UMECLIDIN-VILANT 100-62.5-25 MCG/ACT IN AEPB
1.0000 | INHALATION_SPRAY | Freq: Every day | RESPIRATORY_TRACT | 3 refills | Status: DC
Start: 2022-12-27 — End: 2023-03-29

## 2022-12-28 ENCOUNTER — Other Ambulatory Visit: Payer: Medicaid Other

## 2022-12-28 ENCOUNTER — Other Ambulatory Visit: Payer: Self-pay | Admitting: Internal Medicine

## 2022-12-28 DIAGNOSIS — C61 Malignant neoplasm of prostate: Secondary | ICD-10-CM

## 2022-12-28 DIAGNOSIS — Z5112 Encounter for antineoplastic immunotherapy: Secondary | ICD-10-CM | POA: Diagnosis not present

## 2022-12-28 DIAGNOSIS — B370 Candidal stomatitis: Secondary | ICD-10-CM

## 2022-12-28 DIAGNOSIS — C099 Malignant neoplasm of tonsil, unspecified: Secondary | ICD-10-CM | POA: Diagnosis not present

## 2022-12-28 DIAGNOSIS — Z510 Encounter for antineoplastic radiation therapy: Secondary | ICD-10-CM | POA: Diagnosis not present

## 2022-12-28 DIAGNOSIS — Z7689 Persons encountering health services in other specified circumstances: Secondary | ICD-10-CM | POA: Diagnosis not present

## 2022-12-28 MED ORDER — NYSTATIN 100000 UNIT/ML MT SUSP
5.0000 mL | Freq: Four times a day (QID) | OROMUCOSAL | 0 refills | Status: DC
Start: 2022-12-28 — End: 2023-01-10

## 2022-12-29 ENCOUNTER — Other Ambulatory Visit: Payer: Self-pay | Admitting: Internal Medicine

## 2022-12-29 DIAGNOSIS — I10 Essential (primary) hypertension: Secondary | ICD-10-CM

## 2022-12-29 LAB — PSA: Prostate Specific Ag, Serum: 0.3 ng/mL (ref 0.0–4.0)

## 2022-12-31 DIAGNOSIS — G4733 Obstructive sleep apnea (adult) (pediatric): Secondary | ICD-10-CM | POA: Diagnosis not present

## 2022-12-31 DIAGNOSIS — C099 Malignant neoplasm of tonsil, unspecified: Secondary | ICD-10-CM | POA: Diagnosis not present

## 2022-12-31 DIAGNOSIS — Z7689 Persons encountering health services in other specified circumstances: Secondary | ICD-10-CM | POA: Diagnosis not present

## 2022-12-31 NOTE — Telephone Encounter (Signed)
D/C 12/27/22. Requested Prescriptions  Refused Prescriptions Disp Refills   amLODipine-benazepril (LOTREL) 5-20 MG capsule [Pharmacy Med Name: AMLODIPINE-BENAZEPRIL 5-20 MG] 90 capsule 0    Sig: TAKE 1 CAPSULE BY MOUTH EVERY DAY     Cardiovascular: CCB + ACEI Combos Failed - 12/29/2022 11:34 AM      Failed - Na in normal range and within 180 days    Sodium  Date Value Ref Range Status  10/15/2022 132 (L) 135 - 145 mmol/L Final  10/03/2012 138 136 - 145 mmol/L Final         Passed - Cr in normal range and within 180 days    Creatinine  Date Value Ref Range Status  10/03/2012 1.04 0.60 - 1.30 mg/dL Final   Creatinine, Ser  Date Value Ref Range Status  10/15/2022 0.78 0.61 - 1.24 mg/dL Final         Passed - K in normal range and within 180 days    Potassium  Date Value Ref Range Status  10/15/2022 3.8 3.5 - 5.1 mmol/L Final  10/03/2012 3.5 3.5 - 5.1 mmol/L Final         Passed - eGFR is 30 or above and within 180 days    EGFR (African American)  Date Value Ref Range Status  10/03/2012 >60  Final   GFR calc Af Amer  Date Value Ref Range Status  11/29/2017 >60 >60 mL/min Final    Comment:    (NOTE) The eGFR has been calculated using the CKD EPI equation. This calculation has not been validated in all clinical situations. eGFR's persistently <60 mL/min signify possible Chronic Kidney Disease.    EGFR (Non-African Amer.)  Date Value Ref Range Status  10/03/2012 >60  Final    Comment:    eGFR values <73mL/min/1.73 m2 may be an indication of chronic kidney disease (CKD). Calculated eGFR is useful in patients with stable renal function. The eGFR calculation will not be reliable in acutely ill patients when serum creatinine is changing rapidly. It is not useful in  patients on dialysis. The eGFR calculation may not be applicable to patients at the low and high extremes of body sizes, pregnant women, and vegetarians.    GFR, Estimated  Date Value Ref Range Status   10/15/2022 >60 >60 mL/min Final    Comment:    (NOTE) Calculated using the CKD-EPI Creatinine Equation (2021)          Passed - Patient is not pregnant      Passed - Last BP in normal range    BP Readings from Last 1 Encounters:  12/27/22 132/70         Passed - Valid encounter within last 6 months    Recent Outpatient Visits           4 days ago Essential hypertension   [facility] [name], DO   3 months ago Essential hypertension   [facility] [name], DO   4 months ago Essential hypertension   [facility] [name], DO   5 months ago Chronic obstructive pulmonary disease, unspecified COPD type [facility])   [facility] [name], DO       Future Appointments             In 2 months [name], DO [facility], [facility]

## 2023-01-01 DIAGNOSIS — C099 Malignant neoplasm of tonsil, unspecified: Secondary | ICD-10-CM | POA: Diagnosis not present

## 2023-01-01 DIAGNOSIS — I10 Essential (primary) hypertension: Secondary | ICD-10-CM | POA: Diagnosis not present

## 2023-01-01 DIAGNOSIS — C4492 Squamous cell carcinoma of skin, unspecified: Secondary | ICD-10-CM | POA: Diagnosis not present

## 2023-01-01 DIAGNOSIS — Z7689 Persons encountering health services in other specified circumstances: Secondary | ICD-10-CM | POA: Diagnosis not present

## 2023-01-01 DIAGNOSIS — J449 Chronic obstructive pulmonary disease, unspecified: Secondary | ICD-10-CM | POA: Diagnosis not present

## 2023-01-01 DIAGNOSIS — Z5112 Encounter for antineoplastic immunotherapy: Secondary | ICD-10-CM | POA: Diagnosis not present

## 2023-01-02 DIAGNOSIS — Z5112 Encounter for antineoplastic immunotherapy: Secondary | ICD-10-CM | POA: Diagnosis not present

## 2023-01-02 DIAGNOSIS — C099 Malignant neoplasm of tonsil, unspecified: Secondary | ICD-10-CM | POA: Diagnosis not present

## 2023-01-02 DIAGNOSIS — Z510 Encounter for antineoplastic radiation therapy: Secondary | ICD-10-CM | POA: Diagnosis not present

## 2023-01-02 DIAGNOSIS — Z7689 Persons encountering health services in other specified circumstances: Secondary | ICD-10-CM | POA: Diagnosis not present

## 2023-01-03 DIAGNOSIS — Z5112 Encounter for antineoplastic immunotherapy: Secondary | ICD-10-CM | POA: Diagnosis not present

## 2023-01-03 DIAGNOSIS — C099 Malignant neoplasm of tonsil, unspecified: Secondary | ICD-10-CM | POA: Diagnosis not present

## 2023-01-03 DIAGNOSIS — Z7689 Persons encountering health services in other specified circumstances: Secondary | ICD-10-CM | POA: Diagnosis not present

## 2023-01-03 DIAGNOSIS — Z510 Encounter for antineoplastic radiation therapy: Secondary | ICD-10-CM | POA: Diagnosis not present

## 2023-01-04 DIAGNOSIS — Z5112 Encounter for antineoplastic immunotherapy: Secondary | ICD-10-CM | POA: Diagnosis not present

## 2023-01-04 DIAGNOSIS — Z510 Encounter for antineoplastic radiation therapy: Secondary | ICD-10-CM | POA: Diagnosis not present

## 2023-01-04 DIAGNOSIS — C099 Malignant neoplasm of tonsil, unspecified: Secondary | ICD-10-CM | POA: Diagnosis not present

## 2023-01-04 DIAGNOSIS — Z7689 Persons encountering health services in other specified circumstances: Secondary | ICD-10-CM | POA: Diagnosis not present

## 2023-01-05 DIAGNOSIS — Z7689 Persons encountering health services in other specified circumstances: Secondary | ICD-10-CM | POA: Diagnosis not present

## 2023-01-07 DIAGNOSIS — Z5112 Encounter for antineoplastic immunotherapy: Secondary | ICD-10-CM | POA: Diagnosis not present

## 2023-01-07 DIAGNOSIS — Z510 Encounter for antineoplastic radiation therapy: Secondary | ICD-10-CM | POA: Diagnosis not present

## 2023-01-07 DIAGNOSIS — Z7689 Persons encountering health services in other specified circumstances: Secondary | ICD-10-CM | POA: Diagnosis not present

## 2023-01-07 DIAGNOSIS — C099 Malignant neoplasm of tonsil, unspecified: Secondary | ICD-10-CM | POA: Diagnosis not present

## 2023-01-08 DIAGNOSIS — C099 Malignant neoplasm of tonsil, unspecified: Secondary | ICD-10-CM | POA: Diagnosis not present

## 2023-01-08 DIAGNOSIS — L270 Generalized skin eruption due to drugs and medicaments taken internally: Secondary | ICD-10-CM | POA: Diagnosis not present

## 2023-01-08 DIAGNOSIS — F17210 Nicotine dependence, cigarettes, uncomplicated: Secondary | ICD-10-CM | POA: Diagnosis not present

## 2023-01-08 DIAGNOSIS — Z792 Long term (current) use of antibiotics: Secondary | ICD-10-CM | POA: Diagnosis not present

## 2023-01-08 DIAGNOSIS — Z79899 Other long term (current) drug therapy: Secondary | ICD-10-CM | POA: Diagnosis not present

## 2023-01-08 DIAGNOSIS — G893 Neoplasm related pain (acute) (chronic): Secondary | ICD-10-CM | POA: Diagnosis not present

## 2023-01-08 DIAGNOSIS — Z7689 Persons encountering health services in other specified circumstances: Secondary | ICD-10-CM | POA: Diagnosis not present

## 2023-01-08 DIAGNOSIS — Z5112 Encounter for antineoplastic immunotherapy: Secondary | ICD-10-CM | POA: Diagnosis not present

## 2023-01-08 DIAGNOSIS — R21 Rash and other nonspecific skin eruption: Secondary | ICD-10-CM | POA: Diagnosis not present

## 2023-01-08 DIAGNOSIS — H9190 Unspecified hearing loss, unspecified ear: Secondary | ICD-10-CM | POA: Diagnosis not present

## 2023-01-08 DIAGNOSIS — Z510 Encounter for antineoplastic radiation therapy: Secondary | ICD-10-CM | POA: Diagnosis not present

## 2023-01-08 DIAGNOSIS — Z7951 Long term (current) use of inhaled steroids: Secondary | ICD-10-CM | POA: Diagnosis not present

## 2023-01-08 DIAGNOSIS — I10 Essential (primary) hypertension: Secondary | ICD-10-CM | POA: Diagnosis not present

## 2023-01-08 DIAGNOSIS — J449 Chronic obstructive pulmonary disease, unspecified: Secondary | ICD-10-CM | POA: Diagnosis not present

## 2023-01-08 DIAGNOSIS — Z87891 Personal history of nicotine dependence: Secondary | ICD-10-CM | POA: Diagnosis not present

## 2023-01-09 DIAGNOSIS — Z7689 Persons encountering health services in other specified circumstances: Secondary | ICD-10-CM | POA: Diagnosis not present

## 2023-01-09 DIAGNOSIS — C099 Malignant neoplasm of tonsil, unspecified: Secondary | ICD-10-CM | POA: Diagnosis not present

## 2023-01-10 ENCOUNTER — Ambulatory Visit: Payer: Medicaid Other | Admitting: Urology

## 2023-01-10 ENCOUNTER — Telehealth: Payer: Self-pay | Admitting: Primary Care

## 2023-01-10 ENCOUNTER — Encounter: Payer: Self-pay | Admitting: Urology

## 2023-01-10 VITALS — BP 114/72 | HR 93 | Ht 71.0 in | Wt 161.0 lb

## 2023-01-10 DIAGNOSIS — N529 Male erectile dysfunction, unspecified: Secondary | ICD-10-CM

## 2023-01-10 DIAGNOSIS — C61 Malignant neoplasm of prostate: Secondary | ICD-10-CM | POA: Diagnosis not present

## 2023-01-10 DIAGNOSIS — C099 Malignant neoplasm of tonsil, unspecified: Secondary | ICD-10-CM | POA: Diagnosis not present

## 2023-01-10 DIAGNOSIS — Z5112 Encounter for antineoplastic immunotherapy: Secondary | ICD-10-CM | POA: Diagnosis not present

## 2023-01-10 DIAGNOSIS — Z510 Encounter for antineoplastic radiation therapy: Secondary | ICD-10-CM | POA: Diagnosis not present

## 2023-01-10 DIAGNOSIS — G4733 Obstructive sleep apnea (adult) (pediatric): Secondary | ICD-10-CM

## 2023-01-10 DIAGNOSIS — Z7689 Persons encountering health services in other specified circumstances: Secondary | ICD-10-CM | POA: Diagnosis not present

## 2023-01-10 MED ORDER — TADALAFIL 20 MG PO TABS
20.0000 mg | ORAL_TABLET | Freq: Every day | ORAL | 11 refills | Status: AC
Start: 2023-01-10 — End: ?

## 2023-01-10 NOTE — Progress Notes (Signed)
   01/10/2023 2:32 PM   Clarence Skinner 04-01-1961 [id number]  Reason for visit: Follow up prostate cancer, ED   HPI: I saw [name] in urology clinic for the above issues.  He is a 62 year old African-American male who was diagnosed with favorable intermediate risk prostate cancer in January 2020 for an elevated PSA of 8.  His brachytherapy was slightly delayed secondary to the Covid pandemic, but he ultimately underwent brachytherapy seed implant with myself and Dr. Rushie Chestnut on 01/26/2019.  PSA on 06/05/2019 was undetectable.  PSA has remained low, most recently 0.3 in June 2024 which is unchanged from 0.3 in May 2023.  Denies any urinary complaints today.  He is taking Cialis 20 mg daily for ED with moderate results, and some persistent difficulty maintaining erections.  I recommended trying a penile ring with erections to help with maintaining.  I also offered him a trial of sildenafil, but he would like to stay with the Cialis.  He is not interested in penile injections.   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  I reviewed those records at length.  I also personally reviewed and interpreted the PET scan dated 08/22/2022 that shows brachytherapy seeds in the prostate, no urologic abnormalities.  Cialis 20 mg daily for ED, refilled RTC 1 year with PSA prior  [name], MD  [facility] [address] [place], [place] [address] [phone]

## 2023-01-10 NOTE — Telephone Encounter (Signed)
Pt is asking for a change from full face mask to the one that cradles under the nose

## 2023-01-10 NOTE — Telephone Encounter (Signed)
Called and spoke to patient.  He currently has a full face mask. He would like to try nasal pillows.   Beth, please advise if okay to order. Thanks

## 2023-01-11 DIAGNOSIS — Z510 Encounter for antineoplastic radiation therapy: Secondary | ICD-10-CM | POA: Diagnosis not present

## 2023-01-11 DIAGNOSIS — C099 Malignant neoplasm of tonsil, unspecified: Secondary | ICD-10-CM | POA: Diagnosis not present

## 2023-01-11 DIAGNOSIS — Z7689 Persons encountering health services in other specified circumstances: Secondary | ICD-10-CM | POA: Diagnosis not present

## 2023-01-11 DIAGNOSIS — Z5112 Encounter for antineoplastic immunotherapy: Secondary | ICD-10-CM | POA: Diagnosis not present

## 2023-01-14 DIAGNOSIS — Z5112 Encounter for antineoplastic immunotherapy: Secondary | ICD-10-CM | POA: Diagnosis not present

## 2023-01-14 DIAGNOSIS — Z7689 Persons encountering health services in other specified circumstances: Secondary | ICD-10-CM | POA: Diagnosis not present

## 2023-01-14 DIAGNOSIS — C099 Malignant neoplasm of tonsil, unspecified: Secondary | ICD-10-CM | POA: Diagnosis not present

## 2023-01-14 DIAGNOSIS — Z510 Encounter for antineoplastic radiation therapy: Secondary | ICD-10-CM | POA: Diagnosis not present

## 2023-01-15 DIAGNOSIS — Z7689 Persons encountering health services in other specified circumstances: Secondary | ICD-10-CM | POA: Diagnosis not present

## 2023-01-15 DIAGNOSIS — C099 Malignant neoplasm of tonsil, unspecified: Secondary | ICD-10-CM | POA: Diagnosis not present

## 2023-01-16 ENCOUNTER — Ambulatory Visit: Payer: Medicaid Other | Admitting: Radiation Oncology

## 2023-01-16 DIAGNOSIS — Z7689 Persons encountering health services in other specified circumstances: Secondary | ICD-10-CM | POA: Diagnosis not present

## 2023-01-16 DIAGNOSIS — C099 Malignant neoplasm of tonsil, unspecified: Secondary | ICD-10-CM | POA: Diagnosis not present

## 2023-01-16 DIAGNOSIS — Z5112 Encounter for antineoplastic immunotherapy: Secondary | ICD-10-CM | POA: Diagnosis not present

## 2023-01-16 DIAGNOSIS — Z510 Encounter for antineoplastic radiation therapy: Secondary | ICD-10-CM | POA: Diagnosis not present

## 2023-01-16 NOTE — Telephone Encounter (Signed)
Order placed to adapt for nasal pillows.  Patient is aware and voiced his understanding.  Nothing further needed.

## 2023-01-16 NOTE — Addendum Note (Signed)
Addended by: Lajoyce Lauber A on: 01/16/2023 10:18 AM   Modules accepted: Orders

## 2023-01-16 NOTE — Telephone Encounter (Signed)
That's fine, can you please place order

## 2023-01-16 NOTE — Telephone Encounter (Signed)
Beth, please advise. Thanks 

## 2023-01-17 DIAGNOSIS — C099 Malignant neoplasm of tonsil, unspecified: Secondary | ICD-10-CM | POA: Diagnosis not present

## 2023-01-17 DIAGNOSIS — Z5112 Encounter for antineoplastic immunotherapy: Secondary | ICD-10-CM | POA: Diagnosis not present

## 2023-01-17 DIAGNOSIS — Z7689 Persons encountering health services in other specified circumstances: Secondary | ICD-10-CM | POA: Diagnosis not present

## 2023-01-17 DIAGNOSIS — Z510 Encounter for antineoplastic radiation therapy: Secondary | ICD-10-CM | POA: Diagnosis not present

## 2023-01-18 DIAGNOSIS — Z7689 Persons encountering health services in other specified circumstances: Secondary | ICD-10-CM | POA: Diagnosis not present

## 2023-01-18 DIAGNOSIS — Z5112 Encounter for antineoplastic immunotherapy: Secondary | ICD-10-CM | POA: Diagnosis not present

## 2023-01-18 DIAGNOSIS — Z510 Encounter for antineoplastic radiation therapy: Secondary | ICD-10-CM | POA: Diagnosis not present

## 2023-01-18 DIAGNOSIS — C099 Malignant neoplasm of tonsil, unspecified: Secondary | ICD-10-CM | POA: Diagnosis not present

## 2023-01-21 DIAGNOSIS — C410 Malignant neoplasm of bones of skull and face: Secondary | ICD-10-CM | POA: Diagnosis not present

## 2023-01-21 DIAGNOSIS — J029 Acute pharyngitis, unspecified: Secondary | ICD-10-CM | POA: Diagnosis not present

## 2023-01-21 DIAGNOSIS — G893 Neoplasm related pain (acute) (chronic): Secondary | ICD-10-CM | POA: Diagnosis not present

## 2023-01-21 DIAGNOSIS — Z510 Encounter for antineoplastic radiation therapy: Secondary | ICD-10-CM | POA: Diagnosis not present

## 2023-01-21 DIAGNOSIS — J449 Chronic obstructive pulmonary disease, unspecified: Secondary | ICD-10-CM | POA: Diagnosis not present

## 2023-01-21 DIAGNOSIS — H9190 Unspecified hearing loss, unspecified ear: Secondary | ICD-10-CM | POA: Diagnosis not present

## 2023-01-21 DIAGNOSIS — C61 Malignant neoplasm of prostate: Secondary | ICD-10-CM | POA: Diagnosis not present

## 2023-01-21 DIAGNOSIS — Z7689 Persons encountering health services in other specified circumstances: Secondary | ICD-10-CM | POA: Diagnosis not present

## 2023-01-21 DIAGNOSIS — M542 Cervicalgia: Secondary | ICD-10-CM | POA: Diagnosis not present

## 2023-01-21 DIAGNOSIS — R21 Rash and other nonspecific skin eruption: Secondary | ICD-10-CM | POA: Diagnosis not present

## 2023-01-21 DIAGNOSIS — G4733 Obstructive sleep apnea (adult) (pediatric): Secondary | ICD-10-CM | POA: Diagnosis not present

## 2023-01-21 DIAGNOSIS — Z419 Encounter for procedure for purposes other than remedying health state, unspecified: Secondary | ICD-10-CM | POA: Diagnosis not present

## 2023-01-21 DIAGNOSIS — R590 Localized enlarged lymph nodes: Secondary | ICD-10-CM | POA: Diagnosis not present

## 2023-01-21 DIAGNOSIS — Z923 Personal history of irradiation: Secondary | ICD-10-CM | POA: Diagnosis not present

## 2023-01-21 DIAGNOSIS — C099 Malignant neoplasm of tonsil, unspecified: Secondary | ICD-10-CM | POA: Diagnosis not present

## 2023-01-22 DIAGNOSIS — H9190 Unspecified hearing loss, unspecified ear: Secondary | ICD-10-CM | POA: Diagnosis not present

## 2023-01-22 DIAGNOSIS — M542 Cervicalgia: Secondary | ICD-10-CM | POA: Diagnosis not present

## 2023-01-22 DIAGNOSIS — J029 Acute pharyngitis, unspecified: Secondary | ICD-10-CM | POA: Diagnosis not present

## 2023-01-22 DIAGNOSIS — C61 Malignant neoplasm of prostate: Secondary | ICD-10-CM | POA: Diagnosis not present

## 2023-01-22 DIAGNOSIS — R21 Rash and other nonspecific skin eruption: Secondary | ICD-10-CM | POA: Diagnosis not present

## 2023-01-22 DIAGNOSIS — C099 Malignant neoplasm of tonsil, unspecified: Secondary | ICD-10-CM | POA: Diagnosis not present

## 2023-01-22 DIAGNOSIS — C410 Malignant neoplasm of bones of skull and face: Secondary | ICD-10-CM | POA: Diagnosis not present

## 2023-01-22 DIAGNOSIS — J449 Chronic obstructive pulmonary disease, unspecified: Secondary | ICD-10-CM | POA: Diagnosis not present

## 2023-01-22 DIAGNOSIS — Z7689 Persons encountering health services in other specified circumstances: Secondary | ICD-10-CM | POA: Diagnosis not present

## 2023-01-22 DIAGNOSIS — Z923 Personal history of irradiation: Secondary | ICD-10-CM | POA: Diagnosis not present

## 2023-01-22 DIAGNOSIS — R590 Localized enlarged lymph nodes: Secondary | ICD-10-CM | POA: Diagnosis not present

## 2023-01-22 DIAGNOSIS — C109 Malignant neoplasm of oropharynx, unspecified: Secondary | ICD-10-CM | POA: Diagnosis not present

## 2023-01-22 DIAGNOSIS — G893 Neoplasm related pain (acute) (chronic): Secondary | ICD-10-CM | POA: Diagnosis not present

## 2023-01-22 DIAGNOSIS — Z510 Encounter for antineoplastic radiation therapy: Secondary | ICD-10-CM | POA: Diagnosis not present

## 2023-01-23 DIAGNOSIS — J449 Chronic obstructive pulmonary disease, unspecified: Secondary | ICD-10-CM | POA: Diagnosis not present

## 2023-01-23 DIAGNOSIS — C410 Malignant neoplasm of bones of skull and face: Secondary | ICD-10-CM | POA: Diagnosis not present

## 2023-01-23 DIAGNOSIS — Z510 Encounter for antineoplastic radiation therapy: Secondary | ICD-10-CM | POA: Diagnosis not present

## 2023-01-23 DIAGNOSIS — M542 Cervicalgia: Secondary | ICD-10-CM | POA: Diagnosis not present

## 2023-01-23 DIAGNOSIS — C61 Malignant neoplasm of prostate: Secondary | ICD-10-CM | POA: Diagnosis not present

## 2023-01-23 DIAGNOSIS — Z923 Personal history of irradiation: Secondary | ICD-10-CM | POA: Diagnosis not present

## 2023-01-23 DIAGNOSIS — H9190 Unspecified hearing loss, unspecified ear: Secondary | ICD-10-CM | POA: Diagnosis not present

## 2023-01-23 DIAGNOSIS — J029 Acute pharyngitis, unspecified: Secondary | ICD-10-CM | POA: Diagnosis not present

## 2023-01-23 DIAGNOSIS — C099 Malignant neoplasm of tonsil, unspecified: Secondary | ICD-10-CM | POA: Diagnosis not present

## 2023-01-23 DIAGNOSIS — G893 Neoplasm related pain (acute) (chronic): Secondary | ICD-10-CM | POA: Diagnosis not present

## 2023-01-23 DIAGNOSIS — R590 Localized enlarged lymph nodes: Secondary | ICD-10-CM | POA: Diagnosis not present

## 2023-01-23 DIAGNOSIS — R21 Rash and other nonspecific skin eruption: Secondary | ICD-10-CM | POA: Diagnosis not present

## 2023-01-25 DIAGNOSIS — Z7689 Persons encountering health services in other specified circumstances: Secondary | ICD-10-CM | POA: Diagnosis not present

## 2023-01-25 DIAGNOSIS — J449 Chronic obstructive pulmonary disease, unspecified: Secondary | ICD-10-CM | POA: Diagnosis not present

## 2023-01-25 DIAGNOSIS — Z923 Personal history of irradiation: Secondary | ICD-10-CM | POA: Diagnosis not present

## 2023-01-25 DIAGNOSIS — C099 Malignant neoplasm of tonsil, unspecified: Secondary | ICD-10-CM | POA: Diagnosis not present

## 2023-01-25 DIAGNOSIS — R21 Rash and other nonspecific skin eruption: Secondary | ICD-10-CM | POA: Diagnosis not present

## 2023-01-25 DIAGNOSIS — C410 Malignant neoplasm of bones of skull and face: Secondary | ICD-10-CM | POA: Diagnosis not present

## 2023-01-25 DIAGNOSIS — Z510 Encounter for antineoplastic radiation therapy: Secondary | ICD-10-CM | POA: Diagnosis not present

## 2023-01-25 DIAGNOSIS — C61 Malignant neoplasm of prostate: Secondary | ICD-10-CM | POA: Diagnosis not present

## 2023-01-25 DIAGNOSIS — H9190 Unspecified hearing loss, unspecified ear: Secondary | ICD-10-CM | POA: Diagnosis not present

## 2023-01-25 DIAGNOSIS — G893 Neoplasm related pain (acute) (chronic): Secondary | ICD-10-CM | POA: Diagnosis not present

## 2023-01-25 DIAGNOSIS — M542 Cervicalgia: Secondary | ICD-10-CM | POA: Diagnosis not present

## 2023-01-25 DIAGNOSIS — R590 Localized enlarged lymph nodes: Secondary | ICD-10-CM | POA: Diagnosis not present

## 2023-01-25 DIAGNOSIS — J029 Acute pharyngitis, unspecified: Secondary | ICD-10-CM | POA: Diagnosis not present

## 2023-01-26 ENCOUNTER — Other Ambulatory Visit: Payer: Self-pay | Admitting: Internal Medicine

## 2023-01-26 DIAGNOSIS — Z7689 Persons encountering health services in other specified circumstances: Secondary | ICD-10-CM | POA: Diagnosis not present

## 2023-01-28 DIAGNOSIS — C099 Malignant neoplasm of tonsil, unspecified: Secondary | ICD-10-CM | POA: Diagnosis not present

## 2023-01-28 DIAGNOSIS — J029 Acute pharyngitis, unspecified: Secondary | ICD-10-CM | POA: Diagnosis not present

## 2023-01-28 DIAGNOSIS — C61 Malignant neoplasm of prostate: Secondary | ICD-10-CM | POA: Diagnosis not present

## 2023-01-28 DIAGNOSIS — H9190 Unspecified hearing loss, unspecified ear: Secondary | ICD-10-CM | POA: Diagnosis not present

## 2023-01-28 DIAGNOSIS — C410 Malignant neoplasm of bones of skull and face: Secondary | ICD-10-CM | POA: Diagnosis not present

## 2023-01-28 DIAGNOSIS — C109 Malignant neoplasm of oropharynx, unspecified: Secondary | ICD-10-CM | POA: Diagnosis not present

## 2023-01-28 DIAGNOSIS — R590 Localized enlarged lymph nodes: Secondary | ICD-10-CM | POA: Diagnosis not present

## 2023-01-28 DIAGNOSIS — Z510 Encounter for antineoplastic radiation therapy: Secondary | ICD-10-CM | POA: Diagnosis not present

## 2023-01-28 DIAGNOSIS — Z7689 Persons encountering health services in other specified circumstances: Secondary | ICD-10-CM | POA: Diagnosis not present

## 2023-01-28 DIAGNOSIS — J449 Chronic obstructive pulmonary disease, unspecified: Secondary | ICD-10-CM | POA: Diagnosis not present

## 2023-01-28 DIAGNOSIS — R21 Rash and other nonspecific skin eruption: Secondary | ICD-10-CM | POA: Diagnosis not present

## 2023-01-28 DIAGNOSIS — G893 Neoplasm related pain (acute) (chronic): Secondary | ICD-10-CM | POA: Diagnosis not present

## 2023-01-28 DIAGNOSIS — Z923 Personal history of irradiation: Secondary | ICD-10-CM | POA: Diagnosis not present

## 2023-01-28 DIAGNOSIS — M542 Cervicalgia: Secondary | ICD-10-CM | POA: Diagnosis not present

## 2023-01-28 NOTE — Telephone Encounter (Signed)
Requested Prescriptions  Pending Prescriptions Disp Refills   pantoprazole (PROTONIX) 40 MG tablet [Pharmacy Med Name: PANTOPRAZOLE SOD DR 40 MG TAB] 90 tablet 0    Sig: TAKE 1 TABLET BY MOUTH EVERY DAY     Gastroenterology: Proton Pump Inhibitors Passed - 01/26/2023  8:42 PM      Passed - Valid encounter within last 12 months    Recent Outpatient Visits           1 month ago Essential hypertension   [facility] [name], DO   4 months ago Essential hypertension   [facility] [name], DO   5 months ago Essential hypertension   [facility] [name], DO   6 months ago Chronic obstructive pulmonary disease, unspecified COPD type [facility])   [facility] [name], DO       Future Appointments             In 2 months [name], DO [place] [facility]   In 11 months Richardo Hanks, Laurette Schimke, MD [facility] [place]

## 2023-01-29 DIAGNOSIS — M542 Cervicalgia: Secondary | ICD-10-CM | POA: Diagnosis not present

## 2023-01-29 DIAGNOSIS — J029 Acute pharyngitis, unspecified: Secondary | ICD-10-CM | POA: Diagnosis not present

## 2023-01-29 DIAGNOSIS — I10 Essential (primary) hypertension: Secondary | ICD-10-CM | POA: Diagnosis not present

## 2023-01-29 DIAGNOSIS — J449 Chronic obstructive pulmonary disease, unspecified: Secondary | ICD-10-CM | POA: Diagnosis not present

## 2023-01-29 DIAGNOSIS — Z510 Encounter for antineoplastic radiation therapy: Secondary | ICD-10-CM | POA: Diagnosis not present

## 2023-01-29 DIAGNOSIS — C099 Malignant neoplasm of tonsil, unspecified: Secondary | ICD-10-CM | POA: Diagnosis not present

## 2023-01-29 DIAGNOSIS — Z79899 Other long term (current) drug therapy: Secondary | ICD-10-CM | POA: Diagnosis not present

## 2023-01-29 DIAGNOSIS — H9190 Unspecified hearing loss, unspecified ear: Secondary | ICD-10-CM | POA: Diagnosis not present

## 2023-01-29 DIAGNOSIS — Z923 Personal history of irradiation: Secondary | ICD-10-CM | POA: Diagnosis not present

## 2023-01-29 DIAGNOSIS — Z8249 Family history of ischemic heart disease and other diseases of the circulatory system: Secondary | ICD-10-CM | POA: Diagnosis not present

## 2023-01-29 DIAGNOSIS — Z7689 Persons encountering health services in other specified circumstances: Secondary | ICD-10-CM | POA: Diagnosis not present

## 2023-01-29 DIAGNOSIS — Z5111 Encounter for antineoplastic chemotherapy: Secondary | ICD-10-CM | POA: Diagnosis not present

## 2023-01-29 DIAGNOSIS — G4730 Sleep apnea, unspecified: Secondary | ICD-10-CM | POA: Diagnosis not present

## 2023-01-29 DIAGNOSIS — G893 Neoplasm related pain (acute) (chronic): Secondary | ICD-10-CM | POA: Diagnosis not present

## 2023-01-29 DIAGNOSIS — R21 Rash and other nonspecific skin eruption: Secondary | ICD-10-CM | POA: Diagnosis not present

## 2023-01-29 DIAGNOSIS — C410 Malignant neoplasm of bones of skull and face: Secondary | ICD-10-CM | POA: Diagnosis not present

## 2023-01-29 DIAGNOSIS — C61 Malignant neoplasm of prostate: Secondary | ICD-10-CM | POA: Diagnosis not present

## 2023-01-29 DIAGNOSIS — R590 Localized enlarged lymph nodes: Secondary | ICD-10-CM | POA: Diagnosis not present

## 2023-01-30 DIAGNOSIS — J449 Chronic obstructive pulmonary disease, unspecified: Secondary | ICD-10-CM | POA: Diagnosis not present

## 2023-01-30 DIAGNOSIS — G4733 Obstructive sleep apnea (adult) (pediatric): Secondary | ICD-10-CM | POA: Diagnosis not present

## 2023-01-30 DIAGNOSIS — G893 Neoplasm related pain (acute) (chronic): Secondary | ICD-10-CM | POA: Diagnosis not present

## 2023-01-30 DIAGNOSIS — J029 Acute pharyngitis, unspecified: Secondary | ICD-10-CM | POA: Diagnosis not present

## 2023-01-30 DIAGNOSIS — H9190 Unspecified hearing loss, unspecified ear: Secondary | ICD-10-CM | POA: Diagnosis not present

## 2023-01-30 DIAGNOSIS — R590 Localized enlarged lymph nodes: Secondary | ICD-10-CM | POA: Diagnosis not present

## 2023-01-30 DIAGNOSIS — C099 Malignant neoplasm of tonsil, unspecified: Secondary | ICD-10-CM | POA: Diagnosis not present

## 2023-01-30 DIAGNOSIS — C61 Malignant neoplasm of prostate: Secondary | ICD-10-CM | POA: Diagnosis not present

## 2023-01-30 DIAGNOSIS — Z510 Encounter for antineoplastic radiation therapy: Secondary | ICD-10-CM | POA: Diagnosis not present

## 2023-01-30 DIAGNOSIS — R21 Rash and other nonspecific skin eruption: Secondary | ICD-10-CM | POA: Diagnosis not present

## 2023-01-30 DIAGNOSIS — Z923 Personal history of irradiation: Secondary | ICD-10-CM | POA: Diagnosis not present

## 2023-01-30 DIAGNOSIS — Z7689 Persons encountering health services in other specified circumstances: Secondary | ICD-10-CM | POA: Diagnosis not present

## 2023-01-30 DIAGNOSIS — M542 Cervicalgia: Secondary | ICD-10-CM | POA: Diagnosis not present

## 2023-01-30 DIAGNOSIS — C410 Malignant neoplasm of bones of skull and face: Secondary | ICD-10-CM | POA: Diagnosis not present

## 2023-01-31 DIAGNOSIS — J029 Acute pharyngitis, unspecified: Secondary | ICD-10-CM | POA: Diagnosis not present

## 2023-01-31 DIAGNOSIS — M542 Cervicalgia: Secondary | ICD-10-CM | POA: Diagnosis not present

## 2023-01-31 DIAGNOSIS — Z7689 Persons encountering health services in other specified circumstances: Secondary | ICD-10-CM | POA: Diagnosis not present

## 2023-01-31 DIAGNOSIS — H9190 Unspecified hearing loss, unspecified ear: Secondary | ICD-10-CM | POA: Diagnosis not present

## 2023-01-31 DIAGNOSIS — C099 Malignant neoplasm of tonsil, unspecified: Secondary | ICD-10-CM | POA: Diagnosis not present

## 2023-01-31 DIAGNOSIS — C61 Malignant neoplasm of prostate: Secondary | ICD-10-CM | POA: Diagnosis not present

## 2023-01-31 DIAGNOSIS — C410 Malignant neoplasm of bones of skull and face: Secondary | ICD-10-CM | POA: Diagnosis not present

## 2023-01-31 DIAGNOSIS — J449 Chronic obstructive pulmonary disease, unspecified: Secondary | ICD-10-CM | POA: Diagnosis not present

## 2023-01-31 DIAGNOSIS — R590 Localized enlarged lymph nodes: Secondary | ICD-10-CM | POA: Diagnosis not present

## 2023-01-31 DIAGNOSIS — R21 Rash and other nonspecific skin eruption: Secondary | ICD-10-CM | POA: Diagnosis not present

## 2023-01-31 DIAGNOSIS — G893 Neoplasm related pain (acute) (chronic): Secondary | ICD-10-CM | POA: Diagnosis not present

## 2023-01-31 DIAGNOSIS — Z510 Encounter for antineoplastic radiation therapy: Secondary | ICD-10-CM | POA: Diagnosis not present

## 2023-01-31 DIAGNOSIS — Z923 Personal history of irradiation: Secondary | ICD-10-CM | POA: Diagnosis not present

## 2023-02-01 DIAGNOSIS — R590 Localized enlarged lymph nodes: Secondary | ICD-10-CM | POA: Diagnosis not present

## 2023-02-01 DIAGNOSIS — J029 Acute pharyngitis, unspecified: Secondary | ICD-10-CM | POA: Diagnosis not present

## 2023-02-01 DIAGNOSIS — C61 Malignant neoplasm of prostate: Secondary | ICD-10-CM | POA: Diagnosis not present

## 2023-02-01 DIAGNOSIS — G893 Neoplasm related pain (acute) (chronic): Secondary | ICD-10-CM | POA: Diagnosis not present

## 2023-02-01 DIAGNOSIS — H9190 Unspecified hearing loss, unspecified ear: Secondary | ICD-10-CM | POA: Diagnosis not present

## 2023-02-01 DIAGNOSIS — M542 Cervicalgia: Secondary | ICD-10-CM | POA: Diagnosis not present

## 2023-02-01 DIAGNOSIS — C410 Malignant neoplasm of bones of skull and face: Secondary | ICD-10-CM | POA: Diagnosis not present

## 2023-02-01 DIAGNOSIS — R21 Rash and other nonspecific skin eruption: Secondary | ICD-10-CM | POA: Diagnosis not present

## 2023-02-01 DIAGNOSIS — Z7689 Persons encountering health services in other specified circumstances: Secondary | ICD-10-CM | POA: Diagnosis not present

## 2023-02-01 DIAGNOSIS — J449 Chronic obstructive pulmonary disease, unspecified: Secondary | ICD-10-CM | POA: Diagnosis not present

## 2023-02-01 DIAGNOSIS — Z923 Personal history of irradiation: Secondary | ICD-10-CM | POA: Diagnosis not present

## 2023-02-01 DIAGNOSIS — C099 Malignant neoplasm of tonsil, unspecified: Secondary | ICD-10-CM | POA: Diagnosis not present

## 2023-02-01 DIAGNOSIS — Z510 Encounter for antineoplastic radiation therapy: Secondary | ICD-10-CM | POA: Diagnosis not present

## 2023-02-04 DIAGNOSIS — R590 Localized enlarged lymph nodes: Secondary | ICD-10-CM | POA: Diagnosis not present

## 2023-02-04 DIAGNOSIS — R21 Rash and other nonspecific skin eruption: Secondary | ICD-10-CM | POA: Diagnosis not present

## 2023-02-04 DIAGNOSIS — G893 Neoplasm related pain (acute) (chronic): Secondary | ICD-10-CM | POA: Diagnosis not present

## 2023-02-04 DIAGNOSIS — Z7689 Persons encountering health services in other specified circumstances: Secondary | ICD-10-CM | POA: Diagnosis not present

## 2023-02-04 DIAGNOSIS — J449 Chronic obstructive pulmonary disease, unspecified: Secondary | ICD-10-CM | POA: Diagnosis not present

## 2023-02-04 DIAGNOSIS — J029 Acute pharyngitis, unspecified: Secondary | ICD-10-CM | POA: Diagnosis not present

## 2023-02-04 DIAGNOSIS — H9190 Unspecified hearing loss, unspecified ear: Secondary | ICD-10-CM | POA: Diagnosis not present

## 2023-02-04 DIAGNOSIS — Z510 Encounter for antineoplastic radiation therapy: Secondary | ICD-10-CM | POA: Diagnosis not present

## 2023-02-04 DIAGNOSIS — M542 Cervicalgia: Secondary | ICD-10-CM | POA: Diagnosis not present

## 2023-02-04 DIAGNOSIS — C410 Malignant neoplasm of bones of skull and face: Secondary | ICD-10-CM | POA: Diagnosis not present

## 2023-02-04 DIAGNOSIS — C099 Malignant neoplasm of tonsil, unspecified: Secondary | ICD-10-CM | POA: Diagnosis not present

## 2023-02-04 DIAGNOSIS — C61 Malignant neoplasm of prostate: Secondary | ICD-10-CM | POA: Diagnosis not present

## 2023-02-04 DIAGNOSIS — Z923 Personal history of irradiation: Secondary | ICD-10-CM | POA: Diagnosis not present

## 2023-02-05 DIAGNOSIS — C61 Malignant neoplasm of prostate: Secondary | ICD-10-CM | POA: Diagnosis not present

## 2023-02-05 DIAGNOSIS — Z510 Encounter for antineoplastic radiation therapy: Secondary | ICD-10-CM | POA: Diagnosis not present

## 2023-02-05 DIAGNOSIS — J449 Chronic obstructive pulmonary disease, unspecified: Secondary | ICD-10-CM | POA: Diagnosis not present

## 2023-02-05 DIAGNOSIS — J029 Acute pharyngitis, unspecified: Secondary | ICD-10-CM | POA: Diagnosis not present

## 2023-02-05 DIAGNOSIS — R590 Localized enlarged lymph nodes: Secondary | ICD-10-CM | POA: Diagnosis not present

## 2023-02-05 DIAGNOSIS — C099 Malignant neoplasm of tonsil, unspecified: Secondary | ICD-10-CM | POA: Diagnosis not present

## 2023-02-05 DIAGNOSIS — G893 Neoplasm related pain (acute) (chronic): Secondary | ICD-10-CM | POA: Diagnosis not present

## 2023-02-05 DIAGNOSIS — M542 Cervicalgia: Secondary | ICD-10-CM | POA: Diagnosis not present

## 2023-02-05 DIAGNOSIS — Z7689 Persons encountering health services in other specified circumstances: Secondary | ICD-10-CM | POA: Diagnosis not present

## 2023-02-05 DIAGNOSIS — H9190 Unspecified hearing loss, unspecified ear: Secondary | ICD-10-CM | POA: Diagnosis not present

## 2023-02-05 DIAGNOSIS — R21 Rash and other nonspecific skin eruption: Secondary | ICD-10-CM | POA: Diagnosis not present

## 2023-02-05 DIAGNOSIS — C410 Malignant neoplasm of bones of skull and face: Secondary | ICD-10-CM | POA: Diagnosis not present

## 2023-02-05 DIAGNOSIS — Z923 Personal history of irradiation: Secondary | ICD-10-CM | POA: Diagnosis not present

## 2023-02-18 ENCOUNTER — Ambulatory Visit: Payer: Medicaid Other | Admitting: Primary Care

## 2023-02-19 DIAGNOSIS — C099 Malignant neoplasm of tonsil, unspecified: Secondary | ICD-10-CM | POA: Diagnosis not present

## 2023-02-19 DIAGNOSIS — Z7689 Persons encountering health services in other specified circumstances: Secondary | ICD-10-CM | POA: Diagnosis not present

## 2023-02-21 DIAGNOSIS — G4733 Obstructive sleep apnea (adult) (pediatric): Secondary | ICD-10-CM | POA: Diagnosis not present

## 2023-02-21 DIAGNOSIS — Z419 Encounter for procedure for purposes other than remedying health state, unspecified: Secondary | ICD-10-CM | POA: Diagnosis not present

## 2023-02-22 ENCOUNTER — Other Ambulatory Visit: Payer: Self-pay | Admitting: Internal Medicine

## 2023-02-25 NOTE — Telephone Encounter (Signed)
Requested Prescriptions  Pending Prescriptions Disp Refills   loratadine (CLARITIN) 10 MG tablet [Pharmacy Med Name: LORATADINE 10 MG TABLET] 90 tablet 2    Sig: TAKE 1 TABLET BY MOUTH EVERY DAY     Ear, Nose, and Throat:  Antihistamines 2 Passed - 02/22/2023  8:37 PM      Passed - Cr in normal range and within 360 days    Creatinine  Date Value Ref Range Status  10/03/2012 1.04 0.60 - 1.30 mg/dL Final   Creatinine, Ser  Date Value Ref Range Status  10/15/2022 0.78 0.61 - 1.24 mg/dL Final         Passed - Valid encounter within last 12 months    Recent Outpatient Visits           2 months ago Essential hypertension   [facility] [name], DO   5 months ago Essential hypertension   [facility] [name], DO   6 months ago Essential hypertension   [facility] [name], DO   6 months ago Chronic obstructive pulmonary disease, unspecified COPD type [facility])   [place] [facility] [name], DO       Future Appointments             In 1 month [name], DO [place] [facility]   In 10 months Richardo Hanks, Laurette Schimke, MD [facility] [place]

## 2023-02-26 DIAGNOSIS — C099 Malignant neoplasm of tonsil, unspecified: Secondary | ICD-10-CM | POA: Diagnosis not present

## 2023-03-01 ENCOUNTER — Other Ambulatory Visit: Payer: Self-pay | Admitting: Internal Medicine

## 2023-03-05 ENCOUNTER — Telehealth: Payer: Self-pay | Admitting: Internal Medicine

## 2023-03-05 NOTE — Telephone Encounter (Signed)
Clarence Skinner from [employer] called and stated they are trying to provide patient with BP monitor and stated they received the form for patient but did not recieve office notes, and need the office notes faxed back @ [phone].   Callback for [facility]: [phone]

## 2023-03-05 NOTE — Telephone Encounter (Signed)
Notes faxed.

## 2023-03-12 DIAGNOSIS — I10 Essential (primary) hypertension: Secondary | ICD-10-CM | POA: Diagnosis not present

## 2023-03-12 DIAGNOSIS — J449 Chronic obstructive pulmonary disease, unspecified: Secondary | ICD-10-CM | POA: Diagnosis not present

## 2023-03-14 DIAGNOSIS — C099 Malignant neoplasm of tonsil, unspecified: Secondary | ICD-10-CM | POA: Diagnosis not present

## 2023-03-18 ENCOUNTER — Inpatient Hospital Stay: Payer: Medicaid Other | Attending: Hospice and Palliative Medicine | Admitting: Hospice and Palliative Medicine

## 2023-03-18 DIAGNOSIS — C099 Malignant neoplasm of tonsil, unspecified: Secondary | ICD-10-CM

## 2023-03-18 NOTE — Progress Notes (Signed)
Spoke with patient by phone.  He is now established with [facility] for cancer treatment.  Says he is doing reasonably well denies any acute changes or concerns.

## 2023-03-22 DIAGNOSIS — C099 Malignant neoplasm of tonsil, unspecified: Secondary | ICD-10-CM | POA: Diagnosis not present

## 2023-03-22 DIAGNOSIS — R0981 Nasal congestion: Secondary | ICD-10-CM | POA: Diagnosis not present

## 2023-03-22 DIAGNOSIS — C4492 Squamous cell carcinoma of skin, unspecified: Secondary | ICD-10-CM | POA: Diagnosis not present

## 2023-03-22 DIAGNOSIS — Z7689 Persons encountering health services in other specified circumstances: Secondary | ICD-10-CM | POA: Diagnosis not present

## 2023-03-24 DIAGNOSIS — G4733 Obstructive sleep apnea (adult) (pediatric): Secondary | ICD-10-CM | POA: Diagnosis not present

## 2023-03-24 DIAGNOSIS — Z419 Encounter for procedure for purposes other than remedying health state, unspecified: Secondary | ICD-10-CM | POA: Diagnosis not present

## 2023-03-28 DIAGNOSIS — Z7689 Persons encountering health services in other specified circumstances: Secondary | ICD-10-CM | POA: Diagnosis not present

## 2023-03-28 DIAGNOSIS — C099 Malignant neoplasm of tonsil, unspecified: Secondary | ICD-10-CM | POA: Diagnosis not present

## 2023-03-28 DIAGNOSIS — C4492 Squamous cell carcinoma of skin, unspecified: Secondary | ICD-10-CM | POA: Diagnosis not present

## 2023-03-28 NOTE — Progress Notes (Signed)
Established Patient Office Visit  Subjective    Patient ID: Clarence Skinner, male    DOB: [birth year]  Age: 62 y.o. MRN: [id number]  CC:  Chief Complaint  Patient presents with   Follow-up    HPI Clarence Skinner presents to follow up on chronic medical conditions. Patient overall doing well, having some allergy symptoms. Is taking Claritin daily but feels like it is not working as well as it used to.  Hypertension/OSA: -Medications: Nothing currently, had been on Amlodipine-Benazepril 5-20 but discontinued due to low pressures.  Also taking Lasix 20 mg PRN leg swelling (has to take 1-2 a year; hasn't had to take since LOV). -Checking BP at home (average): Yes; 120-130/80 without medication  -Denies any SOB, CP, vision changes, LE edema  -Unable to tolerate CPAP machine, has an appointment soon for this  HLD: -Medications: Crestor 20 mg -Patient is compliant with above medications and reports no side effects.  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 Also seen by ENT on 03/22/23 -Planning for PET scan in November  -Finished treatment last month  -Following with hospice/palliative care  History of Prostate cancer: -Following with Urology, note reviewed from 01/10/23 -First diagnosed in 07/2018 -Underwent brachytherapy with seen  implant 01/2019 -No urinary complaints -Last PSA 0.3 6/24  GERD: -Currently on Protonix 40 mg, no symptoms  Gout: -Currently Allopurinol 300 mg -Usually flares in the big toe -No flares since being on Alloprinil   Pre-Diabetes: -A1c 1/24 5.7% -Not currently on medications  Health Maintenance: -Blood work UTD -Lung cancer screening due. Referral placed, patient will call them back -Colon cancer screening due, will discuss at follow up    Outpatient Encounter Medications as of 03/29/2023  Medication Sig   albuterol (PROVENTIL HFA;VENTOLIN HFA) 108 (90 Base) MCG/ACT inhaler Inhale into the lungs every 6 (six) hours as needed for wheezing or shortness of breath.   alclomethasone (ACLOVATE) 0.05 % cream Apply 1 Application topically 2 (two) times daily.   allopurinol (ZYLOPRIM) 300 MG tablet Take 1 tablet (300 mg total) by mouth daily.   Fluticasone-Umeclidin-Vilant (TRELEGY ELLIPTA) 100-62.5-25 MCG/ACT AEPB Inhale 1 puff into the lungs daily.   furosemide (LASIX) 20 MG tablet Take 20 mg by mouth as needed.   lidocaine-prilocaine (EMLA) cream Apply 1 Application topically as directed. Apply small amount of cream to port a cath site 1-2 hours prior to port being accessed.   loratadine (CLARITIN) 10 MG tablet TAKE 1 TABLET BY MOUTH EVERY DAY   magic mouthwash SOLN Take 5 mLs by mouth 4 (four) times daily as needed.   nicotine (NICODERM CQ - DOSED IN MG/24 HOURS) 21 mg/24hr patch Place 21 mg onto the skin daily.   pantoprazole (PROTONIX) 40 MG tablet TAKE 1 TABLET BY MOUTH EVERY DAY   prochlorperazine (COMPAZINE) 10 MG tablet Take 10 mg by mouth every 6 (six) hours  as needed.   rosuvastatin (CRESTOR) 20 MG tablet TAKE 1 TABLET BY MOUTH EVERY DAY   silver sulfADIAZINE (SILVADENE) 1 % cream Apply 1 Application topically daily.   tadalafil (CIALIS) 20 MG tablet Take 1 tablet (20 mg total) by mouth daily.   Vitamin D, Ergocalciferol, (DRISDOL) 1.25 MG (50000 UNIT) CAPS capsule TAKE ONE CAPSULE  BY MOUTH BY ONCE WEEKLY   Facility-Administered Encounter Medications as of 03/29/2023  Medication   sodium chloride flush (NS) 0.9 % injection 10 mL    Past Medical History:  Diagnosis Date   COPD (chronic obstructive pulmonary disease) (HCC)    ED (erectile dysfunction)    GERD (gastroesophageal reflux disease)    Gout    Hypertension    Multilevel degenerative disc disease    Pneumonia 11/29/2017   Prostate cancer (HCC)    Sleep apnea    CPAP   Tachycardia    Wears dentures    partial upper and lower    Past Surgical History:  Procedure Laterality Date   ABCESS DRAINAGE  2009   tonsil   COLONOSCOPY WITH PROPOFOL N/A 02/11/2018   Procedure: COLONOSCOPY WITH PROPOFOL;  Surgeon: Toledo, Boykin Nearing, MD;  Location: [facility];  Service: Endoscopy;  Laterality: N/A;   ESOPHAGOGASTRODUODENOSCOPY (EGD) WITH PROPOFOL N/A 02/11/2018   Procedure: ESOPHAGOGASTRODUODENOSCOPY (EGD) WITH PROPOFOL;  Surgeon: Toledo, Boykin Nearing, MD;  Location: [facility];  Service: Endoscopy;  Laterality: N/A;   IR IMAGING GUIDED PORT INSERTION  04/09/2022   MASS EXCISION Right 08/14/2017   Procedure: EXCISION RIGHT TEMPORAL MASS SUBMENTAL MASS;  Surgeon: Bud Face, MD;  Location: [facility];  Service: ENT;  Laterality: Right;  sleep apnea   RADIOACTIVE SEED IMPLANT N/A 01/26/2019   Procedure: RADIOACTIVE SEED IMPLANT/BRACHYTHERAPY IMPLANT;  Surgeon: [name], MD;  Location: [facility];  Service: Urology;  Laterality: N/A;   SEPTOPLASTY  2016   [place]   VOLUME STUDY N/A 12/29/2018   Procedure: VOLUME STUDY;  Surgeon: [name], MD;  Location: [facility];  Service: Urology;  Laterality: N/A;    Family History  Problem Relation Age of Onset   Anxiety disorder Mother    Cancer Father    Hypertension Sister    Diabetes Sister    Asthma Sister    Other Brother        mva  at age 57   Gout Brother    Hypertension Brother    Prostate cancer Maternal Uncle    Prostate  cancer Maternal Uncle     Social History   Socioeconomic History   Marital status: Significant Other    Spouse name: Not on file   Number of children: Not on file   Years of education: Not on file   Highest education level: GED or equivalent  Occupational History   Not on file  Tobacco Use   Smoking status: Every Day    Current packs/day: 1.00    Average packs/day: 1 pack/day for 40.0 years (40.0 ttl pk-yrs)    Types: Cigarettes    Passive exposure: Current   Smokeless tobacco: Former    Types: Snuff, Chew    Quit date: 12/05/1984   Tobacco comments:    since age 49  Vaping Use   Vaping status: Never Used  Substance and Sexual Activity   Alcohol use: Not Currently    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   Drug use: Not Currently   Sexual activity: Yes  Other Topics Concern  Not on file  Social History Narrative   Not on file   Social Determinants of Health   Financial Resource Strain: Low Risk  (12/24/2022)   Overall Financial Resource Strain (CARDIA)    Difficulty of Paying Living Expenses: Not very hard  Food Insecurity: Food Insecurity Present (12/24/2022)   Hunger Vital Sign    Worried About Running Out of Food in the Last Year: Sometimes true    Ran Out of Food in the Last Year: Sometimes true  Transportation Needs: No Transportation Needs (12/24/2022)   PRAPARE - Administrator, Civil Service (Medical): No    Lack of Transportation (Non-Medical): No  Physical Activity: Inactive (12/24/2022)   Exercise Vital Sign    Days of Exercise per Week: 0 days    Minutes of Exercise per Session: 0 min  Stress: No Stress Concern Present (12/24/2022)   [employer] of Occupational Health - Occupational Stress Questionnaire    Feeling of Stress : Not at all  Social Connections: Moderately Isolated (12/24/2022)   Social Connection and Isolation Panel [NHANES]    Frequency of Communication with Friends and Family: Three times a week    Frequency of  Social Gatherings with Friends and Family: More than three times a week    Attends Religious Services: 1 to 4 times per year    Active Member of [employer] or Organizations: No    Attends Banker Meetings: Not on file    Marital Status: Divorced  Intimate Partner Violence: Not At Risk (04/03/2022)   Humiliation, Afraid, Rape, and Kick questionnaire    Fear of Current or Ex-Partner: No    Emotionally Abused: No    Physically Abused: No    Sexually Abused: No    Review of Systems  Constitutional:  Negative for chills and fever.  Respiratory:  Negative for cough, shortness of breath and wheezing.   Cardiovascular:  Negative for chest pain.  Gastrointestinal:  Negative for abdominal pain.  Neurological:  Negative for headaches.      Objective    BP 122/80   Pulse (!) 113   Temp 98 F (36.7 C)   Wt 165 lb 1.6 oz (74.9 kg)   SpO2 94%   BMI 23.03 kg/m   Physical Exam Constitutional:      Appearance: Normal appearance.  HENT:     Head: Normocephalic and atraumatic.  Eyes:     Conjunctiva/sclera: Conjunctivae normal.  Cardiovascular:     Rate and Rhythm: Normal rate and regular rhythm.  Pulmonary:     Effort: Pulmonary effort is normal.     Breath sounds: Normal breath sounds.  Skin:    General: Skin is warm and dry.  Neurological:     General: No focal deficit present.     Mental Status: He is alert. Mental status is at baseline.  Psychiatric:        Mood and Affect: Mood normal.        Behavior: Behavior normal.     Last CBC Lab Results  Component Value Date   WBC 6.2 10/15/2022   HGB 12.8 (L) 10/15/2022   HCT 39.7 10/15/2022   MCV 79.6 (L) 10/15/2022   MCH 25.7 (L) 10/15/2022   RDW 15.6 (H) 10/15/2022   PLT 309 10/15/2022   Last metabolic panel Lab Results  Component Value Date   GLUCOSE 117 (H) 10/15/2022   NA 132 (L) 10/15/2022   K 3.8 10/15/2022   CL 100 10/15/2022   CO2 24  10/15/2022   BUN 14 10/15/2022   CREATININE 0.78 10/15/2022    GFRNONAA >60 10/15/2022   CALCIUM 9.0 10/15/2022   PROT 7.7 10/15/2022   ALBUMIN 4.4 10/15/2022   BILITOT 0.3 10/15/2022   ALKPHOS 70 10/15/2022   AST 18 10/15/2022   ALT 31 10/15/2022   ANIONGAP 8 10/15/2022   Last lipids Lab Results  Component Value Date   CHOL 152 08/01/2022   HDL 54 08/01/2022   LDLCALC 82 08/01/2022   TRIG 81 08/01/2022   CHOLHDL 2.8 08/01/2022   Last hemoglobin A1c Lab Results  Component Value Date   HGBA1C 5.7 (H) 08/01/2022   Last thyroid functions Lab Results  Component Value Date   TSH 0.978 07/05/2022   Last vitamin D No results found for: "25OHVITD2", "25OHVITD3", "VD25OH" Last vitamin B12 and Folate No results found for: "VITAMINB12", "FOLATE"      Assessment & Plan:   1. Chronic obstructive pulmonary disease, unspecified COPD type (HCC): Stable, refill Trelegy.   - Fluticasone-Umeclidin-Vilant (TRELEGY ELLIPTA) 100-62.5-25 MCG/ACT AEPB; Inhale 1 puff into the lungs daily.  Dispense: 28 each; Refill: 3  2. Environmental allergies: Discontinue Claritin, try Xyzal for symptoms.  - levocetirizine (XYZAL ALLERGY 24HR) 5 MG tablet; Take 1 tablet (5 mg total) by mouth every evening.  Dispense: 90 tablet; Refill: 1  3. Prediabetes: A1c increased slightly to 5.9%, patient drinking sweet tea frequently, will decrease and recheck at follow up.  - POCT HgB A1C  4. Essential hypertension: Blood pressure stable without medications, continue to monitor.  5. Mixed hyperlipidemia: Stable, on statin.  6. 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HCC): Following with Oncology and ENT, planning on repeating PET scan in November, will follow.  7. Need for influenza vaccination/Need for pneumococcal 20-valent conjugate vaccination: Flu and Prevnar 20 vaccines administered today.   - Flu vaccine trivalent PF, 6mos and older(Flulaval,Afluria,Fluarix,Fluzone) - Pneumococcal conjugate vaccine 20-valent (Prevnar 20)   Return in about 3 months (around  06/28/2023).   [name], DO

## 2023-03-29 ENCOUNTER — Ambulatory Visit (INDEPENDENT_AMBULATORY_CARE_PROVIDER_SITE_OTHER): Payer: Medicaid Other | Admitting: Internal Medicine

## 2023-03-29 ENCOUNTER — Encounter: Payer: Self-pay | Admitting: Internal Medicine

## 2023-03-29 VITALS — BP 122/80 | HR 113 | Temp 98.0°F | Wt 165.1 lb

## 2023-03-29 DIAGNOSIS — I10 Essential (primary) hypertension: Secondary | ICD-10-CM | POA: Diagnosis not present

## 2023-03-29 DIAGNOSIS — C099 Malignant neoplasm of tonsil, unspecified: Secondary | ICD-10-CM | POA: Diagnosis not present

## 2023-03-29 DIAGNOSIS — E782 Mixed hyperlipidemia: Secondary | ICD-10-CM

## 2023-03-29 DIAGNOSIS — J449 Chronic obstructive pulmonary disease, unspecified: Secondary | ICD-10-CM | POA: Diagnosis not present

## 2023-03-29 DIAGNOSIS — Z9109 Other allergy status, other than to drugs and biological substances: Secondary | ICD-10-CM | POA: Diagnosis not present

## 2023-03-29 DIAGNOSIS — Z23 Encounter for immunization: Secondary | ICD-10-CM

## 2023-03-29 DIAGNOSIS — R7303 Prediabetes: Secondary | ICD-10-CM | POA: Diagnosis not present

## 2023-03-29 LAB — POCT GLYCOSYLATED HEMOGLOBIN (HGB A1C): Hemoglobin A1C: 5.9 % — AB (ref 4.0–5.6)

## 2023-03-29 MED ORDER — LEVOCETIRIZINE DIHYDROCHLORIDE 5 MG PO TABS
5.0000 mg | ORAL_TABLET | Freq: Every evening | ORAL | 1 refills | Status: DC
Start: 2023-03-29 — End: 2023-06-27

## 2023-03-29 MED ORDER — FLUTICASONE-UMECLIDIN-VILANT 100-62.5-25 MCG/ACT IN AEPB: 1.0000 | INHALATION_SPRAY | Freq: Every day | RESPIRATORY_TRACT | 3 refills | Status: DC

## 2023-04-09 ENCOUNTER — Telehealth: Payer: Self-pay | Admitting: *Deleted

## 2023-04-09 NOTE — Patient Outreach (Signed)
Care Management   Note  04/09/2023 Name: Clarence Skinner MRN: [id number] DOB: [birth year]  Sultan Kettner is enrolled in a Managed Medicaid plan: Yes. Outreach attempt today was successful.   The patient was given information about care management services as a benefit of their Medicaid health plan today.    Patient                                              agreed to services and verbal consent obtained.    An initial telephone outreach has been scheduled for: 04/12/23 at 1pm  Estanislado Emms RN, BSN [place]  [facility] RN Care Coordinator [phone]

## 2023-04-12 ENCOUNTER — Other Ambulatory Visit: Payer: Medicaid Other | Admitting: *Deleted

## 2023-04-12 NOTE — Patient Outreach (Addendum)
Medicaid Managed Care   Nurse Care Manager Note  04/12/2023 Name:  Clarence Skinner MRN:  [id number] DOB:  05-11-1961  Clarence Skinner is an 62 y.o. year old male who is a primary patient of Clarence Mail, DO.  The [facility] Managed Care Coordination team was consulted for assistance with:    Head and Neck Cancer  Mr. Clarence Skinner was given information about Medicaid Managed Care Coordination team services today. Clarence Skinner Patient agreed to services and verbal consent obtained.  Engaged with patient by telephone for initial visit in response to provider referral for case management and/or care coordination services.   Assessments/Interventions:  Review of past medical history, allergies, medications, health status, including review of consultants reports, laboratory and other test data, was performed as part of comprehensive evaluation and provision of chronic care management services.  SDOH (Social Determinants of Health) assessments and interventions performed: SDOH Interventions    Flowsheet Row Patient Outreach Telephone from 04/12/2023 in Clarence Skinner POPULATION HEALTH DEPARTMENT Clinical Support from 04/03/2022 in [facility]  SDOH Interventions    Food Insecurity Interventions Patient Declined [id number] Referral, Other (Comment)  [Clarence Skinner's Market card and [facility] food pantry assistance]  Housing Interventions Intervention Not Indicated [id number] Referral, Other (Comment)  [[name] currently lives with SO, and is concerned with financial expenses due to treatment]  Transportation Interventions Intervention Not Indicated [email or web address] Referral, Patient Resources (Friends/Family), CCAR Merchant navy officer ([facility] Only)  Utilities Interventions Intervention Not Indicated [id number] Referral  Alcohol Usage Interventions -- Intervention Not Indicated (Score <7)  Financial Strain Interventions -- [id number] Referral, Intervention Not Indicated, Other (Comment)  [fund  manager for financial assistance]  Physical Activity Interventions -- Intervention Not Indicated  Stress Interventions -- Provide Counseling  Social Connections Interventions -- Intervention Not Indicated       Care Plan  Allergies  Allergen Reactions   Chocolate Itching, Nausea Only and Shortness Of Breath    (large amounts) Other reaction(s): Nausea, Vomiting, Shortness of Breath / Dyspnea   Egg-Derived Products Itching, Nausea And Vomiting, Nausea Only and Shortness Of Breath   Milk (Cow) Shortness Of Breath and Nausea And Vomiting   Benadryl [Diphenhydramine] Other (See Comments)    Altered mental status "makes me crazy and figety"   Milk-Related Compounds Nausea And Vomiting    Medications Reviewed Today     Reviewed by Heidi Dach, RN (Registered Nurse) on 04/12/23 at 1423  Med List Status: <None>   Medication Order Taking? Sig Documenting Provider Last Dose Status Informant  albuterol (PROVENTIL HFA;VENTOLIN HFA) 108 (90 Base) MCG/ACT inhaler [id number] Yes Inhale into the lungs every 6 (six) hours as needed for wheezing or shortness of breath. [provider] Taking Active Self, Pharmacy Records  alclomethasone (ACLOVATE) 0.05 % cream [id number] No Apply 1 Application topically 2 (two) times daily.  Patient not taking: Reported on 04/12/2023   [provider] Not Taking Active   allopurinol (ZYLOPRIM) 300 MG tablet [id number] Yes Take 1 tablet (300 mg total) by mouth daily. Clarence Mail, DO Taking Active   amoxicillin-clavulanate (AUGMENTIN) 875-125 MG tablet [id number] Yes Take 1 tablet by mouth 2 (two) times daily. [provider] Taking Active   Fluticasone-Umeclidin-Vilant (TRELEGY ELLIPTA) 100-62.5-25 MCG/ACT AEPB [id number] Yes Inhale 1 puff into the lungs daily. Clarence Mail, DO Taking Active   furosemide (LASIX) 20 MG tablet [id number] No Take 20 mg by mouth as needed.  Patient not taking: Reported on 04/12/2023   [provider] Not Taking Active Self,  Pharmacy Records  levocetirizine (XYZAL ALLERGY 24HR) 5 MG tablet [id number] Yes Take 1 tablet (5 mg total) by mouth every evening. Clarence Mail, DO Taking Active   lidocaine-prilocaine (EMLA) cream [id number] Yes Apply 1 Application topically as directed. Apply small amount of cream to port a cath site 1-2 hours prior to port being accessed. [name], MD Taking Active   magic mouthwash SOLN [id number] No Take 5 mLs by mouth 4 (four) times daily as needed.  Patient not taking: Reported on 04/12/2023    Not Taking Active   methylPREDNISolone (MEDROL DOSEPAK) 4 MG TBPK tablet [id number] No See admin instructions.  Patient not taking: Reported on 04/12/2023   [provider] Not Taking Active   nicotine (NICODERM CQ - DOSED IN MG/24 HOURS) 21 mg/24hr patch [id number] No Place 21 mg onto the skin daily.  Patient not taking: Reported on 04/12/2023   [provider] Not Taking Active   pantoprazole (PROTONIX) 40 MG tablet [id number] Yes TAKE 1 TABLET BY MOUTH EVERY DAY Clarence Mail, DO Taking Active   prochlorperazine (COMPAZINE) 10 MG tablet [id number] No Take 10 mg by mouth every 6 (six) hours as needed.  Patient not taking: Reported on 04/12/2023   [provider] Not Taking Active   rosuvastatin (CRESTOR) 20 MG tablet [id number] Yes TAKE 1 TABLET BY MOUTH EVERY DAY Clarence Mail, DO Taking Active   silver sulfADIAZINE (SILVADENE) 1 % cream [id number] No Apply 1 Application topically daily.  Patient not taking: Reported on 04/12/2023   [provider] Not Taking Active   tadalafil (CIALIS) 20 MG tablet [id number] Yes Take 1 tablet (20 mg total) by mouth daily. [name], MD Taking Active   Vitamin D, Ergocalciferol, (DRISDOL) 1.25 MG (50000 UNIT) CAPS capsule [id number] Yes TAKE ONE CAPSULE BY MOUTH BY ONCE WEEKLY Margaretann Loveless, MD Taking Active             Patient Active Problem List   Diagnosis Date Noted    Feels cold 07/05/2022   Sinusitis, acute 05/30/2022   Mass of soft tissue 05/16/2022   Blurred vision 05/16/2022   Hypotension 05/09/2022   Thrush 05/09/2022   Mucositis 04/11/2022   Sinusitis, chronic 04/04/2022   Encounter for antineoplastic chemotherapy 03/28/2022   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HCC) 03/22/2022   Goals of care, counseling/discussion 03/22/2022   Carotid sinus syndrome 03/22/2022   Neck pain 03/22/2022   Mass of right side of neck    Normocytic anemia 02/27/2022   Tonsillar mass 02/27/2022   Overweight (BMI 25.0-29.9) 02/27/2022   Syncope 02/26/2022   Pain in joint of left elbow 01/10/2022   Chronic obstructive pulmonary disease (HCC) 06/17/2020   Prostate cancer (HCC) 03/31/2019   Mass of upper lobe of left lung 12/02/2017   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   Tobacco use disorder 11/06/2017   Cervical spondylosis without myelopathy 08/27/2017   Facet arthritis of cervical region 08/27/2017   Obstructive sleep apnea 03/13/2017   Pulmonary hypertension, unspecified (HCC) 01/02/2017   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   Essential hypertension 12/27/2016   Tachycardia 12/27/2016    Conditions to be addressed/monitored per PCP order:   Head and Neck Cancer  Care Plan : RN Care Manager Plan of Care  Updates made by Heidi Dach, RN since 04/12/2023 12:00 AM     Problem: Health Management needs related to Head and Neck Cancer      Long-Range Goal: Development of Plan  of Care to address Health Management needs related to Head and Neck Cancer   Start Date: 04/12/2023  Expected End Date: 07/11/2023  Note:   Current Barriers:  Chronic Disease Management support and education needs related to Head and Neck Cancer  RNCM Clinical Goal(s):  Patient will take all medications exactly as prescribed and will call provider for medication related questions as evidenced by patient reports     attend all scheduled medical appointments: 06/03/23 for PET scan and Oncology follow up as evidenced by provider documentation in EMR        continue to work with RN Care Manager and/or Social Worker to address care management and care coordination needs related to Head and Neck Cancer as evidenced by adherence to CM Team Scheduled appointments       Interventions: Evaluation of current treatment plan related to  self management and patient's adherence to plan as established by provider   Oncology:  (New goal.) Long Term Goal  Assessment of understanding of oncology diagnosis:  Assessed patient understanding of cancer diagnosis and recommended treatment plan Reviewed upcoming provider appointments and treatment appointments Assessed available transportation to appointments and treatments. Has consistent/reliable transportation: Yes Assessed support system. Has consistent/reliable family or other support: Yes Advised patient to take prescribed steroids as directed, ensured patient that it is ok to take while taking antibiotics(prescribed together by provider) Discussed LCSW referral for managing stress related to managing health conditions-patient declined Discussed BSW referral for food resources-patient declined Discussed the importance in eating a healthy diet, MyPlate information provided Explained to patient to listen to his body, rest when his body needs rest Advised patient to follow up with ENT if nasal congestion/runny nose does not improve after completing antibiotics and steroid Discussed smoking cessation-patient declines at this time Encouraged patient to set a quit date for smoking cessation  Patient Goals/Self-Care Activities: Take medications as prescribed   Attend all scheduled provider appointments Call provider office for new concerns or questions        Follow Up:  Patient agrees to Care Plan and Follow-up.  Plan: The Managed Medicaid care management team will  reach out to the patient again over the next 30 days.  Date/time of next scheduled RN care management/care coordination outreach:  05/16/23 @ 10:30am  Estanislado Emms RN, BSN [place]  [facility] RN Care Coordinator [phone]

## 2023-04-12 NOTE — Patient Instructions (Signed)
Visit Information  Mr. Clarence Skinner was given information about Medicaid Managed Care team care coordination services as a part of their [facility] Medicaid benefit. Clarence Skinner verbally consented to engagement with the [facility] Managed Care team.   If you are experiencing a medical emergency, please call 911 or report to your local emergency department or urgent care.   If you have a non-emergency medical problem during routine business hours, please contact your provider's office and ask to speak with a nurse.   For questions related to your [facility] health plan, please call: [phone] or go here:[email or web address]  If you would like to schedule transportation through your [facility] plan, please call the following number at least 2 days in advance of your appointment: [phone].   You can also use the MTM portal or MTM mobile app to manage your rides. Reimbursement for transportation is available through [facility]! For the portal, please go to [email or web address]  Call the [facility] Crisis Line at [phone], at any time, 24 hours a day, 7 days a week. If you are in danger or need immediate medical attention call 911.  If you would like help to quit smoking, call 1-800-QUIT-NOW (531-508-6298) OR Espaol: 1-855-Djelo-Ya (3-500-938-1829) o para ms informacin haga clic aqu or Text READY to 937-169 to register via text  Clarence Skinner,   Please see education materials related to cancer survivorship provided by MyChart link.  Patient verbalizes understanding of instructions and care plan provided today and agrees to view in MyChart. Active MyChart status and patient understanding of how to access instructions and care plan via MyChart confirmed with patient.     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      Long-Range Goal: Development of Plan of Care to address Health Management needs related to Head and Neck Cancer   Start Date: 04/12/2023  Expected End Date: 07/11/2023  Note:   Current Barriers:  Chronic Disease Management support and education needs related to Head and Neck Cancer  RNCM Clinical Goal(s):  Patient will take all medications exactly as prescribed and will call provider for medication related questions as evidenced by patient reports    attend all scheduled medical appointments: 06/03/23 for PET scan and Oncology follow up as evidenced by provider documentation in EMR        continue to work with RN Care Manager and/or Social Worker to address care management and care coordination needs related to Head and Neck Cancer as evidenced by adherence to CM Team Scheduled appointments       Interventions: Evaluation of current treatment plan related to  self management and patient's adherence to plan as established by provider   Oncology:  (New goal.) Long Term Goal  Assessment of understanding of oncology diagnosis:  Assessed patient understanding of cancer diagnosis and recommended treatment plan Reviewed upcoming provider appointments and treatment appointments Assessed available transportation to appointments and treatments. Has consistent/reliable transportation: Yes Assessed support system. Has consistent/reliable family or other support: Yes Advised patient to take prescribed steroids as directed, ensured patient that it is ok to take while  taking antibiotics(prescribed together by provider) Discussed LCSW referral for managing stress related to managing health conditions-patient declined Discussed BSW referral  for food resources-patient declined Discussed the importance in eating a healthy diet, MyPlate information provided Explained to patient to listen to his body, rest when his body needs rest Advised patient to follow up with ENT if nasal congestion/runny nose does not improve after completing antibiotics and steroid Discussed smoking cessation-patient declines at this time Encouraged patient to set a quit date for smoking cessation  Patient Goals/Self-Care Activities: Take medications as prescribed   Attend all scheduled provider appointments Call provider office for new concerns or questions

## 2023-04-23 DIAGNOSIS — G4733 Obstructive sleep apnea (adult) (pediatric): Secondary | ICD-10-CM | POA: Diagnosis not present

## 2023-04-23 DIAGNOSIS — Z419 Encounter for procedure for purposes other than remedying health state, unspecified: Secondary | ICD-10-CM | POA: Diagnosis not present

## 2023-04-28 ENCOUNTER — Other Ambulatory Visit: Payer: Self-pay | Admitting: Internal Medicine

## 2023-04-29 NOTE — Telephone Encounter (Signed)
Requested Prescriptions  Pending Prescriptions Disp Refills   pantoprazole (PROTONIX) 40 MG tablet [Pharmacy Med Name: PANTOPRAZOLE SOD DR 40 MG TAB] 90 tablet 3    Sig: TAKE 1 TABLET BY MOUTH EVERY DAY     Gastroenterology: Proton Pump Inhibitors Passed - 04/28/2023 11:09 AM      Passed - Valid encounter within last 12 months    Recent Outpatient Visits           1 month ago Chronic obstructive pulmonary disease, unspecified COPD type [facility])   [name], DO   4 months ago Essential hypertension   [name], DO   7 months ago Essential hypertension   [facility] [name], DO   8 months ago Essential hypertension   [name], DO   9 months ago Chronic obstructive pulmonary disease, unspecified COPD type [facility])   [place] [facility] [name], DO       Future Appointments             In 1 month [name], DO [place] [facility]   In 8 months Richardo Hanks, Laurette Schimke, MD [facility] [place]

## 2023-05-06 ENCOUNTER — Other Ambulatory Visit: Payer: Self-pay | Admitting: Internal Medicine

## 2023-05-07 ENCOUNTER — Other Ambulatory Visit: Payer: Self-pay | Admitting: Internal Medicine

## 2023-05-07 DIAGNOSIS — J449 Chronic obstructive pulmonary disease, unspecified: Secondary | ICD-10-CM

## 2023-05-07 NOTE — Telephone Encounter (Signed)
Requested Prescriptions  Pending Prescriptions Disp Refills   allopurinol (ZYLOPRIM) 300 MG tablet [Pharmacy Med Name: ALLOPURINOL 300 MG TABLET] 90 tablet 0    Sig: TAKE 1 TABLET BY MOUTH EVERY DAY     Endocrinology:  Gout Agents - allopurinol Failed - 05/06/2023  7:16 PM      Failed - Uric Acid in normal range and within 360 days    No results found for: "POCURA", "LABURIC"       Passed - Cr in normal range and within 360 days    Creatinine  Date Value Ref Range Status  10/03/2012 1.04 0.60 - 1.30 mg/dL Final   Creatinine, Ser  Date Value Ref Range Status  10/15/2022 0.78 0.61 - 1.24 mg/dL Final         Passed - Valid encounter within last 12 months    Recent Outpatient Visits           1 month ago Chronic obstructive pulmonary disease, unspecified COPD type (HCC)   [facility] [name], DO   4 months ago Essential hypertension   [place] [name], DO   7 months ago Essential hypertension   [name], DO   8 months ago Essential hypertension   [place] [facility] [name], DO   9 months ago Chronic obstructive pulmonary disease, unspecified COPD type [facility])   [place] [facility] [name], DO       Future Appointments             In 1 month [name], DO [facility]   In 8 months Richardo Hanks, Laurette Schimke, MD [facility] [place]            Passed - CBC within normal limits and completed in the last 12 months    WBC  Date Value Ref Range Status  10/15/2022 6.2 4.0 - 10.5 K/uL Final   RBC  Date Value Ref Range Status  10/15/2022 4.99 4.22 - 5.81 MIL/uL Final   Hemoglobin  Date Value Ref Range Status  10/15/2022 12.8 (L) 13.0 - 17.0 g/dL Final   HGB  Date Value Ref Range Status  06/17/2013 12.1 (L) 13.0 - 18.0 g/dL Final   HCT  Date  Value Ref Range Status  10/15/2022 39.7 39.0 - 52.0 % Final  06/17/2013 36.8 (L) 40.0 - 52.0 % Final   MCHC  Date Value Ref Range Status  10/15/2022 32.2 30.0 - 36.0 g/dL Final   [facility]  Date Value Ref Range Status  10/15/2022 25.7 (L) 26.0 - 34.0 pg Final   MCV  Date Value Ref Range Status  10/15/2022 79.6 (L) 80.0 - 100.0 fL Final  06/17/2013 80 80 - 100 fL Final   No results found for: "PLTCOUNTKUC", "LABPLAT", "POCPLA" RDW  Date Value Ref Range Status  10/15/2022 15.6 (H) 11.5 - 15.5 % Final  06/17/2013 16.8 (H) 11.5 - 14.5 % Final

## 2023-05-07 NOTE — Telephone Encounter (Signed)
Medication Refill - Medication: Fluticasone-Umeclidin-Vilant (TRELEGY ELLIPTA) 100-62.5-25 MCG/ACT AEPB [132440  (would like a 90 day supply) and   loratadine (CLARITIN) 10 MG tablet  Has the patient contacted their pharmacy? No. ( Preferred Pharmacy (with phone number or street name):  [id number] [place], [place] - [address]  [address], [place] [address]  Phone:  [phone]  Fax:  [phone]  DEA #:  [id number]   Has the patient been seen for an appointment in the last year OR does the patient have an upcoming appointment? Yes.    Agent: Please be advised that RX refills may take up to 3 business days. We ask that you follow-up with your pharmacy.

## 2023-05-07 NOTE — Telephone Encounter (Signed)
Requested medication (s) are due for refill today -no  Requested medication (s) are on the active medication list -yes/no  Future visit scheduled -yes  Last refill: Trelegy Ellipta- request for 90 day supply- unable to change Rx                  Loratadine- no longer on current medication list   Notes to clinic: Patient requesting 90 day supply- off protocol medication  Requested Prescriptions  Pending Prescriptions Disp Refills   Fluticasone-Umeclidin-Vilant (TRELEGY ELLIPTA) 100-62.5-25 MCG/ACT AEPB 28 each 3    Sig: Inhale 1 puff into the lungs daily.     Off-Protocol Failed - 05/07/2023 10:09 AM      Failed - Medication not assigned to a protocol, review manually.      Passed - Valid encounter within last 12 months    Recent Outpatient Visits           1 month ago Chronic obstructive pulmonary disease, unspecified COPD type [facility])   [place] [name], DO   4 months ago Essential hypertension   [place] [facility] [name], DO   7 months ago Essential hypertension   [name], DO   8 months ago Essential hypertension   [name], DO   9 months ago Chronic obstructive pulmonary disease, unspecified COPD type [facility])   [facility] [name], DO       Future Appointments             In 1 month [name], DO [place] [facility]   In 8 months [name], MD [facility]rology [place]             loratadine (CLARITIN) 10 MG tablet 90 tablet 2    Sig: Take 1 tablet (10 mg total) by mouth daily.     Ear, Nose, and Throat:  Antihistamines 2 Passed - 05/07/2023 10:09 AM      Passed - Cr in normal range and within 360 days    Creatinine  Date Value Ref Range Status  10/03/2012 1.04 0.60 - 1.30 mg/dL Final   Creatinine, Ser  Date  Value Ref Range Status  10/15/2022 0.78 0.61 - 1.24 mg/dL Final         Passed - Valid encounter within last 12 months    Recent Outpatient Visits           1 month ago Chronic obstructive pulmonary disease, unspecified COPD type [facility])   [place] [name], DO   4 months ago Essential hypertension   [facility] [name], DO   7 months ago Essential hypertension   [name], DO   8 months ago Essential hypertension   [name], DO   9 months ago Chronic obstructive pulmonary disease, unspecified COPD type [facility])   [place] [facility] [name], DO       Future Appointments             In 1 month [name], DO [place] [facility]   In 8 months Richardo Hanks, Laurette Schimke, MD [facility]               Requested Prescriptions  Pending Prescriptions Disp Refills   Fluticasone-Umeclidin-Vilant (TRELEGY ELLIPTA) 100-62.5-25  MCG/ACT AEPB 28 each 3    Sig: Inhale 1 puff into the lungs daily.     Off-Protocol Failed - 05/07/2023 10:09 AM      Failed - Medication not assigned to a protocol, review manually.      Passed - Valid encounter within last 12 months    Recent Outpatient Visits           1 month ago Chronic obstructive pulmonary disease, unspecified COPD type [facility])   [place] [name], DO   4 months ago Essential hypertension   [place] [facility] [name], DO   7 months ago Essential hypertension   [name], DO   8 months ago Essential hypertension   [name], DO   9 months ago Chronic obstructive pulmonary disease, unspecified COPD type [facility])   [place]  [name], DO       Future Appointments             In 1 month [name], DO [facility]   In 8 months [name], MD [facility] [place]             loratadine (CLARITIN) 10 MG tablet 90 tablet 2    Sig: Take 1 tablet (10 mg total) by mouth daily.     Ear, Nose, and Throat:  Antihistamines 2 Passed - 05/07/2023 10:09 AM      Passed - Cr in normal range and within 360 days    Creatinine  Date Value Ref Range Status  10/03/2012 1.04 0.60 - 1.30 mg/dL Final   Creatinine, Ser  Date Value Ref Range Status  10/15/2022 0.78 0.61 - 1.24 mg/dL Final         Passed - Valid encounter within last 12 months    Recent Outpatient Visits           1 month ago Chronic obstructive pulmonary disease, unspecified COPD type [facility])   [place] [facility] [name], DO   4 months ago Essential hypertension   [facility] [name], DO   7 months ago Essential hypertension   [facility] [name], DO   8 months ago Essential hypertension   [facility] [name], DO   9 months ago Chronic obstructive pulmonary disease, unspecified COPD type [facility])   [place] [facility] [name], DO       Future Appointments             In 1 month [name], DO [place] [facility]   In 8 months Richardo Hanks, Laurette Schimke, MD [facility] [place]

## 2023-05-08 MED ORDER — TRELEGY ELLIPTA 100-62.5-25 MCG/INH IN AEPB
1.0000 | INHALATION_SPRAY | Freq: Every day | RESPIRATORY_TRACT | 3 refills | Status: DC
Start: 2023-05-08 — End: 2023-05-16

## 2023-05-08 MED ORDER — LORATADINE 10 MG PO TABS: 10.0000 mg | ORAL_TABLET | Freq: Every day | ORAL | 0 refills | Status: DC

## 2023-05-09 ENCOUNTER — Telehealth: Payer: Self-pay | Admitting: Internal Medicine

## 2023-05-09 NOTE — Telephone Encounter (Signed)
Patient called requested a 90 supply of Fluticasone-Umeclidin-Vilant (TRELEGY ELLIPTA) 100-62.5-25 MCG/ACT AEPB for insurance purposes. It needs to be sent to  [id number] [place], [place] - [address] Phone: [phone]  Fax: [phone]

## 2023-05-09 NOTE — Telephone Encounter (Signed)
Sent in yesterday.

## 2023-05-10 DIAGNOSIS — C099 Malignant neoplasm of tonsil, unspecified: Secondary | ICD-10-CM | POA: Diagnosis not present

## 2023-05-16 ENCOUNTER — Other Ambulatory Visit: Payer: Self-pay | Admitting: *Deleted

## 2023-05-16 ENCOUNTER — Other Ambulatory Visit: Payer: Self-pay

## 2023-05-16 ENCOUNTER — Other Ambulatory Visit: Payer: Self-pay | Admitting: Internal Medicine

## 2023-05-16 ENCOUNTER — Encounter: Payer: Self-pay | Admitting: Internal Medicine

## 2023-05-16 DIAGNOSIS — J449 Chronic obstructive pulmonary disease, unspecified: Secondary | ICD-10-CM

## 2023-05-16 MED ORDER — TRELEGY ELLIPTA 100-62.5-25 MCG/ACT IN AEPB
1.0000 | INHALATION_SPRAY | Freq: Every day | RESPIRATORY_TRACT | 11 refills | Status: DC
Start: 2023-05-16 — End: 2023-05-17

## 2023-05-16 NOTE — Patient Instructions (Addendum)
Visit Information  Mr. Gazdik was given information about Medicaid Managed Care team care coordination services as a part of their [facility] Medicaid benefit. Tydarius Minas verbally consented to engagement with the [facility] Managed Care team.   If you are experiencing a medical emergency, please call 911 or report to your local emergency department or urgent care.   If you have a non-emergency medical problem during routine business hours, please contact your provider's office and ask to speak with a nurse.   For questions related to your [facility] health plan, please call: [phone] or go here:[email or web address]  If you would like to schedule transportation through your [facility] plan, please call the following number at least 2 days in advance of your appointment: [phone].   You can also use the MTM portal or MTM mobile app to manage your rides. Reimbursement for transportation is available through [facility]! For the portal, please go to [email or web address]  Call the [facility] Crisis Line at [phone], at any time, 24 hours a day, 7 days a week. If you are in danger or need immediate medical attention call 911.  If you would like help to quit smoking, call 1-800-QUIT-NOW (4250854458) OR Espaol: 1-855-Djelo-Ya (4-132-440-1027) o para ms informacin haga clic aqu or Text READY to 253-664 to register via text  Mr. Less[place],   Please see education materials related to smoking cessation and PET scan provided by MyChart link.  Patient verbalizes understanding of instructions and care plan provided today and agrees to view in MyChart. Active MyChart status and patient understanding of how to access instructions and care plan via MyChart confirmed with patient.     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      Long-Range Goal: Development of Plan of Care to address Health Management needs related to Head and Neck Cancer   Start Date: 04/12/2023  Expected End Date: 07/11/2023  Note:   Current Barriers:  Chronic Disease Management support and education needs related to Head and Neck Cancer  RNCM Clinical Goal(s):  Patient will take all medications exactly as prescribed and will call provider for medication related questions as evidenced by patient reports    attend all scheduled medical appointments: 06/03/23 for PET scan, Oncology follow up on 06/26/23 and PCP 06/27/23 as evidenced by provider documentation in EMR        continue to work with RN Care Manager and/or Social Worker to address care management and care coordination needs related to Head and Neck Cancer as evidenced by adherence to CM Team Scheduled appointments       Interventions: Evaluation of current treatment plan related to  self management and patient's adherence to plan as established by provider   Oncology:  (Goal on Track (progressing): YES.) Long Term Goal  Assessment of understanding of oncology diagnosis:  Assessed patient understanding of cancer diagnosis and recommended treatment plan Reviewed upcoming provider appointments and treatment appointments Assessed available transportation to appointments and treatments. Has consistent/reliable transportation: Yes Assessed support system. Has consistent/reliable family or other support: Yes Discussed the importance in eating a healthy  diet, MyPlate information provided Explained to patient to listen to his body, rest when his body needs rest Advised patient to follow up with ENT if nasal  congestion/runny nose does not improve after completing antibiotics and steroid Discussed smoking cessation-patient continues to decline at this time, but would like to quit Encouraged patient to set a quit date for smoking cessation Review of upcoming appointments including: PET scan on 06/03/23, Oncology on 06/26/23 and PCP 06/27/23  Patient Goals/Self-Care Activities: Take medications as prescribed   Attend all scheduled provider appointments Call provider office for new concerns or questions

## 2023-05-16 NOTE — Patient Outreach (Signed)
Medicaid Managed Care   Nurse Care Manager Note  05/16/2023 Name:  Clarence Skinner MRN:  [id number] DOB:  [birth year]  Clarence Skinner is an 62 y.o. year old male who is a primary patient of [name], DO.  [facility] Managed Care Coordination team was consulted for assistance with:    Head and Neck Cancer  Mr. Mallard was given information about Medicaid Managed Care Coordination team services today. Clarence Skinner Patient agreed to services and verbal consent obtained.  Engaged with patient by telephone for follow up visit in response to provider referral for case management and/or care coordination services.   Assessments/Interventions:  Review of past medical history, allergies, medications, health status, including review of consultants reports, laboratory and other test data, was performed as part of comprehensive evaluation and provision of chronic care management services.  SDOH (Social Determinants of Health) assessments and interventions performed: SDOH Interventions    Flowsheet Row Patient Outreach Telephone from 04/12/2023 in [place] [facility] Clinical Support from 04/03/2022 in [facility]  SDOH Interventions    Food Insecurity Interventions Patient Declined [id number] Referral, Other (Comment)  [[name] card and [facility] food pantry assistance]  Housing Interventions Intervention Not Indicated [id number] Referral, Other (Comment)  [Clarence Skinner currently lives with SO, and is concerned with financial expenses due to treatment]  Transportation Interventions Intervention Not Indicated [id number] Referral, Patient Resources (Friends/Family), CCAR Merchant navy officer ([facility] Only)  Utilities Interventions Intervention Not Indicated [id number] Referral  Alcohol Usage Interventions -- Intervention Not Indicated (Score <7)  Financial Strain Interventions -- [id number] Referral, Intervention Not Indicated, Other (Comment)  [fund  manager for financial assistance]  Physical Activity Interventions -- Intervention Not Indicated  Stress Interventions -- Provide Counseling  Social Connections Interventions -- Intervention Not Indicated       Care Plan  Allergies  Allergen Reactions   Chocolate Itching, Nausea Only and Shortness Of Breath    (large amounts) Other reaction(s): Nausea, Vomiting, Shortness of Breath / Dyspnea   Egg-Derived Products Itching, Nausea And Vomiting, Nausea Only and Shortness Of Breath   Milk (Cow) Shortness Of Breath and Nausea And Vomiting   Benadryl [Diphenhydramine] Other (See Comments)    Altered mental status "makes me crazy and figety"   Milk-Related Compounds Nausea And Vomiting    Medications Reviewed Today     Reviewed by Heidi Dach, RN (Registered Nurse) on 05/16/23 at 1014  Med List Status: <None>   Medication Order Taking? Sig Documenting Provider Last Dose Status Informant  albuterol (PROVENTIL HFA;VENTOLIN HFA) 108 (90 Base) MCG/ACT inhaler [id number] Yes Inhale into the lungs every 6 (six) hours as needed for wheezing or shortness of breath. [provider] Taking Active Self, Pharmacy Records  alclomethasone (ACLOVATE) 0.05 % cream [id number]  Apply 1 Application topically 2 (two) times daily.  Patient not taking: Reported on 04/12/2023   [provider]  Active   allopurinol (ZYLOPRIM) 300 MG tablet [id number] Yes TAKE 1 TABLET BY MOUTH EVERY DAY [name], DO Taking Active   amoxicillin-clavulanate (AUGMENTIN) 875-125 MG tablet [id number] No Take 1 tablet by mouth 2 (two) times daily.  Patient not taking: Reported on 05/16/2023   [provider] Not Taking Active            Med Note (Nishita Isaacks A   Thu May 16, 2023 10:07 AM) completed  Fluticasone-Umeclidin-Vilant (TRELEGY ELLIPTA) 100-62.5-25 MCG/ACT AEPB [id number] Yes Inhale 1 puff into the lungs daily. [name], DO Taking Active  furosemide (LASIX) 20 MG tablet  [id number] Yes Take 20 mg by mouth as needed. [provider] Taking Active Self, Pharmacy Records           Med Note ([name] A   Thu May 16, 2023 10:11 AM) Taking as needed  levocetirizine (XYZAL ALLERGY 24HR) 5 MG tablet [id number] Yes Take 1 tablet (5 mg total) by mouth every evening. [name], DO Taking Active   lidocaine-prilocaine (EMLA) cream [id number] No Apply 1 Application topically as directed. Apply small amount of cream to port a cath site 1-2 hours prior to port being accessed.  Patient not taking: Reported on 05/16/2023   Clarence Patience, MD Not Taking Active   loratadine (CLARITIN) 10 MG tablet [id number] Yes Take 1 tablet (10 mg total) by mouth daily. [name], DO Taking Active   magic mouthwash SOLN [id number] No Take 5 mLs by mouth 4 (four) times daily as needed.  Patient not taking: Reported on 04/12/2023    Not Taking Active   methylPREDNISolone (MEDROL DOSEPAK) 4 MG TBPK tablet [id number] No See admin instructions.  Patient not taking: Reported on 04/12/2023   [provider] Not Taking Active            Med Note (Clarence Skinner A   Thu May 16, 2023 10:13 AM) completed  nicotine (NICODERM CQ - DOSED IN MG/24 HOURS) 21 mg/24hr patch [id number] No Place 21 mg onto the skin daily.  Patient not taking: Reported on 04/12/2023   [provider] Not Taking Active   pantoprazole (PROTONIX) 40 MG tablet [id number] Yes TAKE 1 TABLET BY MOUTH EVERY DAY [name], DO Taking Active   prochlorperazine (COMPAZINE) 10 MG tablet [id number] No Take 10 mg by mouth every 6 (six) hours as needed.  Patient not taking: Reported on 04/12/2023   [provider] Not Taking Active   rosuvastatin (CRESTOR) 20 MG tablet [id number] Yes TAKE 1 TABLET BY MOUTH EVERY DAY [name], DO Taking Active   silver sulfADIAZINE (SILVADENE) 1 % cream [id number] No Apply 1 Application topically daily.  Patient not taking: Reported on 04/12/2023    [provider] Not Taking Active   tadalafil (CIALIS) 20 MG tablet [id number] Yes Take 1 tablet (20 mg total) by mouth daily. 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   Goals of care, counseling/discussion 03/22/2022   Carotid sinus syndrome 03/22/2022   Neck pain 03/22/2022   Mass of right side of neck    Normocytic anemia 02/27/2022   Tonsillar mass 02/27/2022   Overweight (BMI 25.0-29.9) 02/27/2022   Syncope 02/26/2022   Pain in joint of left elbow 01/10/2022   Chronic obstructive pulmonary disease (HCC) 06/17/2020   Prostate cancer (HCC) 03/31/2019   Mass of upper lobe of left lung 12/02/2017   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   Tobacco use disorder 11/06/2017   Cervical spondylosis without myelopathy 08/27/2017  Facet arthritis of cervical region 08/27/2017   Obstructive sleep apnea 03/13/2017   Pulmonary hypertension, unspecified (HCC) 01/02/2017   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   Essential hypertension 12/27/2016   Tachycardia 12/27/2016    Conditions to be addressed/monitored per PCP order:   Head and Neck Cancer  Care Plan : RN Care Manager Plan of Care  Updates made by Heidi Dach, RN since 05/16/2023 12:00 AM     Problem: Health Management needs related to Head and Neck Cancer      Long-Range Goal: Development of Plan of Care to address  Health Management needs related to Head and Neck Cancer   Start Date: 04/12/2023  Expected End Date: 07/11/2023  Note:   Current Barriers:  Chronic Disease Management support and education needs related to Head and Neck Cancer  RNCM Clinical Goal(s):  Patient will take all medications exactly as prescribed and will call provider for medication related questions as evidenced by patient reports    attend all scheduled medical appointments: 06/03/23 for PET scan, Oncology follow up on 06/26/23 and PCP 06/27/23 as evidenced by provider documentation in EMR        continue to work with RN Care Manager and/or Social Worker to address care management and care coordination needs related to Head and Neck Cancer as evidenced by adherence to CM Team Scheduled appointments       Interventions: Evaluation of current treatment plan related to  self management and patient's adherence to plan as established by provider   Oncology:  (Goal on Track (progressing): YES.) Long Term Goal  Assessment of understanding of oncology diagnosis:  Assessed patient understanding of cancer diagnosis and recommended treatment plan Reviewed upcoming provider appointments and treatment appointments Assessed available transportation to appointments and treatments. Has consistent/reliable transportation: Yes Assessed support system. Has consistent/reliable family or other support: Yes Discussed the importance in eating a healthy diet, MyPlate information provided Explained to patient to listen to his body, rest when his body needs rest Advised patient to follow up with ENT if nasal congestion/runny nose does not improve after completing antibiotics and steroid Discussed smoking cessation-patient continues to decline at this time, but would like to quit Encouraged patient to set a quit date for smoking cessation Review of upcoming appointments including: PET scan on 06/03/23, Oncology on 06/26/23 and PCP 06/27/23  Patient  Goals/Self-Care Activities: Take medications as prescribed   Attend all scheduled provider appointments Call provider office for new concerns or questions        Follow Up:  Patient agrees to Care Plan and Follow-up.  Plan: The Managed Medicaid care management team will reach out to the patient again over the next 30 days.  Date/time of next scheduled RN care management/care coordination outreach:  06/19/23 at 10:30am  Estanislado Emms RN, BSN [place]  [facility]Based [facility] RN Care Coordinator [phone]

## 2023-05-17 ENCOUNTER — Other Ambulatory Visit: Payer: Self-pay | Admitting: Internal Medicine

## 2023-05-17 DIAGNOSIS — J449 Chronic obstructive pulmonary disease, unspecified: Secondary | ICD-10-CM

## 2023-05-17 MED ORDER — TRELEGY ELLIPTA 100-62.5-25 MCG/ACT IN AEPB: 1.0000 | INHALATION_SPRAY | Freq: Every day | RESPIRATORY_TRACT | 1 refills | Status: AC

## 2023-05-24 DIAGNOSIS — G4733 Obstructive sleep apnea (adult) (pediatric): Secondary | ICD-10-CM | POA: Diagnosis not present

## 2023-05-24 DIAGNOSIS — Z419 Encounter for procedure for purposes other than remedying health state, unspecified: Secondary | ICD-10-CM | POA: Diagnosis not present

## 2023-06-03 DIAGNOSIS — C099 Malignant neoplasm of tonsil, unspecified: Secondary | ICD-10-CM | POA: Diagnosis not present

## 2023-06-03 DIAGNOSIS — Z923 Personal history of irradiation: Secondary | ICD-10-CM | POA: Diagnosis not present

## 2023-06-03 DIAGNOSIS — Z7689 Persons encountering health services in other specified circumstances: Secondary | ICD-10-CM | POA: Diagnosis not present

## 2023-06-03 DIAGNOSIS — C091 Malignant neoplasm of tonsillar pillar (anterior) (posterior): Secondary | ICD-10-CM | POA: Diagnosis not present

## 2023-06-03 DIAGNOSIS — C4492 Squamous cell carcinoma of skin, unspecified: Secondary | ICD-10-CM | POA: Diagnosis not present

## 2023-06-06 ENCOUNTER — Encounter: Payer: Self-pay | Admitting: Internal Medicine

## 2023-06-06 DIAGNOSIS — G4733 Obstructive sleep apnea (adult) (pediatric): Secondary | ICD-10-CM | POA: Diagnosis not present

## 2023-06-10 DIAGNOSIS — C099 Malignant neoplasm of tonsil, unspecified: Secondary | ICD-10-CM | POA: Diagnosis not present

## 2023-06-18 ENCOUNTER — Other Ambulatory Visit: Payer: Self-pay | Admitting: Internal Medicine

## 2023-06-19 ENCOUNTER — Other Ambulatory Visit: Payer: Self-pay | Admitting: *Deleted

## 2023-06-19 NOTE — Telephone Encounter (Signed)
Requested by interface surescripts.  Requested Prescriptions  Pending Prescriptions Disp Refills   rosuvastatin (CRESTOR) 20 MG tablet [Pharmacy Med Name: ROSUVASTATIN CALCIUM 20 MG TAB] 90 tablet 1    Sig: TAKE 1 TABLET BY MOUTH EVERY DAY     Cardiovascular:  Antilipid - Statins 2 Failed - 06/18/2023  5:22 PM      Failed - Lipid Panel in normal range within the last 12 months    Cholesterol  Date Value Ref Range Status  08/01/2022 152 <200 mg/dL Final   LDL Cholesterol (Calc)  Date Value Ref Range Status  08/01/2022 82 mg/dL (calc) Final    Comment:    Reference range: <100 . Desirable range <100 mg/dL for primary prevention;   <70 mg/dL for patients with CHD or diabetic patients  with > or = 2 CHD risk factors. [name] LDL-C is now calculated using the Martin-Hopkins  calculation, which is a validated novel method providing  better accuracy than the Friedewald equation in the  estimation of LDL-C.  Horald Pollen et al. Lenox Ahr. 6948;546(27): 2061-2068  (http://education.[employer].com/faq/FAQ164)    HDL  Date Value Ref Range Status  08/01/2022 54 > OR = 40 mg/dL Final   Triglycerides  Date Value Ref Range Status  08/01/2022 81 <150 mg/dL Final         Passed - Cr in normal range and within 360 days    Creatinine  Date Value Ref Range Status  10/03/2012 1.04 0.60 - 1.30 mg/dL Final   Creatinine, Ser  Date Value Ref Range Status  10/15/2022 0.78 0.61 - 1.24 mg/dL Final         Passed - Patient is not pregnant      Passed - Valid encounter within last 12 months    Recent Outpatient Visits           2 months ago Chronic obstructive pulmonary disease, unspecified COPD type [facility])   [facility] [name], DO   5 months ago Essential hypertension   [facility] [name], DO   8 months ago Essential hypertension   [facility] [name], DO   9 months ago  Essential hypertension   [facility] [name], DO   10 months ago Chronic obstructive pulmonary disease, unspecified COPD type [facility])   [facility] [name], DO       Future Appointments             In 1 week [name], DO  [facility]   In 6 months Richardo Hanks, Laurette Schimke, MD [facility] [place]

## 2023-06-19 NOTE — Patient Outreach (Signed)
   Care Management/Care Coordination  RN Case Manager Case Closure Note  06/19/2023 Name: Clarence Skinner MRN: [id number] DOB: [birth year]  Imanuel Skinner is a 62 y.o. year old male who is a primary care patient of Clarence Mail, DO. The care management/care coordination team was consulted for assistance with chronic disease management and/or care coordination needs.   Care Plan : RN Care Manager Plan of Care  Updates made by Clarence Dach, RN since 06/19/2023 12:00 AM  Completed 06/19/2023   Problem: Health Management needs related to Head and Neck Cancer Resolved 06/19/2023     Long-Range Goal: Development of Plan of Care to address Health Management needs related to Head and Neck Cancer Completed 06/19/2023  Start Date: 04/12/2023  Expected End Date: 07/11/2023  Note:   Current Barriers:  Chronic Disease Management support and education needs related to Head and Neck Cancer  RNCM Clinical Goal(s):  Patient will take all medications exactly as prescribed and will call provider for medication related questions as evidenced by patient reports    attend all scheduled medical appointments:  Oncology follow up on 06/26/23 and PCP 06/27/23 as evidenced by provider documentation in EMR        continue to work with RN Care Manager and/or Social Worker to address care management and care coordination needs related to Head and Neck Cancer as evidenced by adherence to CM Team Scheduled appointments       Interventions: Evaluation of current treatment plan related to  self management and patient's adherence to plan as established by provider Discussed case closure, advised patient to contact RNCM with new needs/barriers to managing his health   Oncology:  (Goal Met.) Long Term Goal  Assessment of understanding of oncology diagnosis:  Assessed patient understanding of cancer diagnosis and recommended treatment plan Reviewed upcoming provider appointments and treatment appointments Assessed  available transportation to appointments and treatments. Has consistent/reliable transportation: Yes Assessed support system. Has consistent/reliable family or other support: Yes Discussed the importance in eating a healthy diet, MyPlate information provided Explained to patient to listen to his body, rest when his body needs rest Advised patient to follow up with ENT if nasal congestion/runny nose does not improve after completing antibiotics and steroid Discussed smoking cessation-patient continues to decline at this time Encouraged patient to set a quit date for smoking cessation-patient declines Review of upcoming appointments including: Oncology on 06/26/23 and PCP 06/27/23 Reviewed and discussed recent PET scan  Patient Goals/Self-Care Activities: Take medications as prescribed   Attend all scheduled provider appointments Call provider office for new concerns or questions        Plan: The patient has met all care management goals, agreed to case closure, and has been provided with contact information for the care management team. Appropriate care team members and provider have been notified via electronic communication. The care management team is available to at any time in the future should needs arise.   Clarence Emms RN, BSN [place]  Value-Based Care Institute [facility] RN Care Coordinator [phone]

## 2023-06-23 DIAGNOSIS — Z419 Encounter for procedure for purposes other than remedying health state, unspecified: Secondary | ICD-10-CM | POA: Diagnosis not present

## 2023-06-25 NOTE — Progress Notes (Unsigned)
Established Patient Office Visit  Subjective    Patient ID: Clarence Skinner, male    DOB: 1960/11/30  Age: 62 y.o. MRN: [id number]  CC:  No chief complaint on file.   HPI Clarence Skinner presents to follow up on chronic medical conditions. Patient overall doing well, having some allergy symptoms. Is taking Claritin daily but feels like it is not working as well as it used to.  Hypertension/OSA: -Medications: Nothing currently, had been on Amlodipine-Benazepril 5-20 but discontinued due to low pressures.  Also taking Lasix 20 mg PRN leg swelling (has to take 1-2 a year; hasn't had to take since LOV). -Checking BP at home (average): Yes; 120-130/80 without medication  -Denies any SOB, CP, vision changes, LE edema  -Unable to tolerate CPAP machine, has an appointment soon for this  HLD: -Medications: Crestor 20 mg -Patient is compliant with above medications and reports no side effects.  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 Also seen by ENT on 03/22/23 -Planning for PET scan in November  -Finished treatment last month  -Following with hospice/palliative care  History of Prostate cancer: -Following with Urology, note reviewed from 01/10/23 -First diagnosed in 07/2018 -Underwent brachytherapy with seen implant 01/2019 -No urinary  complaints -Last PSA 0.3 6/24  GERD: -Currently on Protonix 40 mg, no symptoms  Gout: -Currently Allopurinol 300 mg -Usually flares in the big toe -No flares since being on Alloprinil   Pre-Diabetes: -A1c 9/24 5.9% -Not currently on medications  Health Maintenance: -Blood work UTD -Lung cancer screening due. Referral placed, patient will call them back -Colon cancer screening due, will discuss at follow up    Outpatient Encounter Medications as of 06/27/2023  Medication Sig   albuterol (PROVENTIL HFA;VENTOLIN HFA) 108 (90 Base) MCG/ACT inhaler Inhale into the lungs every 6 (six) hours as needed for wheezing or shortness of breath.   alclomethasone (ACLOVATE) 0.05 % cream Apply 1 Application topically 2 (two) times daily. (Patient not taking: Reported on 04/12/2023)   allopurinol (ZYLOPRIM) 300 MG tablet TAKE 1 TABLET BY MOUTH EVERY DAY   amoxicillin-clavulanate (AUGMENTIN) 875-125 MG tablet Take 1 tablet by mouth 2 (two) times daily. (Patient not taking: Reported on 05/16/2023)   Fluticasone-Umeclidin-Vilant (TRELEGY ELLIPTA) 100-62.5-25 MCG/ACT AEPB Inhale 1 puff into the lungs daily.   furosemide (LASIX) 20 MG tablet Take 20 mg by mouth as needed.   levocetirizine (XYZAL ALLERGY 24HR) 5 MG tablet Take 1 tablet (5 mg total) by mouth every evening.   lidocaine-prilocaine (EMLA) cream Apply 1 Application topically as directed. Apply small amount of cream to port a cath site 1-2 hours prior to port being accessed. (Patient not taking: Reported on 05/16/2023)   loratadine (CLARITIN) 10 MG tablet Take 1 tablet (10 mg total) by mouth daily.   magic mouthwash SOLN Take 5 mLs by mouth 4 (four) times daily as needed. (Patient not  taking: Reported on 04/12/2023)   methylPREDNISolone (MEDROL DOSEPAK) 4 MG TBPK tablet See admin instructions. (Patient not taking: Reported on 04/12/2023)   nicotine (NICODERM CQ - DOSED IN MG/24 HOURS) 21 mg/24hr patch Place 21 mg onto the skin daily. (Patient not  taking: Reported on 04/12/2023)   pantoprazole (PROTONIX) 40 MG tablet TAKE 1 TABLET BY MOUTH EVERY DAY   prochlorperazine (COMPAZINE) 10 MG tablet Take 10 mg by mouth every 6 (six) hours as needed. (Patient not taking: Reported on 04/12/2023)   rosuvastatin (CRESTOR) 20 MG tablet TAKE 1 TABLET BY MOUTH EVERY DAY   silver sulfADIAZINE (SILVADENE) 1 % cream Apply 1 Application topically daily. (Patient not taking: Reported on 04/12/2023)   tadalafil (CIALIS) 20 MG tablet Take 1 tablet (20 mg total) by mouth daily.   Vitamin D, Ergocalciferol, (DRISDOL) 1.25 MG (50000 UNIT) CAPS capsule TAKE ONE CAPSULE BY MOUTH BY ONCE WEEKLY   Facility-Administered Encounter Medications as of 06/27/2023  Medication   sodium chloride flush (NS) 0.9 % injection 10 mL    Past Medical History:  Diagnosis Date   COPD (chronic obstructive pulmonary disease) (HCC)    ED (erectile dysfunction)    GERD (gastroesophageal reflux disease)    Gout    Hypertension    Multilevel degenerative disc disease    Pneumonia 11/29/2017   Prostate cancer (HCC)    Sleep apnea    CPAP   Tachycardia    Wears dentures    partial upper and lower    Past Surgical History:  Procedure Laterality Date   ABCESS DRAINAGE  2009   tonsil   COLONOSCOPY WITH PROPOFOL N/A 02/11/2018   Procedure: COLONOSCOPY WITH PROPOFOL;  Surgeon: Toledo, Boykin Nearing, MD;  Location: [facility];  Service: Endoscopy;  Laterality: N/A;   ESOPHAGOGASTRODUODENOSCOPY (EGD) WITH PROPOFOL N/A 02/11/2018   Procedure: ESOPHAGOGASTRODUODENOSCOPY (EGD) WITH PROPOFOL;  Surgeon: Toledo, Boykin Nearing, MD;  Location: [facility];  Service: Endoscopy;  Laterality: N/A;   IR IMAGING GUIDED PORT INSERTION  04/09/2022   MASS EXCISION Right 08/14/2017   Procedure: EXCISION RIGHT TEMPORAL MASS SUBMENTAL MASS;  Surgeon: Bud Face, MD;  Location: [facility];  Service: ENT;  Laterality: Right;  sleep apnea   RADIOACTIVE SEED IMPLANT N/A 01/26/2019   Procedure:  RADIOACTIVE SEED IMPLANT/BRACHYTHERAPY IMPLANT;  Surgeon: [name], MD;  Location: [facility];  Service: Urology;  Laterality: N/A;   SEPTOPLASTY  2016   [place]   VOLUME STUDY N/A 12/29/2018   Procedure: VOLUME STUDY;  Surgeon: [name], MD;  Location: [facility];  Service: Urology;  Laterality: N/A;    Family History  Problem Relation Age of Onset   Anxiety disorder Mother    Cancer Father    Hypertension Sister    Diabetes Sister    Asthma Sister    Other Brother        mva  at age 75   Gout Brother    Hypertension Brother    Prostate cancer Maternal Uncle    Prostate cancer Maternal Uncle     Social History   Socioeconomic History   Marital status: Significant Other    Spouse name: Not on file   Number of children: Not on file   Years of education: Not on file   Highest education level: GED or equivalent  Occupational History   Not on file  Tobacco Use   Smoking status: Every Day    Current packs/day: 1.00    Average packs/day: 1 pack/day for  40.0 years (40.0 ttl pk-yrs)    Types: Cigarettes    Passive exposure: Current   Smokeless tobacco: Former    Types: Snuff, Chew    Quit date: 12/05/1984   Tobacco comments:    since age 52  Vaping Use   Vaping status: Never Used  Substance and Sexual Activity   Alcohol use: Not Currently    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   Drug use: Not Currently   Sexual activity: Yes  Other Topics Concern   Not on file  Social History Narrative   Not on file   Social Determinants of Health   Financial Resource Strain: Low Risk  (12/24/2022)   Overall Financial Resource Strain (CARDIA)    Difficulty of Paying Living Expenses: Not very hard  Food Insecurity: No Food Insecurity (06/19/2023)   Hunger Vital Sign    Worried About Running Out of Food in the Last Year: Never true    Ran Out of Food in the Last Year: Never true  Recent Concern: Food Insecurity - Food Insecurity Present (04/12/2023)    Hunger Vital Sign    Worried About Running Out of Food in the Last Year: Sometimes true    Ran Out of Food in the Last Year: Sometimes true  Transportation Needs: No Transportation Needs (06/19/2023)   PRAPARE - Administrator, Civil Service (Medical): No    Lack of Transportation (Non-Medical): No  Physical Activity: Unknown (12/24/2022)   Exercise Vital Sign    Days of Exercise per Week: 0 days    Minutes of Exercise per Session: Not on file  Recent Concern: Physical Activity - Inactive (12/24/2022)   Exercise Vital Sign    Days of Exercise per Week: 0 days    Minutes of Exercise per Session: 0 min  Stress: No Stress Concern Present (12/24/2022)   [employer] of Occupational Health - Occupational Stress Questionnaire    Feeling of Stress : Not at all  Social Connections: Moderately Isolated (12/24/2022)   Social Connection and Isolation Panel [NHANES]    Frequency of Communication with Friends and Family: Three times a week    Frequency of Social Gatherings with Friends and Family: More than three times a week    Attends Religious Services: 1 to 4 times per year    Active Member of [employer] or Organizations: No    Attends Banker Meetings: Not on file    Marital Status: Divorced  Intimate Partner Violence: Not At Risk (04/03/2022)   Humiliation, Afraid, Rape, and Kick questionnaire    Fear of Current or Ex-Partner: No    Emotionally Abused: No    Physically Abused: No    Sexually Abused: No    Review of Systems  Constitutional:  Negative for chills and fever.  Respiratory:  Negative for cough, shortness of breath and wheezing.   Cardiovascular:  Negative for chest pain.  Gastrointestinal:  Negative for abdominal pain.  Neurological:  Negative for headaches.      Objective    There were no vitals taken for this visit.  Physical Exam Constitutional:      Appearance: Normal appearance.  HENT:     Head: Normocephalic and atraumatic.  Eyes:      Conjunctiva/sclera: Conjunctivae normal.  Cardiovascular:     Rate and Rhythm: Normal rate and regular rhythm.  Pulmonary:     Effort: Pulmonary effort is normal.     Breath sounds: Normal breath sounds.  Skin:  General: Skin is warm and dry.  Neurological:     General: No focal deficit present.     Mental Status: He is alert. Mental status is at baseline.  Psychiatric:        Mood and Affect: Mood normal.        Behavior: Behavior normal.     Last CBC Lab Results  Component Value Date   WBC 6.2 10/15/2022   HGB 12.8 (L) 10/15/2022   HCT 39.7 10/15/2022   MCV 79.6 (L) 10/15/2022   MCH 25.7 (L) 10/15/2022   RDW 15.6 (H) 10/15/2022   PLT 309 10/15/2022   Last metabolic panel Lab Results  Component Value Date   GLUCOSE 117 (H) 10/15/2022   NA 132 (L) 10/15/2022   K 3.8 10/15/2022   CL 100 10/15/2022   CO2 24 10/15/2022   BUN 14 10/15/2022   CREATININE 0.78 10/15/2022   GFRNONAA >60 10/15/2022   CALCIUM 9.0 10/15/2022   PROT 7.7 10/15/2022   ALBUMIN 4.4 10/15/2022   BILITOT 0.3 10/15/2022   ALKPHOS 70 10/15/2022   AST 18 10/15/2022   ALT 31 10/15/2022   ANIONGAP 8 10/15/2022   Last lipids Lab Results  Component Value Date   CHOL 152 08/01/2022   HDL 54 08/01/2022   LDLCALC 82 08/01/2022   TRIG 81 08/01/2022   CHOLHDL 2.8 08/01/2022   Last hemoglobin A1c Lab Results  Component Value Date   HGBA1C 5.9 (A) 03/29/2023   Last thyroid functions Lab Results  Component Value Date   TSH 0.978 07/05/2022   Last vitamin D No results found for: "25OHVITD2", "25OHVITD3", "VD25OH" Last vitamin B12 and Folate No results found for: "VITAMINB12", "FOLATE"      Assessment & Plan:   1. Chronic obstructive pulmonary disease, unspecified COPD type (HCC): Stable, refill Trelegy.   - Fluticasone-Umeclidin-Vilant (TRELEGY ELLIPTA) 100-62.5-25 MCG/ACT AEPB; Inhale 1 puff into the lungs daily.  Dispense: 28 each; Refill: 3  2. Environmental allergies: Discontinue  Claritin, try Xyzal for symptoms.  - levocetirizine (XYZAL ALLERGY 24HR) 5 MG tablet; Take 1 tablet (5 mg total) by mouth every evening.  Dispense: 90 tablet; Refill: 1  3. Prediabetes: A1c increased slightly to 5.9%, patient drinking sweet tea frequently, will decrease and recheck at follow up.  - POCT HgB A1C  4. Essential hypertension: Blood pressure stable without medications, continue to monitor.  5. Mixed hyperlipidemia: Stable, on statin.  6. 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HCC): Following with Oncology and ENT, planning on repeating PET scan in November, will follow.  7. Need for influenza vaccination/Need for pneumococcal 20-valent conjugate vaccination: Flu and Prevnar 20 vaccines administered today.   - Flu vaccine trivalent PF, 6mos and older(Flulaval,Afluria,Fluarix,Fluzone) - Pneumococcal conjugate vaccine 20-valent (Prevnar 20)   No follow-ups on file.   [name], DO

## 2023-06-26 DIAGNOSIS — C4492 Squamous cell carcinoma of skin, unspecified: Secondary | ICD-10-CM | POA: Diagnosis not present

## 2023-06-26 DIAGNOSIS — F17200 Nicotine dependence, unspecified, uncomplicated: Secondary | ICD-10-CM | POA: Diagnosis not present

## 2023-06-26 DIAGNOSIS — R590 Localized enlarged lymph nodes: Secondary | ICD-10-CM | POA: Diagnosis not present

## 2023-06-26 DIAGNOSIS — B370 Candidal stomatitis: Secondary | ICD-10-CM | POA: Diagnosis not present

## 2023-06-26 DIAGNOSIS — Z923 Personal history of irradiation: Secondary | ICD-10-CM | POA: Diagnosis not present

## 2023-06-26 DIAGNOSIS — C099 Malignant neoplasm of tonsil, unspecified: Secondary | ICD-10-CM | POA: Diagnosis not present

## 2023-06-27 ENCOUNTER — Encounter: Payer: Self-pay | Admitting: Internal Medicine

## 2023-06-27 ENCOUNTER — Ambulatory Visit: Payer: Medicaid Other | Admitting: Internal Medicine

## 2023-06-27 ENCOUNTER — Other Ambulatory Visit: Payer: Self-pay

## 2023-06-27 VITALS — BP 118/76 | HR 98 | Temp 98.4°F | Resp 16 | Ht 71.0 in | Wt 167.3 lb

## 2023-06-27 DIAGNOSIS — C099 Malignant neoplasm of tonsil, unspecified: Secondary | ICD-10-CM | POA: Diagnosis not present

## 2023-06-27 DIAGNOSIS — J449 Chronic obstructive pulmonary disease, unspecified: Secondary | ICD-10-CM

## 2023-06-27 DIAGNOSIS — Z122 Encounter for screening for malignant neoplasm of respiratory organs: Secondary | ICD-10-CM | POA: Diagnosis not present

## 2023-06-27 DIAGNOSIS — Z9109 Other allergy status, other than to drugs and biological substances: Secondary | ICD-10-CM

## 2023-06-27 DIAGNOSIS — R7303 Prediabetes: Secondary | ICD-10-CM | POA: Diagnosis not present

## 2023-06-27 LAB — POCT GLYCOSYLATED HEMOGLOBIN (HGB A1C): Hemoglobin A1C: 5.5 % (ref 4.0–5.6)

## 2023-06-27 MED ORDER — LORATADINE 10 MG PO TABS
10.0000 mg | ORAL_TABLET | Freq: Every day | ORAL | 1 refills | Status: DC
Start: 2023-06-27 — End: 2023-12-26

## 2023-07-09 DIAGNOSIS — G4733 Obstructive sleep apnea (adult) (pediatric): Secondary | ICD-10-CM | POA: Diagnosis not present

## 2023-07-09 DIAGNOSIS — J449 Chronic obstructive pulmonary disease, unspecified: Secondary | ICD-10-CM | POA: Diagnosis not present

## 2023-07-24 DIAGNOSIS — Z419 Encounter for procedure for purposes other than remedying health state, unspecified: Secondary | ICD-10-CM | POA: Diagnosis not present

## 2023-08-01 ENCOUNTER — Ambulatory Visit: Payer: Medicaid Other | Admitting: Internal Medicine

## 2023-08-01 ENCOUNTER — Other Ambulatory Visit: Payer: Self-pay | Admitting: Internal Medicine

## 2023-08-01 ENCOUNTER — Other Ambulatory Visit: Payer: Self-pay

## 2023-08-01 ENCOUNTER — Encounter: Payer: Self-pay | Admitting: Internal Medicine

## 2023-08-01 VITALS — BP 120/74 | HR 98 | Temp 97.9°F | Resp 16 | Ht 71.0 in | Wt 167.0 lb

## 2023-08-01 DIAGNOSIS — M1A9XX0 Chronic gout, unspecified, without tophus (tophi): Secondary | ICD-10-CM | POA: Diagnosis not present

## 2023-08-01 DIAGNOSIS — J069 Acute upper respiratory infection, unspecified: Secondary | ICD-10-CM

## 2023-08-01 DIAGNOSIS — J029 Acute pharyngitis, unspecified: Secondary | ICD-10-CM | POA: Diagnosis not present

## 2023-08-01 LAB — POCT RAPID STREP A (OFFICE): Rapid Strep A Screen: NEGATIVE

## 2023-08-01 MED ORDER — ALLOPURINOL 300 MG PO TABS: 300.0000 mg | ORAL_TABLET | Freq: Every day | ORAL | 1 refills | Status: DC

## 2023-08-01 MED ORDER — METHYLPREDNISOLONE 4 MG PO TBPK: ORAL_TABLET | ORAL | 0 refills | Status: DC

## 2023-08-01 NOTE — Progress Notes (Signed)
 Acute Office Visit  Subjective:     Patient ID: Clarence Skinner, male    DOB: 17-Feb-1961, 63 y.o.   MRN: [id number]  Chief Complaint  Patient presents with   Sinusitis   Throat    Throat burning patient think it maybe related to his Cancer    Sinusitis Associated symptoms include congestion and a sore throat. Pertinent negatives include no chills, coughing, ear pain or shortness of breath.   Patient is in today for sore throat. Symptoms going on for 1 week.   URI Compliant:  -Fever: no -Cough:  chronic cough -Shortness of breath: no -Wheezing: no -Nasal congestion: yes -Runny nose: no -Post nasal drip: yes -Sore throat: yes -Sinus pressure: yes -Headache: yes -Face pain: no -Ear pain: no  -Ear pressure: yes right -Sick contacts: no -Context: fluctuating -Recurrent sinusitis: no -Relief with OTC cold/cough medications: no  -Treatments attempted: Claritin , sinus rinses     Review of Systems  Constitutional:  Negative for chills and fever.  HENT:  Positive for congestion and sore throat. Negative for ear pain and sinus pain.   Respiratory:  Negative for cough, shortness of breath and wheezing.   Cardiovascular:  Negative for chest pain.        Objective:    BP 120/74 (Cuff Size: Large)   Pulse 98   Temp 97.9 F (36.6 C) (Oral)   Resp 16   Ht 5' 11 (1.803 m)   Wt 167 lb (75.8 kg)   SpO2 94%   BMI 23.29 kg/m  BP Readings from Last 3 Encounters:  08/01/23 120/74  06/27/23 118/76  03/29/23 122/80   Wt Readings from Last 3 Encounters:  08/01/23 167 lb (75.8 kg)  06/27/23 167 lb 4.8 oz (75.9 kg)  03/29/23 165 lb 1.6 oz (74.9 kg)      Physical Exam Constitutional:      Appearance: Normal appearance.  HENT:     Head: Normocephalic and atraumatic.     Right Ear: Ear canal and external ear normal.     Left Ear: Ear canal and external ear normal.     Ears:     Comments: Bilateral eustachian tube dysfunction     Nose: Congestion present.      Mouth/Throat:     Mouth: Mucous membranes are moist.     Pharynx: Posterior oropharyngeal erythema present.  Cardiovascular:     Rate and Rhythm: Normal rate and regular rhythm.  Pulmonary:     Effort: Pulmonary effort is normal.     Breath sounds: Normal breath sounds. No wheezing, rhonchi or rales.  Skin:    General: Skin is warm and dry.  Neurological:     General: No focal deficit present.     Mental Status: He is alert. Mental status is at baseline.  Psychiatric:        Mood and Affect: Mood normal.        Behavior: Behavior normal.     No results found for any visits on 08/01/23.      Assessment & Plan:   1. Viral upper respiratory tract infection (Primary)/Sore throat: Rapid strep negative. Treat sinus symptoms with Medrol  dose pack, I do not think based on his physical exam that he needs an antibiotic at this time. Continue nasal sprays and sinus rinse. Follow up if symptoms worsen or fail to improve.  - methylPREDNISolone  (MEDROL  DOSEPAK) 4 MG TBPK tablet; .medr  Dispense: 1 each; Refill: 0 - POCT rapid strep A  2. Chronic gout  involving toe without tophus, unspecified cause, unspecified laterality: Allopurinol  refilled.   - allopurinol  (ZYLOPRIM ) 300 MG tablet; Take 1 tablet (300 mg total) by mouth daily.  Dispense: 90 tablet; Refill: 1   Return if symptoms worsen or fail to improve.  [name], DO

## 2023-08-02 ENCOUNTER — Telehealth: Payer: Self-pay | Admitting: Internal Medicine

## 2023-08-02 NOTE — Telephone Encounter (Signed)
 Patient called sttd provider needs to send verification to the pharmacy before they could fill the script for  methylPREDNISolone (MEDROL DOSEPAK) 4 MG TBPK tablet . Please f/u with pharmacy

## 2023-08-02 NOTE — Telephone Encounter (Signed)
 Pharmacy notified to take as directed

## 2023-08-05 NOTE — Telephone Encounter (Signed)
 Pharmacy comment: Script Clarification:NEED DIRECTION.

## 2023-08-07 DIAGNOSIS — C099 Malignant neoplasm of tonsil, unspecified: Secondary | ICD-10-CM | POA: Diagnosis not present

## 2023-08-07 DIAGNOSIS — Z7689 Persons encountering health services in other specified circumstances: Secondary | ICD-10-CM | POA: Diagnosis not present

## 2023-08-07 DIAGNOSIS — B370 Candidal stomatitis: Secondary | ICD-10-CM | POA: Diagnosis not present

## 2023-08-16 DIAGNOSIS — C099 Malignant neoplasm of tonsil, unspecified: Secondary | ICD-10-CM | POA: Diagnosis not present

## 2023-08-20 DIAGNOSIS — M79641 Pain in right hand: Secondary | ICD-10-CM | POA: Diagnosis not present

## 2023-08-22 DIAGNOSIS — M25531 Pain in right wrist: Secondary | ICD-10-CM | POA: Diagnosis not present

## 2023-08-22 DIAGNOSIS — M1000 Idiopathic gout, unspecified site: Secondary | ICD-10-CM | POA: Diagnosis not present

## 2023-08-24 DIAGNOSIS — Z419 Encounter for procedure for purposes other than remedying health state, unspecified: Secondary | ICD-10-CM | POA: Diagnosis not present

## 2023-09-04 DIAGNOSIS — R0981 Nasal congestion: Secondary | ICD-10-CM | POA: Diagnosis not present

## 2023-09-04 DIAGNOSIS — F17210 Nicotine dependence, cigarettes, uncomplicated: Secondary | ICD-10-CM | POA: Diagnosis not present

## 2023-09-04 DIAGNOSIS — K117 Disturbances of salivary secretion: Secondary | ICD-10-CM | POA: Diagnosis not present

## 2023-09-04 DIAGNOSIS — B370 Candidal stomatitis: Secondary | ICD-10-CM | POA: Diagnosis not present

## 2023-09-04 DIAGNOSIS — R1310 Dysphagia, unspecified: Secondary | ICD-10-CM | POA: Diagnosis not present

## 2023-09-04 DIAGNOSIS — C4492 Squamous cell carcinoma of skin, unspecified: Secondary | ICD-10-CM | POA: Diagnosis not present

## 2023-09-04 DIAGNOSIS — Z7689 Persons encountering health services in other specified circumstances: Secondary | ICD-10-CM | POA: Diagnosis not present

## 2023-09-04 DIAGNOSIS — C099 Malignant neoplasm of tonsil, unspecified: Secondary | ICD-10-CM | POA: Diagnosis not present

## 2023-09-04 DIAGNOSIS — M109 Gout, unspecified: Secondary | ICD-10-CM | POA: Diagnosis not present

## 2023-09-04 DIAGNOSIS — Z7952 Long term (current) use of systemic steroids: Secondary | ICD-10-CM | POA: Diagnosis not present

## 2023-09-06 DIAGNOSIS — G4733 Obstructive sleep apnea (adult) (pediatric): Secondary | ICD-10-CM | POA: Diagnosis not present

## 2023-09-10 DIAGNOSIS — J449 Chronic obstructive pulmonary disease, unspecified: Secondary | ICD-10-CM | POA: Diagnosis not present

## 2023-09-10 DIAGNOSIS — G4733 Obstructive sleep apnea (adult) (pediatric): Secondary | ICD-10-CM | POA: Diagnosis not present

## 2023-09-13 DIAGNOSIS — C099 Malignant neoplasm of tonsil, unspecified: Secondary | ICD-10-CM | POA: Diagnosis not present

## 2023-09-16 ENCOUNTER — Other Ambulatory Visit: Payer: Self-pay | Admitting: Internal Medicine

## 2023-09-16 NOTE — Telephone Encounter (Unsigned)
 Copied from CRM [id number]. Topic: Clinical - Medication Refill >> Sep 16, 2023  1:31 PM Martha Clan wrote: Most Recent Primary Care Visit:  Provider: [name]  Department: [facility]  Visit Type: OFFICE VISIT  Date: 08/01/2023  Medication: albuterol (PROVENTIL HFA;VENTOLIN HFA) 108 (90 Base) MCG/ACT inhaler [[id number]]  Has the patient contacted their pharmacy? Yes (Agent: If no, request that the patient contact the pharmacy for the refill. If patient does not wish to contact the pharmacy document the reason why and proceed with request.) (Agent: If yes, when and what did the pharmacy advise?)  Is this the correct pharmacy for this prescription? Yes If no, delete pharmacy and type the correct one.  This is the patient's preferred pharmacy:  [id number] - [place],  - [address] [place] [address] Phone: [phone] Fax: [phone]    Has the prescription been filled recently? No  Is the patient out of the medication? Yes  Has the patient been seen for an appointment in the last year OR does the patient have an upcoming appointment? Yes  Can we respond through MyChart? Yes  Agent: Please be advised that Rx refills may take up to 3 business days. We ask that you follow-up with your pharmacy.

## 2023-09-21 DIAGNOSIS — Z419 Encounter for procedure for purposes other than remedying health state, unspecified: Secondary | ICD-10-CM | POA: Diagnosis not present

## 2023-09-25 ENCOUNTER — Encounter: Payer: Self-pay | Admitting: Internal Medicine

## 2023-09-25 ENCOUNTER — Other Ambulatory Visit: Payer: Self-pay

## 2023-09-25 ENCOUNTER — Ambulatory Visit: Payer: Medicaid Other | Admitting: Internal Medicine

## 2023-09-25 VITALS — BP 126/72 | HR 98 | Temp 98.1°F | Resp 16 | Ht 71.0 in | Wt 167.0 lb

## 2023-09-25 DIAGNOSIS — J449 Chronic obstructive pulmonary disease, unspecified: Secondary | ICD-10-CM

## 2023-09-25 DIAGNOSIS — J3489 Other specified disorders of nose and nasal sinuses: Secondary | ICD-10-CM | POA: Diagnosis not present

## 2023-09-25 MED ORDER — TRELEGY ELLIPTA 100-62.5-25 MCG/ACT IN AEPB: 1.0000 | INHALATION_SPRAY | Freq: Every day | RESPIRATORY_TRACT | 11 refills | Status: DC

## 2023-09-25 MED ORDER — ALBUTEROL SULFATE HFA 108 (90 BASE) MCG/ACT IN AERS: 1.0000 | INHALATION_SPRAY | Freq: Four times a day (QID) | RESPIRATORY_TRACT | 3 refills | Status: DC | PRN

## 2023-09-25 NOTE — Progress Notes (Signed)
 Established Patient Office Visit  Subjective    Patient ID: Clarence Skinner, male    DOB: Apr 13, 1961  Age: 63 y.o. MRN: [id number]  CC:  Chief Complaint  Patient presents with   Medical Management of Chronic Issues    3 month recheck    HPI Clarence Skinner presents to follow up on chronic medical conditions. Patient still having sinus drainage, taking Mucinex but not resolving issue.  Hypertension/OSA: -Medications: Nothing currently, had been on Amlodipine-Benazepril 5-20 but discontinued due to low pressures.  Also taking Lasix 20 mg PRN leg swelling (has to take 1-2 a year; hasn't had to take since LOV). -Checking BP at home (average): Yes; 120-130/80 without medication  -Denies any SOB, CP, vision changes, LE edema  -Unable to tolerate CPAP machine, has an appointment soon for this  HLD: -Medications: Crestor 20 mg -Patient is compliant with above medications and reports no side effects.  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  Pre-Diabetes: -A1c 12/24 5.5% -Not currently on medications  Health Maintenance: -Blood work UTD -Lung cancer screening due. Referral placed, patient will call them back -Colon cancer screening: colonoscopy in 2019, repeat in 10 years    Outpatient Encounter Medications as of 09/25/2023  Medication Sig   allopurinol (ZYLOPRIM) 300 MG tablet Take 1 tablet (300 mg total) by mouth daily.   Fluticasone-Umeclidin-Vilant (TRELEGY ELLIPTA) 100-62.5-25 MCG/ACT AEPB Inhale 1 puff into the lungs daily.   furosemide (LASIX) 20 MG tablet Take 20 mg by mouth as needed.   loratadine (CLARITIN) 10 MG tablet Take 1 tablet (10 mg total) by mouth daily.   pantoprazole (PROTONIX) 40 MG tablet TAKE 1 TABLET BY MOUTH EVERY DAY   rosuvastatin (CRESTOR) 20 MG tablet TAKE 1 TABLET BY MOUTH EVERY DAY   tadalafil (CIALIS) 20 MG tablet Take 1 tablet (20 mg total) by mouth daily.   [DISCONTINUED] albuterol (PROVENTIL HFA;VENTOLIN HFA) 108 (90 Base) MCG/ACT inhaler Inhale into the lungs every 6 (six) hours as needed for wheezing or shortness of breath.   albuterol (VENTOLIN HFA) 108 (90 Base) MCG/ACT inhaler Inhale 1-2 puffs into the lungs every 6 (six) hours as needed for wheezing or shortness of breath.   colchicine 0.6 MG tablet Take 0.6 mg by mouth daily. (Patient not taking: Reported on 09/25/2023)   Vitamin D, Ergocalciferol, (DRISDOL) 1.25 MG (50000 UNIT) CAPS capsule TAKE ONE CAPSULE BY MOUTH BY ONCE WEEKLY (Patient not taking: Reported on 09/25/2023)   [DISCONTINUED] magic mouthwash SOLN Take 5 mLs by mouth 4 (four) times daily as needed. (Patient not taking: Reported on 09/25/2023)   [  DISCONTINUED] methylPREDNISolone (MEDROL DOSEPAK) 4 MG TBPK tablet .medr (Patient not taking: Reported on 09/25/2023)   Facility-Administered Encounter Medications as of 09/25/2023  Medication   sodium chloride flush (NS) 0.9 % injection 10 mL    Past Medical History:  Diagnosis Date   COPD (chronic obstructive  pulmonary disease) (HCC)    ED (erectile dysfunction)    GERD (gastroesophageal reflux disease)    Gout    Hypertension    Multilevel degenerative disc disease    Pneumonia 11/29/2017   Prostate cancer (HCC)    Sleep apnea    CPAP   Tachycardia    Wears dentures    partial upper and lower    Past Surgical History:  Procedure Laterality Date   ABCESS DRAINAGE  2009   tonsil   COLONOSCOPY WITH PROPOFOL N/A 02/11/2018   Procedure: COLONOSCOPY WITH PROPOFOL;  Surgeon: Toledo, Boykin Nearing, MD;  Location: [facility];  Service: Endoscopy;  Laterality: N/A;   ESOPHAGOGASTRODUODENOSCOPY (EGD) WITH PROPOFOL N/A 02/11/2018   Procedure: ESOPHAGOGASTRODUODENOSCOPY (EGD) WITH PROPOFOL;  Surgeon: Toledo, Boykin Nearing, MD;  Location: [facility];  Service: Endoscopy;  Laterality: N/A;   IR IMAGING GUIDED PORT INSERTION  04/09/2022   MASS EXCISION Right 08/14/2017   Procedure: EXCISION RIGHT TEMPORAL MASS SUBMENTAL MASS;  Surgeon: Bud Face, MD;  Location: [facility];  Service: ENT;  Laterality: Right;  sleep apnea   RADIOACTIVE SEED IMPLANT N/A 01/26/2019   Procedure: RADIOACTIVE SEED IMPLANT/BRACHYTHERAPY IMPLANT;  Surgeon: [name], MD;  Location: [facility];  Service: Urology;  Laterality: N/A;   SEPTOPLASTY  2016   [place]   VOLUME STUDY N/A 12/29/2018   Procedure: VOLUME STUDY;  Surgeon: [name], MD;  Location: [facility];  Service: Urology;  Laterality: N/A;    Family History  Problem Relation Age of Onset   Anxiety disorder Mother    Cancer Father    Hypertension Sister    Diabetes Sister    Asthma Sister    Other Brother        mva  at age 62   Gout Brother    Hypertension Brother    Prostate cancer Maternal Uncle    Prostate cancer Maternal Uncle     Social History   Socioeconomic History   Marital status: Significant Other    Spouse name: Not on file   Number of children: Not on file   Years of education: Not on file   Highest education  level: GED or equivalent  Occupational History   Not on file  Tobacco Use   Smoking status: Every Day    Current packs/day: 1.00    Average packs/day: 1 pack/day for 40.0 years (40.0 ttl pk-yrs)    Types: Cigarettes    Passive exposure: Current   Smokeless tobacco: Former    Types: Snuff, Chew    Quit date: 12/05/1984   Tobacco comments:    since age 71  Vaping Use   Vaping status: Never Used  Substance and Sexual Activity   Alcohol use: Not Currently    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   Drug use: Not Currently   Sexual activity: Yes  Other Topics Concern   Not on file  Social History Narrative   Not on file   Social Drivers of Health   Financial Resource Strain: Low Risk  (07/31/2023)   Overall Financial Resource Strain (CARDIA)    Difficulty of Paying Living Expenses: Not hard at all  Food  Insecurity: No Food Insecurity (07/31/2023)   Hunger Vital Sign    Worried About Running Out of Food in the Last Year: Never true    Ran Out of Food in the Last Year: Never true  Transportation Needs: No Transportation Needs (07/31/2023)   PRAPARE - Administrator, Civil Service (Medical): No    Lack of Transportation (Non-Medical): No  Physical Activity: Unknown (07/31/2023)   Exercise Vital Sign    Days of Exercise per Week: Patient declined    Minutes of Exercise per Session: Not on file  Stress: No Stress Concern Present (07/31/2023)   [employer] of Occupational Health - Occupational Stress Questionnaire    Feeling of Stress : Only a little  Social Connections: Unknown (07/31/2023)   Social Connection and Isolation Panel [NHANES]    Frequency of Communication with Friends and Family: Patient declined    Frequency of Social Gatherings with Friends and Family: Patient declined    Attends Religious Services: Patient declined    Database administrator or Organizations: No    Attends Engineer, structural: Not on file    Marital Status:  Divorced  Recent Concern: Social Connections - Socially Isolated (06/25/2023)   Social Connection and Isolation Panel [NHANES]    Frequency of Communication with Friends and Family: More than three times a week    Frequency of Social Gatherings with Friends and Family: Once a week    Attends Religious Services: Never    Database administrator or Organizations: No    Attends Engineer, structural: Not on file    Marital Status: Divorced  Intimate Partner Violence: Not At Risk (04/03/2022)   Humiliation, Afraid, Rape, and Kick questionnaire    Fear of Current or Ex-Partner: No    Emotionally Abused: No    Physically Abused: No    Sexually Abused: No    Review of Systems  Constitutional:  Negative for chills and fever.  Respiratory:  Negative for cough, shortness of breath and wheezing.   Cardiovascular:  Negative for chest pain.  Gastrointestinal:  Negative for abdominal pain.  Neurological:  Negative for headaches.      Objective    BP 126/72 (Cuff Size: Normal)   Pulse 98   Temp 98.1 F (36.7 C) (Oral)   Resp 16   Ht 5\' 11"  (1.803 m)   Wt 167 lb (75.8 kg)   SpO2 98%   BMI 23.29 kg/m   Physical Exam Constitutional:      Appearance: Normal appearance.  HENT:     Head: Normocephalic and atraumatic.     Mouth/Throat:     Mouth: Mucous membranes are moist.     Pharynx: Oropharynx is clear.  Eyes:     Extraocular Movements: Extraocular movements intact.     Conjunctiva/sclera: Conjunctivae normal.     Pupils: Pupils are equal, round, and reactive to light.  Cardiovascular:     Rate and Rhythm: Normal rate and regular rhythm.  Pulmonary:     Effort: Pulmonary effort is normal.     Breath sounds: Normal breath sounds.  Skin:    General: Skin is warm and dry.  Neurological:     General: No focal deficit present.     Mental Status: He is alert. Mental status is at baseline.  Psychiatric:        Mood and Affect: Mood normal.        Behavior: Behavior normal.      Last CBC Lab  Results  Component Value Date   WBC 6.2 10/15/2022   HGB 12.8 (L) 10/15/2022   HCT 39.7 10/15/2022   MCV 79.6 (L) 10/15/2022   MCH 25.7 (L) 10/15/2022   RDW 15.6 (H) 10/15/2022   PLT 309 10/15/2022   Last metabolic panel Lab Results  Component Value Date   GLUCOSE 117 (H) 10/15/2022   NA 132 (L) 10/15/2022   K 3.8 10/15/2022   CL 100 10/15/2022   CO2 24 10/15/2022   BUN 14 10/15/2022   CREATININE 0.78 10/15/2022   GFRNONAA >60 10/15/2022   CALCIUM 9.0 10/15/2022   PROT 7.7 10/15/2022   ALBUMIN 4.4 10/15/2022   BILITOT 0.3 10/15/2022   ALKPHOS 70 10/15/2022   AST 18 10/15/2022   ALT 31 10/15/2022   ANIONGAP 8 10/15/2022   Last lipids Lab Results  Component Value Date   CHOL 152 08/01/2022   HDL 54 08/01/2022   LDLCALC 82 08/01/2022   TRIG 81 08/01/2022   CHOLHDL 2.8 08/01/2022   Last hemoglobin A1c Lab Results  Component Value Date   HGBA1C 5.5 06/27/2023   Last thyroid functions Lab Results  Component Value Date   TSH 0.978 07/05/2022   Last vitamin D No results found for: "25OHVITD2", "25OHVITD3", "VD25OH" Last vitamin B12 and Folate No results found for: "VITAMINB12", "FOLATE"      Assessment & Plan:   1. Chronic obstructive pulmonary disease, unspecified COPD type (HCC) (Primary): Doing well with inhalers, refilled today.   - albuterol (VENTOLIN HFA) 108 (90 Base) MCG/ACT inhaler; Inhale 1-2 puffs into the lungs every 6 (six) hours as needed for wheezing or shortness of breath.  Dispense: 1 each; Refill: 3 - Fluticasone-Umeclidin-Vilant (TRELEGY ELLIPTA) 100-62.5-25 MCG/ACT AEPB; Inhale 1 puff into the lungs daily.  Dispense: 1 each; Refill: 11  2. Sinus drainage: Patient still having a lot of thick but clear sinus drainage, worse in the mornings.  Recommend Mucinex 1200 mg in the morning, continuing sinus rinses and antihistamines.   Return in about 3 months (around 12/26/2023).   [name], DO

## 2023-10-20 ENCOUNTER — Other Ambulatory Visit: Payer: Self-pay | Admitting: Oncology

## 2023-10-22 ENCOUNTER — Encounter: Payer: Self-pay | Admitting: Oncology

## 2023-10-25 DIAGNOSIS — I272 Pulmonary hypertension, unspecified: Secondary | ICD-10-CM | POA: Diagnosis not present

## 2023-10-25 DIAGNOSIS — Z85828 Personal history of other malignant neoplasm of skin: Secondary | ICD-10-CM | POA: Diagnosis not present

## 2023-10-25 DIAGNOSIS — C760 Malignant neoplasm of head, face and neck: Secondary | ICD-10-CM | POA: Diagnosis not present

## 2023-10-25 DIAGNOSIS — G4733 Obstructive sleep apnea (adult) (pediatric): Secondary | ICD-10-CM | POA: Diagnosis not present

## 2023-10-25 DIAGNOSIS — Z8249 Family history of ischemic heart disease and other diseases of the circulatory system: Secondary | ICD-10-CM | POA: Diagnosis not present

## 2023-10-25 DIAGNOSIS — K219 Gastro-esophageal reflux disease without esophagitis: Secondary | ICD-10-CM | POA: Diagnosis not present

## 2023-10-25 DIAGNOSIS — N529 Male erectile dysfunction, unspecified: Secondary | ICD-10-CM | POA: Diagnosis not present

## 2023-10-25 DIAGNOSIS — M109 Gout, unspecified: Secondary | ICD-10-CM | POA: Diagnosis not present

## 2023-10-25 DIAGNOSIS — J439 Emphysema, unspecified: Secondary | ICD-10-CM | POA: Diagnosis not present

## 2023-10-25 DIAGNOSIS — I10 Essential (primary) hypertension: Secondary | ICD-10-CM | POA: Diagnosis not present

## 2023-10-25 DIAGNOSIS — E785 Hyperlipidemia, unspecified: Secondary | ICD-10-CM | POA: Diagnosis not present

## 2023-10-31 ENCOUNTER — Telehealth: Payer: Self-pay | Admitting: Internal Medicine

## 2023-10-31 NOTE — Telephone Encounter (Signed)
 Copied from CRM [id number]. Topic: Clinical - Prescription Issue >> Oct 31, 2023  3:08 PM Taleah C wrote: Reason for CRM: patient called and explained that he needs the inhaler solution and nose spray azelastine hydrochloride spray 0.1% refilled. He is using the [id number] - [place], [place] - [address], [place] [address].   I did not see these medications on his med list.

## 2023-10-31 NOTE — Telephone Encounter (Signed)
 Trellegy sent already

## 2023-11-01 ENCOUNTER — Other Ambulatory Visit: Payer: Self-pay | Admitting: Internal Medicine

## 2023-11-01 DIAGNOSIS — Z9109 Other allergy status, other than to drugs and biological substances: Secondary | ICD-10-CM

## 2023-11-01 DIAGNOSIS — J449 Chronic obstructive pulmonary disease, unspecified: Secondary | ICD-10-CM

## 2023-11-01 MED ORDER — IPRATROPIUM-ALBUTEROL 0.5-2.5 (3) MG/3ML IN SOLN: 3.0000 mL | RESPIRATORY_TRACT | 1 refills | Status: AC | PRN

## 2023-11-01 MED ORDER — AZELASTINE HCL 0.1 % NA SOLN: 1.0000 | Freq: Two times a day (BID) | NASAL | 5 refills | Status: DC

## 2023-11-02 DIAGNOSIS — Z419 Encounter for procedure for purposes other than remedying health state, unspecified: Secondary | ICD-10-CM | POA: Diagnosis not present

## 2023-11-04 NOTE — Telephone Encounter (Signed)
No answer from pt left detailed vm. °

## 2023-11-08 DIAGNOSIS — C099 Malignant neoplasm of tonsil, unspecified: Secondary | ICD-10-CM | POA: Diagnosis not present

## 2023-11-27 ENCOUNTER — Other Ambulatory Visit: Payer: Self-pay | Admitting: Internal Medicine

## 2023-12-02 DIAGNOSIS — Z419 Encounter for procedure for purposes other than remedying health state, unspecified: Secondary | ICD-10-CM | POA: Diagnosis not present

## 2023-12-04 ENCOUNTER — Encounter: Payer: Self-pay | Admitting: Internal Medicine

## 2023-12-04 DIAGNOSIS — G08 Intracranial and intraspinal phlebitis and thrombophlebitis: Secondary | ICD-10-CM | POA: Diagnosis not present

## 2023-12-04 DIAGNOSIS — C099 Malignant neoplasm of tonsil, unspecified: Secondary | ICD-10-CM | POA: Diagnosis not present

## 2023-12-04 DIAGNOSIS — Z8589 Personal history of malignant neoplasm of other organs and systems: Secondary | ICD-10-CM | POA: Diagnosis not present

## 2023-12-04 DIAGNOSIS — Z7689 Persons encountering health services in other specified circumstances: Secondary | ICD-10-CM | POA: Diagnosis not present

## 2023-12-04 DIAGNOSIS — Z08 Encounter for follow-up examination after completed treatment for malignant neoplasm: Secondary | ICD-10-CM | POA: Diagnosis not present

## 2023-12-04 DIAGNOSIS — Z923 Personal history of irradiation: Secondary | ICD-10-CM | POA: Diagnosis not present

## 2023-12-06 DIAGNOSIS — G4733 Obstructive sleep apnea (adult) (pediatric): Secondary | ICD-10-CM | POA: Diagnosis not present

## 2023-12-14 ENCOUNTER — Other Ambulatory Visit: Payer: Self-pay | Admitting: Internal Medicine

## 2023-12-18 DIAGNOSIS — G08 Intracranial and intraspinal phlebitis and thrombophlebitis: Secondary | ICD-10-CM | POA: Diagnosis not present

## 2023-12-18 DIAGNOSIS — I82621 Acute embolism and thrombosis of deep veins of right upper extremity: Secondary | ICD-10-CM | POA: Diagnosis not present

## 2023-12-18 DIAGNOSIS — Z7689 Persons encountering health services in other specified circumstances: Secondary | ICD-10-CM | POA: Diagnosis not present

## 2023-12-18 NOTE — Telephone Encounter (Signed)
 Requested medication (s) are due for refill today: yes  Requested medication (s) are on the active medication list: yes  Last refill:  06/19/23 #90 1   Future visit scheduled: yes  Notes to clinic:  overdue lab work    Requested Prescriptions  Pending Prescriptions Disp Refills   rosuvastatin  (CRESTOR ) 20 MG tablet [Pharmacy Med Name: ROSUVASTATIN  CALCIUM  20 MG TAB] 90 tablet 1    Sig: TAKE 1 TABLET BY MOUTH EVERY DAY     Cardiovascular:  Antilipid - Statins 2 Failed - 12/18/2023 11:20 AM      Failed - Cr in normal range and within 360 days    Creatinine  Date Value Ref Range Status  10/03/2012 1.04 0.60 - 1.30 mg/dL Final   Creatinine, Ser  Date Value Ref Range Status  10/15/2022 0.78 0.61 - 1.24 mg/dL Final         Failed - Lipid Panel in normal range within the last 12 months    Cholesterol  Date Value Ref Range Status  08/01/2022 152 <200 mg/dL Final   LDL Cholesterol (Calc)  Date Value Ref Range Status  08/01/2022 82 mg/dL (calc) Final    Comment:    Reference range: <100 . Desirable range <100 mg/dL for primary prevention;   <70 mg/dL for patients with CHD or diabetic patients  with > or = 2 CHD risk factors. Aaron Aas LDL-C is now calculated using the Martin-Hopkins  calculation, which is a validated novel method providing  better accuracy than the Friedewald equation in the  estimation of LDL-C.  Melinda Sprawls et al. Erroll Heard. 8469;629(52): 2061-2068  (http://education.[employer].com/faq/FAQ164)    HDL  Date Value Ref Range Status  08/01/2022 54 > OR = 40 mg/dL Final   Triglycerides  Date Value Ref Range Status  08/01/2022 81 <150 mg/dL Final         Passed - Patient is not pregnant      Passed - Valid encounter within last 12 months    Recent Outpatient Visits           2 months ago Chronic obstructive pulmonary disease, unspecified COPD type [facility])   [facility] Rockney Cid, DO       Future Appointments              In 1 week Rockney Cid, DO [facility]   In 3 weeks Estanislao Heimlich, Dennard Fisher, MD [facility] 

## 2023-12-25 DIAGNOSIS — Z7689 Persons encountering health services in other specified circumstances: Secondary | ICD-10-CM | POA: Diagnosis not present

## 2023-12-25 DIAGNOSIS — G08 Intracranial and intraspinal phlebitis and thrombophlebitis: Secondary | ICD-10-CM | POA: Diagnosis not present

## 2023-12-25 DIAGNOSIS — Z86718 Personal history of other venous thrombosis and embolism: Secondary | ICD-10-CM | POA: Diagnosis not present

## 2023-12-25 DIAGNOSIS — I82621 Acute embolism and thrombosis of deep veins of right upper extremity: Secondary | ICD-10-CM | POA: Diagnosis not present

## 2023-12-25 DIAGNOSIS — Z95828 Presence of other vascular implants and grafts: Secondary | ICD-10-CM | POA: Diagnosis not present

## 2023-12-26 ENCOUNTER — Encounter: Payer: Self-pay | Admitting: Internal Medicine

## 2023-12-26 ENCOUNTER — Ambulatory Visit: Admitting: Internal Medicine

## 2023-12-26 ENCOUNTER — Other Ambulatory Visit: Payer: Self-pay

## 2023-12-26 VITALS — BP 120/72 | HR 96 | Temp 98.1°F | Resp 16 | Ht 71.0 in | Wt 159.1 lb

## 2023-12-26 DIAGNOSIS — Z9109 Other allergy status, other than to drugs and biological substances: Secondary | ICD-10-CM

## 2023-12-26 DIAGNOSIS — E782 Mixed hyperlipidemia: Secondary | ICD-10-CM | POA: Diagnosis not present

## 2023-12-26 DIAGNOSIS — G08 Intracranial and intraspinal phlebitis and thrombophlebitis: Secondary | ICD-10-CM

## 2023-12-26 DIAGNOSIS — G500 Trigeminal neuralgia: Secondary | ICD-10-CM

## 2023-12-26 DIAGNOSIS — J3489 Other specified disorders of nose and nasal sinuses: Secondary | ICD-10-CM | POA: Diagnosis not present

## 2023-12-26 DIAGNOSIS — Z7689 Persons encountering health services in other specified circumstances: Secondary | ICD-10-CM | POA: Diagnosis not present

## 2023-12-26 MED ORDER — ROSUVASTATIN CALCIUM 20 MG PO TABS: 20.0000 mg | ORAL_TABLET | Freq: Every day | ORAL | 1 refills | Status: DC

## 2023-12-26 MED ORDER — OXCARBAZEPINE 300 MG PO TABS
300.0000 mg | ORAL_TABLET | Freq: Two times a day (BID) | ORAL | 1 refills | Status: DC
Start: 2023-12-26 — End: 2024-01-28

## 2023-12-26 MED ORDER — FLUTICASONE PROPIONATE 50 MCG/ACT NA SUSP: 2.0000 | Freq: Every day | NASAL | 6 refills | Status: AC

## 2023-12-26 MED ORDER — CETIRIZINE HCL 10 MG PO TABS
10.0000 mg | ORAL_TABLET | Freq: Every day | ORAL | 11 refills | Status: DC
Start: 2023-12-26 — End: 2024-01-28

## 2023-12-26 NOTE — Patient Instructions (Signed)
 Trigeminal Neuralgia  Trigeminal neuralgia is a nerve disorder that causes severe pain on one side of the face. The pain may last from a few seconds to several minutes, but it can happen hundreds of times a day. The pain is usually only on one side of the face. Symptoms may occur for days, weeks, or months and then go away for months or years. The pain may return and be worse than before. What are the causes? This condition may be caused by: Damage or pressure to a nerve in the head that is called the trigeminal nerve. An attack can be triggered by: Talking or chewing. Putting on makeup. Washing, shaving, or touching your face. Brushing your teeth. Blasts of hot or cold air. Primary demyelinating disorders, such as multiple sclerosis. Tumors. What increases the risk? You are more likely to develop this condition if: You are 31-10 years old. You are male. What are the signs or symptoms? The main symptom of this condition is severe pain in the jaw, lips, eyes, nose, scalp, forehead, and face. How is this diagnosed? This condition is diagnosed with a physical exam. A CT scan or an MRI may be done to rule out other conditions that can cause facial pain. How is this treated? This condition may be treated with: Measures to avoid the things that trigger your symptoms. Prescription medicines such as anticonvulsants. Procedures such as ablation, thermal, or radiation therapy. Cognitive or behavioral therapy. Complementary therapies such as: Gentle, regular exercise or yoga. Meditation. Aromatherapy. Acupuncture. Surgery. This may be done in severe cases if other medical treatment does not provide relief. It may take up to one month for treatment to start relieving the pain. Follow these instructions at home: Managing pain  Learn as much as you can about how to manage your pain. Ask your health care provider if a pain specialist would be helpful. Consider talking with a mental health  care provider about how to cope with the pain. Consider joining a pain support group. General instructions Take over-the-counter and prescription medicines only as told by your health care provider. Avoid the things that trigger your symptoms. It may help to: Chew on the unaffected side of your mouth. Avoid touching your face. Avoid blasts of hot or cold air. Keep all follow-up visits. Where to find more information Facial Pain Association: facepain.org Contact a health care provider if: Your medicine is not helping your symptoms. You have side effects from the medicine used for treatment. You develop new, unexplained symptoms, such as: Double vision. Facial weakness or numbness. Changes in hearing or balance. You feel depressed. Get help right away if: Your pain is severe and is not getting better. You develop suicidal thoughts. If you ever feel like you may hurt yourself or others, or have thoughts about taking your own life, get help right away. Go to your nearest emergency department or: Call your local emergency services (911 in the [place] Call a suicide crisis helpline, such as the National Suicide Prevention Lifeline at 857-268-9197 or 988 in the [place] This is open 24 hours a day in the [place] If you're a Veteran: Call 988 and press 1. This is open 24 hours a day. Text the [employer] at 587-680-7818. Summary Trigeminal neuralgia is a nerve disorder that causes severe pain on one side of the face. The pain may last from a few seconds to several minutes. This condition is caused by damage or pressure to a nerve in the head that is called  the trigeminal nerve. Treatment may include avoiding the things that trigger your symptoms, taking medicines, or having procedures or surgery. It may take up to one month for treatment to start relieving the pain. Keep all follow-up visits. This information is not intended to replace advice given to you by your health care provider. Make sure  you discuss any questions you have with your health care provider. Document Revised: 02/21/2023 Document Reviewed: 01/02/2021 Elsevier Patient Education  2024 [employer].

## 2023-12-26 NOTE — Progress Notes (Signed)
 Established Patient Office Visit  Subjective    Patient ID: Clarence Skinner, male    DOB: 03-28-1961  Age: 63 y.o. MRN: [id number]  CC:  Chief Complaint  Patient presents with   Medical Management of Chronic Issues    HPI Clarence Skinner presents to follow up on chronic medical conditions.   Discussed the use of AI scribe software for clinical note transcription with the patient, who gave verbal consent to proceed.  History of Present Illness Clarence Skinner is a 63 year old male with a history of blood clot who presents with ear pain, gum swelling, and throat discomfort.  He experiences significant ear pain, gum swelling, and throat discomfort, typically in the evening. The throat burns, followed by ear pain described as a 'lightning strike', and gum swelling, especially near his dental implants. Since starting Eliquis, headaches have lessened, but the described symptoms persist.  Pepper and chocolate may exacerbate throat discomfort. He has undergone radiation and chemotherapy twice, which may affect sensitivity to certain foods or environmental factors. He takes Tylenol  for pain relief and loratadine  for allergies, though it is not effective. No specific triggers such as chewing, talking, or exposure to hot or cold air cause the symptoms, but significant gum swelling and pain occur.     Outpatient Encounter Medications as of 12/26/2023  Medication Sig   albuterol  (VENTOLIN  HFA) 108 (90 Base) MCG/ACT inhaler Inhale 1-2 puffs into the lungs every 6 (six) hours as needed for wheezing or shortness of breath.   allopurinol  (ZYLOPRIM ) 300 MG tablet Take 1 tablet (300 mg total) by mouth daily.   azelastine  (ASTELIN ) 0.1 % nasal spray Place 1 spray into both nostrils 2 (two) times daily. Use in each nostril as directed   Fluticasone -Umeclidin-Vilant (TRELEGY ELLIPTA ) 100-62.5-25 MCG/ACT AEPB Inhale 1 puff into the lungs daily.   ipratropium-albuterol  (DUONEB) 0.5-2.5 (3) MG/3ML SOLN Take 3  mLs by nebulization every 4 (four) hours as needed (wheezing, shortness of breath).   loratadine  (CLARITIN ) 10 MG tablet Take 1 tablet (10 mg total) by mouth daily.   pantoprazole  (PROTONIX ) 40 MG tablet TAKE 1 TABLET BY MOUTH EVERY DAY   rosuvastatin  (CRESTOR ) 20 MG tablet TAKE 1 TABLET BY MOUTH EVERY DAY   tadalafil  (CIALIS ) 20 MG tablet Take 1 tablet (20 mg total) by mouth daily.   colchicine 0.6 MG tablet Take 0.6 mg by mouth daily. (Patient not taking: Reported on 12/26/2023)   furosemide  (LASIX ) 20 MG tablet Take 20 mg by mouth as needed. (Patient not taking: Reported on 12/26/2023)   Vitamin D, Ergocalciferol, (DRISDOL) 1.25 MG (50000 UNIT) CAPS capsule TAKE ONE CAPSULE BY MOUTH BY ONCE WEEKLY (Patient not taking: Reported on 12/26/2023)   Facility-Administered Encounter Medications as of 12/26/2023  Medication   sodium chloride  flush (NS) 0.9 % injection 10 mL    Past Medical History:  Diagnosis Date   COPD (chronic obstructive pulmonary disease) (HCC)    ED (erectile dysfunction)    GERD (gastroesophageal reflux disease)    Gout    Hypertension    Multilevel degenerative disc disease    Pneumonia 11/29/2017   Prostate cancer (HCC)    Sleep apnea    CPAP   Tachycardia    Wears dentures    partial upper and lower    Past Surgical History:  Procedure Laterality Date   ABCESS DRAINAGE  2009   tonsil   COLONOSCOPY WITH PROPOFOL  N/A 02/11/2018   Procedure: COLONOSCOPY WITH PROPOFOL ;  Surgeon: Corky Diener, Teodoro K, MD;  Location: [facility];  Service: Endoscopy;  Laterality: N/A;   ESOPHAGOGASTRODUODENOSCOPY (EGD) WITH PROPOFOL  N/A 02/11/2018   Procedure: ESOPHAGOGASTRODUODENOSCOPY (EGD) WITH PROPOFOL ;  Surgeon: Toledo, Alphonsus Jeans, MD;  Location: [facility];  Service: Endoscopy;  Laterality: N/A;   IR IMAGING GUIDED PORT INSERTION  04/09/2022   MASS EXCISION Right 08/14/2017   Procedure: EXCISION RIGHT TEMPORAL MASS SUBMENTAL MASS;  Surgeon: Rogers Clayman, MD;  Location: [facility];  Service: ENT;  Laterality: Right;  sleep apnea   RADIOACTIVE SEED IMPLANT N/A 01/26/2019   Procedure: RADIOACTIVE SEED IMPLANT/BRACHYTHERAPY IMPLANT;  Surgeon: Lawerence Pressman, MD;  Location: [facility];  Service: Urology;  Laterality: N/A;   SEPTOPLASTY  2016   [place]   VOLUME STUDY N/A 12/29/2018   Procedure: VOLUME STUDY;  Surgeon: Lawerence Pressman, MD;  Location: [facility];  Service: Urology;  Laterality: N/A;    Family History  Problem Relation Age of Onset   Anxiety disorder Mother    Cancer Father    Hypertension Sister    Diabetes Sister    Asthma Sister    Other Brother        mva  at age 47   Gout Brother    Hypertension Brother    Prostate cancer Maternal Uncle    Prostate cancer Maternal Uncle     Social History   Socioeconomic History   Marital status: Significant Other    Spouse name: Not on file   Number of children: Not on file   Years of education: Not on file   Highest education level: GED or equivalent  Occupational History   Not on file  Tobacco Use   Smoking status: Every Day    Current packs/day: 1.00    Average packs/day: 1 pack/day for 40.0 years (40.0 ttl pk-yrs)    Types: Cigarettes    Passive exposure: Current   Smokeless tobacco: Former    Types: Snuff, Chew    Quit date: 12/05/1984   Tobacco comments:    since age 71  Vaping Use   Vaping status: Never Used  Substance and Sexual Activity   Alcohol use: Not Currently    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   Hunger Vital Sign    Worried About Running Out of Food in  the Last Year: Never true    Ran Out of Food in the Last Year: Never true  Transportation Needs: No Transportation Needs (12/04/2023)   [facility]   PRAPARE - Transportation    Lack of Transportation (Medical): No    Lack of Transportation (Non-Medical): No  Physical Activity: Unknown (07/31/2023)   Exercise Vital Sign    Days of Exercise per Week: Patient declined    Minutes of Exercise per Session: Not on file  Stress: No Stress Concern Present (07/31/2023)   [employer] of Occupational Health - Occupational Stress Questionnaire    Feeling of Stress : Only a little  Social Connections: Unknown (07/31/2023)   Social Connection and Isolation Panel [NHANES]  Frequency of Communication with Friends and Family: Patient declined    Frequency of Social Gatherings with Friends and Family: Patient declined    Attends Religious Services: Patient declined    Database administrator or Organizations: No    Attends Engineer, structural: Not on file    Marital Status: Divorced  Recent Concern: Social Connections - Socially Isolated (06/25/2023)   Social Connection and Isolation Panel [NHANES]    Frequency of Communication with Friends and Family: More than three times a week    Frequency of Social Gatherings with Friends and Family: Once a week    Attends Religious Services: Never    Database administrator or Organizations: No    Attends Engineer, structural: Not on file    Marital Status: Divorced  Intimate Partner Violence: Not At Risk (04/03/2022)   Humiliation, Afraid, Rape, and Kick questionnaire    Fear of Current or Ex-Partner: No    Emotionally Abused: No    Physically Abused: No    Sexually Abused: No    Review of Systems  Constitutional:  Negative for chills and fever.  HENT:  Positive for ear pain. Negative for congestion and sore throat.   Neurological:  Negative for headaches.      Objective    BP 120/72 (Cuff Size: Large)   Pulse 96    Temp 98.1 F (36.7 C) (Oral)   Resp 16   Ht 5\' 11"  (1.803 m)   Wt 159 lb 1.6 oz (72.2 kg)   SpO2 98%   BMI 22.19 kg/m   Physical Exam Constitutional:      Appearance: Normal appearance.  HENT:     Head: Normocephalic and atraumatic.     Right Ear: Tympanic membrane, ear canal and external ear normal.     Left Ear: Tympanic membrane, ear canal and external ear normal.     Mouth/Throat:     Mouth: Mucous membranes are moist.     Dentition: Gingival swelling and gum lesions present.     Pharynx: Oropharynx is clear.  Eyes:     Conjunctiva/sclera: Conjunctivae normal.  Cardiovascular:     Rate and Rhythm: Normal rate and regular rhythm.  Pulmonary:     Effort: Pulmonary effort is normal.     Breath sounds: Normal breath sounds.  Skin:    General: Skin is warm and dry.  Neurological:     General: No focal deficit present.     Mental Status: He is alert. Mental status is at baseline.  Psychiatric:        Mood and Affect: Mood normal.        Behavior: Behavior normal.     Last CBC Lab Results  Component Value Date   WBC 6.2 10/15/2022   HGB 12.8 (L) 10/15/2022   HCT 39.7 10/15/2022   MCV 79.6 (L) 10/15/2022   MCH 25.7 (L) 10/15/2022   RDW 15.6 (H) 10/15/2022   PLT 309 10/15/2022   Last metabolic panel Lab Results  Component Value Date   GLUCOSE 117 (H) 10/15/2022   NA 132 (L) 10/15/2022   K 3.8 10/15/2022   CL 100 10/15/2022   CO2 24 10/15/2022   BUN 14 10/15/2022   CREATININE 0.78 10/15/2022   GFRNONAA >60 10/15/2022   CALCIUM  9.0 10/15/2022   PROT 7.7 10/15/2022   ALBUMIN 4.4 10/15/2022   BILITOT 0.3 10/15/2022   ALKPHOS 70 10/15/2022   AST 18 10/15/2022   ALT 31 10/15/2022   ANIONGAP  8 10/15/2022   Last lipids Lab Results  Component Value Date   CHOL 152 08/01/2022   HDL 54 08/01/2022   LDLCALC 82 08/01/2022   TRIG 81 08/01/2022   CHOLHDL 2.8 08/01/2022   Last hemoglobin A1c Lab Results  Component Value Date   HGBA1C 5.5 06/27/2023    Last thyroid  functions Lab Results  Component Value Date   TSH 0.978 07/05/2022   Last vitamin D No results found for: "25OHVITD2", "25OHVITD3", "VD25OH" Last vitamin B12 and Folate No results found for: "VITAMINB12", "FOLATE"      Assessment & Plan:   Assessment & Plan Trigeminal Neuralgia Severe right-sided facial pain consistent with trigeminal neuralgia, possibly related to prior radiation therapy. - Prescribe Trileptal 300 mg twice daily. - Schedule follow-up in one month to assess response. - Provided information on trigeminal neuralgia.  Cerebral Venous Thrombosis of Sigmoid Sinus On Eliquis 5 mg BID for six months to prevent further clot formation. Advised against anti-inflammatory medications due to bleeding risk. - Continue Eliquis 5 mg twice daily for six months. - Avoid anti-inflammatory medications such as ibuprofen , naproxen , and meloxicam .  Allergic Rhinitis Loratadine  ineffective. Switching to Zyrtec and adding Flonase  for better symptom control. - Prescribe Zyrtec (cetirizine) as a replacement for loratadine . - Prescribe Flonase  nasal spray for congestion and pressure.  HLD Stable. - Refill statin.   - Oxcarbazepine (TRILEPTAL) 300 MG tablet; Take 1 tablet (300 mg total) by mouth 2 (two) times daily.  Dispense: 60 tablet; Refill: 1 - cetirizine (ZYRTEC) 10 MG tablet; Take 1 tablet (10 mg total) by mouth daily.  Dispense: 30 tablet; Refill: 11 - fluticasone  (FLONASE ) 50 MCG/ACT nasal spray; Place 2 sprays into both nostrils daily.  Dispense: 16 g; Refill: 6 - rosuvastatin  (CRESTOR ) 20 MG tablet; Take 1 tablet (20 mg total) by mouth daily.  Dispense: 90 tablet; Refill: 1    Return in about 4 weeks (around 01/23/2024) for follow up for medication recheck.   Rockney Cid, DO

## 2024-01-01 DIAGNOSIS — H9203 Otalgia, bilateral: Secondary | ICD-10-CM | POA: Diagnosis not present

## 2024-01-01 DIAGNOSIS — C148 Malignant neoplasm of overlapping sites of lip, oral cavity and pharynx: Secondary | ICD-10-CM | POA: Diagnosis not present

## 2024-01-01 DIAGNOSIS — Z7689 Persons encountering health services in other specified circumstances: Secondary | ICD-10-CM | POA: Diagnosis not present

## 2024-01-01 DIAGNOSIS — M26609 Unspecified temporomandibular joint disorder, unspecified side: Secondary | ICD-10-CM | POA: Diagnosis not present

## 2024-01-01 DIAGNOSIS — J310 Chronic rhinitis: Secondary | ICD-10-CM | POA: Diagnosis not present

## 2024-01-02 DIAGNOSIS — Z419 Encounter for procedure for purposes other than remedying health state, unspecified: Secondary | ICD-10-CM | POA: Diagnosis not present

## 2024-01-03 ENCOUNTER — Encounter: Payer: Self-pay | Admitting: Oncology

## 2024-01-06 ENCOUNTER — Other Ambulatory Visit: Payer: Self-pay

## 2024-01-07 ENCOUNTER — Other Ambulatory Visit: Payer: Self-pay | Admitting: Urology

## 2024-01-07 DIAGNOSIS — N529 Male erectile dysfunction, unspecified: Secondary | ICD-10-CM

## 2024-01-09 ENCOUNTER — Ambulatory Visit: Payer: Self-pay | Admitting: Urology

## 2024-01-22 ENCOUNTER — Other Ambulatory Visit: Payer: Self-pay

## 2024-01-22 ENCOUNTER — Other Ambulatory Visit

## 2024-01-22 DIAGNOSIS — C61 Malignant neoplasm of prostate: Secondary | ICD-10-CM

## 2024-01-23 ENCOUNTER — Other Ambulatory Visit: Payer: Self-pay

## 2024-01-23 ENCOUNTER — Ambulatory Visit (INDEPENDENT_AMBULATORY_CARE_PROVIDER_SITE_OTHER): Admitting: Internal Medicine

## 2024-01-23 ENCOUNTER — Encounter: Payer: Self-pay | Admitting: Internal Medicine

## 2024-01-23 VITALS — BP 124/82 | HR 98 | Temp 98.7°F | Resp 16 | Ht 71.0 in | Wt 156.2 lb

## 2024-01-23 DIAGNOSIS — J328 Other chronic sinusitis: Secondary | ICD-10-CM | POA: Diagnosis not present

## 2024-01-23 DIAGNOSIS — G500 Trigeminal neuralgia: Secondary | ICD-10-CM | POA: Diagnosis not present

## 2024-01-23 LAB — PSA: Prostate Specific Ag, Serum: 0.3 ng/mL (ref 0.0–4.0)

## 2024-01-23 NOTE — Progress Notes (Signed)
 Established Patient Office Visit  Subjective    Patient ID: Clarence Skinner, male    DOB: [birth year]  Age: 63 y.o. MRN: [id number]  CC:  Chief Complaint  Patient presents with   Follow-up    4 week recheck    HPI Clarence Skinner presents to follow up on chronic medical conditions.   Discussed the use of AI scribe software for clinical note transcription with the patient, who gave verbal consent to proceed.  History of Present Illness Clarence Skinner is a 63 year old male with trigeminal neuralgia who presents with worsening throat pain and nerve pain.  He experiences a burning throat pain that worsens with physical activity, accompanied by gum swelling and severe right-sided headaches. These symptoms were present before starting Trileptal , which he believes exacerbated the throat burning. Discontinuation of Trileptal  led to some improvement in the burning sensation.  He has trigeminal neuralgia with sharp, shooting facial pains, particularly in the jaw and gums, and difficulty opening his mouth. He was taking Trileptal  twice daily but stopped due to throat irritation concerns.  He experiences sinus drainage and nasal congestion, managed with saline rinses, though excessive use causes nasal dryness and worsens the burning sensation. He has stopped loratadine , Zyrtec , Flonase , and metoprazole due to dryness and burning.  He takes Eliquis twice daily and uses mouthwash to coat his throat at night. He has a history of radiation and chemotherapy for neck cancer, which may contribute to scarring and recurrent throat issues. He smokes, which may exacerbate his symptoms. No ear infection is present despite ear pain, and there is sinus drainage and throat redness.    Outpatient Encounter Medications as of 01/23/2024  Medication Sig   albuterol  (VENTOLIN  HFA) 108 (90 Base) MCG/ACT inhaler Inhale 1-2 puffs into the lungs every 6 (six) hours as needed for wheezing or shortness of breath.    allopurinol  (ZYLOPRIM ) 300 MG tablet Take 1 tablet (300 mg total) by mouth daily.   apixaban (ELIQUIS) 5 MG TABS tablet Take 1 tablet (5 mg total) by mouth 2 (two) times daily.   fluticasone  (FLONASE ) 50 MCG/ACT nasal spray Place 2 sprays into both nostrils daily.   Fluticasone -Umeclidin-Vilant (TRELEGY ELLIPTA ) 100-62.5-25 MCG/ACT AEPB Inhale 1 puff into the lungs daily.   furosemide  (LASIX ) 20 MG tablet Take 20 mg by mouth as needed.   ipratropium-albuterol  (DUONEB) 0.5-2.5 (3) MG/3ML SOLN Take 3 mLs by nebulization every 4 (four) hours as needed (wheezing, shortness of breath).   pantoprazole  (PROTONIX ) 40 MG tablet TAKE 1 TABLET BY MOUTH EVERY DAY   rosuvastatin  (CRESTOR ) 20 MG tablet Take 1 tablet (20 mg total) by mouth daily.   tadalafil  (CIALIS ) 20 MG tablet TAKE 1 TABLET BY MOUTH EVERY DAY   azelastine  (ASTELIN ) 0.1 % nasal spray Place 1 spray into both nostrils 2 (two) times daily. Use in each nostril as directed (Patient not taking: Reported on 01/23/2024)   cetirizine  (ZYRTEC ) 10 MG tablet Take 1 tablet (10 mg total) by mouth daily. (Patient not taking: Reported on 01/23/2024)   Oxcarbazepine  (TRILEPTAL ) 300 MG tablet Take 1 tablet (300 mg total) by mouth 2 (two) times daily. (Patient not taking: Reported on 01/23/2024)   Facility-Administered Encounter Medications as of 01/23/2024  Medication   sodium chloride  flush (NS) 0.9 % injection 10 mL    Past Medical History:  Diagnosis Date   COPD (chronic obstructive pulmonary disease) (HCC)    ED (erectile dysfunction)    GERD (gastroesophageal reflux disease)    Gout  Hypertension    Multilevel degenerative disc disease    Pneumonia 11/29/2017   Prostate cancer (HCC)    Sleep apnea    CPAP   Tachycardia    Wears dentures    partial upper and lower    Past Surgical History:  Procedure Laterality Date   ABCESS DRAINAGE  2009   tonsil   COLONOSCOPY WITH PROPOFOL  N/A 02/11/2018   Procedure: COLONOSCOPY WITH PROPOFOL ;  Surgeon:  Toledo, Ladell POUR, MD;  Location: [facility];  Service: Endoscopy;  Laterality: N/A;   ESOPHAGOGASTRODUODENOSCOPY (EGD) WITH PROPOFOL  N/A 02/11/2018   Procedure: ESOPHAGOGASTRODUODENOSCOPY (EGD) WITH PROPOFOL ;  Surgeon: Toledo, Ladell POUR, MD;  Location: [facility];  Service: Endoscopy;  Laterality: N/A;   IR IMAGING GUIDED PORT INSERTION  04/09/2022   MASS EXCISION Right 08/14/2017   Procedure: EXCISION RIGHT TEMPORAL MASS SUBMENTAL MASS;  Surgeon: Milissa Hamming, MD;  Location: [facility];  Service: ENT;  Laterality: Right;  sleep apnea   RADIOACTIVE SEED IMPLANT N/A 01/26/2019   Procedure: RADIOACTIVE SEED IMPLANT/BRACHYTHERAPY IMPLANT;  Surgeon: Francisca Redell BROCKS, MD;  Location: [facility];  Service: Urology;  Laterality: N/A;   SEPTOPLASTY  2016   [place]   VOLUME STUDY N/A 12/29/2018   Procedure: VOLUME STUDY;  Surgeon: Francisca Redell BROCKS, MD;  Location: [facility];  Service: Urology;  Laterality: N/A;    Family History  Problem Relation Age of Onset   Anxiety disorder Mother    Cancer Father    Hypertension Sister    Diabetes Sister    Asthma Sister    Other Brother        mva  at age 20   Gout Brother    Hypertension Brother    Prostate cancer Maternal Uncle    Prostate cancer Maternal Uncle     Social History   Socioeconomic History   Marital status: Significant Other    Spouse name: Not on file   Number of children: Not on file   Years of education: Not on file   Highest education level: GED or equivalent  Occupational History   Not on file  Tobacco Use   Smoking status: Every Day    Current packs/day: 1.00    Average packs/day: 1 pack/day for 40.0 years (40.0 ttl pk-yrs)    Types: Cigarettes    Passive exposure: Current   Smokeless tobacco: Former    Types: Snuff, Chew    Quit date: 12/05/1984   Tobacco comments:    since age 58  Vaping Use   Vaping status: Never Used  Substance and Sexual Activity   Alcohol use: Not Currently    Comment:  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   Drug use: Not Currently   Sexual activity: Yes  Other Topics Concern   Not on file  Social History Narrative   Not on file   Social Drivers of Health   Financial Resource Strain: Patient Declined (01/22/2024)   Overall Financial Resource Strain (CARDIA)    Difficulty of Paying Living Expenses: Patient declined  Food Insecurity: Unknown (01/22/2024)   Hunger Vital Sign    Worried About Running Out of Food in the Last Year: Not on file    Ran Out of Food in the Last Year: Patient declined  Transportation Needs: Patient Declined (01/22/2024)   PRAPARE - Transportation    Lack of Transportation (Medical): Patient declined    Lack of Transportation (Non-Medical): Patient declined  Physical Activity: Inactive (01/22/2024)   Exercise Vital Sign  Days of Exercise per Week: 0 days    Minutes of Exercise per Session: Not on file  Stress: No Stress Concern Present (01/22/2024)   [employer] of Occupational Health - Occupational Stress Questionnaire    Feeling of Stress: Only a little  Social Connections: Unknown (01/22/2024)   Social Connection and Isolation Panel    Frequency of Communication with Friends and Family: Once a week    Frequency of Social Gatherings with Friends and Family: Patient declined    Attends Religious Services: Patient declined    Database administrator or Organizations: No    Attends Engineer, structural: Not on file    Marital Status: Patient declined  Intimate Partner Violence: Not At Risk (04/03/2022)   Humiliation, Afraid, Rape, and Kick questionnaire    Fear of Current or Ex-Partner: No    Emotionally Abused: No    Physically Abused: No    Sexually Abused: No    Review of Systems  Constitutional:  Negative for chills and fever.  HENT:  Positive for ear pain. Negative for congestion and sore throat.   Neurological:  Negative for headaches.      Objective    BP 124/82   Pulse 98   Temp 98.7 F  (37.1 C) (Oral)   Resp 16   Ht 5' 11 (1.803 m)   Wt 156 lb 3.2 oz (70.9 kg)   SpO2 98%   BMI 21.79 kg/m   Physical Exam Constitutional:      Appearance: Normal appearance.  HENT:     Head: Normocephalic and atraumatic.     Right Ear: Tympanic membrane, ear canal and external ear normal.     Left Ear: Tympanic membrane, ear canal and external ear normal.     Mouth/Throat:     Mouth: Mucous membranes are moist.     Dentition: Gingival swelling and gum lesions present.     Pharynx: Oropharynx is clear.  Eyes:     Conjunctiva/sclera: Conjunctivae normal.  Cardiovascular:     Rate and Rhythm: Normal rate and regular rhythm.  Pulmonary:     Effort: Pulmonary effort is normal.     Breath sounds: Normal breath sounds.  Skin:    General: Skin is warm and dry.  Neurological:     General: No focal deficit present.     Mental Status: He is alert. Mental status is at baseline.  Psychiatric:        Mood and Affect: Mood normal.        Behavior: Behavior normal.     Last CBC Lab Results  Component Value Date   WBC 6.2 10/15/2022   HGB 12.8 (L) 10/15/2022   HCT 39.7 10/15/2022   MCV 79.6 (L) 10/15/2022   MCH 25.7 (L) 10/15/2022   RDW 15.6 (H) 10/15/2022   PLT 309 10/15/2022   Last metabolic panel Lab Results  Component Value Date   GLUCOSE 117 (H) 10/15/2022   NA 132 (L) 10/15/2022   K 3.8 10/15/2022   CL 100 10/15/2022   CO2 24 10/15/2022   BUN 14 10/15/2022   CREATININE 0.78 10/15/2022   GFRNONAA >60 10/15/2022   CALCIUM  9.0 10/15/2022   PROT 7.7 10/15/2022   ALBUMIN 4.4 10/15/2022   BILITOT 0.3 10/15/2022   ALKPHOS 70 10/15/2022   AST 18 10/15/2022   ALT 31 10/15/2022   ANIONGAP 8 10/15/2022   Last lipids Lab Results  Component Value Date   CHOL 152 08/01/2022   HDL 54 08/01/2022  LDLCALC 82 08/01/2022   TRIG 81 08/01/2022   CHOLHDL 2.8 08/01/2022   Last hemoglobin A1c Lab Results  Component Value Date   HGBA1C 5.5 06/27/2023   Last thyroid   functions Lab Results  Component Value Date   TSH 0.978 07/05/2022   Last vitamin D No results found for: 25OHVITD2, 25OHVITD3, VD25OH Last vitamin B12 and Folate No results found for: VITAMINB12, FOLATE      Assessment & Plan:   Assessment & Plan Trigeminal Neuralgia Intermittent, severe facial pain on the right side, likely due to trigeminal nerve inflammation. It is up to him if he wants to try the Trileptal  again.  - Instruct to monitor pain levels and symptoms. - Advise consultation with ENT specialist for further evaluation. - Recommend smoking cessation.   Chronic Sinusitis Sinus drainage, nasal congestion, and throat burning possibly linked to sinus issues. Previous medications discontinued due to drying effects. Radiation history may contribute to symptoms. - Advise use of humidifier in bedroom. - Recommend ENT specialist consultation for further evaluation and potential imaging.    Return in about 3 months (around 04/24/2024).   [name], DO

## 2024-01-28 ENCOUNTER — Encounter: Payer: Self-pay | Admitting: Internal Medicine

## 2024-01-28 ENCOUNTER — Ambulatory Visit (INDEPENDENT_AMBULATORY_CARE_PROVIDER_SITE_OTHER): Admitting: Urology

## 2024-01-28 ENCOUNTER — Ambulatory Visit: Admitting: Urology

## 2024-01-28 ENCOUNTER — Other Ambulatory Visit: Payer: Self-pay | Admitting: Internal Medicine

## 2024-01-28 VITALS — BP 139/78 | HR 103 | Ht 69.0 in | Wt 157.4 lb

## 2024-01-28 DIAGNOSIS — N529 Male erectile dysfunction, unspecified: Secondary | ICD-10-CM

## 2024-01-28 DIAGNOSIS — C61 Malignant neoplasm of prostate: Secondary | ICD-10-CM | POA: Diagnosis not present

## 2024-01-28 DIAGNOSIS — C099 Malignant neoplasm of tonsil, unspecified: Secondary | ICD-10-CM | POA: Diagnosis not present

## 2024-01-28 DIAGNOSIS — M1A9XX0 Chronic gout, unspecified, without tophus (tophi): Secondary | ICD-10-CM

## 2024-01-28 MED ORDER — TADALAFIL 20 MG PO TABS
20.0000 mg | ORAL_TABLET | Freq: Every day | ORAL | 11 refills | Status: AC
Start: 2024-01-28 — End: ?

## 2024-01-28 NOTE — Progress Notes (Signed)
   01/28/2024 11:01 AM   [facility] 1961/01/05 [id number]  Reason for visit: Follow up prostate cancer, ED   HPI: He is a 63 year old African-American male who was diagnosed with favorable intermediate risk prostate cancer in January 2020 for an elevated PSA of 8.  His brachytherapy was slightly delayed secondary to the Covid pandemic, but he ultimately underwent brachytherapy seed implant with myself and Dr. Lenn on 01/26/2019.  PSA on 06/05/2019 was undetectable.  PSA has remained low, most recently 0.3 in July 2025, which is stable over the last few years.  Denies any urinary complaints today.  He is taking Cialis  20 mg daily for ED with good results.  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  He had questions about his port and how long that needed to stay in place, I placed a referral to oncology locally for port management.  Cialis  20 mg daily for ED, refilled RTC 1 year with PSA prior  Redell JAYSON Burnet, MD   [facility] [address] [place], [place] [address] [phone]

## 2024-01-30 ENCOUNTER — Telehealth: Payer: Self-pay

## 2024-01-30 NOTE — Telephone Encounter (Signed)
 Recived referral for port management. Spoke to pt and he is requesting port a cath to be removed. Informed him that since his oncology care is now being managed by [facility], he would need to reach out to them for port removal, or we would need a letter from [facility] provider stating that it is ok to remove port. Pt verbalized understanding. Mychart mssg also sent to pt with this information

## 2024-01-30 NOTE — Telephone Encounter (Signed)
 Requested Prescriptions  Pending Prescriptions Disp Refills   allopurinol  (ZYLOPRIM ) 300 MG tablet [Pharmacy Med Name: ALLOPURINOL  300 MG TABLET] 90 tablet 1    Sig: TAKE 1 TABLET BY MOUTH EVERY DAY     Endocrinology:  Gout Agents - allopurinol  Failed - 01/30/2024 10:22 AM      Failed - Uric Acid in normal range and within 360 days    No results found for: POCURA, LABURIC       Failed - Cr in normal range and within 360 days    Creatinine  Date Value Ref Range Status  10/03/2012 1.04 0.60 - 1.30 mg/dL Final   Creatinine, Ser  Date Value Ref Range Status  10/15/2022 0.78 0.61 - 1.24 mg/dL Final         Failed - CBC within normal limits and completed in the last 12 months    WBC  Date Value Ref Range Status  10/15/2022 6.2 4.0 - 10.5 K/uL Final   RBC  Date Value Ref Range Status  10/15/2022 4.99 4.22 - 5.81 MIL/uL Final   Hemoglobin  Date Value Ref Range Status  10/15/2022 12.8 (L) 13.0 - 17.0 g/dL Final   HGB  Date Value Ref Range Status  06/17/2013 12.1 (L) 13.0 - 18.0 g/dL Final   HCT  Date Value Ref Range Status  10/15/2022 39.7 39.0 - 52.0 % Final  06/17/2013 36.8 (L) 40.0 - 52.0 % Final   MCHC  Date Value Ref Range Status  10/15/2022 32.2 30.0 - 36.0 g/dL Final   [facility]  Date Value Ref Range Status  10/15/2022 25.7 (L) 26.0 - 34.0 pg Final   MCV  Date Value Ref Range Status  10/15/2022 79.6 (L) 80.0 - 100.0 fL Final  06/17/2013 80 80 - 100 fL Final   No results found for: PLTCOUNTKUC, LABPLAT, POCPLA RDW  Date Value Ref Range Status  10/15/2022 15.6 (H) 11.5 - 15.5 % Final  06/17/2013 16.8 (H) 11.5 - 14.5 % Final         Passed - Valid encounter within last 12 months    Recent Outpatient Visits           1 week ago Other chronic sinusitis   [facility] Bern[facility] Fend, DO   1 month ago Trigeminal neuralgia of right side of face   [facility] Fend, DO   4 months ago  Chronic obstructive pulmonary disease, unspecified COPD type [facility])   [facility] Bernardo Fend, DO       Future Appointments             In 12 months Clarence Redell BROCKS, MD [facility] [place]

## 2024-02-01 ENCOUNTER — Other Ambulatory Visit: Payer: Self-pay | Admitting: Internal Medicine

## 2024-02-01 DIAGNOSIS — M1A9XX0 Chronic gout, unspecified, without tophus (tophi): Secondary | ICD-10-CM

## 2024-02-01 DIAGNOSIS — Z419 Encounter for procedure for purposes other than remedying health state, unspecified: Secondary | ICD-10-CM | POA: Diagnosis not present

## 2024-02-03 NOTE — Telephone Encounter (Signed)
 Requested Prescriptions  Refused Prescriptions Disp Refills   allopurinol  (ZYLOPRIM ) 300 MG tablet [Pharmacy Med Name: ALLOPURINOL  300 MG TABLET] 90 tablet 0    Sig: TAKE 1 TABLET BY MOUTH EVERY DAY     Endocrinology:  Gout Agents - allopurinol  Failed - 02/03/2024  5:58 PM      Failed - Uric Acid in normal range and within 360 days    No results found for: POCURA, LABURIC       Failed - Cr in normal range and within 360 days    Creatinine  Date Value Ref Range Status  10/03/2012 1.04 0.60 - 1.30 mg/dL Final   Creatinine, Ser  Date Value Ref Range Status  10/15/2022 0.78 0.61 - 1.24 mg/dL Final         Failed - CBC within normal limits and completed in the last 12 months    WBC  Date Value Ref Range Status  10/15/2022 6.2 4.0 - 10.5 K/uL Final   RBC  Date Value Ref Range Status  10/15/2022 4.99 4.22 - 5.81 MIL/uL Final   Hemoglobin  Date Value Ref Range Status  10/15/2022 12.8 (L) 13.0 - 17.0 g/dL Final   HGB  Date Value Ref Range Status  06/17/2013 12.1 (L) 13.0 - 18.0 g/dL Final   HCT  Date Value Ref Range Status  10/15/2022 39.7 39.0 - 52.0 % Final  06/17/2013 36.8 (L) 40.0 - 52.0 % Final   MCHC  Date Value Ref Range Status  10/15/2022 32.2 30.0 - 36.0 g/dL Final   [facility]  Date Value Ref Range Status  10/15/2022 25.7 (L) 26.0 - 34.0 pg Final   MCV  Date Value Ref Range Status  10/15/2022 79.6 (L) 80.0 - 100.0 fL Final  06/17/2013 80 80 - 100 fL Final   No results found for: PLTCOUNTKUC, LABPLAT, POCPLA RDW  Date Value Ref Range Status  10/15/2022 15.6 (H) 11.5 - 15.5 % Final  06/17/2013 16.8 (H) 11.5 - 14.5 % Final         Passed - Valid encounter within last 12 months    Recent Outpatient Visits           1 week ago Other chronic sinusitis   [facility]end, DO   1 month ago Trigeminal neuralgia of right side of face   [facility]end, DO   4 months ago  Chronic obstructive pulmonary disease, unspecified COPD type [facility])   [facility] Bernardo Fend, DO       Future Appointments             In 11 months Francisca Redell BROCKS, MD [facility] [place]

## 2024-02-06 ENCOUNTER — Telehealth: Payer: Self-pay

## 2024-02-06 NOTE — Telephone Encounter (Signed)
Lvm for pt to call back and schedule an appt 

## 2024-02-06 NOTE — Telephone Encounter (Signed)
 Copied from CRM [id number]. Topic: Clinical - Medical Advice >> Feb 06, 2024  9:47 AM Travis F wrote: Reason for CRM: Patient is calling in because he says he has been having a burning sensation in his throat. He says he spoke with his provider who recommended he reach out to his ENT doctor. Patient said he reached out to his ENT and they advised him to speak with his primary care provider. Patient says his throat is really bothering him and he wants to know what he should do. He is asking is there a medication or something he can take to soothe the pain. Patient says he has a dentist appointment at 10:30 AM, and is requesting to be contacted after that time. Please follow up with patient.

## 2024-02-06 NOTE — Telephone Encounter (Signed)
 Pt will need an appointment

## 2024-02-07 ENCOUNTER — Other Ambulatory Visit: Payer: Self-pay

## 2024-02-07 ENCOUNTER — Encounter: Payer: Self-pay | Admitting: Internal Medicine

## 2024-02-07 ENCOUNTER — Ambulatory Visit: Admitting: Internal Medicine

## 2024-02-07 ENCOUNTER — Ambulatory Visit (INDEPENDENT_AMBULATORY_CARE_PROVIDER_SITE_OTHER): Admitting: Internal Medicine

## 2024-02-07 VITALS — BP 118/72 | HR 92 | Temp 97.9°F | Resp 18 | Ht 71.0 in | Wt 156.5 lb

## 2024-02-07 DIAGNOSIS — Z13220 Encounter for screening for lipoid disorders: Secondary | ICD-10-CM | POA: Diagnosis not present

## 2024-02-07 DIAGNOSIS — E559 Vitamin D deficiency, unspecified: Secondary | ICD-10-CM | POA: Diagnosis not present

## 2024-02-07 DIAGNOSIS — R7303 Prediabetes: Secondary | ICD-10-CM

## 2024-02-07 DIAGNOSIS — G500 Trigeminal neuralgia: Secondary | ICD-10-CM

## 2024-02-07 DIAGNOSIS — K219 Gastro-esophageal reflux disease without esophagitis: Secondary | ICD-10-CM

## 2024-02-07 MED ORDER — PANTOPRAZOLE SODIUM 40 MG PO TBEC: 40.0000 mg | DELAYED_RELEASE_TABLET | Freq: Two times a day (BID) | ORAL | 1 refills | Status: DC

## 2024-02-07 MED ORDER — BACLOFEN 10 MG PO TABS: 10.0000 mg | ORAL_TABLET | Freq: Two times a day (BID) | ORAL | 3 refills | Status: DC

## 2024-02-07 NOTE — Progress Notes (Signed)
 Acute Office Visit  Subjective:     Patient ID: Clarence Skinner, male    DOB: 1960/11/21, 63 y.o.   MRN: [id number]  Chief Complaint  Patient presents with   Follow-up    Burning in throat, cough    HPI Patient is in today for follow up on ongoing throat burning and facial pain. He is here with his wife.   Discussed the use of AI scribe software for clinical note transcription with the patient, who gave verbal consent to proceed.  History of Present Illness Clarence Skinner is a 63 year old male with trigeminal neuralgia and acid reflux who presents with severe facial pain and burning throat.  He experiences severe facial pain described as a 'lightning strike' sensation, primarily affecting one side of his face, including the jaw, ear, and cheek. The pain is severe but brief and can occur at any time. He previously tried Tegretol but could not tolerate it due to side effects and has also used gabapentin .  He has a burning sensation in his throat associated with acid reflux, worsened by lying flat, heat, or physical exertion. He takes pantoprazole  40 mg daily, which has slightly improved the burning.  Recent dental procedures, including gum shaving and bone work, may have contributed to his symptoms. He has experienced significant weight loss, from 167 pounds in January to 156 pounds currently, due to difficulty eating and lack of appetite.  He is on Eliquis, complicating trigeminal neuralgia management, and takes vitamin D for a deficiency identified before his cancer diagnosis.    Review of Systems  HENT:  Positive for ear pain and sore throat.         Objective:    BP 118/72 (Cuff Size: Large)   Pulse 92   Temp 97.9 F (36.6 C) (Oral)   Resp 18   Ht 5' 11 (1.803 m)   Wt 156 lb 8 oz (71 kg)   SpO2 98%   BMI 21.83 kg/m  BP Readings from Last 3 Encounters:  02/07/24 118/72  01/28/24 139/78  01/23/24 124/82   Wt Readings from Last 3 Encounters:  02/07/24 156 lb 8  oz (71 kg)  01/28/24 157 lb 6.4 oz (71.4 kg)  01/23/24 156 lb 3.2 oz (70.9 kg)      Physical Exam Constitutional:      Appearance: Normal appearance.  HENT:     Head: Normocephalic and atraumatic.     Mouth/Throat:     Mouth: Mucous membranes are moist.     Pharynx: Oropharynx is clear.     Comments: Mild sinus drainage, no other abnormalities  Eyes:     Conjunctiva/sclera: Conjunctivae normal.  Cardiovascular:     Rate and Rhythm: Normal rate and regular rhythm.  Pulmonary:     Effort: Pulmonary effort is normal.     Breath sounds: Normal breath sounds.  Skin:    General: Skin is warm and dry.  Neurological:     General: No focal deficit present.     Mental Status: He is alert. Mental status is at baseline.  Psychiatric:        Mood and Affect: Mood normal.        Behavior: Behavior normal.     No results found for any visits on 02/07/24.      Assessment & Plan:   Assessment & Plan Trigeminal Neuralgia Chronic facial pain, previous Tegretol intolerance, anticoagulation complicates treatment. Baclofen  preferred, gabapentin  less effective. - Prescribe baclofen  twice daily. - Monitor response to  baclofen  over three months. - Consider gabapentin  if baclofen  is ineffective.  Gastroesophageal Reflux Disease (GERD) Chronic condition, partial relief with pantoprazole , risks with long-term high-dose use. Temporary dosage increase planned. - Increase pantoprazole  to 40 mg twice daily for one month. - Advise sleeping with head elevated. - Reassess pantoprazole  dosage after one month.  Vitamin D Deficiency Long-term high-dose supplementation, risk of hypervitaminosis D. Current levels unknown. - Order vitamin D level test. - Adjust vitamin D supplementation based on results.  General Health Maintenance Immunocompromised status qualifies for RSV vaccine. Shingles vaccine confirmed. Lipid panel not recently checked. - Verify insurance coverage for RSV vaccine. -  Administer RSV vaccine at pharmacy if covered. - Order lipid panel. - Schedule follow-up for lab results.  Follow-up Scheduled to assess treatment efficacy and adjust plans. Labs to be reviewed. - Maintain appointment in October. - Review lab results on Monday and adjust vitamin D supplementation accordingly.  - baclofen  (LIORESAL ) 10 MG tablet; Take 1 tablet (10 mg total) by mouth 2 (two) times daily.  Dispense: 60 each; Refill: 3 - Vitamin B12 - pantoprazole  (PROTONIX ) 40 MG tablet; Take 1 tablet (40 mg total) by mouth 2 (two) times daily.  Dispense: 60 tablet; Refill: 1 - Lipid Profile - Vitamin D (25 hydroxy) - HgB A1c   Return for already scheduled.  [name], DO

## 2024-02-08 LAB — LIPID PANEL
Cholesterol: 95 mg/dL (ref <200)
HDL: 54 mg/dL (ref >=40)
LDL Cholesterol (Calc): 29 mg/dL
Non-HDL Cholesterol (Calc): 41 mg/dL (ref <130)
Total CHOL/HDL Ratio: 1.8 (calc) (ref <5.0)
Triglycerides: 50 mg/dL (ref <150)

## 2024-02-08 LAB — HEMOGLOBIN A1C
Hgb A1c MFr Bld: 5.6 % (ref <5.7)
Mean Plasma Glucose: 114 mg/dL
eAG (mmol/L): 6.3 mmol/L

## 2024-02-08 LAB — VITAMIN D 25 HYDROXY (VIT D DEFICIENCY, FRACTURES): Vit D, 25-Hydroxy: 125 ng/mL — ABNORMAL HIGH (ref 30–100)

## 2024-02-08 LAB — VITAMIN B12: Vitamin B-12: 610 pg/mL (ref 200–1100)

## 2024-02-10 ENCOUNTER — Ambulatory Visit: Payer: Self-pay | Admitting: Internal Medicine

## 2024-02-11 ENCOUNTER — Other Ambulatory Visit: Payer: Self-pay | Admitting: Internal Medicine

## 2024-02-27 DIAGNOSIS — C099 Malignant neoplasm of tonsil, unspecified: Secondary | ICD-10-CM | POA: Diagnosis not present

## 2024-02-29 ENCOUNTER — Other Ambulatory Visit: Payer: Self-pay | Admitting: Internal Medicine

## 2024-02-29 DIAGNOSIS — K219 Gastro-esophageal reflux disease without esophagitis: Secondary | ICD-10-CM

## 2024-03-03 DIAGNOSIS — G4733 Obstructive sleep apnea (adult) (pediatric): Secondary | ICD-10-CM | POA: Diagnosis not present

## 2024-03-03 DIAGNOSIS — Z419 Encounter for procedure for purposes other than remedying health state, unspecified: Secondary | ICD-10-CM | POA: Diagnosis not present

## 2024-03-03 DIAGNOSIS — J449 Chronic obstructive pulmonary disease, unspecified: Secondary | ICD-10-CM | POA: Diagnosis not present

## 2024-03-04 DIAGNOSIS — Z7689 Persons encountering health services in other specified circumstances: Secondary | ICD-10-CM | POA: Diagnosis not present

## 2024-03-04 DIAGNOSIS — C099 Malignant neoplasm of tonsil, unspecified: Secondary | ICD-10-CM | POA: Diagnosis not present

## 2024-03-04 DIAGNOSIS — R1310 Dysphagia, unspecified: Secondary | ICD-10-CM | POA: Diagnosis not present

## 2024-03-04 DIAGNOSIS — B370 Candidal stomatitis: Secondary | ICD-10-CM | POA: Diagnosis not present

## 2024-03-04 DIAGNOSIS — Z923 Personal history of irradiation: Secondary | ICD-10-CM | POA: Diagnosis not present

## 2024-03-04 NOTE — Telephone Encounter (Signed)
 Requested Prescriptions  Pending Prescriptions Disp Refills   pantoprazole  (PROTONIX ) 40 MG tablet [Pharmacy Med Name: PANTOPRAZOLE  SOD DR 40 MG TAB] 180 tablet 0    Sig: TAKE 1 TABLET BY MOUTH TWICE A DAY     Gastroenterology: Proton Pump Inhibitors Passed - 03/04/2024 10:32 AM      Passed - Valid encounter within last 12 months    Recent Outpatient Visits           3 weeks ago Trigeminal neuralgia of right side of face   [facility], DO   1 month ago Other chronic sinusitis   [facility] Fend, DO   2 months ago Trigeminal neuralgia of right side of face   [facility], DO   5 months ago Chronic obstructive pulmonary disease, unspecified COPD type [facility])   [facility] Bernardo Fend, DO       Future Appointments             In 10 months Clarence Skinner, Clarence BROCKS, MD [facility] [place]

## 2024-03-10 DIAGNOSIS — G4733 Obstructive sleep apnea (adult) (pediatric): Secondary | ICD-10-CM | POA: Diagnosis not present

## 2024-03-18 ENCOUNTER — Other Ambulatory Visit: Payer: Self-pay | Admitting: Internal Medicine

## 2024-03-18 DIAGNOSIS — Z9109 Other allergy status, other than to drugs and biological substances: Secondary | ICD-10-CM

## 2024-03-19 DIAGNOSIS — R1310 Dysphagia, unspecified: Secondary | ICD-10-CM | POA: Diagnosis not present

## 2024-03-19 DIAGNOSIS — R9389 Abnormal findings on diagnostic imaging of other specified body structures: Secondary | ICD-10-CM | POA: Diagnosis not present

## 2024-03-19 DIAGNOSIS — Z7689 Persons encountering health services in other specified circumstances: Secondary | ICD-10-CM | POA: Diagnosis not present

## 2024-03-19 DIAGNOSIS — R918 Other nonspecific abnormal finding of lung field: Secondary | ICD-10-CM | POA: Diagnosis not present

## 2024-03-19 DIAGNOSIS — C099 Malignant neoplasm of tonsil, unspecified: Secondary | ICD-10-CM | POA: Diagnosis not present

## 2024-03-19 NOTE — Telephone Encounter (Signed)
 Requested Prescriptions  Refused Prescriptions Disp Refills   levocetirizine (XYZAL ) 5 MG tablet [Pharmacy Med Name: LEVOCETIRIZINE 5 MG TABLET] 90 tablet 1    Sig: TAKE 1 TABLET BY MOUTH EVERY DAY IN THE EVENING     Ear, Nose, and Throat:  Antihistamines - levocetirizine dihydrochloride  Failed - 03/19/2024  1:15 PM      Failed - Cr in normal range and within 360 days    Creatinine  Date Value Ref Range Status  10/03/2012 1.04 0.60 - 1.30 mg/dL Final   Creatinine, Ser  Date Value Ref Range Status  10/15/2022 0.78 0.61 - 1.24 mg/dL Final         Failed - eGFR is 10 or above and within 360 days    EGFR (African American)  Date Value Ref Range Status  10/03/2012 >60  Final   GFR calc Af Amer  Date Value Ref Range Status  11/29/2017 >60 >60 mL/min Final    Comment:    (NOTE) The eGFR has been calculated using the CKD EPI equation. This calculation has not been validated in all clinical situations. eGFR's persistently <60 mL/min signify possible Chronic Kidney Disease.    EGFR (Non-African Amer.)  Date Value Ref Range Status  10/03/2012 >60  Final    Comment:    eGFR values <34mL/min/1.73 m2 may be an indication of chronic kidney disease (CKD). Calculated eGFR is useful in patients with stable renal function. The eGFR calculation will not be reliable in acutely ill patients when serum creatinine is changing rapidly. It is not useful in  patients on dialysis. The eGFR calculation may not be applicable to patients at the low and high extremes of body sizes, pregnant women, and vegetarians.    GFR, Estimated  Date Value Ref Range Status  10/15/2022 >60 >60 mL/min Final    Comment:    (NOTE) Calculated using the CKD-EPI Creatinine Equation (2021)          Passed - Valid encounter within last 12 months    Recent Outpatient Visits           1 month ago Trigeminal neuralgia of right side of face   [facility]end, DO   1 month  ago Other chronic sinusitis   [facility] Fend, DO   2 months ago Trigeminal neuralgia of right side of face   [facility] Bern[facility] Fend, DO   5 months ago Chronic obstructive pulmonary disease, unspecified COPD type [facility])   [facility] Bernardo Fend, DO       Future Appointments             In 10 months Francisca, Redell BROCKS, MD [facility] [place]

## 2024-03-25 DIAGNOSIS — J984 Other disorders of lung: Secondary | ICD-10-CM | POA: Diagnosis not present

## 2024-03-25 DIAGNOSIS — C099 Malignant neoplasm of tonsil, unspecified: Secondary | ICD-10-CM | POA: Diagnosis not present

## 2024-03-31 ENCOUNTER — Telehealth: Payer: Self-pay

## 2024-03-31 NOTE — Telephone Encounter (Signed)
 Fax back to [phone]

## 2024-03-31 NOTE — Telephone Encounter (Signed)
 Copied from CRM [id number]. Topic: General - Call Back - No Documentation >> Mar 31, 2024  9:03 AM Rosaria BRAVO wrote: Reason for CRM: Arlean from [employer] called to report that she faxed over a CMN (regarding inhaler) on Friday 03/27/2024 to be signed, dated, and faxed back. Says she will re-fax this today. Corrected form from February, insurance realized it was not complete. Only thing missing is date/signature.   Best contact: [phone] ext. [phone]

## 2024-04-01 DIAGNOSIS — C099 Malignant neoplasm of tonsil, unspecified: Secondary | ICD-10-CM | POA: Diagnosis not present

## 2024-04-01 DIAGNOSIS — Z7689 Persons encountering health services in other specified circumstances: Secondary | ICD-10-CM | POA: Diagnosis not present

## 2024-04-01 DIAGNOSIS — R918 Other nonspecific abnormal finding of lung field: Secondary | ICD-10-CM | POA: Diagnosis not present

## 2024-04-01 DIAGNOSIS — R590 Localized enlarged lymph nodes: Secondary | ICD-10-CM | POA: Diagnosis not present

## 2024-04-01 NOTE — Telephone Encounter (Signed)
 Forms faxed

## 2024-04-03 DIAGNOSIS — Z419 Encounter for procedure for purposes other than remedying health state, unspecified: Secondary | ICD-10-CM | POA: Diagnosis not present

## 2024-04-10 ENCOUNTER — Other Ambulatory Visit: Payer: Self-pay | Admitting: Internal Medicine

## 2024-04-10 DIAGNOSIS — K219 Gastro-esophageal reflux disease without esophagitis: Secondary | ICD-10-CM

## 2024-04-10 NOTE — Telephone Encounter (Signed)
 Change of pharmacy  Requested Prescriptions  Pending Prescriptions Disp Refills   pantoprazole  (PROTONIX ) 40 MG tablet [Pharmacy Med Name: PANTOPRAZOLE  SOD DR 40 MG TAB] 180 tablet 0    Sig: TAKE 1 TABLET BY MOUTH EVERY DAY     Gastroenterology: Proton Pump Inhibitors Passed - 04/10/2024  2:06 PM      Passed - Valid encounter within last 12 months    Recent Outpatient Visits           2 months ago Trigeminal neuralgia of right side of face   [facility]ardo [facility]end, DO   2 months ago Other chronic sinusitis   [facility] Bern[facility] Fend, DO   3 months ago Trigeminal neuralgia of right side of face   [facility]end, DO   6 months ago Chronic obstructive pulmonary disease, unspecified COPD type [facility])   [facility] Bernardo Fend, DO       Future Appointments             In 9 months Clarence, Redell BROCKS, MD [facility] [place]

## 2024-04-20 DIAGNOSIS — Z08 Encounter for follow-up examination after completed treatment for malignant neoplasm: Secondary | ICD-10-CM | POA: Diagnosis not present

## 2024-04-20 DIAGNOSIS — Z7689 Persons encountering health services in other specified circumstances: Secondary | ICD-10-CM | POA: Diagnosis not present

## 2024-04-20 DIAGNOSIS — C099 Malignant neoplasm of tonsil, unspecified: Secondary | ICD-10-CM | POA: Diagnosis not present

## 2024-04-20 DIAGNOSIS — Z8589 Personal history of malignant neoplasm of other organs and systems: Secondary | ICD-10-CM | POA: Diagnosis not present

## 2024-04-22 DIAGNOSIS — Z9221 Personal history of antineoplastic chemotherapy: Secondary | ICD-10-CM | POA: Diagnosis not present

## 2024-04-22 DIAGNOSIS — C099 Malignant neoplasm of tonsil, unspecified: Secondary | ICD-10-CM | POA: Diagnosis not present

## 2024-04-22 DIAGNOSIS — Z8546 Personal history of malignant neoplasm of prostate: Secondary | ICD-10-CM | POA: Diagnosis not present

## 2024-04-22 DIAGNOSIS — C4492 Squamous cell carcinoma of skin, unspecified: Secondary | ICD-10-CM | POA: Diagnosis not present

## 2024-04-22 DIAGNOSIS — R1310 Dysphagia, unspecified: Secondary | ICD-10-CM | POA: Diagnosis not present

## 2024-04-22 DIAGNOSIS — Z7689 Persons encountering health services in other specified circumstances: Secondary | ICD-10-CM | POA: Diagnosis not present

## 2024-04-22 DIAGNOSIS — K117 Disturbances of salivary secretion: Secondary | ICD-10-CM | POA: Diagnosis not present

## 2024-04-22 DIAGNOSIS — F17200 Nicotine dependence, unspecified, uncomplicated: Secondary | ICD-10-CM | POA: Diagnosis not present

## 2024-04-22 DIAGNOSIS — R6884 Jaw pain: Secondary | ICD-10-CM | POA: Diagnosis not present

## 2024-04-22 DIAGNOSIS — Z923 Personal history of irradiation: Secondary | ICD-10-CM | POA: Diagnosis not present

## 2024-04-28 ENCOUNTER — Ambulatory Visit: Admitting: Internal Medicine

## 2024-04-28 ENCOUNTER — Encounter: Payer: Self-pay | Admitting: Internal Medicine

## 2024-04-28 ENCOUNTER — Other Ambulatory Visit: Payer: Self-pay

## 2024-04-28 VITALS — BP 130/80 | Ht 71.0 in

## 2024-04-28 DIAGNOSIS — M1A9XX0 Chronic gout, unspecified, without tophus (tophi): Secondary | ICD-10-CM

## 2024-04-28 DIAGNOSIS — Z23 Encounter for immunization: Secondary | ICD-10-CM | POA: Diagnosis not present

## 2024-04-28 DIAGNOSIS — E782 Mixed hyperlipidemia: Secondary | ICD-10-CM

## 2024-04-28 DIAGNOSIS — J449 Chronic obstructive pulmonary disease, unspecified: Secondary | ICD-10-CM

## 2024-04-28 DIAGNOSIS — Z7689 Persons encountering health services in other specified circumstances: Secondary | ICD-10-CM | POA: Diagnosis not present

## 2024-04-28 DIAGNOSIS — C099 Malignant neoplasm of tonsil, unspecified: Secondary | ICD-10-CM

## 2024-04-28 MED ORDER — ALLOPURINOL 300 MG PO TABS: 300.0000 mg | ORAL_TABLET | Freq: Every day | ORAL | 1 refills | Status: DC

## 2024-04-28 MED ORDER — ROSUVASTATIN CALCIUM 20 MG PO TABS: 20.0000 mg | ORAL_TABLET | Freq: Every day | ORAL | 1 refills | Status: DC

## 2024-04-28 NOTE — Progress Notes (Signed)
 Established Patient Office Visit  Subjective    Patient ID: Clarence Skinner, male    DOB: April 05, 1961  Age: 63 y.o. MRN: [id number]  CC:  Chief Complaint  Patient presents with   Medical Management of Chronic Issues    3 month recheck    HPI Clarence Skinner presents to follow up on chronic medical conditions.   Discussed the use of AI scribe software for clinical note transcription with the patient, who gave verbal consent to proceed.  History of Present Illness Clarence Skinner is a 63 year old male with hypertension and asthma who presents with dizziness and blurry vision.  He experiences dizziness when bending down and while walking, along with episodes of blurry vision. His blood pressure medications were discontinued, and his recent blood pressure reading was 130, which is within normal range. No leg swelling is present.  He manages asthma with Trelegy and has refills for albuterol  and Duaneb inhalers. He occasionally uses Lasix  for asthma-related swelling. He recently received a flu shot, and his pneumonia, tetanus, and COVID vaccines are up to date. He takes cholesterol medication, pantoprazole  for stomach issues, and baclofen  for muscle pain. He is on Losartan for pain management and has been prescribed Eliquis until December, with two bottles remaining.  Patient has a history of tonsillar squamous cell carcinoma, has an appointment with his specialist tomorrow and may require another biopsy/restart treatment.    Hypertension/OSA: -Medications: Nothing currently, had been on Amlodipine -Benazepril  5-20 but discontinued due to low pressures.  Also taking Lasix  20 mg PRN leg swelling (has to take 1-2 a year; hasn't had to take since LOV). -Checking BP at home (average): Yes; 120-130/80 without medication  -Denies any SOB, CP, vision changes, LE edema   HLD: -Medications: Crestor  20 mg -Patient is compliant with above medications and reports no side effects.  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 Referral placed, patient will call them back -Colon cancer screening: colonoscopy in 2019, repeat in 10 years  -Flu vaccine today   Outpatient Encounter Medications as of 04/28/2024  Medication Sig   albuterol  (VENTOLIN  HFA) 108 (90 Base) MCG/ACT inhaler Inhale 1-2 puffs into the lungs every 6 (six) hours as needed for wheezing or shortness of breath.   allopurinol  (ZYLOPRIM ) 300 MG tablet TAKE 1 TABLET BY MOUTH EVERY DAY   apixaban (ELIQUIS) 5 MG TABS tablet Take 1 tablet (5 mg total) by mouth 2 (two) times daily.   baclofen  (LIORESAL ) 10 MG tablet Take 1 tablet (  10 mg total) by mouth 2 (two) times daily.   fluticasone  (FLONASE ) 50 MCG/ACT nasal spray Place 2 sprays into both nostrils daily.   Fluticasone -Umeclidin-Vilant (TRELEGY ELLIPTA ) 100-62.5-25 MCG/ACT AEPB Inhale 1 puff into the lungs  daily.   furosemide  (LASIX ) 20 MG tablet Take 20 mg by mouth as needed.   ipratropium-albuterol  (DUONEB) 0.5-2.5 (3) MG/3ML SOLN Take 3 mLs by nebulization every 4 (four) hours as needed (wheezing, shortness of breath).   pantoprazole  (PROTONIX ) 40 MG tablet TAKE 1 TABLET BY MOUTH EVERY DAY   rosuvastatin  (CRESTOR ) 20 MG tablet Take 1 tablet (20 mg total) by mouth daily.   tadalafil  (CIALIS ) 20 MG tablet Take 1 tablet (20 mg total) by mouth daily.   Facility-Administered Encounter Medications as of 04/28/2024  Medication   sodium chloride  flush (NS) 0.9 % injection 10 mL    Past Medical History:  Diagnosis Date   COPD (chronic obstructive pulmonary disease) (HCC)    ED (erectile dysfunction)    GERD (gastroesophageal reflux disease)    Gout    Hypertension    Multilevel degenerative disc disease    Pneumonia 11/29/2017   Prostate cancer (HCC)    Sleep apnea    CPAP   Tachycardia    Wears dentures    partial upper and lower    Past Surgical History:  Procedure Laterality Date   ABCESS DRAINAGE  2009   tonsil   COLONOSCOPY WITH PROPOFOL  N/A 02/11/2018   Procedure: COLONOSCOPY WITH PROPOFOL ;  Surgeon: Toledo, Ladell POUR, MD;  Location: [facility];  Service: Endoscopy;  Laterality: N/A;   ESOPHAGOGASTRODUODENOSCOPY (EGD) WITH PROPOFOL  N/A 02/11/2018   Procedure: ESOPHAGOGASTRODUODENOSCOPY (EGD) WITH PROPOFOL ;  Surgeon: Toledo, Ladell POUR, MD;  Location: [facility];  Service: Endoscopy;  Laterality: N/A;   IR IMAGING GUIDED PORT INSERTION  04/09/2022   MASS EXCISION Right 08/14/2017   Procedure: EXCISION RIGHT TEMPORAL MASS SUBMENTAL MASS;  Surgeon: Milissa Hamming, MD;  Location: [facility];  Service: ENT;  Laterality: Right;  sleep apnea   RADIOACTIVE SEED IMPLANT N/A 01/26/2019   Procedure: RADIOACTIVE SEED IMPLANT/BRACHYTHERAPY IMPLANT;  Surgeon: Francisca Redell BROCKS, MD;  Location: [facility];  Service: Urology;  Laterality: N/A;   SEPTOPLASTY  2016   [place]    VOLUME STUDY N/A 12/29/2018   Procedure: VOLUME STUDY;  Surgeon: Francisca Redell BROCKS, MD;  Location: [facility];  Service: Urology;  Laterality: N/A;    Family History  Problem Relation Age of Onset   Anxiety disorder Mother    Cancer Father    Hypertension Sister    Diabetes Sister    Asthma Sister    Other Brother        mva  at age 59   Gout Brother    Hypertension Brother    Prostate cancer Maternal Uncle    Prostate cancer Maternal Uncle     Social History   Socioeconomic History   Marital status: Significant Other    Spouse name: Not on file   Number of children: Not on file   Years of education: Not on file   Highest education level: GED or equivalent  Occupational History   Not on file  Tobacco Use   Smoking status: Every Day    Current packs/day: 1.00    Average packs/day: 1 pack/day for 40.0 years (40.0 ttl pk-yrs)    Types: Cigarettes    Passive exposure: Current   Smokeless tobacco: Former    Types: Snuff, Chew    Quit date: 12/05/1984  Tobacco comments:    since age 26  Vaping Use   Vaping status: Never Used  Substance and Sexual Activity   Alcohol use: Not Currently    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   Drug use: Not Currently   Sexual activity: Yes  Other Topics Concern   Not on file  Social History Narrative   Not on file   Social Drivers of Health   Financial Resource Strain: Patient Declined (01/22/2024)   Overall Financial Resource Strain (CARDIA)    Difficulty of Paying Living Expenses: Patient declined  Food Insecurity: Unknown (01/22/2024)   Hunger Vital Sign    Worried About Running Out of Food in the Last Year: Not on file    Ran Out of Food in the Last Year: Patient declined  Transportation Needs: Patient Declined (01/22/2024)   PRAPARE - Administrator, Civil Service (Medical): Patient declined    Lack of Transportation (Non-Medical): Patient declined  Physical Activity: Inactive (01/22/2024)   Exercise Vital  Sign    Days of Exercise per Week: 0 days    Minutes of Exercise per Session: Not on file  Stress: No Stress Concern Present (01/22/2024)   [employer] of Occupational Health - Occupational Stress Questionnaire    Feeling of Stress: Only a little  Social Connections: Unknown (01/22/2024)   Social Connection and Isolation Panel    Frequency of Communication with Friends and Family: Once a week    Frequency of Social Gatherings with Friends and Family: Patient declined    Attends Religious Services: Patient declined    Database administrator or Organizations: No    Attends Engineer, structural: Not on file    Marital Status: Patient declined  Intimate Partner Violence: Not At Risk (04/03/2022)   Humiliation, Afraid, Rape, and Kick questionnaire    Fear of Current or Ex-Partner: No    Emotionally Abused: No    Physically Abused: No    Sexually Abused: No    Review of Systems  Constitutional:  Negative for chills and fever.  Respiratory:  Negative for cough, shortness of breath and wheezing.   Cardiovascular:  Negative for chest pain.  Gastrointestinal:  Negative for abdominal pain.  Neurological:  Positive for dizziness.      Objective    BP 130/80 (Cuff Size: Normal)   Ht 5' 11 (1.803 m)   BMI 21.83 kg/m   Physical Exam Constitutional:      Appearance: Normal appearance.  HENT:     Head: Normocephalic and atraumatic.  Eyes:     Conjunctiva/sclera: Conjunctivae normal.  Cardiovascular:     Rate and Rhythm: Normal rate and regular rhythm.  Pulmonary:     Effort: Pulmonary effort is normal.     Breath sounds: Normal breath sounds.  Skin:    General: Skin is warm and dry.  Neurological:     General: No focal deficit present.     Mental Status: He is alert. Mental status is at baseline.  Psychiatric:        Mood and Affect: Mood normal.        Behavior: Behavior normal.     Last CBC Lab Results  Component Value Date   WBC 6.2 10/15/2022   HGB 12.8  (L) 10/15/2022   HCT 39.7 10/15/2022   MCV 79.6 (L) 10/15/2022   MCH 25.7 (L) 10/15/2022   RDW 15.6 (H) 10/15/2022   PLT 309 10/15/2022   Last metabolic panel Lab  Results  Component Value Date   GLUCOSE 117 (H) 10/15/2022   NA 132 (L) 10/15/2022   K 3.8 10/15/2022   CL 100 10/15/2022   CO2 24 10/15/2022   BUN 14 10/15/2022   CREATININE 0.78 10/15/2022   GFRNONAA >60 10/15/2022   CALCIUM  9.0 10/15/2022   PROT 7.7 10/15/2022   ALBUMIN 4.4 10/15/2022   BILITOT 0.3 10/15/2022   ALKPHOS 70 10/15/2022   AST 18 10/15/2022   ALT 31 10/15/2022   ANIONGAP 8 10/15/2022   Last lipids Lab Results  Component Value Date   CHOL 95 02/07/2024   HDL 54 02/07/2024   LDLCALC 29 02/07/2024   TRIG 50 02/07/2024   CHOLHDL 1.8 02/07/2024   Last hemoglobin A1c Lab Results  Component Value Date   HGBA1C 5.6 02/07/2024   Last thyroid  functions Lab Results  Component Value Date   TSH 0.978 07/05/2022   Last vitamin D  Lab Results  Component Value Date   VD25OH 125 (H) 02/07/2024   Last vitamin B12 and Folate Lab Results  Component Value Date   VITAMINB12 610 02/07/2024        Assessment & Plan:   Assessment & Plan Tonsillar Squamous Cell Carcinoma Scheduled appointment tomorrow to discuss potential restart of chemotherapy and radiation will lead to immunocompromised state, increasing infection risk. - Provide information on RSV vaccine. - Advise to obtain RSV vaccine from pharmacy if treatment starts.  Hyperlipidemia Cholesterol levels well-controlled with current medication. - Send prescription for cholesterol medication to pharmacy.  Gout Stable, no recent flares.  - Send prescription for allopurinol  to pharmacy.  COPD Managed with Trelegy inhaler. Albuterol  and Duaneb inhalers available with refills. - Ensure inhalers are up to date and refilled as needed.  - rosuvastatin  (CRESTOR ) 20 MG tablet; Take 1 tablet (20 mg total) by mouth daily.  Dispense: 90 tablet;  Refill: 1 - allopurinol  (ZYLOPRIM ) 300 MG tablet; Take 1 tablet (300 mg total) by mouth daily.  Dispense: 90 tablet; Refill: 1 - Flu vaccine trivalent PF, 6mos and older(Flulaval,Afluria,Fluarix,Fluzone)   Return in about 4 months (around 08/29/2024).   [name], DO

## 2024-04-28 NOTE — Patient Instructions (Signed)
 Respiratory Syncytial Virus (RSV) Vaccine Injection What is this medication? RESPIRATORY SYNCYTIAL VIRUS VACCINE (reh SPIR uh tor ee sin SISH uhl VY rus vak SEEN) reduces the risk of respiratory syncytial virus (RSV). It does not treat RSV. It is still possible to get RSV after receiving this vaccine, but the symptoms may be less severe or not last as long. It works by helping your immune system learn how to fight off a future infection. This medicine may be used for other purposes; ask your health care provider or pharmacist if you have questions. COMMON BRAND NAME(S): ABRYSVO, AREXVY, mRESVIA What should I tell my care team before I take this medication? They need to know if you have any of these conditions: Immune system problems An unusual or allergic reaction to respiratory syncytial virus vaccine, other medications, foods, dyes, or preservatives Pregnant or trying to get pregnant Breastfeeding How should I use this medication? This vaccine is injected into a muscle. It is given by your care team. A copy of Vaccine Information Statements will be given before each vaccination. Be sure to read this information carefully each time. This sheet may change often. A copy of Vaccine Information Statements will be given before each vaccination. Be sure to read this information carefully each time. This sheet may change often. Talk to your care team about the use of this vaccine in children. It is not approved for use in children. Overdosage: If you think you have taken too much of this medicine contact a poison control center or emergency room at once. NOTE: This medicine is only for you. Do not share this medicine with others. What if I miss a dose? This does not apply. What may interact with this medication? Medications that lower your chance of fighting infection This list may not describe all possible interactions. Give your health care provider a list of all the medicines, herbs,  non-prescription drugs, or dietary supplements you use. Also tell them if you smoke, drink alcohol, or use illegal drugs. Some items may interact with your medicine. What should I watch for while using this medication? Visit your care team for regular health checks. Before you receive this vaccine, talk to your care team if you have an acute illness. Vaccines can be given to people with mild acute illness, such as the common cold or diarrhea. Discuss with your care team the risks and benefits of receiving this vaccine during a moderate to severe illness. Your care team may choose to wait to give you the vaccine when you feel better. Report any side effects to your care team or to the Vaccine Adverse Event Reporting System (VAERS) website at https://vaers.LAgents.no. This is only for reporting side effects; VAERs staff do not give medical advice. What side effects may I notice from receiving this medication? Side effects that you should report to your care team as soon as possible: Allergic reactions--skin rash, itching, hives, swelling of the face, lips, tongue, or throat Feeling faint or lightheaded Weakness and tingling in the feet and legs that spreads to upper body Side effects that usually do not require medical attention (report these to your care team if they continue or are bothersome): General discomfort and fatigue Headache Muscle pain Pain, redness, or irritation at injection site This list may not describe all possible side effects. Call your doctor for medical advice about side effects. You may report side effects to FDA at 1-800-FDA-1088. Where should I keep my medication? This vaccine is only given by your care  team. It will not be stored at home. NOTE: This sheet is a summary. It may not cover all possible information. If you have questions about this medicine, talk to your doctor, pharmacist, or health care provider.  2025 Elsevier/Gold Standard (2023-07-31 00:00:00)

## 2024-04-29 DIAGNOSIS — C099 Malignant neoplasm of tonsil, unspecified: Secondary | ICD-10-CM | POA: Diagnosis not present

## 2024-04-29 DIAGNOSIS — C7989 Secondary malignant neoplasm of other specified sites: Secondary | ICD-10-CM | POA: Diagnosis not present

## 2024-04-29 DIAGNOSIS — Z79899 Other long term (current) drug therapy: Secondary | ICD-10-CM | POA: Diagnosis not present

## 2024-04-29 DIAGNOSIS — Z79891 Long term (current) use of opiate analgesic: Secondary | ICD-10-CM | POA: Diagnosis not present

## 2024-04-29 DIAGNOSIS — Z7689 Persons encountering health services in other specified circumstances: Secondary | ICD-10-CM | POA: Diagnosis not present

## 2024-04-29 DIAGNOSIS — C090 Malignant neoplasm of tonsillar fossa: Secondary | ICD-10-CM | POA: Diagnosis not present

## 2024-04-29 DIAGNOSIS — G893 Neoplasm related pain (acute) (chronic): Secondary | ICD-10-CM | POA: Diagnosis not present

## 2024-04-29 DIAGNOSIS — I10 Essential (primary) hypertension: Secondary | ICD-10-CM | POA: Diagnosis not present

## 2024-04-29 DIAGNOSIS — Z923 Personal history of irradiation: Secondary | ICD-10-CM | POA: Diagnosis not present

## 2024-04-29 DIAGNOSIS — R221 Localized swelling, mass and lump, neck: Secondary | ICD-10-CM | POA: Diagnosis not present

## 2024-04-29 DIAGNOSIS — F17210 Nicotine dependence, cigarettes, uncomplicated: Secondary | ICD-10-CM | POA: Diagnosis not present

## 2024-04-29 DIAGNOSIS — J449 Chronic obstructive pulmonary disease, unspecified: Secondary | ICD-10-CM | POA: Diagnosis not present

## 2024-05-12 DIAGNOSIS — Z7689 Persons encountering health services in other specified circumstances: Secondary | ICD-10-CM | POA: Diagnosis not present

## 2024-05-30 ENCOUNTER — Other Ambulatory Visit: Payer: Self-pay | Admitting: Internal Medicine

## 2024-05-30 DIAGNOSIS — K219 Gastro-esophageal reflux disease without esophagitis: Secondary | ICD-10-CM

## 2024-06-01 NOTE — Telephone Encounter (Signed)
 Too soon for refill.  Requested Prescriptions  Pending Prescriptions Disp Refills   pantoprazole  (PROTONIX ) 40 MG tablet [Pharmacy Med Name: PANTOPRAZOLE  SOD DR 40 MG TAB] 180 tablet 0    Sig: TAKE 1 TABLET BY MOUTH TWICE A DAY     Gastroenterology: Proton Pump Inhibitors Passed - 06/01/2024  1:48 PM      Passed - Valid encounter within last 12 months    Recent Outpatient Visits           1 month ago Chronic obstructive pulmonary disease, unspecified COPD type [facility])   [facility] Bernardo Fend, DO   3 months ago Trigeminal neuralgia of right side of face   [facility] Bernardo Fend, DO   4 months ago Other chronic sinusitis   [facility] Bernardo Fend, DO   5 months ago Trigeminal neuralgia of right side of face   [facility] Bernardo Fend, DO   8 months ago Chronic obstructive pulmonary disease, unspecified COPD type [facility])   [place] [facility] Bernardo Fend, DO       Future Appointments             In 8 months Clarence Skinner, Clarence BROCKS, MD [facility] [place]

## 2024-06-02 DIAGNOSIS — I10 Essential (primary) hypertension: Secondary | ICD-10-CM | POA: Diagnosis not present

## 2024-06-02 DIAGNOSIS — J449 Chronic obstructive pulmonary disease, unspecified: Secondary | ICD-10-CM | POA: Diagnosis not present

## 2024-06-02 DIAGNOSIS — H9190 Unspecified hearing loss, unspecified ear: Secondary | ICD-10-CM | POA: Diagnosis not present

## 2024-06-02 DIAGNOSIS — Z5112 Encounter for antineoplastic immunotherapy: Secondary | ICD-10-CM | POA: Diagnosis not present

## 2024-06-02 DIAGNOSIS — Z8669 Personal history of other diseases of the nervous system and sense organs: Secondary | ICD-10-CM | POA: Diagnosis not present

## 2024-06-02 DIAGNOSIS — Z5111 Encounter for antineoplastic chemotherapy: Secondary | ICD-10-CM | POA: Diagnosis not present

## 2024-06-02 DIAGNOSIS — C099 Malignant neoplasm of tonsil, unspecified: Secondary | ICD-10-CM | POA: Diagnosis not present

## 2024-06-02 DIAGNOSIS — F17210 Nicotine dependence, cigarettes, uncomplicated: Secondary | ICD-10-CM | POA: Diagnosis not present

## 2024-06-02 DIAGNOSIS — G4730 Sleep apnea, unspecified: Secondary | ICD-10-CM | POA: Diagnosis not present

## 2024-06-02 DIAGNOSIS — Z809 Family history of malignant neoplasm, unspecified: Secondary | ICD-10-CM | POA: Diagnosis not present

## 2024-06-02 DIAGNOSIS — Z7689 Persons encountering health services in other specified circumstances: Secondary | ICD-10-CM | POA: Diagnosis not present

## 2024-06-10 DIAGNOSIS — Z7689 Persons encountering health services in other specified circumstances: Secondary | ICD-10-CM | POA: Diagnosis not present

## 2024-06-10 DIAGNOSIS — R911 Solitary pulmonary nodule: Secondary | ICD-10-CM | POA: Diagnosis not present

## 2024-06-10 DIAGNOSIS — C099 Malignant neoplasm of tonsil, unspecified: Secondary | ICD-10-CM | POA: Diagnosis not present

## 2024-06-10 DIAGNOSIS — R1310 Dysphagia, unspecified: Secondary | ICD-10-CM | POA: Diagnosis not present

## 2024-06-10 DIAGNOSIS — I6523 Occlusion and stenosis of bilateral carotid arteries: Secondary | ICD-10-CM | POA: Diagnosis not present

## 2024-06-10 DIAGNOSIS — M799 Soft tissue disorder, unspecified: Secondary | ICD-10-CM | POA: Diagnosis not present

## 2024-06-10 DIAGNOSIS — J432 Centrilobular emphysema: Secondary | ICD-10-CM | POA: Diagnosis not present

## 2024-06-15 DIAGNOSIS — C099 Malignant neoplasm of tonsil, unspecified: Secondary | ICD-10-CM | POA: Diagnosis not present

## 2024-06-23 DIAGNOSIS — Z7689 Persons encountering health services in other specified circumstances: Secondary | ICD-10-CM | POA: Diagnosis not present

## 2024-06-24 DIAGNOSIS — I82C11 Acute embolism and thrombosis of right internal jugular vein: Secondary | ICD-10-CM | POA: Diagnosis not present

## 2024-06-24 DIAGNOSIS — Z7689 Persons encountering health services in other specified circumstances: Secondary | ICD-10-CM | POA: Diagnosis not present

## 2024-07-01 DIAGNOSIS — C099 Malignant neoplasm of tonsil, unspecified: Secondary | ICD-10-CM | POA: Diagnosis not present

## 2024-07-08 DIAGNOSIS — G4733 Obstructive sleep apnea (adult) (pediatric): Secondary | ICD-10-CM | POA: Diagnosis not present

## 2024-07-08 DIAGNOSIS — J449 Chronic obstructive pulmonary disease, unspecified: Secondary | ICD-10-CM | POA: Diagnosis not present

## 2024-07-21 DIAGNOSIS — C099 Malignant neoplasm of tonsil, unspecified: Secondary | ICD-10-CM | POA: Diagnosis not present

## 2024-07-31 ENCOUNTER — Ambulatory Visit: Admitting: Internal Medicine

## 2024-07-31 ENCOUNTER — Encounter: Payer: Self-pay | Admitting: Internal Medicine

## 2024-07-31 ENCOUNTER — Other Ambulatory Visit: Payer: Self-pay

## 2024-07-31 VITALS — BP 126/80 | HR 100 | Temp 98.2°F | Resp 18 | Ht 71.0 in | Wt 128.2 lb

## 2024-07-31 DIAGNOSIS — E876 Hypokalemia: Secondary | ICD-10-CM | POA: Diagnosis not present

## 2024-07-31 DIAGNOSIS — G629 Polyneuropathy, unspecified: Secondary | ICD-10-CM | POA: Diagnosis not present

## 2024-07-31 DIAGNOSIS — D649 Anemia, unspecified: Secondary | ICD-10-CM | POA: Diagnosis not present

## 2024-07-31 NOTE — Progress Notes (Signed)
 "  Acute Office Visit  Subjective:     Patient ID: Clarence Skinner, male    DOB: 1960/08/28, 64 y.o.   MRN: [id number]  Chief Complaint  Patient presents with   Knee Pain    right   Foot Pain    right    HPI Patient is in today for joint pain.   Discussed the use of AI scribe software for clinical note transcription with the patient, who gave verbal consent to proceed.  History of Present Illness Clarence Skinner is a 64 year old male with cancer who presents with joint pain and numbness.  He has right-sided joint pain with numbness in his fingers and toes extending to the heel. Pain started in the ankle, progressed to the knee, feels burning, and worsens with movement such as pointing the toe downward, which causes a pulling sensation.  He has lost about 25 pounds despite increased food intake and is worried about this.  He has tried Tylenol  and [place]a without relief. He has not used ibuprofen  or Aleve. He is taking gabapentin  but finds 300 mg too sedating.  He was previously told he had low hemoglobin and low potassium. He has been eating bananas but has not received potassium pills or any vitamins despite concern for deficiencies.  He takes allopurinol  for gout and has used colchicine in the past, but he does not think his current symptoms are due to gout.   Review of Systems  Musculoskeletal:  Positive for joint pain.  Skin: Negative.   Neurological:  Positive for tingling. Negative for weakness.        Objective:    BP 126/80 (Cuff Size: Normal)   Pulse 100   Temp 98.2 F (36.8 C) (Oral)   Resp 18   Ht 5' 11 (1.803 m)   Wt 128 lb 3.2 oz (58.2 kg)   SpO2 97%   BMI 17.88 kg/m  BP Readings from Last 3 Encounters:  07/31/24 126/80  04/28/24 130/80  02/07/24 118/72   Wt Readings from Last 3 Encounters:  07/31/24 128 lb 3.2 oz (58.2 kg)  02/07/24 156 lb 8 oz (71 kg)  01/28/24 157 lb 6.4 oz (71.4 kg)      Physical Exam Constitutional:      Appearance:  Normal appearance.  HENT:     Head: Normocephalic and atraumatic.  Eyes:     Conjunctiva/sclera: Conjunctivae normal.  Cardiovascular:     Rate and Rhythm: Normal rate and regular rhythm.  Pulmonary:     Effort: Pulmonary effort is normal.     Breath sounds: Normal breath sounds.  Musculoskeletal:     Right lower leg: No edema.     Left lower leg: No edema.  Skin:    General: Skin is warm and dry.     Comments: No erythema or swelling in right knee, ankle or foot  Neurological:     Mental Status: He is alert. Mental status is at baseline.  Psychiatric:        Mood and Affect: Mood normal.        Behavior: Behavior normal.     No results found for any visits on 07/31/24.      Assessment & Plan:   Assessment & Plan Polyneuropathy Burning, numbness, and tingling suggest neuropathy, possibly related to cancer treatment affecting vitamin metabolism. Atypical for gout. Gabapentin  ineffective at current dosage. - Ordered labs for vitamin levels and uric acid. - Advised 200 mg gabapentin  at bedtime. - Consider nerve conduction study  if labs normal. - Discuss symptoms with oncologist on January 26th.  Anemia Previous labs showed low hemoglobin. - Repeated hemoglobin levels in labs.  Hypokalemia Previous labs showed low potassium. Increased dietary intake. - Repeated potassium levels in labs.  - CBC w/Diff/Platelet - Ferritin - Comprehensive Metabolic Panel (CMET) - Vitamin B12 - Vitamin B1 - Uric acid  Return in about 4 weeks (around 08/28/2024).  Clarence Fischer, DO   "

## 2024-08-01 ENCOUNTER — Encounter: Payer: Self-pay | Admitting: Oncology

## 2024-08-03 ENCOUNTER — Ambulatory Visit: Payer: Self-pay | Admitting: Internal Medicine

## 2024-08-04 ENCOUNTER — Encounter: Payer: Self-pay | Admitting: Oncology

## 2024-08-04 LAB — URIC ACID: Uric Acid, Serum: 2.3 mg/dL — ABNORMAL LOW (ref 4.0–8.0)

## 2024-08-04 LAB — COMPREHENSIVE METABOLIC PANEL WITH GFR
AG Ratio: 2 (calc) (ref 1.0–2.5)
ALT: 16 U/L (ref 9–46)
AST: 13 U/L (ref 10–35)
Albumin: 4.5 g/dL (ref 3.6–5.1)
Alkaline phosphatase (APISO): 59 U/L (ref 35–144)
BUN/Creatinine Ratio: 15 (calc) (ref 6–22)
BUN: 9 mg/dL (ref 7–25)
CO2: 29 mmol/L (ref 20–32)
Calcium: 9.2 mg/dL (ref 8.6–10.3)
Chloride: 98 mmol/L (ref 98–110)
Creat: 0.62 mg/dL — ABNORMAL LOW (ref 0.70–1.35)
Globulin: 2.2 g/dL (ref 1.9–3.7)
Glucose, Bld: 142 mg/dL — ABNORMAL HIGH (ref 65–99)
Potassium: 4.2 mmol/L (ref 3.5–5.3)
Sodium: 135 mmol/L (ref 135–146)
Total Bilirubin: 0.3 mg/dL (ref 0.2–1.2)
Total Protein: 6.7 g/dL (ref 6.1–8.1)
eGFR: 109 mL/min/1.73m2

## 2024-08-04 LAB — CBC WITH DIFFERENTIAL/PLATELET
Absolute Lymphocytes: 336 {cells}/uL — ABNORMAL LOW (ref 850–3900)
Absolute Monocytes: 90 {cells}/uL — ABNORMAL LOW (ref 200–950)
Basophils Absolute: 20 {cells}/uL (ref 0–200)
Basophils Relative: 0.7 %
Eosinophils Absolute: 0 {cells}/uL — ABNORMAL LOW (ref 15–500)
Eosinophils Relative: 0 %
HCT: 28.5 % — ABNORMAL LOW (ref 39.4–51.1)
Hemoglobin: 8.8 g/dL — ABNORMAL LOW (ref 13.2–17.1)
MCH: 25.6 pg — ABNORMAL LOW (ref 27.0–33.0)
MCHC: 30.9 g/dL — ABNORMAL LOW (ref 31.6–35.4)
MCV: 82.8 fL (ref 81.4–101.7)
MPV: 11 fL (ref 7.5–12.5)
Monocytes Relative: 3.1 %
Neutro Abs: 2453 {cells}/uL (ref 1500–7800)
Neutrophils Relative %: 84.6 %
Platelets: 241 Thousand/uL (ref 140–400)
RBC: 3.44 Million/uL — ABNORMAL LOW (ref 4.20–5.80)
RDW: 18.9 % — ABNORMAL HIGH (ref 11.0–15.0)
Total Lymphocyte: 11.6 %
WBC: 2.9 Thousand/uL — ABNORMAL LOW (ref 3.8–10.8)

## 2024-08-04 LAB — FERRITIN: Ferritin: 521 ng/mL — ABNORMAL HIGH (ref 24–380)

## 2024-08-04 LAB — VITAMIN B1: Vitamin B1 (Thiamine): 10 nmol/L (ref 8–30)

## 2024-08-04 LAB — VITAMIN B12: Vitamin B-12: 379 pg/mL (ref 200–1100)

## 2024-08-05 ENCOUNTER — Telehealth: Payer: Self-pay | Admitting: Internal Medicine

## 2024-08-05 NOTE — Telephone Encounter (Signed)
 Copied from CRM [id number]. Topic: General - Other >> Aug 05, 2024 12:01 PM Hadassah PARAS wrote: Reason for CRM: Pt returning missed call from Jerona Fees, [place]. Please call pt back on #[phone]

## 2024-08-05 NOTE — Telephone Encounter (Signed)
 Left patient a message to call Patricio) office back

## 2024-08-06 ENCOUNTER — Encounter: Payer: Self-pay | Admitting: Internal Medicine

## 2024-08-06 ENCOUNTER — Ambulatory Visit: Admitting: Internal Medicine

## 2024-08-06 ENCOUNTER — Other Ambulatory Visit: Payer: Self-pay

## 2024-08-06 VITALS — BP 130/72 | HR 104 | Temp 98.1°F | Resp 16 | Ht 71.0 in | Wt 128.2 lb

## 2024-08-06 DIAGNOSIS — D649 Anemia, unspecified: Secondary | ICD-10-CM

## 2024-08-06 DIAGNOSIS — D72819 Decreased white blood cell count, unspecified: Secondary | ICD-10-CM

## 2024-08-06 DIAGNOSIS — R7309 Other abnormal glucose: Secondary | ICD-10-CM

## 2024-08-06 DIAGNOSIS — K5900 Constipation, unspecified: Secondary | ICD-10-CM

## 2024-08-06 LAB — POCT GLYCOSYLATED HEMOGLOBIN (HGB A1C): Hemoglobin A1C: 5.2 % (ref 4.0–5.6)

## 2024-08-06 NOTE — Progress Notes (Signed)
 "  Acute Office Visit  Subjective:     Patient ID: Clarence Skinner, male    DOB: 19-Jul-1961, 64 y.o.   MRN: [id number]  Chief Complaint  Patient presents with   Follow-up    labs    HPI Patient is in today for follow up on labs.   Discussed the use of AI scribe software for clinical note transcription with the patient, who gave verbal consent to proceed.  History of Present Illness  Clarence Skinner is a 64 year old male with primary tonsillar squamous cell carcinoma who presents with concerns about his blood work.  He is worried about a drop in hemoglobin from 12.8 in March 2024 to 8.8 currently in the setting of ongoing cancer treatment. He is taking steroids for swelling and is reassured that his A1c is 5.2.  He has dark stools and infrequent bowel movements, with the last bowel movement three days ago. He has no abdominal pain or change in stool consistency and is not taking iron. He uses Miralax for constipation.  He is taking Airborne (vitamin C), drinking Gatorade and water , and using nutrition shakes. Recent labs also show a low white blood cell count.   Review of Systems  Gastrointestinal:  Positive for constipation. Negative for abdominal pain and blood in stool.  Genitourinary:  Negative for hematuria.  Musculoskeletal:  Positive for joint pain.  Neurological:  Positive for tingling. Negative for weakness.        Objective:    BP 130/72   Pulse (!) 104   Temp 98.1 F (36.7 C) (Oral)   Resp 16   Ht 5' 11 (1.803 m)   Wt 128 lb 3.2 oz (58.2 kg)   SpO2 94%   BMI 17.88 kg/m  BP Readings from Last 3 Encounters:  08/06/24 130/72  07/31/24 126/80  04/28/24 130/80   Wt Readings from Last 3 Encounters:  08/06/24 128 lb 3.2 oz (58.2 kg)  07/31/24 128 lb 3.2 oz (58.2 kg)  02/07/24 156 lb 8 oz (71 kg)      Physical Exam Constitutional:      Appearance: Normal appearance.  HENT:     Head: Normocephalic and atraumatic.  Eyes:     Conjunctiva/sclera:  Conjunctivae normal.  Cardiovascular:     Rate and Rhythm: Normal rate and regular rhythm.  Pulmonary:     Effort: Pulmonary effort is normal.     Breath sounds: Normal breath sounds.  Skin:    General: Skin is warm and dry.  Neurological:     Mental Status: He is alert. Mental status is at baseline.  Psychiatric:        Mood and Affect: Mood normal.        Behavior: Behavior normal.     No results found for any visits on 08/06/24.      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 Differential includes potential bleeding; further investigation needed for dark stools. - Sent home with stool test kit for blood. - Contacted oncologist to discuss lab results and need for further evaluation. - Scheduled follow-up in two weeks for lab recheck.  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 - Continue Airborne (vitamin C) for immune support.  Constipation Difficulty with bowel movements, using Miralax. Dark stools possibly related to constipation or diet. - Continue Miralax as needed. - Monitor bowel movements and use stool test  kit for blood.  Elevated Blood Sugars - BS 147 on recent labs, patient on steroids. A1c here today 5.2%.  - POCT HgB A1C  Return in about 2 weeks (around 08/20/2024).  Clarence Fischer, DO   "

## 2024-08-10 ENCOUNTER — Other Ambulatory Visit: Payer: Self-pay | Admitting: Internal Medicine

## 2024-08-10 ENCOUNTER — Encounter: Payer: Self-pay | Admitting: Internal Medicine

## 2024-08-27 ENCOUNTER — Ambulatory Visit (INDEPENDENT_AMBULATORY_CARE_PROVIDER_SITE_OTHER): Admitting: Internal Medicine

## 2024-08-27 ENCOUNTER — Encounter: Payer: Self-pay | Admitting: Internal Medicine

## 2024-08-27 ENCOUNTER — Other Ambulatory Visit: Payer: Self-pay

## 2024-08-27 VITALS — BP 122/74 | HR 100 | Temp 97.9°F | Resp 16 | Ht 71.0 in | Wt 126.0 lb

## 2024-08-27 DIAGNOSIS — M1A9XX0 Chronic gout, unspecified, without tophus (tophi): Secondary | ICD-10-CM

## 2024-08-27 DIAGNOSIS — E782 Mixed hyperlipidemia: Secondary | ICD-10-CM

## 2024-08-27 DIAGNOSIS — J449 Chronic obstructive pulmonary disease, unspecified: Secondary | ICD-10-CM

## 2024-08-27 DIAGNOSIS — K219 Gastro-esophageal reflux disease without esophagitis: Secondary | ICD-10-CM

## 2024-08-27 DIAGNOSIS — D649 Anemia, unspecified: Secondary | ICD-10-CM

## 2024-08-27 LAB — HEMOCCULT GUIAC POC 1CARD (OFFICE)
Card #2 Fecal Occult Blod, POC: NEGATIVE
Card #3 Fecal Occult Blood, POC: NEGATIVE
Fecal Occult Blood, POC: NEGATIVE

## 2024-08-27 MED ORDER — ROSUVASTATIN CALCIUM 20 MG PO TABS: 20.0000 mg | ORAL_TABLET | Freq: Every day | ORAL | 1 refills | Status: AC

## 2024-08-27 MED ORDER — ALBUTEROL SULFATE HFA 108 (90 BASE) MCG/ACT IN AERS: 1.0000 | INHALATION_SPRAY | Freq: Four times a day (QID) | RESPIRATORY_TRACT | 3 refills | Status: AC | PRN

## 2024-08-27 MED ORDER — PANTOPRAZOLE SODIUM 40 MG PO TBEC: 40.0000 mg | DELAYED_RELEASE_TABLET | Freq: Every day | ORAL | 1 refills | Status: AC

## 2024-08-27 MED ORDER — TRELEGY ELLIPTA 100-62.5-25 MCG/ACT IN AEPB: 1.0000 | INHALATION_SPRAY | Freq: Every day | RESPIRATORY_TRACT | 11 refills | Status: AC

## 2024-08-27 MED ORDER — ALLOPURINOL 300 MG PO TABS: 300.0000 mg | ORAL_TABLET | Freq: Every day | ORAL | 1 refills | Status: AC

## 2024-08-27 NOTE — Progress Notes (Signed)
 "  Established Patient Office Visit  Subjective    Patient ID: Clarence Skinner, male    DOB: [birth year]  Age: 64 y.o. MRN: [id number]  CC:  Chief Complaint  Patient presents with   Medical Management of Chronic Issues    HPI Clarence Skinner presents to follow up on chronic medical conditions.   Discussed the use of AI scribe software for clinical note transcription with the patient, who gave verbal consent to proceed.  History of Present Illness Clarence Skinner is a 64 year old male with anemia and cancer who presents for medication refills and follow-up on lab results.  His hemoglobin has improved from 8.8 to 10.1. He still has fatigue, shortness of breath, and occasional dizziness, but stool tests were negative for blood.  He is receiving chemotherapy every three weeks with a planned change to a less intense regimen every six to eight weeks. He has neuropathy and hot flashes. He reports prior prostate problems that were more severe than his current symptoms.  He uses Trelegy for respiratory issues and has intermittent sinus congestion and post-nasal drip, which he treats with saline rinses. He has a nebulizer for as-needed use and uses an albuterol  inhaler as rescue.  He takes pantoprazole  40 mg twice daily for reflux, with heartburn if he misses doses. He also takes medication for gout and cholesterol and has used prednisone in the past for sinus issues.  His sinus problems cause facial swelling and reduced appetite, partially relieved by saline rinses.   Hypertension/OSA: -Medications: Nothing currently, had been on Amlodipine -Benazepril  5-20 but discontinued due to low pressures.  Also taking Lasix 20 mg PRN leg swelling (has to take 1-2 a year; hasn't had to take since LOV). -Checking BP at home (average): Yes; 120-130/80 without medication  -Denies any SOB, CP, vision changes, LE edema   HLD: -Medications: Crestor  20 mg -Patient is compliant with above medications and reports  no side effects.  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 Referral placed, patient will call them back -Colon cancer screening: colonoscopy in 2019, repeat in 10 years    Outpatient Encounter Medications as of 08/27/2024  Medication Sig   albuterol  (VENTOLIN  HFA) 108 (90 Base) MCG/ACT inhaler Inhale 1-2 puffs into the lungs every 6 (six) hours as needed for wheezing or shortness of breath.   allopurinol  (ZYLOPRIM ) 300 MG tablet Take 1 tablet (300 mg total) by mouth daily.  apixaban (ELIQUIS) 5 MG TABS tablet Take 1 tablet (5 mg total) by mouth 2 (two) times daily.   fluticasone  (FLONASE ) 50 MCG/ACT nasal spray Place 2 sprays into both nostrils daily.   Fluticasone -Umeclidin-Vilant (TRELEGY ELLIPTA ) 100-62.5-25 MCG/ACT AEPB Inhale 1 puff into the lungs  daily.   furosemide (LASIX) 20 MG tablet Take 20 mg by mouth as needed.   ipratropium-albuterol  (DUONEB) 0.5-2.5 (3) MG/3ML SOLN Take 3 mLs by nebulization every 4 (four) hours as needed (wheezing, shortness of breath).   pantoprazole  (PROTONIX ) 40 MG tablet TAKE 1 TABLET BY MOUTH EVERY DAY   rosuvastatin  (CRESTOR ) 20 MG tablet Take 1 tablet (20 mg total) by mouth daily.   tadalafil  (CIALIS ) 20 MG tablet Take 1 tablet (20 mg total) by mouth daily.   Facility-Administered Encounter Medications as of 08/27/2024  Medication   sodium chloride  flush (NS) 0.9 % injection 10 mL    Past Medical History:  Diagnosis Date   COPD (chronic obstructive pulmonary disease) (HCC)    ED (erectile dysfunction)    GERD (gastroesophageal reflux disease)    Gout    Hypertension    Multilevel degenerative disc disease    Pneumonia 11/29/2017   Prostate cancer (HCC)    Sleep apnea    CPAP   Tachycardia    Wears dentures    partial upper and lower    Past Surgical History:  Procedure Laterality Date   ABCESS DRAINAGE  2009   tonsil   COLONOSCOPY WITH PROPOFOL  N/A 02/11/2018   Procedure: COLONOSCOPY WITH PROPOFOL ;  Surgeon: Toledo, Ladell POUR, MD;  Location: [facility];  Service: Endoscopy;  Laterality: N/A;   ESOPHAGOGASTRODUODENOSCOPY (EGD) WITH PROPOFOL  N/A 02/11/2018   Procedure: ESOPHAGOGASTRODUODENOSCOPY (EGD) WITH PROPOFOL ;  Surgeon: Toledo, Ladell POUR, MD;  Location: [facility];  Service: Endoscopy;  Laterality: N/A;   IR IMAGING GUIDED PORT INSERTION  04/09/2022   MASS EXCISION Right 08/14/2017   Procedure: EXCISION RIGHT TEMPORAL MASS SUBMENTAL MASS;  Surgeon: Milissa Hamming, MD;  Location: [facility];  Service: ENT;  Laterality: Right;  sleep apnea   RADIOACTIVE SEED IMPLANT N/A 01/26/2019   Procedure: RADIOACTIVE SEED IMPLANT/BRACHYTHERAPY IMPLANT;  Surgeon: Francisca Redell BROCKS, MD;  Location: [facility];  Service: Urology;  Laterality: N/A;   SEPTOPLASTY  2016   [place]    VOLUME STUDY N/A 12/29/2018   Procedure: VOLUME STUDY;  Surgeon: Francisca Redell BROCKS, MD;  Location: [facility];  Service: Urology;  Laterality: N/A;    Family History  Problem Relation Age of Onset   Anxiety disorder Mother    Cancer Father    Hypertension Sister    Diabetes Sister    Asthma Sister    Other Brother        mva  at age 51   Gout Brother    Hypertension Brother    Prostate cancer Maternal Uncle    Prostate cancer Maternal Uncle     Social History   Socioeconomic History   Marital status: Significant Other    Spouse name: Not on file   Number of children: Not on file   Years of education: Not on file   Highest education level: GED or equivalent  Occupational History   Not on file  Tobacco Use   Smoking status: Every Day    Current packs/day: 1.00    Average packs/day: 1 pack/day for 40.0 years (40.0 ttl pk-yrs)    Types: Cigarettes    Passive exposure: Current   Smokeless tobacco: Former  Types: Snuff, Chew    Quit date: 12/05/1984   Tobacco comments:    since age 62  Vaping Use   Vaping status: Never Used  Substance and Sexual Activity   Alcohol use: Not Currently    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   Drug use: Not Currently   Sexual activity: Yes  Other Topics Concern   Not on file  Social History Narrative   Not on file   Social Drivers of Health   Tobacco Use: High Risk (08/27/2024)   Patient History    Smoking Tobacco Use: Every Day    Smokeless Tobacco Use: Former    Passive Exposure: Current  Physicist, Medical Strain: Medium Risk (07/30/2024)   Overall Financial Resource Strain (CARDIA)    Difficulty of Paying Living Expenses: Somewhat hard  Food Insecurity: Patient Declined (07/30/2024)   Epic    Worried About Programme Researcher, Broadcasting/film/video in the Last Year: Patient declined    Barista in the Last Year: Patient declined  Transportation Needs: Unknown (07/30/2024)   Epic    Lack of Transportation (Medical): No    Lack of  Transportation (Non-Medical): Patient declined  Physical Activity: Inactive (07/30/2024)   Exercise Vital Sign    Days of Exercise per Week: 0 days    Minutes of Exercise per Session: Not on file  Stress: Stress Concern Present (07/30/2024)   [employer] of Occupational Health - Occupational Stress Questionnaire    Feeling of Stress: To some extent  Social Connections: Unknown (07/30/2024)   Social Connection and Isolation Panel    Frequency of Communication with Friends and Family: More than three times a week    Frequency of Social Gatherings with Friends and Family: Never    Attends Religious Services: Patient declined    Database Administrator or Organizations: Patient declined    Attends Banker Meetings: Not on file    Marital Status: Living with partner  Intimate Partner Violence: Not At Risk (04/03/2022)   Humiliation, Afraid, Rape, and Kick questionnaire    Fear of Current or Ex-Partner: No    Emotionally Abused: No    Physically Abused: No    Sexually Abused: No  Depression (PHQ2-9): Low Risk (08/27/2024)   Depression (PHQ2-9)    PHQ-2 Score: 0  Alcohol Screen: Low Risk (09/25/2023)   Alcohol Screen    Last Alcohol Screening Score (AUDIT): 0  Housing: High Risk (07/30/2024)   Epic    Unable to Pay for Housing in the Last Year: Yes    Number of Times Moved in the Last Year: 1    Homeless in the Last Year: Yes  Utilities: Low Risk (12/04/2023)   Received from [facility]   Utilities    Within the past 12 months, have you been unable to get utilities(heat, electricity) when it was really needed?: No  Health Literacy: Not on file    Review of Systems  Constitutional:  Positive for malaise/fatigue.  Respiratory:  Negative for wheezing.   Cardiovascular:  Negative for chest pain.  Gastrointestinal:  Negative for blood in stool and melena.      Objective    BP 122/74 (Cuff Size: Normal)   Pulse 100   Temp 97.9 F (36.6 C) (Oral)   Resp 16   Ht 5' 11  (1.803 m)   Wt 126 lb (57.2 kg)   SpO2 98%   BMI 17.57 kg/m   Physical Exam Constitutional:  Appearance: Normal appearance.  HENT:     Head: Normocephalic and atraumatic.     Mouth/Throat:     Mouth: Mucous membranes are moist.     Pharynx: Oropharynx is clear.  Eyes:     Conjunctiva/sclera: Conjunctivae normal.  Cardiovascular:     Rate and Rhythm: Normal rate and regular rhythm.  Pulmonary:     Effort: Pulmonary effort is normal.     Breath sounds: Wheezing present.  Skin:    General: Skin is warm and dry.  Neurological:     General: No focal deficit present.     Mental Status: He is alert. Mental status is at baseline.  Psychiatric:        Mood and Affect: Mood normal.        Behavior: Behavior normal.     Last CBC Lab Results  Component Value Date   WBC 2.9 (L) 07/31/2024   HGB 8.8 (L) 07/31/2024   HCT 28.5 (L) 07/31/2024   MCV 82.8 07/31/2024   MCH 25.6 (L) 07/31/2024   RDW 18.9 (H) 07/31/2024   PLT 241 07/31/2024   Last metabolic panel Lab Results  Component Value Date   GLUCOSE 142 (H) 07/31/2024   NA 135 07/31/2024   K 4.2 07/31/2024   CL 98 07/31/2024   CO2 29 07/31/2024   BUN 9 07/31/2024   CREATININE 0.62 (L) 07/31/2024   GFRNONAA >60 10/15/2022   CALCIUM  9.2 07/31/2024   PROT 6.7 07/31/2024   ALBUMIN 4.4 10/15/2022   BILITOT 0.3 07/31/2024   ALKPHOS 70 10/15/2022   AST 13 07/31/2024   ALT 16 07/31/2024   ANIONGAP 8 10/15/2022   Last lipids Lab Results  Component Value Date   CHOL 95 02/07/2024   HDL 54 02/07/2024   LDLCALC 29 02/07/2024   TRIG 50 02/07/2024   CHOLHDL 1.8 02/07/2024   Last hemoglobin A1c Lab Results  Component Value Date   HGBA1C 5.2 08/06/2024   Last thyroid  functions Lab Results  Component Value Date   TSH 0.978 07/05/2022   Last vitamin D  Lab Results  Component Value Date   VD25OH 125 (H) 02/07/2024   Last vitamin B12 and Folate Lab Results  Component Value Date   VITAMINB12 379 07/31/2024         Assessment & Plan:   Assessment & Plan  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 Anemia likely secondary to chemotherapy. No evidence of gastrointestinal bleeding as stool tests were negative. - Monitor hemoglobin levels regularly.  Chronic obstructive pulmonary disease Reports difficulty with Trelegy inhaler due to sinus issues and mucus buildup. Wheezing noted in the upper lungs. - Refilled albuterol  inhaler. - Continue saline nasal rinses to manage sinus issues. - Use nebulizer as needed for wheezing. - Encouraged regular use of Trelegy inhaler.  Hyperlipidemia - Stable, refill statin.   Gastroesophageal reflux disease Experiences heartburn if pantoprazole  is not taken in the evening. - Refilled pantoprazole  prescription.  Chronic gout Currently on medication for gout management. - Refilled gout medication.  - POCT Occult Blood Stool - albuterol  (VENTOLIN  HFA) 108 (90 Base) MCG/ACT inhaler; Inhale 1-2 puffs into the lungs every 6 (six) hours as needed for wheezing or shortness of breath.  Dispense: 1 each; Refill: 3 - Fluticasone -Umeclidin-Vilant (TRELEGY ELLIPTA ) 100-62.5-25 MCG/ACT AEPB; Inhale 1 puff into the lungs daily.  Dispense: 1 each; Refill: 11 - rosuvastatin  (CRESTOR ) 20 MG tablet; Take 1 tablet (20 mg total) by mouth daily.  Dispense: 90 tablet; Refill:  1 - pantoprazole  (PROTONIX ) 40 MG tablet; Take 1 tablet (40 mg total) by mouth daily.  Dispense: 90 tablet; Refill: 1 - allopurinol  (ZYLOPRIM ) 300 MG tablet; Take 1 tablet (300 mg total) by mouth daily.  Dispense: 90 tablet; Refill: 1   Return in about 6 months (around 02/24/2025) for can cancel 2/10 [address] SABRA [name], DO   "

## 2024-09-01 ENCOUNTER — Ambulatory Visit: Admitting: Internal Medicine

## 2025-01-20 ENCOUNTER — Other Ambulatory Visit

## 2025-01-27 ENCOUNTER — Ambulatory Visit: Admitting: Urology

## 2025-02-24 ENCOUNTER — Ambulatory Visit: Admitting: Internal Medicine
